# Patient Record
Sex: Female | Born: 1935 | Race: White | Hispanic: No | Smoking: Former smoker
Health system: Southern US, Community
[De-identification: ages and names within clinical notes are randomized; demographics above are authoritative.]

## PROBLEM LIST (undated history)

## (undated) DIAGNOSIS — T148XXA Other injury of unspecified body region, initial encounter: Secondary | ICD-10-CM

## (undated) DIAGNOSIS — I2510 Atherosclerotic heart disease of native coronary artery without angina pectoris: Secondary | ICD-10-CM

## (undated) DIAGNOSIS — I4821 Permanent atrial fibrillation: Secondary | ICD-10-CM

## (undated) DIAGNOSIS — D649 Anemia, unspecified: Secondary | ICD-10-CM

## (undated) DIAGNOSIS — Z9289 Personal history of other medical treatment: Secondary | ICD-10-CM

## (undated) DIAGNOSIS — H269 Unspecified cataract: Secondary | ICD-10-CM

## (undated) DIAGNOSIS — I10 Essential (primary) hypertension: Secondary | ICD-10-CM

## (undated) DIAGNOSIS — K219 Gastro-esophageal reflux disease without esophagitis: Secondary | ICD-10-CM

## (undated) DIAGNOSIS — E119 Type 2 diabetes mellitus without complications: Secondary | ICD-10-CM

## (undated) DIAGNOSIS — S62102A Fracture of unspecified carpal bone, left wrist, initial encounter for closed fracture: Secondary | ICD-10-CM

## (undated) DIAGNOSIS — Z950 Presence of cardiac pacemaker: Secondary | ICD-10-CM

## (undated) DIAGNOSIS — M1990 Unspecified osteoarthritis, unspecified site: Secondary | ICD-10-CM

## (undated) DIAGNOSIS — Z951 Presence of aortocoronary bypass graft: Secondary | ICD-10-CM

## (undated) DIAGNOSIS — I872 Venous insufficiency (chronic) (peripheral): Secondary | ICD-10-CM

## (undated) DIAGNOSIS — E785 Hyperlipidemia, unspecified: Secondary | ICD-10-CM

## (undated) DIAGNOSIS — N189 Chronic kidney disease, unspecified: Secondary | ICD-10-CM

## (undated) DIAGNOSIS — J9601 Acute respiratory failure with hypoxia: Secondary | ICD-10-CM

## (undated) DIAGNOSIS — I5033 Acute on chronic diastolic (congestive) heart failure: Secondary | ICD-10-CM

## (undated) DIAGNOSIS — E669 Obesity, unspecified: Secondary | ICD-10-CM

## (undated) HISTORY — PX: TOTAL KNEE ARTHROPLASTY: SHX125

## (undated) HISTORY — PX: APPENDECTOMY: SHX54

## (undated) HISTORY — DX: Atherosclerotic heart disease of native coronary artery without angina pectoris: I25.10

## (undated) HISTORY — DX: Anemia, unspecified: D64.9

## (undated) HISTORY — DX: Presence of aortocoronary bypass graft: Z95.1

## (undated) HISTORY — PX: BACK SURGERY: SHX140

## (undated) HISTORY — DX: Venous insufficiency (chronic) (peripheral): I87.2

## (undated) HISTORY — DX: Obesity, unspecified: E66.9

## (undated) HISTORY — DX: Permanent atrial fibrillation: I48.21

## (undated) HISTORY — PX: CATARACT EXTRACTION, BILATERAL: SHX1313

## (undated) HISTORY — DX: Chronic kidney disease, unspecified: N18.9

## (undated) HISTORY — DX: Type 2 diabetes mellitus without complications: E11.9

## (undated) HISTORY — PX: INSERT / REPLACE / REMOVE PACEMAKER: SUR710

## (undated) HISTORY — DX: Other injury of unspecified body region, initial encounter: T14.8XXA

## (undated) HISTORY — DX: Acute on chronic diastolic (congestive) heart failure: I50.33

## (undated) HISTORY — PX: TUBAL LIGATION: SHX77

## (undated) HISTORY — PX: JOINT REPLACEMENT: SHX530

## (undated) HISTORY — DX: Presence of cardiac pacemaker: Z95.0

## (undated) HISTORY — DX: Unspecified cataract: H26.9

## (undated) HISTORY — PX: TONSILLECTOMY: SUR1361

---

## 1997-07-31 DIAGNOSIS — E119 Type 2 diabetes mellitus without complications: Secondary | ICD-10-CM

## 1997-07-31 HISTORY — DX: Type 2 diabetes mellitus without complications: E11.9

## 2002-06-17 ENCOUNTER — Encounter: Payer: Self-pay | Admitting: Internal Medicine

## 2002-06-17 ENCOUNTER — Ambulatory Visit (HOSPITAL_COMMUNITY): Admission: RE | Admit: 2002-06-17 | Discharge: 2002-06-17 | Payer: Self-pay | Admitting: Internal Medicine

## 2002-06-20 ENCOUNTER — Ambulatory Visit (HOSPITAL_COMMUNITY): Admission: RE | Admit: 2002-06-20 | Discharge: 2002-06-20 | Payer: Self-pay | Admitting: Internal Medicine

## 2002-06-20 ENCOUNTER — Encounter: Payer: Self-pay | Admitting: Internal Medicine

## 2003-11-15 ENCOUNTER — Emergency Department (HOSPITAL_COMMUNITY): Admission: EM | Admit: 2003-11-15 | Discharge: 2003-11-15 | Payer: Self-pay | Admitting: Emergency Medicine

## 2003-11-15 ENCOUNTER — Inpatient Hospital Stay (HOSPITAL_COMMUNITY): Admission: AD | Admit: 2003-11-15 | Discharge: 2003-12-02 | Payer: Self-pay | Admitting: Cardiovascular Disease

## 2003-11-16 HISTORY — PX: CORONARY ANGIOPLASTY: SHX604

## 2006-06-26 ENCOUNTER — Inpatient Hospital Stay (HOSPITAL_COMMUNITY): Admission: AD | Admit: 2006-06-26 | Discharge: 2006-06-30 | Payer: Self-pay | Admitting: Internal Medicine

## 2006-06-26 ENCOUNTER — Ambulatory Visit (HOSPITAL_COMMUNITY): Admission: RE | Admit: 2006-06-26 | Discharge: 2006-06-26 | Payer: Self-pay | Admitting: Internal Medicine

## 2006-07-31 DIAGNOSIS — D649 Anemia, unspecified: Secondary | ICD-10-CM

## 2006-07-31 DIAGNOSIS — N189 Chronic kidney disease, unspecified: Secondary | ICD-10-CM

## 2006-07-31 HISTORY — DX: Chronic kidney disease, unspecified: N18.9

## 2006-07-31 HISTORY — DX: Anemia, unspecified: D64.9

## 2006-09-17 ENCOUNTER — Ambulatory Visit: Payer: Self-pay | Admitting: Pulmonary Disease

## 2006-09-17 ENCOUNTER — Encounter: Payer: Self-pay | Admitting: Emergency Medicine

## 2006-09-17 ENCOUNTER — Inpatient Hospital Stay (HOSPITAL_COMMUNITY): Admission: AD | Admit: 2006-09-17 | Discharge: 2006-09-25 | Payer: Self-pay | Admitting: Pulmonary Disease

## 2006-10-11 ENCOUNTER — Inpatient Hospital Stay (HOSPITAL_COMMUNITY): Admission: EM | Admit: 2006-10-11 | Discharge: 2006-10-17 | Payer: Self-pay | Admitting: Emergency Medicine

## 2007-05-13 ENCOUNTER — Inpatient Hospital Stay (HOSPITAL_COMMUNITY): Admission: RE | Admit: 2007-05-13 | Discharge: 2007-05-18 | Payer: Self-pay | Admitting: Orthopedic Surgery

## 2007-07-09 ENCOUNTER — Ambulatory Visit (HOSPITAL_COMMUNITY): Admission: RE | Admit: 2007-07-09 | Discharge: 2007-07-09 | Payer: Self-pay | Admitting: Ophthalmology

## 2007-07-11 IMAGING — CR DG CHEST 1V PORT
1 series · 1 of 1 positions shown · non-contrast
Comparison: none

HISTORY: CHF

[view not recorded]
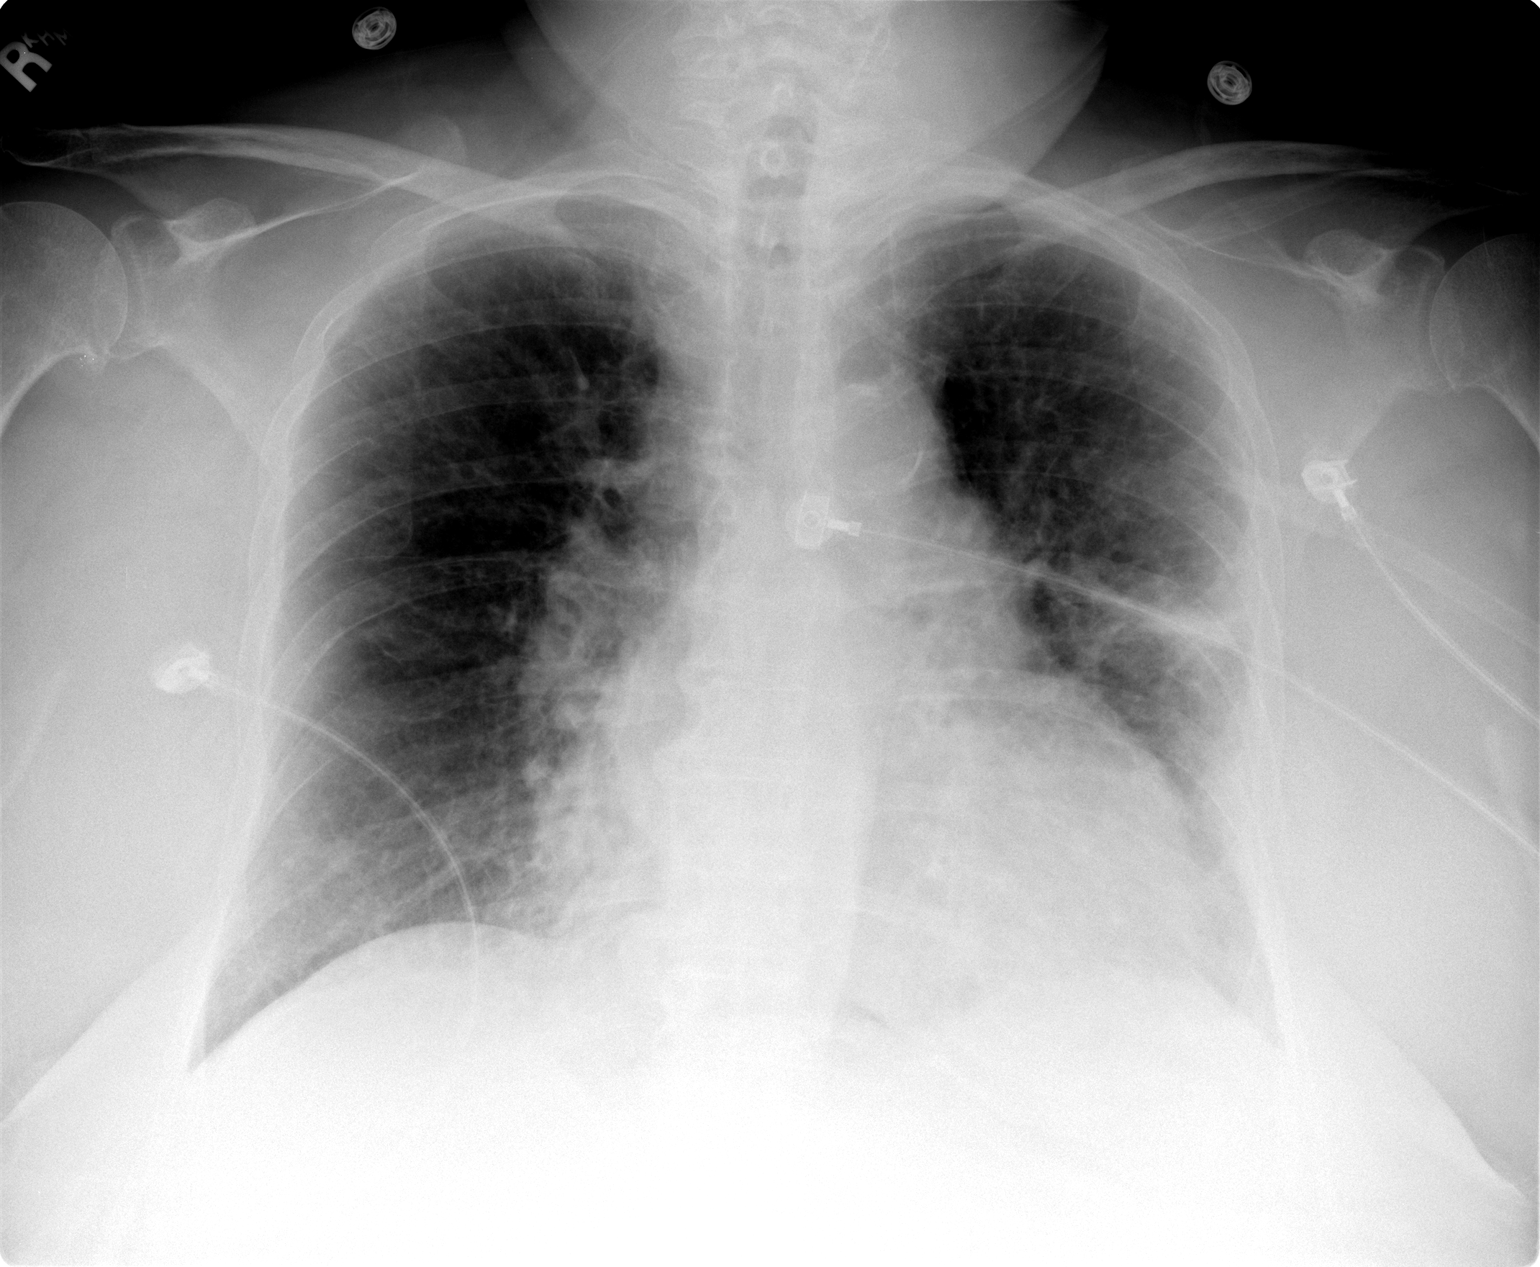

[1 of 1 positions shown; findings below may reference images not displayed]

PORTAL CHEST ONE VIEW:

Portable exam 3457 hours compared to 10/11/2006.

Heart enlargement.
Pulmonary vascular congestion.
Atelectasis left midlung.
Bronchitic changes with diffuse interstitial infiltrates, question minimal
edema.
No pleural effusion or pneumothorax.
Atherosclerotic calcification, aortic arch.
IMPRESSION: Question mild CHF.
Stable atelectasis left midlung.

## 2007-07-15 ENCOUNTER — Ambulatory Visit: Payer: Self-pay | Admitting: Internal Medicine

## 2007-07-15 ENCOUNTER — Inpatient Hospital Stay (HOSPITAL_COMMUNITY): Admission: EM | Admit: 2007-07-15 | Discharge: 2007-07-18 | Payer: Self-pay | Admitting: Emergency Medicine

## 2007-07-16 ENCOUNTER — Ambulatory Visit: Payer: Self-pay | Admitting: Internal Medicine

## 2007-08-08 HISTORY — PX: CARDIAC CATHETERIZATION: SHX172

## 2007-08-12 ENCOUNTER — Ambulatory Visit: Payer: Self-pay | Admitting: Thoracic Surgery (Cardiothoracic Vascular Surgery)

## 2007-08-28 ENCOUNTER — Emergency Department (HOSPITAL_COMMUNITY): Admission: EM | Admit: 2007-08-28 | Discharge: 2007-08-28 | Payer: Self-pay | Admitting: Emergency Medicine

## 2007-09-02 ENCOUNTER — Ambulatory Visit: Payer: Self-pay | Admitting: Thoracic Surgery (Cardiothoracic Vascular Surgery)

## 2007-09-02 ENCOUNTER — Encounter
Admission: RE | Admit: 2007-09-02 | Discharge: 2007-09-02 | Payer: Self-pay | Admitting: Thoracic Surgery (Cardiothoracic Vascular Surgery)

## 2007-10-07 ENCOUNTER — Inpatient Hospital Stay (HOSPITAL_COMMUNITY): Admission: EM | Admit: 2007-10-07 | Discharge: 2007-10-28 | Payer: Self-pay | Admitting: Emergency Medicine

## 2007-10-08 ENCOUNTER — Ambulatory Visit: Payer: Self-pay | Admitting: Thoracic Surgery (Cardiothoracic Vascular Surgery)

## 2007-10-08 ENCOUNTER — Encounter (INDEPENDENT_AMBULATORY_CARE_PROVIDER_SITE_OTHER): Payer: Self-pay | Admitting: Cardiovascular Disease

## 2007-10-17 ENCOUNTER — Encounter: Payer: Self-pay | Admitting: Thoracic Surgery (Cardiothoracic Vascular Surgery)

## 2007-10-24 DIAGNOSIS — Z951 Presence of aortocoronary bypass graft: Secondary | ICD-10-CM

## 2007-10-24 DIAGNOSIS — Z950 Presence of cardiac pacemaker: Secondary | ICD-10-CM

## 2007-10-24 HISTORY — DX: Presence of cardiac pacemaker: Z95.0

## 2007-10-24 HISTORY — PX: PACEMAKER INSERTION: SHX728

## 2007-10-24 HISTORY — PX: CORONARY ARTERY BYPASS GRAFT: SHX141

## 2007-10-24 HISTORY — DX: Presence of aortocoronary bypass graft: Z95.1

## 2007-10-28 ENCOUNTER — Inpatient Hospital Stay: Admission: AD | Admit: 2007-10-28 | Discharge: 2007-11-04 | Payer: Self-pay | Admitting: Internal Medicine

## 2007-11-02 ENCOUNTER — Emergency Department (HOSPITAL_COMMUNITY): Admission: EM | Admit: 2007-11-02 | Discharge: 2007-11-02 | Payer: Self-pay | Admitting: Emergency Medicine

## 2007-11-04 ENCOUNTER — Inpatient Hospital Stay (HOSPITAL_COMMUNITY): Admission: EM | Admit: 2007-11-04 | Discharge: 2007-11-14 | Payer: Self-pay | Admitting: Emergency Medicine

## 2007-11-04 ENCOUNTER — Ambulatory Visit: Payer: Self-pay | Admitting: Gastroenterology

## 2007-11-06 ENCOUNTER — Ambulatory Visit: Payer: Self-pay | Admitting: Gastroenterology

## 2007-11-07 ENCOUNTER — Ambulatory Visit: Payer: Self-pay | Admitting: Gastroenterology

## 2007-11-07 HISTORY — PX: ESOPHAGOGASTRODUODENOSCOPY: SHX1529

## 2007-11-14 ENCOUNTER — Inpatient Hospital Stay: Admission: AD | Admit: 2007-11-14 | Discharge: 2007-12-06 | Payer: Self-pay | Admitting: Internal Medicine

## 2007-11-19 ENCOUNTER — Encounter
Admission: RE | Admit: 2007-11-19 | Discharge: 2007-11-19 | Payer: Self-pay | Admitting: Thoracic Surgery (Cardiothoracic Vascular Surgery)

## 2007-11-19 ENCOUNTER — Ambulatory Visit: Payer: Self-pay | Admitting: Thoracic Surgery (Cardiothoracic Vascular Surgery)

## 2007-12-19 ENCOUNTER — Ambulatory Visit: Payer: Self-pay | Admitting: Thoracic Surgery (Cardiothoracic Vascular Surgery)

## 2007-12-30 ENCOUNTER — Ambulatory Visit (HOSPITAL_COMMUNITY): Admission: RE | Admit: 2007-12-30 | Discharge: 2007-12-30 | Payer: Self-pay | Admitting: Ophthalmology

## 2008-01-03 ENCOUNTER — Ambulatory Visit: Payer: Self-pay | Admitting: Internal Medicine

## 2008-01-09 ENCOUNTER — Ambulatory Visit: Payer: Self-pay | Admitting: Thoracic Surgery (Cardiothoracic Vascular Surgery)

## 2008-01-10 ENCOUNTER — Ambulatory Visit: Payer: Self-pay | Admitting: Gastroenterology

## 2008-08-09 IMAGING — CR DG CHEST 1V PORT
1 series · 1 of 1 positions shown · non-contrast
Comparison: Portable exam 4515 hours compared to 11/04/2007

CLINICAL DATA: Anemia, CHF

PORTABLE CHEST - 1 VIEW

[view not recorded]
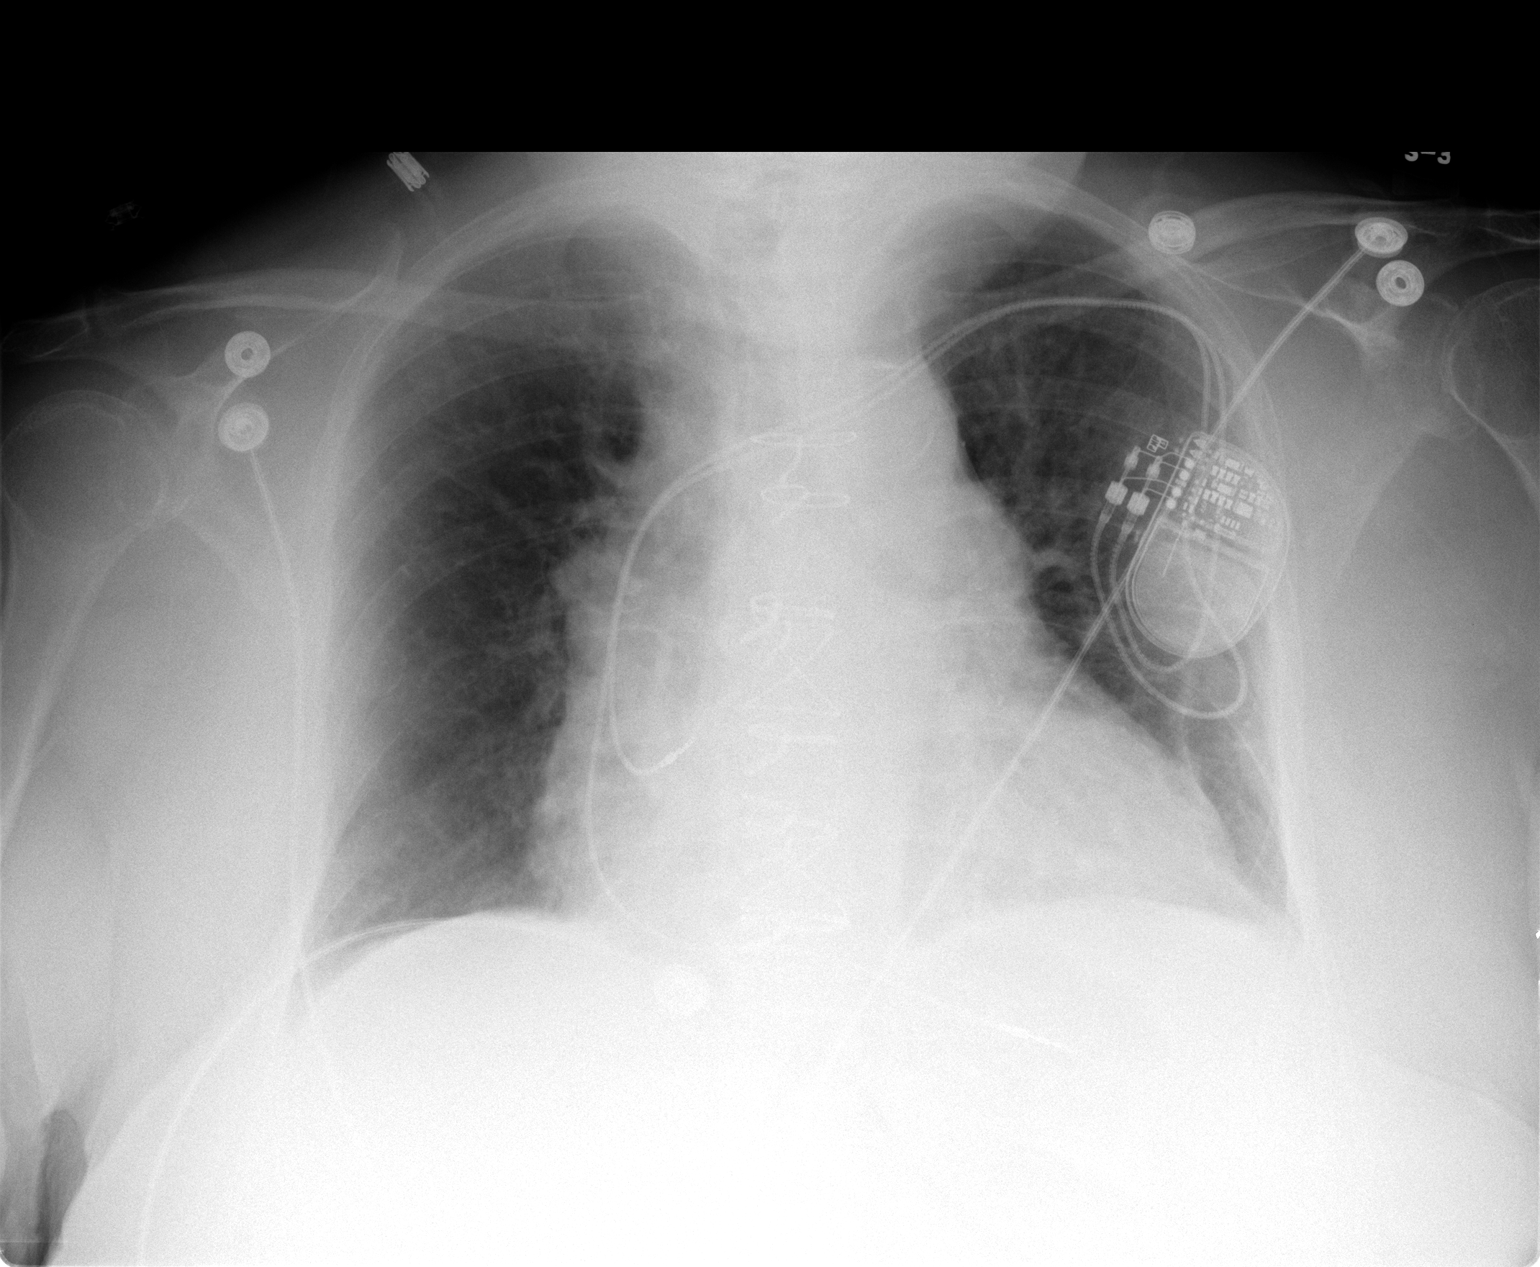

[1 of 1 positions shown; findings below may reference images not displayed]

FINDINGS: Cardiomegaly status post CABG.
Left subclavian transvenous pacemaker, leads stable, projecting
over right atrium and right ventricle.
Pulmonary vascular congestion.
Atherosclerotic calcification aortic arch.
No acute failure or consolidation.
Minimal atelectasis left base.
IMPRESSION: Cardiomegaly with pulmonary vascular congestion.
Status post CABG and pacemaker.
No definite acute pulmonary edema.

## 2008-12-01 ENCOUNTER — Ambulatory Visit (HOSPITAL_COMMUNITY): Admission: RE | Admit: 2008-12-01 | Discharge: 2008-12-01 | Payer: Self-pay | Admitting: Internal Medicine

## 2009-04-18 ENCOUNTER — Ambulatory Visit: Payer: Self-pay | Admitting: Cardiology

## 2009-07-31 HISTORY — PX: NM MYOCAR PERF WALL MOTION: HXRAD629

## 2010-08-01 ENCOUNTER — Ambulatory Visit (HOSPITAL_COMMUNITY)
Admission: RE | Admit: 2010-08-01 | Discharge: 2010-08-01 | Payer: Self-pay | Source: Home / Self Care | Attending: Internal Medicine | Admitting: Internal Medicine

## 2010-12-13 NOTE — Op Note (Signed)
NAMESURIE, SUCHOCKI              ACCOUNT NO.:  [id number]   MEDICAL RECORD NO.:  [id number]          PATIENT TYPE:  INP   LOCATION:  2020                         FACILITY:  [facility]   PHYSICIAN:  Darlin Priestly, MD  DATE OF BIRTH:  July 06, 1933   DATE OF PROCEDURE:  10/24/2007  DATE OF DISCHARGE:                               OPERATIVE REPORT   PROCEDURE:  Insertion of a Medtronic EnRhythm P1501 DR generator, serial  number [id number] with active atrial and ventricular leads.   ATTENDING:  Darlin Priestly, M.D.   COMPLICATIONS:  None.   INDICATION:  [name] is a 77 year old female patient of Dr. Franchot Heidelberg and Dr. Artis Delay with a history of hypertension, chronic  renal insufficiency, history of cardiac catheterization in January 2009  when she was found to have 3-vessel disease.  She ultimately went to  bypass surgery on October 17, 2007 by Dr. Andrey Spearman consisting of  a LIMA to the LAD, vein graft to the first diagonal, vein graft to OM,  and vein graft to distal RCA.  She did well postop, however, she  continued to require atrial pacing.  She did have some brief postop  atrial fibrillation.  Because of her need for ongoing atrial pacing, she  is now referred for dual-chamber pacemaker implant.   DESCRIPTION OF OPERATION:  After giving informed consent, the patient  was brought to the cardiac cath lab where her left chest was prepped and  draped in a sterile fashion.  Anesthesia monitoring was established.  One percent lidocaine was used to anesthetize the left mid subclavicular  area.  Next a 3 cm horizontal mid infraclavicular incision was then  carried out and hemostasis obtained with electrocautery.  Blunt  dissection was used to carry this down to the left pectoral fascia.  Next roughly a 3 x 4 cm pocket was then created over the left pectoral  fascia and again hemostasis was obtained with electrocautery.  The left  subclavian vein was then easily  entered x2 with 2 retained guidewires.  Four silk sutures were then placed at the base of the retained  guidewires into the pectoral fascia.  Over the first retained guidewire  a #7-French dilator and sheath were then easily passed in the subclavian  vein and the dilator and guidewire removed.  Through this a 52 cm active  Medtronic lead model number [id number], serial number [id number] was then  easily passed into the right atrium.  Peel-away sheath was removed.  Over the second retained guidewire a second 7-French dilator and sheath  then easily passed into subclavian vein.  The dilator and guide were  removed.  Through this a 45 cm active Medtronic lead, model number 5076,  serial number [id number] was then passed into the right atrium and peel-  away sheath was removed.  A J curve was then placed on the ventricular  lead stylet and the ventricular lead was then positioned in the RV apex.  Thresholds were then determined.  We were able to capture 2 volts and  the screw was then extended and thresholds were determined.  R-waves  were measured at 22.5 mV.  Impedance 798 ohms.  Threshold was measured  at 0.4 volts of 0.5 ms.  Current was 0.7 mA.  Ten volts was negative for  diaphragmatic stimulation.  The preformed atrial stylet was then  inserted into the atrial lead and we did do atrial mapping to find a  location for the atrial lead.  We were ultimately able to find a  suitable location with P-waves measuring approximately 3 mV.  The screw  was then extended and thresholds were determined.  P-waves were measured  at 3.2 mV.  Impedance 644 ohms.  Threshold 1 volt at 0.5 ms.  Current is  2.1 mA.  Ten volts was negative for diaphragmatic stimulation.  The  leads were then sutured into place with two silk sutures per lead,  anchoring this to the pectoral fascia.  The pocket was then copiously  irrigated with 1% kanamycin solution.  The leads were then connected in  a serial fashion to Medtronic  EnRhythm P1501 DR generator, serial number  [id number].  Head screws were tightened and pacing was confirmed.  A  single silk suture was then placed in the superior aspect of the pocket.  The generator and leads were then delivered into the pocket and the  header was secured to the silk suture.  Subcutaneous layer was then  closed with running 2-0 Vicryl.  The skin was closed with running 4-0  Vicryl.  Steri-Strips were then applied.  The patient was then  transferred to the recovery room in stable condition.   CONCLUSIONS:  Successful implant of a Medtronic EnRhythm [id number] DR  generator, serial number [id number] with active atrial and ventricular  leads.      Darlin Priestly, MD  Electronically Signed     RHM/MEDQ  D:  10/24/2007  T:  10/25/2007  Job:  [id number]   cc:   Madelin Rear. Sherwood Gambler, MD  Richard A. Alanda Amass, M.D.  Salvatore Decent Dorris Fetch, M.D.

## 2010-12-13 NOTE — Group Therapy Note (Signed)
NAMEGENNIFER, POTENZA              ACCOUNT NO.:  [id number]   MEDICAL RECORD NO.:  [id number]          PATIENT TYPE:  INP   LOCATION:  A220                          FACILITY:  [facility]   PHYSICIAN:  Gardiner Barefoot, MD    DATE OF BIRTH:  10/11/1932   DATE OF PROCEDURE:  11/08/2007  DATE OF DISCHARGE:                                 PROGRESS NOTE   SUBJECTIVE:  The patient continues to feel better.  Her heart rate has  remained stable.   PHYSICAL EXAM:  VITAL SIGNS:  Temperature is 97.6, pulse is 73 to 90,  blood pressure is 142/71, respirations 24, and O2 saturation is 96% on 1  L nasal cannula.  GENERAL:  The patient is awake, alert, and appears in no acute distress.  CARDIOVASCULAR:  Irregular rate with no murmurs, rubs, or gallops.  LUNGS:  Clear to auscultation bilaterally.  ABDOMEN:  Soft, nontender, nondistended, positive bowel sounds.  No  hepatosplenomegaly.  EXTREMITIES:  Right calf bandage from the surgery with some slight  serosanguineous drainage.   LABORATORY DATA:  BNP is 300 which is decreased from 470.  WBC is 7.4  which is decreased from 13 at admission.  Hemoglobin of 9.8.  BMET with  a creatinine of 1.64 which is increased from her admission.   ASSESSMENT AND PLAN:  1. Atrial fibrillation.  The patient is now rate controlled receiving      amiodarone as well as p.o. Lopressor and IV diltiazem.  I will      probably transition her to p.o. diltiazem or increase her p.o.      metoprolol as she weans off her IV drip.  Also, she is on Coumadin;      we will need to continue that.  2. Anemia.  She did receive some blood, and an EGD showed just mild      gastritis.  We will continue to follow her anemia.  3. Hypotension, resolved.  4. Acute renal insufficiency.  We appreciate nephrology input.      Creatinine has elevated mildly likely secondary to Lasix.  This has      been stopped, and now, the patient is being transitioned to      [facility].  5. Status post coronary  artery bypass grafting.  The patient will have      wound care take a look at her right leg.      Gardiner Barefoot, MD  Electronically Signed     RWC/MEDQ  D:  11/08/2007  T:  11/09/2007  Job:  [id number]

## 2010-12-13 NOTE — Consult Note (Signed)
[name], [name]              ACCOUNT NO.:  [id number]   MEDICAL RECORD NO.:  [id number]          PATIENT TYPE:  INP   LOCATION:  2904                         FACILITY:  [facility]   PHYSICIAN:  Salvatore Decent. Dorris Fetch, M.D.DATE OF BIRTH:  January 16, 1933   DATE OF CONSULTATION:  10/08/2007  DATE OF DISCHARGE:                                 CONSULTATION   REASON FOR CONSULTATION:  Three-vessel disease.   HISTORY OF PRESENT ILLNESS:  [name] is a 77 year old woman with a  history of coronary disease.  She was seen in consultation by Dr. Cornelius Moras  previously, but surgery was delayed on two occasions she now presents  with rapid atrial fibrillation and congestive heart failure.  Her main  complaint was weakness.   [name] is a 77 year old woman with a history of coronary disease  that dates back to November 06, 2003 when she was admitted with acute  coronary syndrome.  She had non drug-eluting stents placed in the right  coronary at that time.  She was also noted to be in atrial fibrillation  at that time.  She was anticoagulated initially, but her atrial  fibrillation resolved, and she did not continue on the Coumadin beyond  that.  She underwent diagnostic catheterization in January of this year  where she was found to have severe three-vessel coronary disease.  She  referred for coronary bypass grafting.  Her ejection fraction was  approximately 45% with inferior and anterolateral wall hypokinesis.  She  was seen in consultation by Dr. Cornelius Moras and advised to have coronary bypass  grafting.  However, she refused at that time because her husband was ill  and in the hospital.  She subsequently was seen back in the office in  February and scheduled for surgery at that time.  However, she developed  bronchitis, and surgery had to be postponed once again.  She now  presents back with complaint of generalized weakness and malaise.  She  was seen in the office by Dr. Susa Griffins yesterday and  was found  to be in rapid atrial fibrillation and congestive heart failure.  She  was admitted, started on heparin, as well as diltiazem for rate control  of her atrial fibrillation.  Her cardiac enzymes were negative.   Her past medical history is significant for:  1. Coronary artery disease.  2. Type 2 diabetes.  3. Hypertension.  4. Chronic renal failure stage IV.  5. Exogenous obesity.  6. Hyperlipidemia.  7. Remote transient ischemic attack 6 to 7 years ago.  8. Chronic obstructive pulmonary disease with a past history of two      packs per day smoking.  9. Sick sinus syndrome.  10.Hypothyroidism.  11.Obesity.   MEDICATIONS AT THE TIME OF ADMISSION:  1. Lantus 30 units daily.  2. Glipizide 10 mg b.i.d.  3. Lasix 40 mg b.i.d.  4. Potassium chloride 20 mEq daily.  5. Metoprolol ER 50 mg daily.  6. Hydralazine 50 mg 1 t.i.d.  7. Imdur 30 mg daily.  8. Clonidine 1 mcg b.i.d.  9. Simvastatin 40 mg daily.  10.Temazepam 30 mg  every night.  11.Synthroid 50 mcg daily.   She has no known drug allergies.   FAMILY HISTORY:  Noncontributory.   SOCIAL HISTORY:  She is married.  Her husband is in poor health.  She  does have a past history of heavy tobacco abuse about two packs a day  for 50 years.   REVIEW OF SYSTEMS:  The patient denies any chest pain but has had  swelling in her legs.  He had mild orthopnea.  Main complaint is  generalized weakness and malaise.  She has not had any recent stroke or  TIA-type symptoms.  She has gained weight over the past several weeks  after having lost many pounds over the past few months.  She had not  noted any change in the frequency of urination or the volume.   PHYSICAL EXAMINATION:  [name] is an obese, 77 year old female in no  acute distress.  Blood pressure 130/70, pulses 70-80 - atrial  fibrillation, respirations are 20.  GENERAL:  She is well-developed but obese.  NEUROLOGICALLY:  She is alert and oriented x3 with no focal  deficits.  HEENT EXAM:  She is wearing glasses, otherwise unremarkable.  NECK:  Is supple without thyromegaly, adenopathy, or bruits.  CARDIAC EXAM:  Is irregularly irregular.  There is no significant  murmur.  LUNGS:  Have crackles at the basis.  ABDOMEN:  Soft, nontender.  EXTREMITIES:  Are edematous, right greater than left.  She has well-  healed surgical scar in the right knee.  She has palpable pulses in her  right leg, faintly questionable palpable pulse in the left leg.   LABORATORY DATA:  White count 7.2, platelets 177, hematocrit 29.  Sodium  136, potassium 4.1, CO2 20, BUN and creatinine are 47 and 2.3, glucose  is 190 this morning.  PT was 15.5 with an INR of 1.2, PTT was 75,  heparin level 0.59.  The liver function testing within normal limits.  Albumin is 3.2.  CK was 70, MB 1.7, troponin 0.02.  Cholesterol 105, LDL  59, HDL 23, triglycerides 117.  Urinalysis is not yet been performed.  Her CEA level is pending.  BNP was 637.  Glycosylated hemoglobin 6.3.  TSH 2.37.   IMPRESSION:  [name] is a 77 year old woman with severe three-vessel  coronary disease.  She now presents with rapid atrial fibrillation and  congestive heart failure.  She was only mildly symptomatic with her main  complaint being weakness.  She needs coronary artery bypass grafting for  revascularization.  Atrial fibrillation raises an interesting question  of whether or not a concomitant Maze would be appropriate.  However,  given her advanced age and multiple comorbidities and recent worsening  of her renal function, I do not believe that the extra bypass time  inherent in the Maze procedure is justified given this is a relatively  acute onset short duration.  She did have an episode in the past that  was of short duration.   She is a high-risk surgical candidate due to COPD, renal failure,  diabetes, obesity, congestive heart failure, and a relatively recent  history of gastrointestinal  bleeding.   I would estimate her operative mortality in the 5-10% range.  She also  is at risk for stroke, MI, DVT, PE, bleeding, possible need for  transfusions, infections as well as other organ system dysfunction  including GI, respiratory, and renal dysfunction.  If she were to  develop postoperative renal failure which is a real possibility  requiring dialysis, her risk of other complications would increase  dramatically.  She understands and accepts these risks.  Her  medication regimen has been changed by Dr. Alanda Amass.  We will recheck  her creatinine in the morning with an eye towards coronary bypass  grafting later in the week, if the patient remains stable and hopefully  shows some improvement.  I will see the patient again tomorrow.      Salvatore Decent Dorris Fetch, M.D.  Electronically Signed     SCH/MEDQ  D:  10/08/2007  T:  10/09/2007  Job:  [id number]

## 2010-12-13 NOTE — Consult Note (Signed)
NAMEMODINE, OPPENHEIMER              ACCOUNT NO.:  [id number]   MEDICAL RECORD NO.:  [id number]          PATIENT TYPE:  INP   LOCATION:  A220                          FACILITY:  [facility]   PHYSICIAN:  Jorja Loa, M.D.DATE OF BIRTH:  Jul 05, 1933   DATE OF CONSULTATION:  11/05/2007  DATE OF DISCHARGE:                                 CONSULTATION   REASON FOR CONSULTATION:  Elevated BUN and creatinine.   [name] is a 77 year old Caucasian female who has history of COPD,  diabetes, and history of hypertension who presently came to the  emergency room with complaints of weakness.  When she was evaluated in  the emergency room the patient was found to have a significant low  hemoglobin of 7.9 and with hypertension admitted to the hospital for  further workup and management.  According to [name] she has been at  [facility] simply for coronary artery disease, CABG, and during  that time she was told she had problem with her kidneys and being  followed by [facility].  According to her the etiology of her  renal insufficiency is not clear, and she denies any previous history of  renal insufficiency.  According to the information she has her renal  function has been improving.  At this moment she seems to be feeling  better.  She denies any dizziness, lightheadedness.   PAST MEDICAL HISTORY:  She has history of COPD, history of recurrent  CHF, history of diabetes, history of hypertension, history of atrial  fibrillation, history of anemia, history of hypothyroidism, history of  dyslipidemia, history of obesity, history of pneumonia, renal  insufficiency, and history of TIA.   PAST SURGICAL HISTORY:  She has history of carotid arteries status post  CABG in March 2009, history of knee replacement, history of pacemaker  insertion, history of back surgery, history of appendectomy, and also  history of stent placement.   ALLERGIES:  No known allergy.   MEDICATIONS:   At this moment consists of:   1. Lasix 20 mg IV once a day.  2. Protonix 40 mg IV q.24 h.  3. She is getting normal saline at 110 cc/hour.  4. Tylenol and other medications are p.r.n. medications.  However, as      an outpatient has been on multiple medications including Synthroid,      glipizide, Norvasc, Catapres, Coumadin, Cozaar.   SOCIAL HISTORY:  No history of smoking or alcohol abuse.  Recently sent  to [facility] for rehabilitation.   FAMILY HISTORY:  No history of renal insufficiency.   REVIEW OF SYSTEMS:  Complaints of some weakness; otherwise, states okay.  She does not have any nausea, vomiting or dizziness or lightheadedness.  She does not have any chest pain.  She says that she has swelling of the  legs for some time.  According to her the swelling seems to be also  improving.   PHYSICAL EXAMINATION:  GENERAL:  The patient is alert, sitting up in no  apparent distress.  VITAL SIGNS:  Temperature 97.8, pulse 138, blood pressure 170/100,  respiratory rate 20.  HEENT:  No conjunctivae, no icterus.  NECK:  Supple, no JVD.  CHEST:  Clear to auscultation, no rales, no rhonchi, no egophony.  HEART:  Reveals regular rate and rhythm, no murmur, no S3.  ABDOMEN:  Soft, no palpable organomegaly, positive bowel sounds.  EXTREMITIES:  She has probably 2 to 3+ edema.   LABORATORY DATA:  Last shift, urine output is  about 800.  White blood  cell count 8.1, hemoglobin two days 9.6, hematocrit 28.7,  28.8,  yesterday hemoglobin was as low as 7.7 and hematocrit 22.7.  INR 1.2, PT  is 16.  Sodium 132, potassium 3.9, BUN 64, creatinine 1.91.  On March 31  when she was discharged from [facility] her creatinine was about as low  as 1.3, and she had another blood work April 4.  Creatinine was 0.87.  Calcium 8.6.  Phosphorus 5.2.  At this moment no albumin.  She has iron  studies which were done.  Iron saturation is 6%.  At this moment  ferritin is not available.  During her previous  workup she had also PTH  done which was 164.9.  This is from March 11 when the culture was 8.7.  She had protein electrophoresis and spike was not detected, but she has  an elevated alpha 2 protein.  Otherwise, no additional information is  available.   She had an ultrasound which was done during that time which showed right  kidney to be 11.8, left kidney 11.4 with some decreased in cortical  medullary differentiation and with some atrophy.   ASSESSMENT:  1. Renal insufficiency.  At this moment seems to be probably acute on      chronic.  The etiology of her renal insufficiency not clear, but in      a patient with coronary artery disease status post CABG, ischemic      nephropathy needs to be entertained and most likely etiology.      Since patient has longstanding history of diabetes, hypertension,      hypertensive nephrosclerosis and diabetic nephropathy also need to      be entertained.  Since the patient has elevated phosphorus,      elevated intact PTH with atrophic kidney last creatinine which was      normal probably may be an error.  However, since she has      significant anasarca hemodilutionalso __________ with low      creatinine during her recent evaluation; hence probably patient      might have essentially chronic renal insufficiency.  2. Anasarca very significant __________ probably hypoalbuminemia.  May      place on __________ and also probably from her cardiac  problem as      patient has history of congestive heart failure.  3. Hypertension.  The patient on multiple medications as an      outpatient.  Her blood pressure at this point seems to be stable.  4. History of diabetes.  She is on insulin.  5. History of chronic obstructive pulmonary disease all in all stable.  6. History of anemia.  Seems to be secondary to iron deficiency      anemia.  Patient is  not on iron .  Hemoglobin and hematocrit have      dropped.  She is status post blood transfusion.  7.  History of atrial fibrillation.  8. History of hypothyroidism.  She is possibly on Synthroid.  9. History of coronary artery disease status post CABG, and also  she      has pacemaker in place.  10.History of transient ischemic attack.   RECOMMENDATIONS:  At this moment will increase her IV Lasix to improve  her urine output since she has significant edema.  Will check also her  albumin.  Probably patient may benefit from being started on iron  supplement.  If etiology for her iron deficiency anemia is not clear  checking also for guaiac and probably doing  either colonoscopy or an upper GI may be reasonable.  We will continue  with __________ and will continue the other treatment as soon as patient  has ultrasound and her previous workup and no further blood workup is  required and once the condition improves patient may need to be returned  to her nephrologist.      Jorja Loa, M.D.  Electronically Signed     BB/MEDQ  D:  11/05/2007  T:  11/05/2007  Job:  [id number]

## 2010-12-13 NOTE — Discharge Summary (Signed)
[name], [name]              ACCOUNT NO.:  [id number]   MEDICAL RECORD NO.:  [id number]          PATIENT TYPE:  INP   LOCATION:  2020                         FACILITY:  [facility]   PHYSICIAN:  [name] Decent. Dorris [name], M.D.DATE OF BIRTH:  [birth year]   DATE OF ADMISSION:  10/07/2007  DATE OF DISCHARGE:  10/28/2007                               DISCHARGE SUMMARY   ADDENDUM:   [name] is scheduled for transfer to [name]  today.  Since her previously dictated discharge summary, she has had  some medication changes.  Her blood pressure has been trending upward  and she has been seen by Cardiology with titration of her medications.  She has also been started on low-dose Coumadin for history of atrial  fibrillation and her INR at the time of discharge is 1.1.  She is  otherwise doing very well.  Morning labs on postop day #11 show  hemoglobin 9.1, hematocrit 27.4, white count 8.9, platelets 302,000,  sodium 141, potassium 3.8, BUN 27, creatinine 1.3, PT 14.1, INR 1.1.  Her incisions are healing well and every other leg staple has been  discontinued.  She is otherwise stable and ready for transfer today.   DISCHARGE MEDICATIONS:  Unchanged from the previously dictated discharge  summary with the exception of these additions:  1. Avapro 150 mg daily.  2. Hydrochlorothiazide 12.5 mg one-half tablet daily.  3. Coumadin 2.5 mg daily.  4. Lopressor 25 mg b.i.d.  5. The clonidine has been discontinued.   The rest or medications are unchanged.   ADDITIONAL DISCHARGE INSTRUCTIONS:  The remainder of her leg staples are  to be discontinued in 1 week.  She will also need a PT and INR drawn  within 48 hours of her admission with the results to be called to  [name] for management of her anticoagulation;  this is extremely important, as she does not  need to have a supratherapeutic INR, since she is status post recent  surgery.  Her INR should be  around 2 to 2.5.  The remainder of her  discharge instructions and followups are unchanged from the previously  dictated discharge summary.      [name], P.A.      [name], M.D.  Electronically Signed    GC/MEDQ  D:  10/28/2007  T:  10/28/2007  Job:  [id number]   cc:   [name], M.D.  [name]

## 2010-12-13 NOTE — Op Note (Signed)
[name], [name]              ACCOUNT NO.:  [id number]   MEDICAL RECORD NO.:  [id number]          PATIENT TYPE:  INP   LOCATION:  2899                         FACILITY:  [facility]   PHYSICIAN:  Mila Homer. Sherlean Foot, M.D. DATE OF BIRTH:  Mar 23, 1933   DATE OF PROCEDURE:  05/13/2007  DATE OF DISCHARGE:                               OPERATIVE REPORT   SURGEON:  Mila Homer. Sherlean Foot, M.D.   ASSISTANT:  Arnoldo Morale, PA.   ANESTHESIA:  General.   PREOPERATIVE DIAGNOSIS:  Right knee osteoarthritis.   POSTOPERATIVE DIAGNOSIS:  Right knee osteoarthritis.   PROCEDURE:  Right total knee arthroplasty.   INDICATIONS FOR PROCEDURE:  The patient is 42, with failure of  conservative measures for osteoarthritis of the right knee.  Informed  consent was obtained.   DESCRIPTION OF PROCEDURE:  The patient was laid supine and administered  general anesthesia.  The right leg was prepped and draped in usual  sterile fashion.  The leg was exsanguinated with the Esmarch tourniquet  and inflated to 350 mmHg, and set for an hour.  I then placed the leg in  extension, made a midline incision approximately 7 inches in length with  a #10 blade.  I Used a fresh blade to make a median parapatellar  arthrotomy and perform a synovectomy.  I elevated the deep MCL off the  medial crest of the tibia, around the semi-membranosus tendon.  I then  went into flexion, everted the patella through the measured 23 mm thick.  I reamed down to 16 mm.  I drilled through lug holes and when the  prosthetic trial in place, we created the 23 mm thickness.  I then used  the extramedullary alignment system on the tibia to make a perpendicular  cut to the anatomic axis of the tibia.  I then used the intramedullary  guide on the femur with the distal femoral guide set on 6 degrees, and  made the distal femoral cut with a sagittal saw.  I sized to a size E on  the femur, pinned it in 3 degrees of external rotation, and made the  anterior,  posterior and chamfer cuts through the 4-n-1 cutting block.  I  then placed a lamina spreader in the knee; cleaned out the ACL, PCL,  medial and lateral menisci, posterior condylar osteophytes.  I then  placed a 12 mm spacer block in the knee and had good flexion/extension  gap balance.  I finished the femur with a size E finishing block; placed  the tibia with a size 4 tibial tray drilling keel.  I then trialed with  a size 4 tibia, size E femur, 12 insert and 32 patella.  I had good  flexion/extension gap balance.  I dropped and dangled back to 120  degrees with limited only __________  ratio.  The patella tracked well.   I then removed the trial components and irrigated copiously.  I cemented  in the components, removing excess cement and allowing the cement to  harden in extension.  I placed a Hemovac coming out through  superolaterally deep to the arthrotomy, and  a pain catheter coming out  superomedially and superficial to the arthrotomy.  I also infiltrated  the capsule with 20 mL of 0.25% Marcaine with epinephrine and morphine  mixture.  I then closed the arthrotomy with figure-of-eight #1 Vicryl  sutures.  After the cement was hardened the  tourniquet was deflated and  hemostasis obtained.  I closed the deep soft tissues with buried 0  Vicryl, the subcuticular with 3-0 Vicryl stitch and skin staples.  I  then dressed with Xeroform dressing sponges, sterile Webril and TED  stocking.   COMPLICATIONS:  None.   DRAINS:  None.           ______________________________  Mila Homer. Sherlean Foot, M.D.     SDL/MEDQ  D:  05/13/2007  T:  05/13/2007  Job:  [id number]

## 2010-12-13 NOTE — H&P (Signed)
NAME:  [name], [name]              ACCOUNT NO.:  [id number]   MEDICAL RECORD NO.:  [id number]          PATIENT TYPE:  AMB   LOCATION:  DAY                           FACILITY:  [facility]   PHYSICIAN:  R. Roetta Sessions, M.D. DATE OF BIRTH:  1933/02/15   DATE OF ADMISSION:  01/03/2008  DATE OF DISCHARGE:  LH                              HISTORY & PHYSICAL   CHIEF COMPLAINT:  Microcytic anemia.  No prior colonoscopy.   [name] is a pleasant 77 year old Caucasian female with  multiple medical problems who has had a microcytic anemia for several  months now although as far as I can tell she has never been documented  to have Hemoccult-positive stools and clinically has not had any  bleeding.  She has been transfused on multiple occasions.  Earlier this  year she was found to have tight multivessel coronary artery disease by  Dr. Alanda Amass and subsequently underwent coronary bypass grafting back  in March.  She subsequently underwent a pacemaker placement.   She has been maintained on Coumadin for atrial fibrillation.  She got  through those procedures well.  She was admitted to [facility]  back in early April with acute-on-chronic anemia.  She was seen by Dr.  Cira Servant.  She did undergo an EGD during the hospitalization, which  revealed a noncritical Schatzki's ring and pretty much otherwise normal  upper GI tract.  H. pylori serologies were ordered due to erythema  present in the stomach, but I do not have those results on the chart for  review at this time.  She has never had a colonoscopy.  I spoke to Dr.  Alanda Amass prior to her CABG and we were going to get her revascularized  and then bring her back at some point postop for her first-ever  colonoscopy.  She has had multiple Hemoccult-negative stools.  There is  no family history of colorectal neoplasia or polyps.  She has occasional  constipation, for which she takes MiraLax at bedtime.   PAST MEDICAL HISTORY:  Type 2  insulin-dependent diabetes mellitus for 10  years, has bilateral cataracts, atrial fib requiring pacemaker March  2009.   PAST SURGERIES:  Back surgery, CABG, appendectomy, right knee  replacement October or November 2008.   HOME MEDICATIONS:  Glipizide 10 mg daily, levothyroxine 125 mcg daily,  Crestor 10 mg daily, Lexapro 10 mg daily, ASA 325 mg daily, Prilosec 20  mg daily, Coumadin 5.5 mg daily, Colace 100 mg daily, amiodarone 400 mg  daily, hydralazine 25 mg t.i.d., Lopressor 75 mg b.i.d., MiraLax 17  grams every other day, K-Dur 20 mEq twice daily, Zaroxolyn 5 mg b.i.d.,  Lantus 23 units in the morning, Diltiazem 120 mg daily, Demadex 60 mg  b.i.d., Nu-Iron 150 mg daily, NovoLog sliding scale   ALLERGIES:  No known drug allergies.   FAMILY HISTORY:  No chronic GI or liver illness.  No history of colon  cancer.   SOCIAL HISTORY:  Patient married for 53 years, 1 child, mentally  challenged, lives at home.  No tobacco or alcohol.   REVIEW OF SYSTEMS:  No recent chest pain, dyspnea on exertion.  No fever  or chills.   PHYSICAL EXAMINATION:  GENERAL:  A pleasant 75-year lady, resting  comfortably.  VITAL SIGNS:  Weight 217, height 5'4, temperature 98, blood pressure  112/78, pulse 64.  SKIN:  Warm and dry.  There is no jaundice.  HEENT:  No scleral icterus.  CHEST:  Lungs are clear to auscultation.  CARDIAC:  Regular rate and rhythm without murmur, gallop or rub.  ABDOMEN:  Obese.  Positive bowel sounds.  Soft, nontender.  EXTREMITIES:  Trace lower extremity edema.   IMPRESSION:  [name] is a pleasant 77 year old lady who has a  notable microcytic anemia, has required multiple transfusions over the  past several months but has yet to be documented to be Hemoccult  positive.  She has never had a colonoscopy.  Findings of recent EGD are  reassuring.   I feel she ought to get over her cataract surgery by a couple more weeks  and then we will go ahead and set her  up for a colonoscopy.  We  discussed this approach with [name].  Risks, benefits and  alternatives have been reviewed, questions answered.  She came off  Coumadin for 3 days for her cataract surgery and did well.  I will  actually plan to go ahead and stop her Coumadin for 3 days prior to  colonoscopy as well.  This approach has been discussed with the patient.  We will also go ahead and give her a single dose of prophylactic  antibiotics prior to the colonoscopy given that her prosthetic knee is  less than 30 year old.  We will make further recommendations in the very  near future.      Jonathon Bellows, M.D.  Electronically Signed     RMR/MEDQ  D:  01/03/2008  T:  01/03/2008  Job:  [id number]   cc:   Gerlene Burdock A. Alanda Amass, M.D.  Fax: [phone]   Madelin Rear. Sherwood Gambler, MD  Fax: [phone]

## 2010-12-13 NOTE — Assessment & Plan Note (Signed)
OFFICE VISIT   [name], [name]  DOB:  08/01/1932                                        January 09, 2008  CHART #:  [id number]   REASON FOR FOLLOWUP:  Followup of leg wound.   HISTORY OF PRESENT ILLNESS:  This patient is a 77 year old woman who had  coronary bypass graft back on 10/17/2007.  She developed a wound  infection in her right leg at the saphenous vein site.  This was open  and then packed.  She was last seen in the office about 3 weeks ago and  she was still requiring packing, although the infection resolved.  She  says it has been getting smaller and had been less and less packing in  with each visit of the home health nurse.  She is not having any pain in  the area, and otherwise, she is doing well.   PHYSICAL EXAMINATION:  Her leg wound has a small opening only 2-3 mm in  diameter.  A Q-tip did go in approximately 7-8 mm.  The wound was  packed.   PLANS:  She will continue with packings.  Continue with the home health  nurse and then she will call us back if this thing has not healed in  about a month or so.   Salvatore Decent Dorris Fetch, M.D.  Electronically Signed   SCH/MEDQ  D:  01/09/2008  T:  01/10/2008  Job:  [id number]

## 2010-12-13 NOTE — Assessment & Plan Note (Signed)
OFFICE VISIT   [name], [name]  DOB:  [birth year]                                        November 19, 2007  CHART #:  [id number]   HISTORY OF PRESENT ILLNESS:  The patient is status post coronary artery  bypass grafting x4 10/17/07.  The patient's postoperative course was  complicated with atrial fibrillation as well as sick sinus syndrome and  acute on chronic anemia.  The patient eventually undergoing placement of  total chamber permanent pacemaker on 10/24/07 by Dr. Shirlee Latch.  Patient  wants to receive transfusion while in the hospital.  She eventually  stabilized, started on Coumadin therapeutic and was ready for discharge  home.  The patient has not felt care at home was sufficient and requires  skilled nursing facility.  [facility] in [place] was arranged  and the patient was transferred there 10/28/07.  She presents today for  her postoperative visit.  The patient is still in [facility]  in [place].  She states that she had to be readmitted to [facility] 2  weeks ago due to acute on chronic anemia.  She received a transfusion at  that time.  She also states during this admission she was told that she  would need to see a kidney doctor as an outpatient for further  management.  No further details were available.  She was discharged back  to [facility] about a week and half ago.  The patient feels  that she is doing well.  She denies any sternal pain shortness of  breath, nausea, vomiting.  She states she is ambulating 2-3 x per day.  Her appetite is progressing well.  There is a small amount of serous  drainage from her distal right EVH site.  She had been started on  ciprofloxacin for this and dressing changes are being done.   PHYSICAL EXAM:  VITALS:  Blood pressure 132/76, pulse is 62.  Respirations 18, O2 sat 95% on room air.  RESPIRATORY:  The patient has crackles on bilateral bases.  CARDIAC:  Irregularly  irregular.  ABDOMEN:  Soft.  Bowel sounds x4.  EXTREMITIES:  Significant amount of edema noted bilateral peripheral  lower extremities.  She does have some serous drainage from her distal  right incision site.  Small amount of erythema surrounding.   The patient had PMI and chest x-ray done today which is clear.   IMPRESSION AND PLAN:  The patient was seen and evaluated by Dr.  Dorris Fetch.  Still the patient is progressing well but no significant  edema.  Recommend decreasing her Diltiazem to 120 mg daily and  increasing her Demadex to 60 mg twice a day.  The patient is told to  keep legs elevated while sitting and laying.  She is to continue wearing  her TED hose.  The patient is to continue ambulating 3-4 times per day.  Will plan to see her back in 3 weeks.  In the meantime, she does have a  follow-up appointment with Dr. Alanda Amass on May 18.   Salvatore Decent Dorris Fetch, M.D.  Electronically Signed   KMD/MEDQ  D:  11/19/2007  T:  11/19/2007  Job:  [id number]   cc:   Theda Belfast, PA  Salvatore Decent Dorris Fetch, M.D.

## 2010-12-13 NOTE — Group Therapy Note (Signed)
[name], [name]              ACCOUNT NO.:  [id number]   MEDICAL RECORD NO.:  [id number]          PATIENT TYPE:  INP   LOCATION:  A220                          FACILITY:  [facility]   PHYSICIAN:  Gardiner Barefoot, MD    DATE OF BIRTH:  03-07-1933   DATE OF PROCEDURE:  11/09/2007  DATE OF DISCHARGE:                                 PROGRESS NOTE   SUBJECTIVE:  The patient reports she continues to feel better.  The  heart rate has remained stable.   OBJECTIVE:  VITAL SIGNS:  Temperature is 97.5, pulses ranged from 58-90,  respirations 20, blood pressures ranged from 131-143/64-74.  O2  saturations 95% on 1 liter.  GENERAL:  The patient is awake and alert, and oriented x3, and appears  in no acute distress.  CARDIOVASCULAR:  Irregularly irregular, with no murmurs, rubs, or  gallops appreciated.  LUNGS:  Clear to auscultation bilaterally.  ABDOMEN:  Soft, nontender, nondistended, with positive bowel sounds, and  no hepatosplenomegaly.  Obese.  EXTREMITIES:  Right calf surgical wound with serosanguineous drainage.  Otherwise, nontender, and no erythema.   LABORATORY DATA:  INR is 1.2.  Sodium 139, potassium 3.6, chloride 109,  bicarbonate 23, glucose 165, BUN 36, and creatinine is 1.62.   ASSESSMENT AND PLAN:  1. Atrial fibrillation.  The patient is being anticoagulated for      cardiology with heparin drip and Coumadin per protocol.  Otherwise,      her ventricular rate has remained stable, with a stable pulse.  At      this time, I think she can wean from the Cardizem drip, and we will      increase her metoprolol dose from 25 to 37.5 mg q.8 h., and wean      her drip as tolerated for her heart rate.  However, we will restart      the drip if she begins to have any significant tachycardia.  2. Acute renal insufficiency.  Creatinine today is stable, and per      nephrology the patient has been started on metolazone 2.5 mg.  The      patient's Lasix for diuresis has been stopped, and  the patient was      started on metolazone and Demadex.  3. Coronary artery disease.  The patient is status post CABG and is      otherwise stable.  We will continue with her medications.  4. Leg wound.  There are no signs at this time that the leg is      infected.  Although there is a wound culture with a positive      organism, this likely does not necessarily represent an infection.      The patient has been afebrile, and otherwise the leg has been      stable.  However if she continues to have drainage, we will relook      at the leg.      Gardiner Barefoot, MD  Electronically Signed    RWC/MEDQ  D:  11/09/2007  T:  11/09/2007  Job:  [id number]

## 2010-12-13 NOTE — Consult Note (Signed)
[name], ANGE              ACCOUNT NO.:  [id number]   MEDICAL RECORD NO.:  [id number]          PATIENT TYPE:  INP   LOCATION:  2904                         FACILITY:  [facility]   PHYSICIAN:  Terrial Rhodes, M.D.DATE OF BIRTH:  1933/05/17   DATE OF CONSULTATION:  10/08/2007  DATE OF DISCHARGE:                                 CONSULTATION   REASON FOR CONSULTATION:  Abnormal creatinine value.   Ms. Hoppe is a 77 year old white female with atrial fibrillation ,  chronic kidney disease, hypertension, hyperlipidemia, hypothyroidism,  admitted to [facility] for control of atrial fib and evaluation for  CABG.  Her labs today revealed she had an increase in baseline  creatinine with a history of creatinine trending up.  She denied nausea,  vomiting, diarrhea, shortness of breath, chest pain, neuralgia, fever,  chills, back pain, dysuria, frequency, or urgency.  No hematuria.  No  abdominal pain.  She states that she had urinated twice this a.m.  without difficulty.  She is eating okay, and at the time of consult she  was not receiving IV fluids, but had IV on.   PAST MEDICAL HISTORY:  Significant for coronary artery disease with  stent placement, hypertension, systemic, with normal renal arteries x10  years, diabetes type 2 x10 years with questionable retinopathy, chronic  kidney insufficiency, hyperlipidemia, TIAs 6-7 years ago without  recurrence, stable COPD, hypothyroidism, sick sinus syndrome with  recurrent AFib with rate-related heart failure, history of anemia and  knee replacement in October 2008.   SOCIAL HISTORY:  She lives with her family.  She has a husband and 1  child that she lives with.  She is married, has 4 children.  She quit  smoking in 2001.  No alcohol use, no drug use.   FAMILY HISTORY:  Mother is deceased from Hodgkin's lymphoma.  Father is  deceased from a stroke in his 16s, and she has 4 healthy siblings.   REVIEW OF SYSTEMS:  Significant for cataract  in both eyes, dyspnea on  exertion, orthopnea, edema in the right leg a little bit more than the  left, and claudication.  All other systems were negative.   MEDICATIONS:  At home she takes Lantus 30 mg daily,  Glipizide 10 mg  b.i.d., Lasix 40 mg b.i.d., Toprol ER 50 mg daily, potassium chloride 20  mEq daily, Hydralazine 50 mg t.i.d., Isosorbide dinitrate 1/2 tab daily,  clonidine hydrochloride 0.1 mg b.i.d., Simvastatin 40 mg daily,  temazepam 30 mg at night, Synthroid 80 daily.   In the hospital she is on Cardizem 10 mg IV, Hydralazine 50 mg, Lexapro  10 mg, aspirin 355 mg, Simvastatin 40 mg, Glipizide 10 mg b.i.d.,  Protonix 20 mEq b.i.d., metoprolol 25 mg t.i.d., heparin protocol, and  Lasix 40 mg once daily.   PHYSICAL EXAMINATION:  Her temperature was 97.3.  Her pulse is 98.  Respiratory rate 22.  Blood pressure 134/72.  O2 sat was 99% on nasal  cannula 2 liters.  Weight is 103.5 kilograms.  Height is 64 inches.  GENERAL:  She is a well-developed, well-nourished, obese 77 year old  white female  resting, in no acute distress.  HEENT:  Normocephalic, atraumatic.  EOMI.  Sclerae clear.  NECK:  Supple without lymphadenopathy.  No JVD, no bruits.  No  lymphadenopathy.  CARDIOVASCULAR:  Pulses were 2+ and equal without bruits.  Heart rate  was regular rate and rhythm.  S1 and S2.  LUNGS:  Clear to auscultation without wheezes, rales, or rhonchi.  She  did have decreased lung sounds at the bases.  She had shortness of  breath with exam when sitting, and moving around.  SKIN:  Her skin was free of rashes and lesions.  GASTROINTESTINAL:  Abdomen soft and nontender.  Normal bowel sounds.  No  rebounding.  No pain on palpation, and no hepatosplenomegaly.  No CVA  tenderness.  EXTREMITIES:  No cyanosis, no rashes, no petechiae, 2+ edema.  Pedal in  her right greater than her left, up to her mid calf.  She had right knee  replacement scar on her right side.  No joint effusions, no  spinal  tenderness.  NEUROLOGIC:  Alert and oriented x3.  Cranial nerves 2-12 grossly intact,  and no asterixis.   LABORATORY DATA:  White count 7.2, hemoglobin 10.0, hematocrit 29.9,  platelets 177, sodium 136, potassium 4.1, chloride 106, CO2 20, BUN 47,  creatinine 2.27.  Glucose 190.  She has a baseline creatinine of 1.79 on  August 09, 2007.  Has been trending up since December 2008.  To look  back for 2 years, it has been fluctuating.  There is a uric acid of  14.4, hemoglobin A1c 6.3, calcium 8.7.   ASSESSMENT AND PLAN:  The patient is a 77 year old white female with CAD  and atrial fibrillation with chronic kidney disease, hypertension,  hyperlipidemia, and hypothyroidism.  Admitted for control of atrial  fibrillation and evaluation for coronary artery bypass graft with  increasing creatinine values.   1. Acute chronic renal failure with creatinine 20, upward with active      diuresis.  Creatinine has fluctuated over the past year and a half      from 1.3 to 2.3 mainly with episodes of congestive heart failure      exacerbation.  2. Underlying chronic kidney disease, stage 3 to 4.   DIFFERENTIAL DIAGNOSIS:  1. Renal artery stenosis.  2. Hypertension,.  3. Diabetes.  4. Congestive heart failure.  5. Cardiorenal syndrome.   RECOMMENDATIONS:  1. Check for UA, ASPEP, UPEP, 24 hour urine with immunofixation.  2. Renal ultrasound to rule out obstruction, number and size of      kidneys.  3. Hypertension, continue with medications, seems to be under control.  4. Anemia.  Chronic.  May need EPO.  Recommend iron, ferritin and TIBC      level in a.m.  5. Congestive heart failure.  Agree with Lasix to decrease to just      once daily instead of twice daily.  6. Diabetes.  Under control with medications.  Continue medications.  7. Atrial fibrillation.  Continue with medications.  8. Coronary artery disease, severe, 3-vessel, possible coronary artery      bypass graft.  Denies  chest pain on consult evaluation.     ______________________________  Jeoffrey Massed, PA, Student 2    ______________________________  Terrial Rhodes, M.D.    TS/MEDQ  D:  10/08/2007  T:  10/09/2007  Job:  [id number]

## 2010-12-13 NOTE — H&P (Signed)
[name], [name]              ACCOUNT NO.:  [id number]   MEDICAL RECORD NO.:  [id number]          PATIENT TYPE:  INP   LOCATION:  A220                          FACILITY:  [facility]   PHYSICIAN:  Dorris Singh, DO    DATE OF BIRTH:  [birth year]   DATE OF ADMISSION:  11/04/2007  DATE OF DISCHARGE:  LH                              HISTORY & PHYSICAL   Patient is a 77 year old female who presented to the emergency room from  [facility], the [facility], via EMS with the chief complaint of low  blood pressure and also she was reported to have a hemoglobin 7.9 today.  Her low blood pressure started yesterday.  Recently, the patient just  was sent to the [facility] after having a CABG on March 26, and she was  sent to the [facility] for rehabilitation. She has had several symptoms  that this is of lightheadedness, neck pain and weakness, but she denies  any chest pain at this point in time. After speaking with the ER doctor,  it was determined that the patient needed to be admitted to the service  of [facility].   PAST MEDICAL HISTORY:  1. Congestive heart failure.  2. Chronic obstructive pulmonary disease.  3. Hypertension.  4. Diabetes.  5. Anemia.  6. Atrial fibrillation.  7. Hyperlipidemia.  8. Hypothyroidism.  9. Obesity.  10.Pacemaker placement.  11.Pneumonia.  12.Renal insufficiency.  13.Transient ischemic attack.   SURGICAL HISTORY:  1. Appendectomy.  2. Back surgery.  3. Recent coronary artery bypass graft  in March 2009.  4. Knee replacement.  5. Pacemaker insertion.  6. Cardiac stent.   ALLERGIES:  She has no allergies to any drugs.   She lives with her husband has a special needs daughter who is in her  43s. She is a nonsmoker and nondrinker.   CURRENT MEDICATIONS:  1. Glipizide 10 mg once a day.  2. Furosemide 20 mg once a day.  3. Levothyroxine 75 mcg once a day.  4. Crestor 10 mg once a day.  5. Lopressor 25 mg twice a day.  6. Lexapro 10 mg once a  day.  7. Aspirin enteric-coated 325 once a day.  8. Prilosec 20 mg once a day.  9. Colace 100 mg once a day.  10.Norvasc 5 mg once a day.  11.Catapres 0.1 mg twice a day.  12.Coumadin 3 mg once a day.  13.Cozaar 50 mg once a day.  14.Ultram 50 mg q.4 h p.r.n.  15.NovoLog sliding scale.  16.Keflex 500 mg x1 week, second dose given today.   REVIEW OF SYSTEMS:  CONSTITUTIONAL:  Negative for fevers, chills or  sweats.  Positive for weakness.  EYES:  Negative for eye pain or visual  changes.  Ears, nose, mouth and throat are all within normal limits.  CARDIOVASCULAR:  Negative for chest pain or palpitation.  RESPIRATORY:  Negative for cough or dyspnea. GI: Negative for nausea, vomiting,  diarrhea.  GU: Negative for dysuria or hesitancy.  MUSCULOSKELETAL:  Positive for neck pain.  SKIN:  Negative for pruritus or rash.  NEURO:  Positive for  weakness and lightheadedness. HEMATOLOGIC:  Positive for  anemia.   PHYSICAL EXAM:  Her blood pressure is 98.6, pulse rate 59, respirations  18, temperature 97.8. Pulse ox is 95%.  GENERAL: The patient is a 77 year old female who is well-developed, well-  nourished in no acute distress.  Eyes:  Eyes are EOMI.  PERLA.  No  scleral icterus or conjunctival injection.  Ears: TMI is visualized  bilaterally.  Nose: Turbinates are moist.  Throat clear with dentures.  No erythema noted.  NECK:  Supple.  Full range of motion.  No lymphadenopathy.  CARDIOVASCULAR: Irregular rate and rhythm.  There is a murmur noted.  Respirations clear to auscultation bilaterally.  No rales, wheezes or  rhonchi.  ABDOMEN: Soft, nontender, nondistended.  No hepatosplenomegaly noted.  Bowel sounds in all four quadrants.  EXTREMITIES:  Full range of motion normal.  Normal in appearance. +2  pitting edema.  She does have an incision on the left leg as well as a  wrapped incision on the right leg.  NEURO:  Cranial nerves II-XII grossly intact.   EKG showed heart rate 102, normal  QRS, nonspecific changes, inverted T-  waves. She was typed and screened. Her chest x-ray showed cardiomegaly,  no active process.  Her white count of 9.2 and 7.9, hematocrit 23.6,  platelet count 251.  Her INR is 1.2.  Troponins are negative.  Her BMP:  Sodium was 132, potassium 4.1, chloride 101, carbon dioxide 23, glucose  157, BUN 63, creatinine 1.99.   ASSESSMENT/PLAN:  Will admit the patient to service of [facility].   DIAGNOSES:  She will be diagnosed with:  1. Hypotension.  2. Acute renal failure.  3. Anemia.  4. Atrial fibrillation.  5. Status post coronary artery bypass graft.   She will be admitted to telemetry with telemetry.  Will also see Dr.  Kristian Covey to consult and participate regarding acute renal failure. GI to  consult regarding anemia.  We will check her stools, do iron studies and  cross and type for 2 units of packed red bloods to transfuse.  Will hold  all NSAIDs and anticoagulants until that time. Also, we will get Dr.  Alanda Amass and his group of [facility] to see the patient while  she is here regarding her A fib and her recent status post coronary  artery bypass graft procedure in March 2009.  We will have O2 ready if  she needs it and will hydrate her cautiously. Will continue to monitor  the patient and make changes as necessary.      Dorris Singh, DO  Electronically Signed     CB/MEDQ  D:  11/04/2007  T:  11/04/2007  Job:  [id number]

## 2010-12-13 NOTE — Op Note (Signed)
[name], [name]              ACCOUNT NO.:  [id number]   MEDICAL RECORD NO.:  [id number]          PATIENT TYPE:  INP   LOCATION:  2312                         FACILITY:  [facility]   PHYSICIAN:  Salvatore Decent. Dorris Fetch, M.D.DATE OF BIRTH:  1933/01/28   DATE OF PROCEDURE:  10/17/2007  DATE OF DISCHARGE:                               OPERATIVE REPORT   PREOPERATIVE DIAGNOSIS:  Three vessel coronary disease with congestive  heart failure.   POSTOPERATIVE DIAGNOSIS:  Three vessel coronary disease with congestive  heart failure.   PROCEDURE:  Median sternotomy, extracorporeal circulation, coronary  bypass grafting x4 (left internal mammary artery to left anterior  descending, saphenous vein graft to first diagonal, saphenous vein graft  to obtuse marginal, saphenous vein graft to distal right coronary) vein  harvest both thighs, excision of left atrial appendage.   SURGEON:  Salvatore Decent. Dorris Fetch, M.D.   ASSISTANT:  Rowe Clack, P.A.-C.  Danielle Zimmer, P.A.-C.   ANESTHESIA:  General.   FINDINGS:  Good quality targets. Vein fair quality. Mammary good  quality. Sternal osteoporosis. Difficulty with body habitus and bleeding  within the tracts precluding ability to harvest vein endoscopically,  vein harvested in open fashion.   CLINICAL NOTE:  [name] is a 77 year old woman with a history of  coronary disease.  She had been catheterized in January and, at that  time, was advised to undergo coronary bypass grafting but refused. She  subsequently had seen Dr. Tressie Stalker in the office to discuss bypass  surgery, but wished to delay it once again due to family issues.  She  was admitted to the hospital recently with congestive heart failure. She  was in atrial fibrillation with a rapid ventricular response.  She was  treated medically initially with improvement of her heart failure and  control of her atrial fibrillation and improvement of her renal  insufficiency to her  baseline.  She was offered coronary artery bypass  grafting. She was not felt to be a candidate for a MAZE procedure given  her age, renal dysfunction and pulmonary issues. The patient understood  and accepted the risks of coronary artery bypass grafting.  She  understood this was a high risk procedure and agreed to proceed.   OPERATIVE NOTE:  [name] was brought to the preop holding area on  October 17, 2007.  There, the anesthesia service placed lines for  monitoring arterial as well as central venous and pulmonary arterial  pressure.  Intravenous antibiotics were administered.  She was taken to  the operating room, anesthetized and intubated. A Foley catheter was  placed.  The chest, abdomen, and legs were prepped and draped in the  usual sterile fashion.  Transesophageal echocardiography was performed  and showed left ventricular hypertrophy, mild hypokinesis, and no  significant valvular pathology.  An incision was made in the medial  aspect of the right leg at the level of the knee.  The greater saphenous  vein was identified there. An attempt was made to harvest the vein  endoscopically.   Simultaneously, a median sternotomy was performed. Of note, there was  sternal  osteoporosis.  The left internal mammary artery was harvested  using standard technique.  The mammary artery was good quality.  5000  units of heparin was administered during the vessel harvest. The vein  harvest was not successful from the right leg due to the patient's  having a large fatty leg as well as bleeding within the tract.  An  incision was made in the medial aspect of the left leg at the level of  the knee and, again, an attempt was made to harvest the vein  endoscopically.  The vein was cleared of surrounding tissue, however  again, the attempt was unsuccessful. The vein was harvested from the  left thigh after mobilizing and taking a few of the branches  endoscopically with a hybrid technique with  bridged small incisions.  The vein turned out to be better quality than expected, but was fair  quality due to thin wall.  An additional short segment of vein was  harvested from the lower right thigh. This was satisfactory quality.   The pericardium was opened.  The ascending aorta was inspected, there  was no atherosclerotic disease.  The aorta was cannulated via concentric  2-0 Ethibond pledgeted pursestring sutures after giving the remainder of  the full heparin dose and confirming adequate anticoagulation with ACT  measurement. A dual stage venous cannula was placed via a purse-string  suture in the right atrial appendage.  Cardiopulmonary bypass was  instituted and the patient was cooled to 32 degrees Celsius.  The  coronary arteries were inspected and anastomotic sites chosen.  The  conduits were inspected and cut to length. A foam pad was placed in the  pericardium to protect the left phrenic nerve.  A temperature probe was  placed in myocardial septum and a cardioplegic cannula was placed in the  ascending aorta.   The aorta was crossclamped.  The left ventricle was emptied via the  aortic root vent.  Cardiac arrest was achieved with a combination of  cold antegrade blood cardioplegia and topical iced saline.  1.5 liters  of cardioplegia was administered.  The myocardial septal temperature was  10 degrees Celsius.  The following distal anastomoses were performed.   First, a reversed saphenous vein graft was placed end-to-side to the  distal right coronary.  This was a 1.5 mm target.  The vein graft was  anastomosed end-to-side with a running 7-0 Prolene suture.  There was  good flow through the graft and good hemostasis.   Next, a reversed saphenous vein graft was placed to the second obtuse  marginal branch of the left circumflex.  This was a dominant lateral  branch.  It was a 1.5 mm good quality target.  The vein graft was  anastomosed end-to-side with a running 7- Prolene  suture. Again, at the  completion of the anastomosis, cardioplegia was infused, there was good  flow and good hemostasis.   Next, a reversed saphenous vein graft was placed end-to-side to the  first diagonal branch of the LAD.  This was a 1.5 mm good quality  target.  The vein graft was anastomosed end-to-side with a running 7-0  Prolene suture.  There was good flow and good hemostasis.   Next, the left internal mammary artery was brought through a window in  the pericardium.  The distal end was beveled and was anastomosed end-to-  side to the distal LAD.  The LAD was a 1.5 mm vessel.  It did have  posterior plaquing throughout its course,  but a 1.5 mm probe did pass to  the apex.  The mammary was anastomosed end-to-side with running 8-0  Prolene suture.  At the completion of the mammary to LAD anastomosis,  the bulldog clamp was briefly removed to inspect for hemostasis.  Immediate rapid septal rewarming was noted.  The bulldog clamp was  replaced.  The mammary pedicle was tacked to the epicardial surface of  the heart with 6-0 Prolene sutures.   Additional cardioplegia was administered.  The vein grafts were cut to  length and the cardioplegic cannulae was removed from the ascending  aorta and the proximal vein graft anastomoses were performed to 4.5 mm  punch aortotomies while under crossclamp. The proximal anastomoses were  performed with running 6-0 Prolene sutures. At the completion of the  final proximal anastomosis, the bulldog clamp was removed from the left  mammary artery.  Lidocaine was administered.  The aortic root was de-  aired and the aortic crossclamp was removed.  Total crossclamp time was  76 minutes.   While the patient was being rewarmed, all proximal and distal  anastomoses were inspected for hemostasis.  Epicardial pacing wires were  placed on the right ventricle and right atrium and DDD pacing was  initiated. The left atrial appendage was amputated with a  single firing  of the ATB-35 mm stapler.  There was good hemostasis at the staple line.  When the patient had rewarmed to a core temperature of 37 degrees  Celsius, he was weaned from cardiopulmonary bypass.  On the initial  attempt when the flow was reduced to half flow, there was air noted in  the vein grafts.  The patient was placed back on full bypass, the air  was aspirated from the vein grafts.  Additional air was noted in the  left ventricle by TEE. This was removed by placing a catheter into the  apex of the heart. The patient was then weaned from cardiopulmonary  bypass without difficulty. Total bypass time was 117 minutes.  The  initial cardiac index was greater than 2 liters per minute per meter  squared.  The patient remained hemodynamically stable throughout the  post bypass period.   A test dose of protamine was administered and was well tolerated. The  atrial and aortic cannulae were removed and the remainder of the  protamine was administered without incident.  The chest was irrigated  with 1 liter of warm normal saline containing 1 gram of vancomycin.  Hemostasis was achieved.  The pericardium was not reapproximated.  A  left pleural and two mediastinal chest tubes were placed through  separate subcostal incisions.  The sternum was closed with a combination  of single and double simple and figure-of-eight heavy gauge stainless  steel wires.  The pectoralis fascia, subcutaneous tissue and skin were  closed in standard fashion.  19-French Blake drains were left in the  saphenous vein tract on each side and the wounds were closed in standard  fashion.  All sponge, needle and instrument counts were correct at the  end of the procedure.  The patient was taken from the operating room to  the surgical intensive care unit in fair condition.      Salvatore Decent Dorris Fetch, M.D.  Electronically Signed     SCH/MEDQ  D:  10/17/2007  T:  10/17/2007  Job:  [id number]   cc:   Gerlene Burdock  A. Alanda Amass, M.D.  Madelin Rear. Sherwood Gambler, MD

## 2010-12-13 NOTE — Consult Note (Signed)
NAMEPRISCILA, BEAN              ACCOUNT NO.:  [id number]   MEDICAL RECORD NO.:  [id number]          PATIENT TYPE:  INP   LOCATION:  A202                          FACILITY:  [facility]   PHYSICIAN:  R. Roetta Sessions, M.D. DATE OF BIRTH:  Apr 11, 1933   DATE OF CONSULTATION:  DATE OF DISCHARGE:                                 CONSULTATION   REASON FOR CONSULTATION:  Acute on chronic microcytic anemia.   HISTORY OF PRESENT ILLNESS:  Ms. Fritze is a 77 year old female who was  admitted to [facility] with anemia found on preadmission  testing for a planned cataract surgery.  She was noted to have a  hemoglobin of 8.  She also described having symptoms of congestive heart  failure including weakness, fatigue, and shortness of breath.  She  denies any GI bleeding.  Denies any history of melena or hematochezia.  Denies any heartburn, indigestion, dysphagia, odynophagia, anorexia, or  early satiety.  Her weight has remained stable.  She does have a history  of chronic constipation.  She can go 5-6 days without a bowel movement.  She has not taken anything at home for this.  She denies any history of  diarrhea.  She was previously on Plavix back in October.  She tells me  this was discontinued by __________ .  She has never had a colonoscopy  or EGD.  She did have a right knee replacement in October and did  receive a 2 unit transfusion at that time per her report.   PAST MEDICAL AND SURGICAL HISTORY:  She has a history of chronic renal  insufficiency, GERD, pernicious anemia on B12.  She had an  echocardiograms in June 2008 which showed an ejection fraction of 50%.  She has CHF,  cataracts, and a right knee replacement in October.  She  received 2 unit transfusion at that time.  She has chronic constipation  and diabetes mellitus.  She is status post a cardiac stent.   CURRENT MEDICATIONS:  1. Darvocet p.r.n.  2. B12 injections monthly.  3. Lasix 40 mg daily.  4. Kay Ciel 20 mEq  daily.  5. Glipizide 10 mg b.i.d.  6. Levoxyl 75 mcg daily.  7. Hydralazine 50 mg t.i.d.  8. Lantus 30 units q.h.s.  9. Clonidine 0.1 mg b.i.d.  10.Nu-Iron 150 mg daily.   ALLERGIES:  No known drug allergies.   FAMILY HISTORY:  Mother deceased at age 75 secondary to Hodgkin's  lymphoma.  Father deceased in his 24s secondary to MI and CVA.  She has  4 healthy siblings.   SOCIAL HISTORY:  She has been married for 53 years.  She is a retired  __________ .  She has 4 healthy children.  She has a 30+ pack year  history of tobacco use.  Denies any alcohol or drug use.  She quit  smoking 7 years ago.   REVIEW OF SYSTEMS:  CARDIOPULMONARY:  She denies any chest pain or  palpitations.  PULMONARY:  She does have shortness of breath, chronic  cough, dyspnea on exertion.   PHYSICAL EXAMINATION:  VITAL SIGNS:  Temp 97.5, pulse 88, respirations  18, blood pressure 182/80, O2 sat 91% on room air.  GENERAL:  Ms. Lama is an elderly female who is alert, oriented,  pleasant, and cooperative in no acute distress.  HEENT:  Sclerae are clear, nonicteric.  Conjunctivae pink.  Oropharynx  pink and moist without any lesions.  NECK:  Supple without any mass or thyromegaly.  CHEST:  Heart regular rate and rhythm.  Normal S1, S2 without murmurs,  clicks, rubs, or gallops.  LUNGS:  Bilateral wheezes.  Respirations mildly labored.  ABDOMEN:  Positive bowel sounds x4.  No bruits auscultated.  Soft,  nontender, nondistended without palpable mass or hepatosplenomegaly.  No  rebound, tenderness, or guarding.  RECTAL:  No external lesions visualized.  Good sphincter tone.  No  masses palpated.  Small amount of light brown stool was obtained from  the vault which was Hemoccult negative.  EXTREMITIES:  2+ lower extremity pitting edema.  She does have clubbing.   LABORATORY STUDIES:  She had a white blood cell count of 8, hemoglobin  8, hematocrit 25.8, MCV 72, platelet count 344,000, INR 1.1, sodium 139,   potassium 4, chloride 109, CO2 21, glucose 113, BUN 16, creatinine 1.35,  calcium 8.7, total CK 37, CK-MB 1.6, and troponin 0.06.  BMP was 664.  B12 396, folate 8.2, ferritin 146.  Fecal occult blood was negative  through to the lab once.   IMPRESSION:  Ms. Ohagan is a 77 year old, Caucasian female who was  admitted with acute on chronic microcytic anemia, hemoglobin of 8 found  on preadmit testing for cataract surgery.  She was also admitted with  congestive heart failure.  She is found to have a normal ferritin.  No  evidence of GI bleeding.  She has a history of pernicious anemia.  B12  monthly.  Recent right knee replacement requiring 2 unit transfusion as  well.  She has never had a colonoscopy.  No GI complaints of chronic  constipation at this time.  Iron studies pending.   PLAN:  1. Followup iron studies.  2. Agree with proton pump inhibitor.  3. Outpatient colonoscopy once stable.  4. Transfuse to keep hemoglobin and hematocrit around 10 gm.  We would      like to thank the [facility], P Team for allowing [place] to participate      in the care of Ms. Crepeau.      Lorenza Burton, N.P.      Jonathon Bellows, M.D.  Electronically Signed   KJ/MEDQ  D:  07/15/2007  T:  07/16/2007  Job:  [id number]   cc:   ? Cindie Laroche, MD  [place], [place]

## 2010-12-13 NOTE — Discharge Summary (Signed)
NAMEONDRIA, OSWALD              ACCOUNT NO.:  [id number]   MEDICAL RECORD NO.:  [id number]          PATIENT TYPE:  INP   LOCATION:  2020                         FACILITY:  [facility]   PHYSICIAN:  Salvatore Decent. Dorris Fetch, M.D.DATE OF BIRTH:  02-Nov-1932   DATE OF ADMISSION:  10/07/2007  DATE OF DISCHARGE:                               DISCHARGE SUMMARY   FINAL DIAGNOSES:  Three-vessel coronary artery disease with congestive  heart failure.   HOSPITAL DIAGNOSES:  1. New onset atrial fibrillation.  2. Urinary tract infection.  3. Sick sinus syndrome.  4. Acute on chronic renal insufficiency.  5. Acute on chronic anemia.  6. Volume overload postoperatively.   SECONDARY DIAGNOSES:  1. Hypertension.  2. Adult-onset diabetes mellitus.  3. Chronic renal insufficiency.  4. Obesity.  5. Hyperlipidemia.  6. Remote transient ischemic attack, 6-7 years ago without recurrence.  7. Chronic obstructive pulmonary disease, quit smoking in 2001.  8. Mild left ventricular dysfunction in the past.  9. Hypothyroidism.   HOSPITAL OPERATION PROCEDURES:  1. Coronary artery bypass grafting x4 using left internal mammary      artery to left anterior descending, saphenous vein graft to first      diagonal, saphenous vein graft to obtuse marginal, saphenous vein      graft to distal right coronary artery.  Vein harvest from both      thighs.  Excision of the left atrial appendage.  2. Intraoperative transesophageal echocardiogram.  3. Placement of a dual chamber permanent pacemaker.   HISTORY AND PHYSICAL AND HOSPITAL COURSE:  Ms. Guerry is a 77 year old  female with a history of coronary artery disease.  She had been  catheterized in January and at that time, was advised to undergo  coronary artery bypass grafting but refused.  She subsequently had been  seen by Dr. Cornelius Moras in the office to discuss surgery, but wished to delay  once again due to the family issues.  She was admitted to the hospital  recently with congestive heart failure and atrial fibrillation and rapid  ventricular response.  The patient was treated medically initially with  improvement of her heart failure and control of her atrial fibrillation.  Also, noted to develop acute on chronic renal insufficiency, for which  she was seen by Dr. Arrie Aran.  This stabilized well preoperatively.  While in the hospital, the patient was seen and evaluated by Dr.  Dorris Fetch due to Dr. Cornelius Moras being in [facility].  Dr. Dorris Fetch  discussed with the patient undergoing coronary artery bypass grafting by  him versus waiting until Dr. Cornelius Moras returned.  He discussed the risks and  benefits with the patient.  The patient acknowledged  their  understanding and agreed to proceed with him.  As discussed with the  patient preoperatively that due to her family situation that she will  probably need skilled nursing facility postoperatively.  The patient  states she was in agreement with this.  The patient remained stable  preoperatively.  Surgery was scheduled for October 17, 2007.  For details  of the patient's past medical history and physical exam, please see  dictated  H&P and progress note.   The patient was taken to the operating room on October 17, 2007, where she  underwent coronary artery bypass grafting x4 using left internal mammary  artery to left anterior descending, saphenous vein graft to first  diagonal, saphenous vein graft to obtuse marginal, saphenous vein graft  to distal right coronary artery.  Excision of left atrial appendage was  done as well as vein harvest from bilateral thighs.  The patient  tolerated this procedure well, and transferred to the intensive care  unit in stable condition.  Postoperatively, the patient was noted to be  hemodynamically stable.  She remained intubated overnight.  By postop  day #1, the patient was waking up and able to be extubated without  difficulty.  Post extubation, she was noted to be  alert and oriented x4,  neuro intact.  The patient was placed on 2 L of nasal canula  postoperatively.  Her sats were greater than 98%.  Followup chest x-ray  done on postop day #1 was stable.  She had minimum drainage from chest  tube and chest tube discontinued in normal fashion.  Followup chest x-  ray remained stable with no pneumothorax.  The patient was encouraged to  use her incentive spirometer.  She was eventually able to be weaned off  oxygen when sats were greater than 90% on room air.  Postoperatively,  the patient was placed on IV amiodarone due to preoperative atrial  fibrillation.  Postoperatively, she was noted to be in normal sinus  rhythm.  She was started on Coumadin on postop day #1.  The patient  required pacing postoperatively due to bradycardia.  Underlying rhythm  was noted to be in the 50s, but sinus.  She was eventually able to be  switched off IV amiodarone to p.o. amiodarone.  Unfortunately, the  patient was unable to come off of the pacing due to low heart rate.  Cardiology evaluated the patient postoperatively and felt that she may  be a candidate for permanent pacemaker.  Coumadin was held.  On October 24, 2007, the patient underwent placement of a dual chamber pacemaker  without difficulty.  It was interrogated the following day.  There were  no acute issues.  The patient noted to be pacing normal sinus rhythm  prior to discharge to home.  Postoperatively, the patient did have mild  acute on chronic blood loss anemia.  Hematocrit dropped to 24% on postop  day #3 and received 1 unit packed red blood cells.  Hemoglobin and  hematocrit were continued to be followed.  She did remain stable and  improving prior to discharge.  Postoperatively, the patient's creatinine  was monitored.  She did have volume overload and encouraged starting on  diuretics.  The patient did have some acute on chronic renal  insufficiency preoperatively and her creatinine did elevate   postoperatively, went up to 2.2 postop day #4.  It was continued to be  monitored and was trending back down.  On postop day #7, it was 1.59.  Postoperatively, the patient was ambulating well in cardiac rehab.  She  was tolerating Diovan.  No nausea or vomiting noted.  Her incisions were  clean, dry and intact, and healing well.  She was restarted on Coumadin  following placement of pacemaker.  Daily PT/INRs were ordered.  Case  manager continued to work with patient postoperatively.  They currently  had a bed offer at [facility] currently awaiting the patient's  decision.  PLAN:  The patient to be transferred to [facility] on Monday, October 28, 2007, pending she remains stable.   FOLLOWUP APPOINTMENT:  Followup appointment has been arranged to see Dr.  Dorris Fetch on November 19, 2007, at 2.15 p.m.  The patient will need to  obtain PA and lateral chest x-ray 30 minutes prior to this appointment  at [facility].  The patient will need to follow up with  Dr. Alanda Amass in 2 weeks.  She needs to contact his office to make these  arrangements.   ACTIVITY:  The patient is to ambulate 3-4 times per day, progress as  tolerated and continued her breathing exercises.  She is to continue  using her breathing exercises.  No driving until she is released to do  so.  No heavy lifting over 10 pounds.   INCISIONAL CARE:  The patient was told to shower and washing her  incision using soap and water.  She is to contact the office if she  develops any drainage or openings from any of her incision sites.   DIET:  The patient is educated on diet to be low fat, low salt, and  diabetic diet.   DISCHARGE MEDICATIONS:  1. Lexapro 10 mg p.o. daily.  2. Levothyroxine 75 mcg p.o. daily.  3. Enteric-coated aspirin 325 mg p.o. daily.  4. Protonix 40 mg p.o. daily.  5. Colace 100 mg p.o. daily.  6. Norvasc 5 mg p.o. daily.  7. Glipizide 10 mg p.o. daily.  8. Catapres 0.1 mg p.o. b.i.d.  9.  Toprol-XL 25 mg p.o. daily.  10.Crestor 10 mg p.o. daily.  11.Protective barrier cream, apply to the affected area daily.  12.Ultram 50 mg 1-2 tablets q.4-6 h. p.r.n. pain.  13.The patient will need to be on a sliding scale insulin.      Theda Belfast, PA      Salvatore Decent. Dorris Fetch, M.D.  Electronically Signed    KMD/MEDQ  D:  10/25/2007  T:  10/26/2007  Job:  [id number]   cc:   Gerlene Burdock A. Alanda Amass, M.D.

## 2010-12-13 NOTE — Group Therapy Note (Signed)
NAMESTEPHENIA, [name]              ACCOUNT NO.:  [id number]   MEDICAL RECORD NO.:  [id number]          PATIENT TYPE:  INP   LOCATION:  A220                          FACILITY:  [facility]   PHYSICIAN:  Margaretmary Dys, M.D.DATE OF BIRTH:  08/15/1932   DATE OF PROCEDURE:  11/06/2007  DATE OF DISCHARGE:                                 PROGRESS NOTE   SUBJECTIVE:  Patient feels slightly better today.  She denies any chest  pain.   She had blood transfusion and has remained stable.  Hypertension has  also resolved.  The patient remains in atrial fibrillation.   She is sitting up in a chair and does feel well today.  The patient will  be ambulated tomorrow.   PHYSICAL EXAMINATION:  VITAL SIGNS:  Blood pressure is 135/80 with a  pulse ranging between 93-128, temperature 97.6, oxygen saturation 94% on  room air.  Blood sugar is ranging between 201-257.  HEENT:  Normocephalic and atraumatic.  Oral mucosa was moist.  No  exudates were noted.  NECK:  Supple.  No JVD or lymphadenopathy.  LUNGS:  Diffuse air entry bilaterally with occasional crackles at the  bases.  HEART:  S1 and S2.  Irregularly irregular.  No S3 or S4 gallops or rubs.  ABDOMEN:  Soft, nontender.  Bowel sounds positive.  EXTREMITIES:  Patient is status post an incision on the left leg from a  saphenous vein graft.   LABORATORY/DIAGNOSTIC DATA:  Her white blood cell count is 7.5,  hemoglobin 9.7, hematocrit 28, platelet count 271.  Sodium is 139,  potassium 4, chloride 107, CO2 22, glucose 177, BUN 52, creatinine 1.58.  Albumin was 2.6.  BNP was 586.  TSH was 6.219.   ASSESSMENT/PLAN:  1. Atrial fibrillation with rapid ventricular response:  This has      improved somewhat.  Currently, her heart rate is in the 80s, but      she is still in atrial fibrillation.  Patient is currently on      amiodarone p.o. and also on Diltiazem infusion.  Patient is also on      metoprolol.  I think with the combination of these three  medications, patient's heart rate should continue to improve, and      hopefully she might even go into sinus rhythm.  The patient is      currently on anticoagulation with heparin infusion.  Heparin levels      have been checked.  Patient has been closely followed by her      cardiologist at [facility], Dr. Tresa Endo.  2. Severe anemia:  Patient has received 2 units of packed red blood      cells.  Her H&H is stable at this time.  There is no evidence of      overt bleeding.  3. Hypertension:  This is resolved.  Likely this is secondary to      atrial fibrillation.  4. Acute renal insufficiency:  Will continue to follow.  Dr. Kristian Covey      has also seen her and will continue recommendations as per Dr.  Befekadu.  5. Status post coronary artery bypass graft:  Patient appears to be      stable.  I think we will need to do as much physical therapy as      much as we can here.   DISPOSITION:  Patient will remain under close monitoring until hopefully  she goes into sinus rhythm, her heart rhythm becomes improved, and we  have completely transitioned her to p.o. rate control medications.      Margaretmary Dys, M.D.  Electronically Signed     AM/MEDQ  D:  11/06/2007  T:  11/06/2007  Job:  [id number]

## 2010-12-13 NOTE — Assessment & Plan Note (Signed)
OFFICE VISIT   [name], [name]  DOB:  Nov 24, 1932                                        Dec 19, 2007  CHART #:  [id number]   REFERRING PHYSICIAN:  Richard A. Alanda Amass, M.D.   [name] is a 77 year old woman who had coronary bypass grafting on  October 17, 2007.  She was last seen in the office on November 19, 2007.  At  that time she was having a small amount of serous drainage from her leg  wound.  She states that she has continued to require packing for the leg  wound.  She is tolerating that well.  She said that she had two dizzy  episodes.  In the morning when she first gets up she is dizzy and  improves through the day.  Her swelling has gone down tremendously.   PHYSICAL EXAMINATION:  [name] is a well-appearing 75-year white  female in no acute distress.  Blood pressure is 112/70, pulse 60,  respirations 18, saturation 95% on room air.  Her sternal incision is  well-healed, her sternum is stable.  Her leg wound is open.  There is a  pocket approximately 3 cm deep.  This was probed with a Q-tip and some  necrotic fatty tissue was evacuated.  It was then packed with Iodoform  gauze.   IMPRESSION:  Slow healing of leg wound.  There is no infection.  The  cavity is fairly deep and this may need to be unroofed to get it to  heal, but we are going to try with continued packing changes now that we  were able to express some necrotic fatty tissue. In regards to her  dizziness, I advised her to stop the p.m. dose of Demadex, she has been  on 60 b.i.d.  She has no peripheral edema and she may be a little  dehydrated so she is going to just take that during the day but  discontinued the evening dose.  I will plan to see a back in about three  weeks to check on the status of her leg wound to see if it needs to be  opened further.   Salvatore Decent Dorris Fetch, M.D.  Electronically Signed   SCH/MEDQ  D:  12/19/2007  T:  12/19/2007  Job:  [id number]   cc:    Madelin Rear. Sherwood Gambler, MD  R. Roetta Sessions, M.D.

## 2010-12-13 NOTE — Group Therapy Note (Signed)
NAMECORDELIA, [name]              ACCOUNT NO.:  [id number]   MEDICAL RECORD NO.:  [id number]          PATIENT TYPE:  INP   LOCATION:  A220                          FACILITY:  [facility]   PHYSICIAN:  [name] Shipper, MD     DATE OF BIRTH:  [birth year]   DATE OF PROCEDURE:  11/10/2007  DATE OF DISCHARGE:                                 PROGRESS NOTE   SUBJECTIVE:  The patient is feeling much better.  She denies any  complaints at this time.  She says she is not short of breath.  She has  been ambulating.   OBJECTIVE:  Her vital signs are all stable.  Her telemetry shows heart  rate has been in the 80-100 range.  Her respiratory rate is 16, blood  pressure 145/84, saturation 96% on room air.  She mentioned that she is  not on any O2 at [facility].   PHYSICAL EXAMINATION:  HEENT:  There is no pallor, no icterus.  LUNGS:  Lungs reveal crackles at bilateral bases.  No wheezing is  appreciated.  ABDOMEN:  Is soft, nontender, nondistended.  CARDIOVASCULAR:  S1, S2 is irregular.  EXTREMITIES:  She is a recent status post CABG surgery.  This was done  end of March 2009.  She has, her right lower extremity shows this one  area which is having some yellowish drainage.  No induration is noted.  The rest of the areas look okay.  Peripheral pulses are poor but  palpable.   She has normal white count, hemoglobin is 10.1.  Her coags are normal at  this point except for the heparin levels.  She is on, her renal function  is stable, she has chronic renal insufficiency.  BNP is slightly  elevated.  She had cultures of the wound sent a few days ago which grew  moderate Serratia marcescens which was sensitive to Cipro and sensitive  to ceftriaxone.  Also sensitive to Cipro.   ASSESSMENT AND PLAN:  1. Atrial fibrillation with rapid ventricular response.  She has come      off of the Cardizem drip.  Her metoprolol dosage was increased      yesterday.  Her heart rate seems to be holding at this point.  She   continues to be on a heparin drip.  She is also on amiodarone.  Dr.      Domingo Sep from [facility] is following and managing      these issues.  She is also on Coumadin which I think the pharmacy      is dosing this as well.  So we await cardiology recommendations.  I      think since the patient is pretty stable we can achieve a      therapeutic INR as an outpatient but I would defer to cardiology on      this point who will be in to see the patient tomorrow.  2. Leg wound infection.  I will treat this with Cipro orally.  3. Renal insufficiency.  She has acute on chronic renal insufficiency      when she presented.  That seems to be stabilizing.  Dr. Kristian Covey      has been following her.  4. Coronary artery disease status post recent CABG (coronary artery      bypass graft).  Appears to be quite stable at this point.  We will      closely monitor her on telemetry.  5. Anemia.  Appears to be stable as well.  6. Hypertension, stable.  She has hypothyroidism as well, obesity,      pacemaker placement, history of TIAs (transient ischemic attacks),      history of CHF (congestive heart failure), diabetes, all of which      are quite stable at      this point.  7. So the plan on this is to await further recommendations and then      depending on that the patient will be able to go back with warfarin      to the nursing home in the next few days.      [name] Shipper, MD  Electronically Signed     GK/MEDQ  D:  11/10/2007  T:  11/10/2007  Job:  [id number]   cc:   [name] Rear. Sherwood Gambler, MD  Fax: [phone]

## 2010-12-13 NOTE — H&P (Signed)
[name], [name]              ACCOUNT NO.:  [id number]   MEDICAL RECORD NO.:  [id number]          PATIENT TYPE:  INP   LOCATION:  A202                          FACILITY:  [facility]   PHYSICIAN:  Dorris Singh, DO    DATE OF BIRTH:  [birth year]   DATE OF ADMISSION:  07/15/2007  DATE OF DISCHARGE:  LH                              HISTORY & PHYSICAL   PRIORITY ADMISSION HISTORY AND PHYSICAL   ADMITTING DIAGNOSES:  1. Congestive heart failure.  2. Hypertension.  3. Diabetes.  4. Anemia.  5. Pneumonia.  6. She has had a cardiac stent placed.  Her last echocardiogram was      done in 2007 with an ejection fraction of 35%.   The patient is a 77 year old female, who presented to the [facility]  Emergency Room with a chief complaint of anemia.  Apparently today, she  was scheduled to have cataract surgery done and they were doing her  testing and found that she was severely anemic.  At that point in time,  she was brought to the emergency room.  Also while she was in the  emergency room, she was found to have symptoms of CHF.   PAST MEDICAL HISTORY:  1. Congestive heart failure.  2. Hypertension.  3. Diabetes.  4. Anemia.  5. Pneumonia.  6. She has had a cardiac stent placed.  Her last echocardiogram was      done in 2007 with an ejection fraction of 35%.   PAST SURGICAL HISTORY:  1. Cardiac stent placement.  2. Right knee surgery back in October.   SOCIAL HISTORY:  She lives with her husband and her special needs  daughter, who is in her 24s.  She has been married for 53 years, is a  nondrinker, nonsmoker.   ALLERGIES:  No known drug allergies.   MEDICATIONS:  1. Clonidine 0.1 mg t.i.d.  2. Glipizide 10 mg once a day.  3. Lantus 30 units subcu at bedtime.  4. Metoprolol Tartrate 25 mg once a day.  5. Plavix 75 mg once a day.  6. Levoxyl 75 mcg daily.  7. Repleva-21 seven p.o. daily.   PRIMARY CARE PHYSICIAN:  Madelin Rear. Sherwood Gambler, MD.   REVIEW OF SYSTEMS:  GENERAL:   Positive for weakness and fatigue.  CARDIOVASCULAR:  Positive for dyspnea.  RESPIRATORY:  Positive for  dyspnea.  GASTROINTESTINAL:  Negative for nausea, vomiting, diarrhea.  GU:  Negative for dysuria or hesitancy.  SKIN:  Negative for pruritus,  rash, or abrasions.  NEURO:  Positive for some confusion.  HEMATOLOGIC:  Positive for anemia.   PHYSICAL EXAMINATION:  CURRENT VITAL SIGNS:  Her blood pressure is  163/58, pulse rate 97, respirations 24, and temperature 98.2.  Her pulse-  ox is 95% on room.  The patient did not take her medications today or  eat because she thought she was having cataract surgery.  GENERAL:  This is a well-developed, well-nourished, 77 year old,  Caucasian female, who answers questions appropriately.  SKIN:  Good turgor and good texture.  There is some pallor noted.  HEAD:  Normocephalic, atraumatic.  EYES:  PERRLA, EOMI.  NOSE:  Turbinates are moist and not inflamed.  TEETH:  There are not any.  THROAT:  There is no exudate or erythema noted.  NECK:  Supple, full range of motion, no tracheal deviation.  HEART:  Regular rate and rhythm, no rales or gallop or rub noted.  PULMONARY:  She has bilateral rales, no wheezing noted.  ABDOMEN:  Soft, nontender, and nondistended.  EXTREMITIES:  Positive bilateral +2 pitting edema.  NEUROLOGIC:  Cranial nerves II-XII are grossly intact, A+O x3, the  patient is appropriate.   LABORATORY DATA:  A CBC, which demonstrated a white count of 8.0,  hemoglobin 8.0, hematocrit 25.8, and a platelet count of 344.  Her INR  is 1.1.  Chemistry:  Sodium 139, potassium 4.0, chloride 109, CO2 21,  glucose 113, BUN 16, creatinine 1.35.  Her BNP was 664, and her urine  had small leukocytes and many epithelial cells.  She has been typed and  crossed.   ASSESSMENT AND PLAN:  1. Congestive heart failure.  2. Anemia.  3. Renal insufficiency.   Plan will be to admit patient to the service of [facility] inpatient.  She was transfused one  unit in the ED.  Will transfuse one more unit of  packed red blood cells here on the floor and check her H&H, and if she  is stable we will go ahead and hold a third unit and monitor to see if  it needs to be done.  Will follow her with serial H&H's.  Will also get  GI to consult and participate regarding anemia.  Will do hemoccult  stools and continue to monitor her.  For her CHF, she has not had an  echocardiogram done in the last year.  We will go ahead and repeat that.  The patient has seen Dr. Alanda Amass.  Will consult [facility]  [facility] to see her.  Will place her on Lasix 40 mg b.i.d.  Will also  do daily weights and strict I's&O's.  Will place the patient on two  liters nasal cannula.  Will also put her on breathing treatments.  Will  continue her on her home medications and place the patient on a sliding  scale.  For her  diabetes, we will place her on a sliding scale.  For hypertension, we  will place her on a home meds and continue to monitor her.  Will also  keep patient off of all anticoagulants and will place her on TED hose as  well.  We will get PT/OT to see her and will continue to monitor her  progress at this point in time.      Dorris Singh, DO  Electronically Signed     CB/MEDQ  D:  07/15/2007  T:  07/15/2007  Job:  [id number]   cc:   Madelin Rear. Sherwood Gambler, MD  Fax: [phone]

## 2010-12-13 NOTE — Group Therapy Note (Signed)
[name], [name]              ACCOUNT NO.:  [id number]   MEDICAL RECORD NO.:  [id number]          PATIENT TYPE:  INP   LOCATION:  A202                          FACILITY:  [facility]   PHYSICIAN:  Dorris Singh, DO    DATE OF BIRTH:  [birth year]   DATE OF PROCEDURE:  07/16/2007  DATE OF DISCHARGE:                                 PROGRESS NOTE   The patient was seen the day after ambulating. I reviewed her blood  work. We will go ahead and transfuse 1 more unit of blood to see if this  helps her improve. She was very short of breath after physical therapy.  Also, Dr. Domingo Sep saw her.  Agree with the current recommendation.   Her vitals are as follows:  Temperature 97, pulse 69, respirations 16,  blood pressure 154/72.  Generally, this is a 77 year old female who is well-developed, well-  nourished, in no acute distress.  Her heart is regular rate and rhythm. No rubs, gallops or murmurs noted.  PULMONARY:  She has bilateral rales. No wheezes noted.  ABDOMEN:  Soft, nontender, nondistended.  EXTREMITIES:  +2 bilateral pitting edema.  SKIN:  Still some pallor noted.  NEUROLOGICAL:  Cranial nerves II-XII grossly intact.   ASSESSMENT:  1. Congestive heart failure.  2. Microcytic anemia.  3. Renal insufficiency.   PLAN:  1. Currently will continue to follow patient and continue to diurese      her with pulmonary toilet. She is being followed by Dr. Domingo Sep      and will awake any further recommendations she may have.  2. Anemia. Will go ahead and transfuse patient 1 more unit of blood.      She has been seen by GI today who recommended her having an      outpatient colonoscopy. Will go ahead and proceed to set that up as      well.  3. Renal insufficiency. Currently, this is slightly improved for      today. Will continue to monitor her and change therapy as needed.      Also, her BNP for today was a little elevated as well, but we will      see how she progresses once we get one  more unit of blood in her      and see if that helps her overall. Anticipate discharge in 2-3      days.      Dorris Singh, DO  Electronically Signed     CB/MEDQ  D:  07/16/2007  T:  07/16/2007  Job:  [id number]

## 2010-12-13 NOTE — Group Therapy Note (Signed)
NAMEMARCUS, GROLL              ACCOUNT NO.:  [id number]   MEDICAL RECORD NO.:  [id number]          PATIENT TYPE:  INP   LOCATION:  A220                          FACILITY:  [facility]   PHYSICIAN:  Margaretmary Dys, M.D.DATE OF BIRTH:  [birth year]   DATE OF PROCEDURE:  11/07/2007  DATE OF DISCHARGE:                                 PROGRESS NOTE   SUBJECTIVE:  The patient just returned from an EGD which was really  unremarkable.  The patient is still a little drowsy.  She denies any  pain.  Her heart rate is now better controlled in the 83 range.   OBJECTIVE:  GENERAL:  Conscious, alert, comfortable, not in acute  distress although somewhat drowsy.  The patient is very easily  arousable.  VITAL SIGNS:  Blood pressure was 122/82, pulse was ranging between 91-  114, respiratory rate 24, temperature 97.6, oxygen saturation is 92% on  room air.  Blood sugars are ranging between 184-257.  HEENT:  Normocephalic, atraumatic.  Oral mucosa was moist.  No exudates.  NECK:  Supple.  No JVD or lymphadenopathy.  LUNGS:  Reduced air entry bilaterally but mostly clear.  HEART:  S1-S2, irregularly irregular.  No S3, S4, gallops or rubs.  ABDOMEN:  Soft, nontender.  Bowel sounds positive.  No masses palpable.  EXTREMITIES:  Trace pitting pedal edema.  No calf induration or  tenderness.  Dressing noted over the saphenous graft site.   LABORATORY DATA:  White blood cell count was 8.0, hemoglobin of 9.9,  hematocrit 28.9, platelet count was 273.  No left shift.  Sodium 138,  potassium 3.8, chloride 108, CO2 of 22, glucose 175, BUN of 44,  creatinine was 1.52.  Calcium was 8.7.  BNP was 470.   ASSESSMENT/PLAN:  1. Atrial fibrillation with rapid ventricular response.  This has      improved somewhat, although she remains on a Diltiazem infusion.      The patient is also on metoprolol and on amiodarone.  The patient      has spent most times with a controlled heart rate now.  Will also      continue on  heparin anticoagulation.  Will also initiate her      Coumadin at a dose of 3 mg once a day.  2. Severe anemia.  The patient has received 2 units of packed red      blood cells.  Hemoglobin is stable.  The patient had an EGD today      which was negative.  3. Hypotension.  This is likely due to a combination of severe anemia      with atrial fibrillation.  This has resolved.  4. Acute renal insufficiency.  The patient's BUN and creatinine has      improved.  Again, likely secondary to her hypertensive episode.  We      appreciate Dr. Susa Griffins input.  5. Status post coronary artery bypass grafting in March of 2009.  The      patient is doing well.  We will initiate physical therapy.      Continue on all previous medications.  DISPOSITION:  The patient will continue with heart rate control, which  appears to be the problem at this time.  I think once we are able to  achieve good heart rate control, the patient may be able to go back to  the nursing center.      Margaretmary Dys, M.D.  Electronically Signed     AM/MEDQ  D:  11/07/2007  T:  11/07/2007  Job:  [id number]

## 2010-12-13 NOTE — Consult Note (Signed)
NEW PATIENT CONSULTATION   Quillin, Shy S  DOB:  [birth year]                                        August 12, 2007  CHART #:  [id number]   PRIMARY CARE PHYSICIAN:  Dr. Elfredia Nevins.   REASON FOR CONSULTATION:  Severe 3 vessel coronary artery disease.   HISTORY OF PRESENT ILLNESS:  Ms. Sexson is a 77 year old obese, white  female [place], [place] with known history of coronary artery disease as well  as history of congestive heart failure, atrial fibrillation,  hypertension, type 2 diabetes mellitus, microcytic anemia, and chronic  renal insufficiency. The patient's cardiac history dates back to 2005 at  which time she apparently presented with an acute coronary syndrome in  the setting of new onset atrial fibrillation. She was treated with  percutaneous coronary intervention and stenting using the non-drug  eluting stent in the mid right coronary artery at that time. Over the  last few years she reportedly has had several episodes of shortness of  breath and lower extremity edema attributed to congestive heart failure,  for which she has been treated medically. In December 2008, she was  hospitalized at [facility] with exacerbation of class IV  congestive heart failure, as well as anemia. She responded to diuretic  therapy and optimization of her medications. Her stool remained heme  negative, and there was no sign of ongoing GI blood loss. She improved  with medical therapy, and was ultimately discharged. Since then she  apparently has been seen in followup by Dr. Sherwood Gambler, and her blood counts  have remained stable since transfusions she received while she was in  the hospital. She was seen in followup by Dr. Alanda Amass, and concerns  were raised regarding her symptoms of congestive heart failure. She  subsequently underwent elective cardiac catheterization on January 8th.  She was found to have severe 3 vessel coronary artery disease with  moderate  left ventricular dysfunction. She has now been referred for  possible surgical re-vascularization.   REVIEW OF SYSTEMS:  GENERAL: The patient reports that she has lost 20-25  pounds over the last 6 months to a year. She currently weighs  approximately 238 pounds, which is down from 275 pounds. She is 5 feet 4  inches tall. She is not sure why she has lost this weight, and she has  not been on any active dietary regimen.  CARDIAC: The patient denies any symptoms of chest pain, chest tightness,  chest pressure either with activity or at rest. She does have symptoms  of exertional shortness of breath. Her shortness of breath was much  worse at the time of her recent hospitalization at [facility] in  December, but she improved with medical therapy. She now gets short of  breath with mild to moderate ambulation or other physical activity. This  is by her report her primary limiting physical problem. At present she  denies resting shortness of breath, PND, or syncope. She has had  palpitations in the past and apparently had atrial fibrillation of  unknown duration or frequency. She has had problems with chronic lower  extremity edema which seems to be worse recently.  RESPIRATORY: Notable only for shortness of breath with exertion. The  patient denies productive cough, hemoptysis, or wheezing.  GASTROINTESTINAL: Negative. The patient has no difficulty swallowing.  She denies hematochezia,  hematemesis, melena.  PERIPHERAL VASCULAR: Notable for the absence of any pain suggestive of  claudication. The patient reportedly had some type of mini-stroke in the  distant past, but the details are totally unclear. She denies any recent  history of transient monocular blindness, transient visual disturbances  of other type, transient numbness or weakness involving either upper or  lower extremity.  HEENT: Notable for significantly decreased visual acuity out of both  eyes due to cataracts.   MUSCULOSKELETAL: Notable for no significant problems with arthritis or  arthralgias although the patient's physical mobility is somewhat limited  due to her obesity and shortness of breath.  PSYCHIATRIC: Negative.  ENDOCRINE: Notable for type 2 diabetes mellitus for which the patient  reports her blood sugars have been under good control. She is not aware  of her most recent hemoglobin A1c level.   PAST MEDICAL HISTORY:  1. Coronary artery disease status post percutaneous coronary artery      intervention and stenting of the right coronary artery in 2005.  2. Atrial fibrillation, details unclear.  3. Congestive heart failure.  4. Hypertension.  5. Type 2 diabetes mellitus.  6. Microcytic anemia.  7. Pernicious anemia.  8. Obesity.  9. Chronic renal insufficiency (baseline creatinine approximately      1.4).   PAST SURGICAL HISTORY:  1. Right total knee replacement, October 2008.  2. Lumbar laminectomy and diskectomy, 1989.  3. Appendectomy, 1956.   FAMILY HISTORY:  Noncontributory.   SOCIAL HISTORY:  The patient is married and lives with her husband and  70 year old daughter who is disabled. Her husband has not been well  recently, and in fact has been hospitalized over the last few days. 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 She lives a fairly sedentary  lifestyle. She has a previous history of tobacco use, although she quit  smoking 7 years ago. She denies alcohol consumption.   CURRENT MEDICATIONS:  1. Potassium chloride 20 mEq daily.  2. Hydralazine 50 mg 3 times daily.  3. Clonidine 0.1 mg twice daily.  4. Levothyroxine 50 mcg once daily.  5. Lasix 40 mg twice daily.  6. Isosorbide mononitrate 30 mg daily.  7. Glipizide 10 mg twice daily.  8. Diltiazem XR 120 mg daily.  9. Metoprolol 50 mg daily.  10.Protonix 40 mg daily.  11.Lantus insulin 40 units subcutaneous nightly at bedtime.    DRUG ALLERGIES:  None known.   PHYSICAL EXAMINATION:  The patient is an obese, white female who appears  her stated age or perhaps somewhat older, but she is in no acute  distress. Her blood pressure is 179/62, pulse 60 and regular. Oxygen  saturation 96% on room air. HEENT: Exam is grossly unremarkable. Neck is  supple. There is no cervical or supraclavicular lymphadenopathy. There  is no jugulovenous distention. No carotid bruits are noted. Auscultation  of the chest reveals diminished breath sounds at both lung basis with a  few inspiratory crackles. No wheezes or rhonchi are noted.  Cardiovascular exam is notable for regular rate and rhythm. Heart sounds  are distant. No murmurs are appreciated. The abdomen is obese but soft  and nontender. There are no obvious masses. Bowel sounds are present.  The extremities are warm and adequately perfused. There is severe  bilateral lower extremity edema, worse somewhat on the right than on the  left. Distal pulses are thready but palpable in the posterior tibial  position. There is no open skin lesion or ulceration. Rectal and GU  Exams: Both are deferred. Neurologic examination is grossly nonfocal and  symmetric throughout.   DIAGNOSTIC TESTS:  Cardiac catheterization performed by Dr. Alanda Amass on  August 08, 2007 at the [facility] is reviewed.  This demonstrates severe 3 vessel coronary artery disease with moderate  left ventricular dysfunction. Specifically, there is 80-85% stenosis of  the mid left anterior descending coronary artery just after takeoff of  the large first diagonal branch. There is 80% ostial stenosis of the  diagonal branch. There is 50 and 80% stenosis of the left mid circumflex  coronary artery before it gives rise to a large bifurcating second  circumflex marginal branch. There is right dominant coronary  circulation. There is 50% stenosis of the mid right coronary artery  followed by 80-85%  stenosis of mid right coronary artery at the acute  margin. There is moderate left ventricular dysfunction. There is  anterolateral hypokinesis with severe hypokinesis of the high inferior  wall and base. Ejection fraction is estimated 45%. There may be mild  mitral regurgitation, although this does not appear to be significant.   IMPRESSION:  Severe 3 vessel coronary artery disease with moderate left  ventricular dysfunction. Ms. Vangorden has had recent episode of  exacerbation of congestive heart failure in the setting of anemia. She  apparently has had problems with symptomatic congestive heart failure  off and on for some time now. She did not have any chest pain, but she  has progressive symptoms of exertional shortness of breath. Her physical  activity is quite limited. She also has some history of atrial  fibrillation, details of which are unclear. She has numerous other  medical problems as noted previously. I suspect that her coronary artery  disease would best be treated with surgical re-vascularization. The  risks of surgery will be somewhat elevated due to her numerous co-morbid  medical problems with particular note of her obesity, her diabetes, and  her somewhat limited physical activity at present.   PLAN:  I have detailed matters at length with Ms. Loomer here in the  office today. She understands all potential associated risks of surgery  including, but not limited to, risk of death, stroke, myocardial  infarction, congestive heart failure, respiratory failure, pneumonia,  bleeding requiring blood transfusions, arrhythmia, renal failure,  recurrent coronary artery disease, recurrent congestive heart failure,  late complications with difficulty with wound healing. At this point she  desires to hold off for at least a week or two before making definitive  plans for surgery, particularly due to her recent deterioration of her  husband's health. I have offered to schedule  surgery as soon as later  this week, but at this point she would desire to hold off. We will plan  to see her back for further follow up in 3 weeks. We will obtain results  of her most recent 12 lead electrocardiogram and 2D echocardiogram  performed at Dr. Kandis Cocking office. If she has not had an  echocardiogram since 2007, we will ask that one be repeated. All of her  questions have been addressed.   Salvatore Decent. Cornelius Moras, M.D.  Electronically Signed   CHO/MEDQ  D:  08/12/2007  T:  08/13/2007  Job:  [id number]   cc:   Gerlene Burdock A. Alanda Amass, M.D.  Madelin Rear. Sherwood Gambler, MD  Gaetana Michaelis, M.D.

## 2010-12-13 NOTE — Op Note (Signed)
NAMEJOVIE, SWANNER              ACCOUNT NO.:  [id number]   MEDICAL RECORD NO.:  [id number]          PATIENT TYPE:  INP   LOCATION:  A220                          FACILITY:  [facility]   PHYSICIAN:  Kassie Mends, M.D.      DATE OF BIRTH:  06-Oct-1932   DATE OF PROCEDURE:  11/07/2007  DATE OF DISCHARGE:                               OPERATIVE REPORT   REFERRING Dakari Cregger:  Dr. Domingo Sep.   PROCEDURE:  Esophagogastroduodenoscopy.   INDICATION FOR EXAMINATION:  Ms. Hasan is a 77 year old female who  presented with neck pain and low blood sugar.  She has a chronic  normocytic anemia over the last 2 months.  Her hemoglobin did drop to  7.7, and she required 2 units of packed red blood cells.  She has a  significant history of atrial fibrillation and had a pacemaker placed in  March 2009.  She was maintained on Coumadin as an outpatient but did not  ever appear to be therapeutic.  She had no evidence of active bleeding.  She has had appropriate transfusion response.  She has renal  insufficiency.  She has had three heme-negative stools.  The EGD is  being performed to evaluate for high-risk lesion in light of the fact  that she needs long-term anticoagulation.   FINDINGS:  1. Patent Schatzki's ring.  Otherwise normal esophagus without      evidence of Barrett's mass, erosion, or ulceration.  2. Streaky erythema in the antrum without erosion or ulceration.  No      old blood or fresh blood seen in the stomach.  3. Normal duodenal bulb and second portion of the duodenum.  No old      blood or fresh blood seen in the duodenum.   DIAGNOSES:  Mild gastritis.   RECOMMENDATIONS:  1. Check H pylori serology and treat if positive.  2. May restart heparin and proceed with Coumadin as an outpatient.  3. Daily PPI.  4. Advance diet.  5. Return patient visit with Dr. Cira Servant in 2 months. Will need an      outpatient colonoscopy. Will coordinate with input from Cardiology.   MEDICATIONS:  1. Demerol  25 mg IV.  2. Versed 1 mg IV.   PROCEDURE TECHNIQUE:  Physical exam was performed.  Informed consent was  obtained from the patient after explaining the benefits, risks, and  alternatives to the procedure.  The patient connected to monitor and  placed in left lateral position.  Continuous oxygen was provided by  nasal cannula and IV medicine administered through an indwelling  cannula.  After administration of sedation, the patient's esophagus was  intubated, and the scope was advanced under direct visualization to the  second portion of the duodenum.  The scope was removed slowly by  carefully examining the color, texture, anatomy, and integrity mucosa on  the way out.  The patient was recovered in endoscopy and discharged to  the floor in satisfactory condition.   ADDENDUM:  H. pylori serology negative      Kassie Mends, M.D.  Electronically Signed     SM/MEDQ  D:  11/07/2007  T:  11/07/2007  Job:  [id number]   cc:   Madelin Rear. Sherwood Gambler, MD  Fax: [phone]   Richard A. Alanda Amass, M.D.  Fax: [phone]

## 2010-12-13 NOTE — Discharge Summary (Signed)
[name], [name]              ACCOUNT NO.:  [id number]   MEDICAL RECORD NO.:  [id number]          PATIENT TYPE:  INP   LOCATION:  5004                         FACILITY:  [facility]   PHYSICIAN:  Mila Homer. Sherlean Foot, M.D. DATE OF BIRTH:  01-28-1933   DATE OF ADMISSION:  05/13/2007  DATE OF DISCHARGE:                               DISCHARGE SUMMARY   ADMISSION DIAGNOSIS:  Advanced degenerative joint disease of the right  knee.   DISCHARGE DIAGNOSES:  1. Advanced degenerative joint disease of the right knee.  2. History of hypertension.  3. History of diabetes mellitus, insulin-dependent.  4. Hypokalemia.  5. Acute blood loss anemia.   PROCEDURE:  Right total knee arthroplasty May 13, 2007.   HISTORY:  This is a 77 year old white female with longstanding right  knee pain.  It has worsened to where she is in constant severe pain,  mainly that of a throbbing-type pain.  It has worsened and she is now  having problems with difficulty with her activities of daily living.  She has failed conservative treatment.  She is now indicated for right  total knee arthroplasty.   HOSPITAL COURSE:  A 77 year old white female admitted May 13, 2007  and after appropriate laboratory studies were obtained as well as 2 g of  Ancef IV on-call the operating room, was taken to the operating room  where she underwent a right total knee arthroplasty by Dr. Georgena [name], assisted by [name] P.A.-C.  She tolerated the procedure  well.  She was continued postoperatively on Ancef 1 g IV q.6h times 3  doses.  Lovenox was used for antithrombotic prophylaxis.  A Foley was  placed intraoperatively.  She was placed on CPM postoperatively from 0  to 90 degrees for 6-8 hours per day.  PT, OT, and care management were  ordered.  She was allowed to be weightbearing as tolerated with a  walker.  She was allowed out of the bed to a chair the following day.  She was placed on a Dilaudid reduced dose PCA pump.   Diabetic sliding  scale was used in a moderate protocol.  She was allowed out of bed to  the chair the following day.  She was typed and crossed and transfused 2  units of packed cells on the May 14, 2007,  with 10 of IV Lasix  between units.  Urine was sent for repeat UA.  On the 15th, she was  having some difficulty with ambulation.  A knee immobilizer was ordered  as her knee continued to buckle up.  Iron sulfate 325 one p.o. t.i.d.  was ordered.  On the 16th, she still continued to have difficulty with  performing her physical therapy.  There was a question of whether SNF  was indicated.  However, on the 17th, she states that she was in an  improved state and that she had walked to the bathroom without  difficulty.  We are still waiting to see how well she does with her  physical therapy and if she does well, then she can be discharged on the  18th,  otherwise she will be discharged on the 17th to a skilled nursing  facility if a bed is available.   LABORATORY STUDIES:  Admitted with a hemoglobin of 11.4, hematocrit  34.5%, white count 10,200, platelets 342,000.  Discharge hemoglobin 9.3,  hematocrit 27.6%, white count 9,900, platelets 238,000.  Initial ProTime  12.9, INR 1.0 and PTT was 26.  Preoperative chemistries:  Sodium 140,  potassium 3.4, chloride 102, CO2 27, glucose 130, BUN 30, creatinine  1.46.  GFT was 35, total protein 6.5, albumin 3.4, AST 19, ALT 17, ALP  101 and total bilirubin 0.6.  Discharge sodium 140, potassium 4.5,  chloride 107, CO2 24, glucose 155, BUN 28, and creatinine was 1.54.  Urinalysis of May 07, 2007 revealed large leukocyte esterase, few  epithelials, white cells 11 to 20.  RBCs 0 to 2.  Bacteria rate.   Chest x-ray revealed no acute disease with interval resolution of  interstitial edema.  EKG showed normal sinus rhythm with ST and  T wave abnormalities are considered lateral ischemia.   DISCHARGE INSTRUCTIONS:  She is discharged on a  diabetic diet.  She is  to increase her activity slowly.  Use her walker, weightbearing as  tolerated on the right.  No driving or lifting for 6 weeks.  She will  follow blue total knee instruction sheet.   1. Prescription for Lovenox 40 mg to inject substantially at q.8 a.m.      daily; last dose May 27, 2007.  2. Percocet 5/325 one to two tablets every 4 hours as needed for pain.  3. Robaxin 500 mg 1 tablet every 6 hours as needed for spasm.  4. Potassium 20 mEq p.o. daily.  5. Glipizide 10 mg b.i.d.  6. Clonidine 0.1 mg b.i.d.  7. Hydralazine 50 mg t.i.d.  8. Lasix 40 mg b.i.d.  9. Polysaccharide 150 mg 4 p.m. daily.  10.Levothyroxine 50 mcg q.a.m.  11.Lantus Solo Star 30 units h.s. and a B12 injection monthly.   She will need to follow back up in our office on Tuesday, May 28, 2007 with Dr. Sherlean Foot.  Call any early if she has problems.  Discharged  improved condition.      [name], P.A.-C.    ______________________________  Mila Homer. Sherlean Foot, M.D.    BDP/MEDQ  D:  05/17/2007  T:  05/18/2007  Job:  [id number]

## 2010-12-13 NOTE — Op Note (Signed)
[name], [name]              ACCOUNT NO.:  [id number]   MEDICAL RECORD NO.:  [id number]          PATIENT TYPE:  INP   LOCATION:  2312                         FACILITY:  [facility]   PHYSICIAN:  Guadalupe Maple, M.D.  DATE OF BIRTH:  07-25-1933   DATE OF PROCEDURE:  10/17/2007  DATE OF DISCHARGE:                               OPERATIVE REPORT    INTRAOPERATIVE TRANSESOPHAGEAL ECHOCARDIOGRAPHY   [name] is a 77 year old white female with a history of  severe coronary artery disease who is scheduled to undergo coronary  artery bypass grafting by Dr. Dorris Fetch.  She is also noted to be in  atrial fibrillation.  Intraoperative transesophageal echocardiography  was requested to evaluate the left ventricular function and to serve as  a monitor for intraoperative volume status and to assess for any  valvular pathology.   The patient was brought to the operating room at [facility].  General anesthesia was induced without difficulty.  The trachea was  intubated without difficulty.  The transesophageal echocardiography  probe was inserted into the esophagus without difficulty.   IMPRESSION:  PRE-BYPASS FINDINGS:  1. Aortic valve.  The aortic valve was trileaflet.  It appeared to      open normally, and there was no aortic insufficiency.  2. Mitral valve.  There was mild to moderate mitral annular      calcification, especially in the area of the base of the posterior      leaflet.  The mitral leaflets opened normally and coapted well      without prolapse or fluttering.  There was trace to 1+ mitral      insufficiency in a central jet.  3. Left ventricle.  The left ventricular systolic function was      somewhat difficult to assess because the patient was in atrial      fibrillation, but there did appear to be that there was a moderate      left ventricular hypertrophy, which was concentric.  Left      ventricular wall thickness measured 1.35 cm in end diastole at  the      mid papillary level of the anterior wall and posterior wall.  There      appeared to be reasonably good contractility in all segments      interrogated.  The ejection fraction was estimated at 50%.  There      was no thrombus noted in the left ventricular apex.  4. Right ventricle.  Again, the right ventricular function was      somewhat difficult to assess because of the atrial fibrillation,      but there did appear to be good contractility of the right      ventricular free wall and normal right ventricular size.  5. Tricuspid valve.  There was trace to 1+ tricuspid insufficiency      noted, but the tricuspid valve appeared structurally intact.  6. Interatrial septum.  The interatrial septum was intact with no      evidence of patent foramen  ovaleevaluate for septal defect by  color Doppler and bubble study.  7. Left atrium.  There was mild spontaneous echo contrast or smoke      noted in the left atrial cavity but no thrombus noted in the left      atrium or left atrial appendage.  8. Ascending aorta.  There was mild atheromatous disease of the      ascending aorta, especially in the area of the right sinus of      Valsalva.  9. Descending aorta.  The descending aorta showed it was within normal      limits of size, and there was mild atheromatous disease.   POST BYPASS FINDINGS:  1. Left ventricle.  The left ventricular function was easier to assess      because the patient was undergoing dual chamber pacing.  The      ejection fraction was estimated at 50% with mildly decreased      contractility in all segments interrogated.  2. Mitral valve.  The mitral valve again showed trace to 1+ mitral      insufficiency.  3. Right ventricle.  There was good contractility of the right      ventricular free wall noted and normal right ventricular size.           ______________________________  Guadalupe Maple, M.D.     DCJ/MEDQ  D:  10/17/2007  T:  10/18/2007  Job:   [id number]   cc:   Patient's Chart  Guadalupe Maple, M.D.

## 2010-12-13 NOTE — Discharge Summary (Signed)
[name], [name]              ACCOUNT NO.:  [id number]   MEDICAL RECORD NO.:  [id number]          PATIENT TYPE:  INP   LOCATION:  A202                          FACILITY:  [facility]   PHYSICIAN:  Marcello Moores, MD   DATE OF BIRTH:  01-16-1933   DATE OF ADMISSION:  07/15/2007  DATE OF DISCHARGE:  12/18/2008LH                               DISCHARGE SUMMARY   PRIMARY CARE PHYSICIAN:  Madelin Rear. Sherwood Gambler, MD.  Cardiologist, Dani Gobble, MD.  Gastroenterologist, R. Roetta Sessions, M.D.   DISCHARGE DIAGNOSES:  1. Congestive heart failure, resolving.  2. Atrial fibrillation, controlled rate.  3. Diabetes mellitus, fairly controlled.  4. Hypertension, fairly controlled.  5. History of coronary artery disease, stable.  6. Anemia, stable.  7. Status post right knee surgery.  8. History of chronic obstructive pulmonary disease.  9. Chronic renal insufficiency.  10.Hypothyroidism.   DISCHARGE MEDICATIONS:  1. Potassium chloride 20 mEq p.o. daily.  2. Darvocet one tablet every 6 hours p.r.n.  3. Glipizide 10 mg p.o. twice a day.  4. Levothyroxine 50 mcg p.o. daily.  5. Hydralazine 50 mg p.o. three times a day.  6. Lasix 40 mg p.o. daily.  7. Vitamin B12 injection in her family physician in the office once a      month.  8. Clonidine 0.1 mg p.o. twice a day.  9. Lantus insulin 30 units subcu at bedtime.  10.Metoprolol 25 mg p.o. daily.  11.Plavix 75 mg p.o. daily.  12.Protonix 40 mg p.o. daily.  13.Cardizem 125 mg p.o. daily.   HOSPITAL COURSE:  [name] is 77 year old, female patient with the  above medical problems who presented to [facility] with  shortness of breath and chest x-ray showed pulmonary edema and BNP was  high.  She was admitted with congestive heart failure and she was put on  Lasix IV and she responded well.  Cardiology was also consulted and  consultation was done by Dr. Domingo Sep.  She optimized her medications.  She had anemia and was consulted  by GI, Dr. Jena Gauss, and colonoscopy is  planned as an outpatient.  Stool guaiac was negative three times and  there was not any frank bleeding.  The patient was on vitamin B12  injections before also for her anemia.  The patient later admitted that  she was forgetting to take her Lasix.  Currently, the patient responded  well.  No shortness of breath and her BNP is in the 300s.  Clinically,  she is well-improved.  Her diabetes as well as her hypertension is well-  controlled and her rate is fairly-controlled and the patient is ready to  go home.  She had an echocardiogram done in November 2007, which showed  overall left ventricular systolic function to be normal and mild to  moderate left atrial enlargement and mild mitral and tricuspid  regurgitation.  Because of that, echocardiogram which was planned to be  done with canceled by cardiologist.   DISCHARGE PHYSICAL EXAMINATION:  VITAL SIGNS:  The patient is stable  today with vital signs with temperature 98, pulse 68, respiratory rate  20 and blood pressure 160/76 and saturation is 98% on room air.  HEENT:  She has pink conjunctivae, nonicteric sclera.  CHEST:  She has good air entry bilaterally.  There are no rales or  cramps.  CARDIAC:  S1, S2 well-heard.  ABDOMEN:  Soft.  EXTREMITIES:  Slight pitting edema on the tibial area.  NEUROLOGIC:  She is alert and fairly oriented.   LABORATORY DATA AND X-RAY FINDINGS:  White blood cell is 7.9, hemoglobin  11, hematocrit 33, platelet count is 291.  Chemistry with sodium 140,  potassium 3.8, chloride 106, CO2 is 28, glucose 133, BUN 25, creatinine  is 1.4.  Calcium is 8.9 and BNP is 310.  Stool for hemoccult is negative  x3.   Chest x-ray showed pulmonary edema.   DISPOSITION:  She will be discharged today.   FOLLOW UP:  She will have followup with her PMD in 1 week.  Cardiologist  in 2 weeks.  Gastroenterology, Dr. Jena Gauss, for possible colonoscopy in 1  month for her anemia.  She got  transfused 2 units while she was in  hospital and her hemoglobin is stable now.      Marcello Moores, MD  Electronically Signed     MT/MEDQ  D:  07/18/2007  T:  07/19/2007  Job:  [id number]   cc:   Madelin Rear. Sherwood Gambler, MD  Fax: [phone]

## 2010-12-13 NOTE — Group Therapy Note (Signed)
[name], [name]              ACCOUNT NO.:  [id number]   MEDICAL RECORD NO.:  [id number]          PATIENT TYPE:  INP   LOCATION:  A220                          FACILITY:  [facility]   PHYSICIAN:  [name] Ferrara, MD         DATE OF BIRTH:  06/11/1933   DATE OF PROCEDURE:  DATE OF DISCHARGE:                                 PROGRESS NOTE   The patient denies any chest pain, shortness of breath.  No other  complaints.  She slept well.  She is beginning to ambulate.   OBJECTIVE:  VITAL SIGNS:  Stable.  Temperature 97.6, pulse 73,  respirations 20, blood pressure 133/79, pulse ox 94% on room air.  HEENT:  Normocephalic, atraumatic.  Sclerae anicteric.  Extraocular  muscles are intact.  NECK:  Supple.  No JVD, no carotid bruits.  LUNGS:  Bilateral crackles.  No wheezes, rhonchi, rales.  ABDOMEN:  Soft, nontender, nondistended.  Positive bowel sounds.  CARDIAC:  S1 and S2.  Irregular.  EXTREMITIES:  Status post CABG.  Right lower extremity shows an area  which is yellowish in drainage.  There is no cyanosis.  Peripheral  pulses are somewhat limited.   LABORATORY DATA:  INR 1.3.  White count 7.3, hemoglobin 10.2, hematocrit  29.8, platelets 252.  Basic metabolic panel within normal limits, except  for a mildly high BUN of 31, creatinine 1.61.   ASSESSMENT:  1. Atrial fibrillation with rapid ventricular response.  The patient      is currently off of Cardizem drip.  Metoprolol is currently at 37.5      mg every 8 hours.  She was also on amiodarone.  She is currently      subtherapeutic on her INR per pharmacy protocol.  2. Leg wound infection, currently treated with Cipro, stable.  3. Renal insufficiency that is acute-on-chronic.  Dr. Kristian [name] is      following along.  Her creatinine remains stable.  4. Coronary artery disease status post coronary artery bypass graft,      stable, with no active chest pain or shortness of breath.  5. Anemia.  This seems to be stable.  6. Hypertension,  stable.  7. Hypothyroidism, stable.  8. Obesity, stable.  9. Status post pacemaker, stable.  10.History of transient ischemic attacks, stable.   PLAN:  Discharge when she is clinically cleared by GI and cardiology.      [name] Ferrara, MD  Electronically Signed     RR/MEDQ  D:  11/11/2007  T:  11/11/2007  Job:  [id number]

## 2010-12-13 NOTE — Group Therapy Note (Signed)
[name], SON              ACCOUNT NO.:  [id number]   MEDICAL RECORD NO.:  [id number]          PATIENT TYPE:  INP   LOCATION:  A220                          FACILITY:  [facility]   PHYSICIAN:  Lucita Ferrara, MD         DATE OF BIRTH:  [birth year]   DATE OF PROCEDURE:  11/12/2007  DATE OF DISCHARGE:                                 PROGRESS NOTE   Subjectively speaking, patient feels okay.  She is much more awake and  comfortable. She denies any chest pain or shortness of breath.   VITAL SIGNS:  Temperature 97, pulse 124, respirations 18, blood pressure  145/91.  HEENT:  Normocephalic and atraumatic.  No JVD.  No carotid bruits.  LUNGS:  Clear to auscultation bilaterally.  CARDIOVASCULAR:  S1 and S2, regular rate and rhythm.  ABDOMEN:  Soft, nontender, nondistended, positive bowel sounds.  EXTREMITIES:  She has 2+ pitting edema.  Patient is status post CABG.  Right lower extremity erythema, no cyanosis, good pulses.   LABORATORY DATA:  Beta natriuretic peptide of 438.  Last Beta  natriuretic peptide was 300 and 470 on November 07, 2007.   Chest x-ray shows cardiomegaly with pulmonary vascular congestion status  post CABG.  When compared to November 04, 2007, there is some change.   ASSESSMENT/PLAN:  1. Atrial fibrillation with rapid ventricular response, currently in      sinus rhythm, currently off his Cardizem drip.  Patient is      currently on amiodarone for rhythm and rate control and also on      Lopressor stand at 75 mg b.i.d.  Per cardiology, continue incentive      spirometer, check Beta natriuretic peptide, recheck chest x-ray,      elevate legs, continue Coumadin per pharmacy protocol for goal INR,      replete potassium.  Shortness of breath most likely multifactorial      secondary to congestive heart failure and chronic obstructive      pulmonary disease with a component of obstructive sleep apnea.      Note, patient needs to be scheduled for outpatient sleep study.  Also, hydralazine will be increased to 25 mg p.o. t.i.d.  Lopressor      increased to 75 mg b.i.d.  Will change diltiazem to LA 100 mg p.o.      daily.  2. Lower extremity cellulitis currently on Cipro, day #3, shows      improvement.  3. Coronary artery disease status post coronary artery bypass      grafting.  4. Moderately high TSH at 6.219 on November 06, 2007.  I doubt that this      would contribute to her atrial fibrillation but I will go ahead and      get a free T3, free T4.  5. Elevated Beta natriuretic peptide with chest x-ray findings of      pulmonary edema.  She is clinically okay as far as her shortness of      breath goes.  Regardless, will go ahead and check another Beta      natriuretic  peptide in the morning.  Per cardiology, will increase      her hydralazine.  6. Obesity, stable.  7. Status post pacemaker, stable.  8. History of transient ischemic attack, stable.   PLAN:  Discharge patient when clinically stable in the next day or two.  She needs an outpatient polysomnogram scheduled.      Lucita Ferrara, MD  Electronically Signed     RR/MEDQ  D:  11/12/2007  T:  11/12/2007  Job:  [id number]

## 2010-12-13 NOTE — Assessment & Plan Note (Signed)
OFFICE VISIT   JOREEN, SWEARINGIN  DOB:  [birth year]                                        September 02, 2007  CHART #:  [id number]   HISTORY OF PRESENT ILLNESS:  The patient returns for further followup  related to 3-vessel coronary artery disease with tentative plans to  consider coronary artery bypass surgery.  She was originally seen in  consultation on August 12, 2007.  At that time she wanted to hold off  on plans for surgery because her husband had been hospitalized at [facility] with pneumonia and congestive heart failure.  Apparently, he  initially got some better and went home, but was again rehospitalized  with similar problems.  She has also been seen in followup by Dr.  Alanda Amass on January 16.  Dr. Alanda Amass has clarified some of her  previous history, in particular with notable history of a single episode  of atrial fibrillation in April 2005 associated with acute coronary  syndrome.  She converted to normal sinus rhythm during that  hospitalization, and has had no recurrence of atrial fibrillation since  then.  She had a recent echocardiogram performed July 26, 2007 at  the [facility].  This was notable for the  presence of normal left ventricular size and function with moderate,  concentric left ventricular hypertrophy.  There was mild-to-moderate  inferior wall hypokinesis.  There was mild thickening of the mitral  valve, and mild-to-moderate mitral annular calcification, but only mild  mitral regurgitation.  No other significant abnormalities were noted,  although there was mild aortic insufficiency as well.  A recent 12-lead  electrocardiogram was performed on August 06, 2007 and was notable for  the presence of normal sinus rhythm with occasionally premature  ventricular complexes, left ventricular hypertrophy.   The patient returns to the office today, and despite the fact that her  husband has been  rehospitalized, she desires to consider proceeding with  surgery in the reasonably near future.  Of note, she also developed a  productive cough, and she was treated with a Z-Pak for presumed upper  respiratory tract infection.  This has not helped, and her cough  persists.  She denies any fevers or chills.  She has not had any chest  pain.  She has not had any worsening of shortness of breath.  The  remainder of her review of systems is unchanged from previously.   PHYSICAL EXAMINATION:  Notable for a well-appearing obese female with  blood pressure 183/74, pulse 58, oxygen saturation is 92% on room air.  She is afebrile.  There is no palpable lymphadenopathy.  Auscultation of  the chest reveals coarse rhonchi in both lung fields.  No wheezes or  rales are noted.  Breath sounds are symmetrical.  Cardiovascular exam is  notable for regular rate and rhythm.  No murmurs, rubs, or gallops are  noted.  The abdomen is obese but soft, nontender.  There are no palpable  masses.  The extremities are warm and adequately perfused.   IMPRESSION:  I am concerned that the patient may have developed  bronchitis, if not pneumonia.  She has coarse rhonchi scattered  throughout both lung fields and a productive cough.  Overall, she looks  okay otherwise, and I suspect that we could consider proceeding with  surgery in  the near future if her upper respiratory tract infection  resolves.  She is hopeful to proceed with surgery sooner rather than  later.   PLAN:  We will tentatively plan to proceed with coronary artery bypass  grafting on Wednesday, February 11.  However, we will send the patient  for a chest x-ray today to make sure that she has not developed a  pulmonary infiltrate.  I have also given her a prescription for a 1-week  course of Avelox as well as Humibid in an effort to help her clear this  upper respiratory tract infection.  We will plan to see her back in the  office on Monday, February  9 to make sure that these symptoms have  resolved completely before proceeding with surgery.  All of her  questions have been addressed.   Salvatore Decent. Cornelius Moras, M.D.  Electronically Signed   CHO/MEDQ  D:  09/02/2007  T:  09/02/2007  Job:  [id number]   cc:   Gerlene Burdock A. Alanda Amass, M.D.  Madelin Rear. Sherwood Gambler, MD  R. Roetta Sessions, M.D.

## 2010-12-13 NOTE — Consult Note (Signed)
NAMELADEAN, [name]              ACCOUNT NO.:  [id number]   MEDICAL RECORD NO.:  [id number]          PATIENT TYPE:  INP   LOCATION:  A220                          FACILITY:  [facility]   PHYSICIAN:  Kassie Mends, M.D.      DATE OF BIRTH:  07-20-1933   DATE OF CONSULTATION:  11/04/2007  DATE OF DISCHARGE:                                 CONSULTATION   REFERRING PHYSICIAN:  Dorris Singh, DO   REASON FOR CONSULTATION:  Anemia.   HISTORY OF PRESENT ILLNESS:  [name] is a 77 year old female who has  a 2-3 month history of anemia.  Her hemoglobin has ranged from 7.9 to  10.8 in the last 2 months.  Her creatinine has been 2.26 and as low as  0.87 since March 2009.  She had a TIBC in March 2009 which was 260  (normal) , and a ferritin of 162.  She came to the hospital because of  neck pain and leg pain, and because her blood sugar was low.  She did  not complain of rectal bleeding, or black tarry stools. She has never  had a colonoscopy or EGD. She has not been on iron.  She has had no  weakness, abdominal pain, vomiting, heartburn, indigestion or problems  swallowing.   PAST MEDICAL HISTORY:  1. Diabetes for 10 years.  2. Cataracts bilaterally.  3. AFib requiring pacemaker in March of 2009.   PAST SURGICAL HISTORY:  1. Back surgery.  2. Appendectomy secondary to appendicitis.  3. Coronary artery bypass graft in March of 2009.   ALLERGIES:  NO KNOWN DRUG ALLERGIES.   MEDICATIONS AT HOME:  At home include:  1. Aspirin.  2. Coumadin.  3. Prilosec.   FAMILY HISTORY:  She denies any family history of colon cancer, colon  polyps, breast, uterine, or ovarian cancer.   SOCIAL HISTORY:  She  has been married for 53 years.  She has 1 child  who is mentally challenged and lives at home.  She is currently resides  in a nursing facility.   REVIEW OF SYSTEMS:  As per the HPI, otherwise all systems are negative.   PHYSICAL EXAMINATION:  Afebrile BP 100/70 HR 99.  GENERAL:  In general  she is no apparent distress, alert and oriented x4.  HEENT:  Atraumatic, normocephalic.  Pupils equal and reactive to light.  Mouth, no oral lesions.  Throat is without erythema or exudates.  NECK:  Full range of motion, no lymphadenopathy.LUNGS:  Clear to auscultation  bilaterally. CARDIOVASCULAR:  Irregular rhythm.  No murmur.  Left  ventricular heave.  ABDOMEN:  Bowel sounds are present, soft.  Nontender, nondistended.  No rebound or guarding.  EXTREMITIES:  Dry  dressing on the right lower extremity.  Her left lower leg incision is  well healed.  She has trace edema bilaterally.  NEUROLOGIC:  She has no  focal neurologic deficit.   LABORATORY DATA:  In December of 2008 she had a TIBC of 241 (low),  ferritin 146, B12 396, folate 8.2, reticulocyte index 0.81 c/w  inadequate bone marrow response to anemia.  Currently her  white count is  9.2.  Hemoglobin is 7.9.  Platelets 251, MCV 82, INR 1.2, BUN 53,  creatinine 1.5, reticulocyte index 0.51 to 0.79 c/w inadequate bone  marrow repsonse to her anemia.   ASSESSMENT:  [name] is a 77 year old female with chronic normocytic  anemia which is likely multifactorial.  It includes renal insufficiency,  recent surgery, and likely occult gastrointestinal blood loss which may  be secondary to gastritis, esophagitis, or a polyp. Thank you for  allowing me to see [name] in consultation.  My recommendation  follow.   RECOMMENDATIONS:  I discussed with [name] that we could address her  neck pain and her right lower extremity cellulitis for now. She could  have blood transfusion for now. I have no plans to perform endoscopy  unless she manifests evidence of active bleeding: hematemesis, melena,  or rectal bleeding or a transfusion dependent anemia. She voiced her  understanding. She may need oral iron and/or Epogen, but I will leave  this up to the primary team. Can restart her anticoagulation and would  closely monitir her  Hb/Hct.      Kassie Mends, M.D.  Electronically Signed     SM/MEDQ  D:  11/04/2007  T:  11/04/2007  Job:  [id number]   cc:   [name] Rear. Sherwood Gambler, MD  Fax: [phone]

## 2010-12-13 NOTE — Discharge Summary (Signed)
[name], [name]              ACCOUNT NO.:  [id number]   MEDICAL RECORD NO.:  [id number]          PATIENT TYPE:  INP   LOCATION:  A202                          FACILITY:  [facility]   PHYSICIAN:  Marcello Moores, MD   DATE OF BIRTH:  August 28, 1932   DATE OF ADMISSION:  07/15/2007  DATE OF DISCHARGE:  12/18/2008LH                               DISCHARGE SUMMARY   PMD:  Madelin Rear. Sherwood Gambler, MD   CARDIOLOGIST:  Dani Gobble, MD   GASTROENTEROLOGIST:  Jonathon Bellows, MD   DISCHARGING DIAGNOSES:  1. Congestive heart failure, compensated.  2. Diabetes mellitus, fairly controlled.  3. Atrial fibrillation, controlled.  4. Coronary artery disease, stable.  5. History of transient ischemic attack.  6. History of chronic obstructive pulmonary disease.  7. Obesity.   HOME MEDICATIONS:  1. Potassium chloride 20 mEq p.o. daily.  2. Darvocet one tablet q.6h. p.r.n.  3. Glipizide 10 mg p.o. twice a day.  4. Levothyroxine 50 mcg p.o. daily.  5. Hydralazine 50 mg p.o. t.i.d.  6. Lasix 40 mg p.o. b.i.d.  7. Vitamin B12 injection at her PMD office once a month.  8. Clonidine 0.1 mg twice a day.  9. Lantus insulin 30 units subcu at bedtime.  10.Metoprolol 25 mg p.o. daily.  11.Plavix 75 mg p.o. daily.  12.Cardizem 125 mg p.o. daily.  13.Protonix 40 mg p.o. daily.   Dictation ended at this point.      Marcello Moores, MD  Electronically Signed     MT/MEDQ  D:  07/18/2007  T:  07/19/2007  Job:  [id number]

## 2010-12-13 NOTE — Group Therapy Note (Signed)
NAMEFOYE, [name]              ACCOUNT NO.:  [id number]   MEDICAL RECORD NO.:  [id number]          PATIENT TYPE:  INP   LOCATION:  A220                          FACILITY:  [facility]   PHYSICIAN:  Skeet Latch, DO    DATE OF BIRTH:  Apr 28, 1933   DATE OF PROCEDURE:  DATE OF DISCHARGE:                                 PROGRESS NOTE   SUBJECTIVE:  The patient seems to be improving today.  The patient has  no obvious complaints.  The patient's shortness of breath is improved.  The patient has been ambulating down the hallway today.  The patient has  no chest pain or abdominal pain at this moment.   OBJECTIVE:  VITAL SIGNS:  Temperature is 97.2, pulse 120, respirations  22, blood pressure 140/76, O2 saturation 97% on room air.  LUNGS:  Clear to auscultation bilaterally.  No rales, rhonchi or  wheezing.  CARDIOVASCULAR:  S1-S2 irregular rhythm.  Her abdomen is obese, soft, nontender, nondistended.  Positive bowel  sounds.  EXTREMITIES:  She has 2+ edema.  No clubbing or cyanosis.   LABS:  Sodium 139, potassium 3.9, chloride 102, CO2 29, glucose 176, BUN  33, creatinine 1.2, BNP is 385, white count 6700, hemoglobin 10.0,  hematocrit 29.3, platelets 226,000.  PT is 17.2, INR is 1.3.  Chest x-  ray from yesterday showed cardiomegaly with pulmonary vascular  congestion, status post CABG and pacemaker, no definite pulmonary edema.   ASSESSMENT/PLAN:  1,  Atrial fibrillation with rapid ventricular  response, currently sinus rhythm.  The patient is currently on  amiodarone, Diltiazem, and Lopressor as well as Coumadin and heparin.  The patient is being followed by cardiology and no medications have been  changed at this point.  They agree with outpatient sleep study and feel  that she is stable from cardiac standpoint.  1. For shortness of breath.  Probably multifactorial with her CHF,      COPD and obstructive sleep apnea.  2. Hypertension.  The patient is on an hydralazine as well as  Lopressor at this time.  3. Lower extremity cellulitis.  She is currently on Cipro day #4.  4. History of CAD status post coronary artery bypass grafting.  5. History of BC.  6. Status post pacemaker.  7. History of transient ischemic attack.   The patient continues to improve, probable be discharged tomorrow.     Skeet Latch, DO  Electronically Signed    SM/MEDQ  D:  11/13/2007  T:  11/13/2007  Job:  [id number]

## 2010-12-13 NOTE — Group Therapy Note (Signed)
NAMEJODE, LIPPE              ACCOUNT NO.:  [id number]   MEDICAL RECORD NO.:  [id number]          PATIENT TYPE:  INP   LOCATION:  A220                          FACILITY:  [facility]   PHYSICIAN:  Dorris Singh, DO    DATE OF BIRTH:  06-Dec-1932   DATE OF PROCEDURE:  11/05/2007  DATE OF DISCHARGE:                                 PROGRESS NOTE   The patient seen today sitting up in bed.  She received 2 units of  packed red blood cells, and her H&H is actually improved.  She states  that she feels better.  PT has also seen her as well, and Dr. Tresa Endo has  seen her too.  It is discussed with him his plan for her which would be  adding some more medications to her regimen.  She has been seen by GI,  and she seems to be doing well today after receiving her blood.  Ms.  Hannen presented originally with the history of hypotension and was  found to be anemic in acute renal failure with anemia and atrial  fibrillation.  She is status post coronary artery bypass graft that was  done just recently at [facility], and she was at the [facility] for  skilled rehab.   PHYSICAL EXAMINATION:  VITAL SIGNS:  Today blood pressure is 130/100,  temperature 97.8, pulse 138, and respirations 20.  GENERAL:  The patient is a 77 year old woman who is well-nourished, well-  developed in no acute distress.  HEART:  Is tachy sinus.  LUNGS:  Clear to auscultation bilaterally.  ABDOMEN:  Soft, nontender, nondistended.  EXTREMITIES:  There is an incision on the left leg as well as a wrapped  wound on the right leg.   LABORATORY DATA:  Today her H&H has been remaining between 9.6 and 9.8  after receiving 2 units of blood.  Will continue with the serial  monitoring.  INR 1.2.  Sodium was 135, potassium 3.9, chloride 103, CO2  23, glucose 131, BUN 64, and creatinine 1.91.   ASSESSMENT/PLAN:  1. Atrial fibrillation.  This is being followed by Dr. Tresa Endo who has      added the patient to be started on amiodarone and  metoprolol to see      if that helps.  2. Anemia.  She has been corrected with a transfusion of 2 units.      Will continue to monitor her serial hemoglobins and hematocrits.  3. Hypotension.  This seems to be corrected as well.  Dr. Tresa Endo has      addressed that.  Now she is hypertensive diastolic wise.  4. Acute renal failure.  Currently we are cautiously hydrating her.      However, this has not changed.  Will go ahead and consult Dr.      Kristian Covey for any other recommendations that he may have regarding      her renal function.  Will      also take her off of any medications, but she is not on any type of      ACE inhibitor or ARB that could  be contributing to this at this      point in time.  We will continue to monitor her and anticipate      discharge in 1-2 days as long as she continues to improve without      incident.      Dorris Singh, DO  Electronically Signed     CB/MEDQ  D:  11/05/2007  T:  11/05/2007  Job:  [id number]

## 2010-12-13 NOTE — Discharge Summary (Signed)
[name], BRAZZLE              ACCOUNT NO.:  [id number]   MEDICAL RECORD NO.:  [id number]          PATIENT TYPE:  INP   LOCATION:  A220                          FACILITY:  [facility]   PHYSICIAN:  Skeet Latch, DO    DATE OF BIRTH:  05-24-1933   DATE OF ADMISSION:  11/04/2007  DATE OF DISCHARGE:  04/16/2009LH                               DISCHARGE SUMMARY   DISCHARGE DIAGNOSES:  1. Atrial fibrillation with rapid ventricular response, resolved.  2. Lower extremity cellulitis.  3. History of coronary artery disease.  4. History of congestive heart failure.  5. History of chronic obstructive pulmonary disease.  6. History of hypertension.  7. History of type 2 diabetes.  8. History of anemia.  9. History of hyperlipidemia.  10.History of renal insufficiency.   BRIEF HOSPITAL COURSE:  Please see the H&P done by Dr. Elige Radon on November 04, 2007.  This is a 77 year old Caucasian female who presented to the ER  from the [facility] with the chief complaint of low blood pressure and  hemoglobin of 7.9.  Apparently her hypotension started one day previous.  The patient was sent to the [facility] March 26 after having a CABG and  she was sent to the [facility] for rehabilitation.  The patient began  to complain of some lightheadedness, neck pain, weakness. No chest pain.  After speaking to the emergency room physician, it was decided that the  patient needed to be admitted for further evaluation.  EKG showed a  heart rate of 102, normal QRS, nonspecific changes, inverted T waves.  The patient was typed and screened and transfused.  She had a chest x-  ray that showed cardiomegaly.  No active processes.  Initial white count  was 9.2.  Her hematocrit was 23.6, platelet count 251,000.  INR was 1.2.  She had a negative cardiac enzymes.  Sodium 132, potassium 4.1, chloride  101, CO2 23, glucose 157, BUN 63, creatinine 1.99.  The patient was  admitted with hypotension, renal failure, anemia,  atrial fibrillation.  She was sent to the telemetry unit.  Nephrology was consulted regarding  her renal insufficiency.  GI was consulted regarding her anemia.  She  had stool studies as well as iron studies.  The patient was typed and  transfused packed red blood cells.  Her NSAIDs and anticoagulants were  held at this time and also cardiology was consulted regarding her atrial  fibrillation.  The patient was started on Epogen and iron therapy and  seems to be continuing to improve during her hospital stay.  Nephrology  continued to follow the patient and adjust her BUN and creatinine. The  patient did undergo a EGD on November 07, 2007 that showed 1) a patent  Schatzki's ring; otherwise normal esophagus without evidence of  Barrett's, mass, erosions, ulceration.  2)  Streaky erythema in the  antrum without erosion or ulceration.  No blood fresh level was seen.  3)  Normal duodenal bulb and second portion of the duodenum.  No old or  fresh blood seen.  4) Diagnosis of mild gastritis.  She  had H pylori  serology that was negative.  The patient continued to improve.  Her  medications were adjusted by cardiology. She was placed on amiodarone  and diltiazem.  Her Lopressor was adjusted.  The patient was placed on a  heparin drip and her Coumadin was adjusted and her heparin by pharmacy.  The patient's atrial fibrillation and rapid ventricular response  resolved.  The patient had a leg wound that was seen by wound care and  addressed.  It was being followed by wound care with the proper care as  well as the patient was placed on p.o. antibiotics.   RADIOLOGIC STUDIES:  The patient did have a chest x-ray upon admission  that showed cardiomegaly. No active disease.  Last x-ray done on November 12, 2007 showed cardiomegaly with pulmonary vascular congestion.  1)  Status post CABG and pacemaker.  2) No definite acute pulmonary edema.   At this time, I feel the patient is able to be discharged.    DISCHARGE MEDICATIONS:  1. Glipizide 10 mg once a day.  2. Levothyroxine 75 mcg daily.  3. Crestor 10 mg daily.  4. Lexapro 10 mg daily.  5. Aspirin enteric-coated 325 mg daily.  6. Prilosec 20 mg daily.  7. Colace 100 mg daily.  8. Coumadin 5 mg daily.  9. Cardizem 240 mg daily.  10.Amiodarone 400 mg daily.  11.Epogen 7000 units subcu weekly.  12.Hydralazine 25 mg three times a day.  13.Nu-Iron 150 mg daily.  14.Zaroxolyn 500 mg twice a day.  15.Lopressor 75 mg twice a day.  16.MiraLax 17 g p.o. daily.  17.K-Dur 30 mEq p.o. daily.  18.Demadex 60 mg p.o. daily.  19.Hydrocodone/acetaminophen 500/5 mg q.4-6h. as needed.  20.Cipro 500 mg twice a day for five days.  21.Ultram 50 mg as needed.  22.NovoLog sliding scale, previous dosing.   VITAL SIGNS ON DISCHARGE:  Temperature 97.7, pulse 63, respirations 16,  blood pressure 157/78, oxygen saturation 95% on room air.   LABORATORY DATA:  INR 1.5, PT 19.2.  Sodium 141, potassium 3.8, chloride  102, CO2 31, glucose 174, BUN 32, creatinine 1.94.  White count 6.9,  hemoglobin 2.1, hematocrit 29.4, platelet count 221,000.   CONDITION ON DISCHARGE:  Stable.   DISPOSITION:  The patient will be discharged to [facility].   DISCHARGE INSTRUCTIONS:  1. The patient is to maintain a low sodium, heart-healthy diet.  2. [facility] to dress her wound on her right leg.  3. The patient to increase her activity slowly.  4. The patient to follow up with Dr. Alanda Amass May 18, at 10:30 a.m.      in his office.  5. Office followup through her primary care physician in the next 7 to      10 days.  6. The patient is to return to the emergency room if any severe      symptoms or call her primary care physician.      Skeet Latch, DO  Electronically Signed     SM/MEDQ  D:  11/14/2007  T:  11/14/2007  Job:  [id number]

## 2010-12-16 NOTE — Discharge Summary (Signed)
NAMEJARIYAH, [name]              ACCOUNT NO.:  [id number]   MEDICAL RECORD NO.:  [id number]          PATIENT TYPE:  INP   LOCATION:  A207                          FACILITY:  [facility]   PHYSICIAN:  Madelin Rear. Sherwood Gambler, MD  DATE OF BIRTH:  24-Sep-1932   DATE OF ADMISSION:  10/11/2006  DATE OF DISCHARGE:  03/19/2008LH                               DISCHARGE SUMMARY   DISCHARGE DIAGNOSES:  1. Acute exacerbation of congestive heart failure.  2. Hypertension, poor control.  3. Noninsulin-dependent diabetes mellitus.  4. Chronic obstructive pulmonary disease.  5. Morbid obesity.  6. Chronic renal insufficiency.  7. Mild recurrent hypokalemia secondary to diuresis.  8. Incidental cystitis.  9. Hypothyroidism.  10.Pernicious anemia.  11.Iron deficiency anemia.   DISCHARGE MEDICATIONS:  1. Plavix 75 mg daily.  2. Lasix 40 mg p.o. b.i.d.  3. Glucotrol 20 mg daily.  4. Apresoline 20 mg p.o. t.i.d.  5. Synthroid 75 mcg p.o. daily.  6. Lopressor 25 mg p.o. daily.  7. Protonix 40 mg p.o. b.i.d.  8. KCl 20 mEq p.o. daily.   SUMMARY:  The patient was admitted with acute exacerbation of congestive  heart failure manifested by biventricular failure including pulmonary  edema and peripheral edema.  She responded well to parenteral diuresis  after a 3-day exacerbation of her failure and marked weight gain.  On  the day of discharge after aggressive diuresis and adjustment of her  blood pressure medicine, she had stable vital signs, mild hypertension,  and extremely mild hypokalemia at 3.4.  She will be seen in office 1  week post discharge and q.1 month for B12 injections.  She had a  notable iron deficiency and pernicious anemia combined.  It was felt  that part of her exacerbation of CHF was due to dietary indiscretion of  sodium intake as well as an anemia increasing myocardial workload.  On  discharge day, she was stable and asymptomatic.      Madelin Rear. Sherwood Gambler, MD  Electronically  Signed     LJF/MEDQ  D:  10/17/2006  T:  10/17/2006  Job:  [id number]

## 2010-12-16 NOTE — H&P (Signed)
NAMEKALANY, DIEKMANN              ACCOUNT NO.:  [id number]   MEDICAL RECORD NO.:  [id number]          PATIENT TYPE:  INP   LOCATION:  2102                         FACILITY:  [facility]   PHYSICIAN:  Coralyn Helling, MD        DATE OF BIRTH:  [birth year]   DATE OF ADMISSION:  09/17/2006  DATE OF DISCHARGE:                              HISTORY & PHYSICAL   ADMITTING DIAGNOSIS:  Pneumonia.   HISTORY OF PRESENT ILLNESS:  [name] is a 77 year old female who  presented to [facility] earlier in the day with one day of  nausea and vomiting.  She apparently had been having chills for the last  several days associated with a cough with sputum production, although  she is not sure what the color of the sputum was.  She has also been  having some sinus congestion.  She has been getting progressively more  short of breath and having leg swelling.  She said that she has had  several episodes of vomiting.  She does not have any problems as far as  swallowing.  She has also been having several episodes of diarrhea,  which she said was brown to black in color, but she is not having any  melena or hematochezia.  She currently denies any chest pains or  abdominal pain.  At [facility], she was noted to have an  elevation in her blood sugar as well as an infiltrate on her chest x-  ray.  She was initially given Levaquin and then Rocephin.  She was also  given nebulizer treatments with Xopenex and Atrovent as well as Reglan,  insulin and Lasix.  She was transferred to [facility] for  further treatment of her pneumonia and hyperglycemia.   PAST MEDICAL HISTORY:  Her past medical history is significant for:  1. Congestive heart failure with ejection fraction of 35%.  2. Diabetes.  3. Coronary artery disease.  4. Atrial fibrillation.  5. Transient ischemic attack.  6. Obesity.  7. Chronic obstructive pulmonary disease.   MEDICATIONS:  Her outpatient medications are:  1. Plavix  75 mg daily.  2. Aspirin 81 mg daily.  3. Toprol 25 mg daily.  4. Lotrel 10/40 one daily.  5. Pravachol 80 daily.  6. Clonidine 0.1 mg t.i.d.  7. Synthroid 75 mcg daily.  8. Glucotrol 10 mg daily.  9. Lasix 20 mg daily.  10.Potassium chloride 20 mEq daily.  11.Imdur 30 mg daily.  12.AcipHex 20 mg daily.  13.Ferrous sulfate and folic acid.   ALLERGIES:  SHE HAS NO KNOWN DRUG ALLERGIES.   FAMILY HISTORY:  Family history is significant for CVA, Hodgkin's  disease and seizure disorder.   SOCIAL HISTORY:  She quite smoking approximately 3-4 years ago.  Used to  smoke about a pack of cigarettes per day.   REVIEW OF SYSTEMS:  Essentially negative except for as stated above.   PHYSICAL EXAMINATION:  VITAL SIGNS:  On physical exam temperature is  100.7, blood pressure is 144/52, heart rate is 85, oxygen saturation is  85% on room air, increased to  96% on 3 L nasal cannula.  Respiratory  rate is 28.  HEENT:  Pupils reactive.  There is no sinus tenderness.  She has no oral  lesions.  No lymphadenopathy.  HEART:  S1 and S2, irregular.  CHEST:  She has diffuse bilateral expiratory wheezes with bibasilar  crackles.  ABDOMEN:  Obese, soft and nontender.  GENITOURINARY:  She had a Foley catheter in place with no obvious  lesions.  EXTREMITIES:  She has 2+ pitting edema.  NEUROLOGICAL EXAM:  She is alert and oriented, able to follow commands  appropriately and moves all four extremities.   STUDIES:  Chest x-ray shows an infiltrate in the left.  Arterial blood  gas showed a pH of 7.39, pCO2 of 29, pO2 of 62.  WBC is 14.4, hemoglobin  11.4, hematocrit 34.5, platelet count is 219.  Sodium is 124, potassium  is 4.2, chloride is 92, CO2 is 21, BUN is 22, creatinine was 1.97,  glucose was 68, anion gap was 13, AST is 15, ALT is 55, bilirubin 0.9  and albumin is 2.3, calcium was 8, BNP was 90/8.   IMPRESSION:  1. Community-acquired pneumonia.  I would admit her to the intensive      care  unit.  I will continue her on antibiotics with Rocephin and      Zithromax.  I will check blood cultures and urine cultures.  I will      also check an influenzae nasal swab, pneumococcal urinary antigen      and Legionella urinary antigen.  2. History of chronic obstructive pulmonary disease with bronchospasm.      I will start her on nebulizer treatments with EzPAP as well as      __________ .  3. Hypoxemia.  I will continue her on supplemental oxygen if she were      to deteriorate, but I would consider starting her on noninvasive      positive pressure ventilation.  4. History of diabetes with hypoglycemia.  I will start her on the      hypoglycemia protocol with IV insulin and then transition her once      her blood sugars are in the normal range.  5. Coronary artery disease, atrial fibrillation, congestive heart      failure and hypertension.  I will continue her on her home      medications.  6. Hypothyroidism.  I will continue her on Synthroid.  7. Diarrhea.  I will check her stools for occult blood.  8. I will start her on DVT and peptic ulcer disease prophylaxis.      Coralyn Helling, MD  Electronically Signed     VS/MEDQ  D:  09/17/2006  T:  09/18/2006  Job:  [id number]

## 2010-12-16 NOTE — H&P (Signed)
[name], [name]              ACCOUNT NO.:  [id number]   MEDICAL RECORD NO.:  [id number]          PATIENT TYPE:  INP   LOCATION:  A207                          FACILITY:  [facility]   PHYSICIAN:  Madelin Rear. Sherwood Gambler, MD  DATE OF BIRTH:  May 30, 1933   DATE OF ADMISSION:  06/26/2006  DATE OF DISCHARGE:  LH                              HISTORY & PHYSICAL   CHIEF COMPLAINT:  Shortness of breath.   HISTORY OF PRESENT ILLNESS:  The patient had progressively worsening  orthopnea as well as exertional dyspnea.  She denies any chest pain or  palpitations.  There has also been complaints of being difficult to  awaken in the morning with diaphoresis.  At no time has she been  checking her blood sugar.  She was given some recommendations about a  month or two ago about changing her thyroid hormone dose; however, she  states that she was unable to tolerate it and has been somewhat  noncompliant with my instructions.   PAST MEDICAL HISTORY:  Pertinent for diabetes mellitus type 2.  She is  not checking her sugars and appears to be very noncompliant with this  Other past medical history that is relevant is hypertension, known  coronary artery disease status post Cardiolite study in 2006 which  showed some possible ischemia.  She has a history of hypothyroidism.  She has been maintained on amiodarone as well chronically.   SOCIAL HISTORY:  No smoking, alcohol, or other drug use.   FAMILY HISTORY:  Deceased parents from stroke and Hodgkin's disease.  Paternal grandparents are deceased from unknown causes. She has three  brothers and one sister in good health, 4 children, one that has  seizures and mental retardation.   REVIEW OF SYSTEMS:  She denies any fever, rigors, chills, or vomiting,  or abdominal pain, specifically she denied any chest pain or pressure.  Other than the difficulty with her excessive sleepiness in the morning,  accompanied by some diaphoresis, she denied any headache or any  other  symptoms of hypoglycemia; although, again, she has not checked her blood  sugar.   PHYSICAL EXAMINATION:  VITAL SIGNS:  She is morbidly obese, awake,  alert.  HEAD AND NECK:  No JVD.  CHEST:  Bibasilar rales.  CARDIAC EXAM:  Regular rhythm, no gallop or rub appreciated.  ABDOMEN:  Protuberant.  Difficult to be sure, but no organomegaly was  appreciated.  EXTREMITIES:  4+ by pedal edema.  SKIN:  Positive for intertrigo.   Outpatient chest x-ray showed cardiomegaly with marked congestive heart  failure changes.  Other labs are pending at present. We did get an  electrocardiogram, in the office; it showed no ischemia changes, but old  anteroseptal wall/poor R wave progression with a sinus bradycardia.  Her  last echocardiogram in 2006 showed an ejection fraction of 75%.  She had  an 2002 saturation in the office of 97% on room air.   IMPRESSION:  1. New onset congestive heart failure.  Question contribution from      chronic amiodarone use.  Will repeat an echo.  She clearly needs to  be diuresed and this will be started in house.  Will also continue      her beta blocker on antiplatelet regimen with Plavix and aspirin.      Clearly we need to check her thyroid function and see if that is      contributing to anything as she has been noncompliant.  2. Hypertension. Continue her Lotrel for the benefit of ACE inhibitor      use and congestive failure.  3. Hypothyroidism.  Will check where she is at and get her properly      titrated to correct dose of Synthroid.  4. Diabetes mellitus type 2.  Will monitor her sugars.  Due to the      possibility of hypoglycemia in the morning, I am going to cut her      back a little bit on her outpatient regimen to see what her pattern      of blood sugars are.  Consultation with cardiology is also      anticipated especially regarding discontinuing the amiodarone.  For      now I am going to discontinue it in house to maximize  benefit.      Madelin Rear. Sherwood Gambler, MD  Electronically Signed     LJF/MEDQ  D:  06/26/2006  T:  06/26/2006  Job:  [id number]

## 2010-12-16 NOTE — Discharge Summary (Signed)
NAME:  BELVIE, IRIBE                        ACCOUNT NO.:  [id number]   MEDICAL RECORD NO.:  [id number]                   PATIENT TYPE:  INP   LOCATION:  2040                                 FACILITY:  [facility]   PHYSICIAN:  Richard A. Alanda Amass, M.D.          DATE OF BIRTH:  1933/05/11   DATE OF ADMISSION:  11/15/2003  DATE OF DISCHARGE:  12/02/2003                                 DISCHARGE SUMMARY   DISCHARGE DIAGNOSES:  1. Unstable angina treated with RCA, PCI November 16, 2003 with a cobalt Vision     stent.  Residual 50% circumflex stenosis.  2. LV dysfunction with an EF of 30%-35% at cath.  3. Atrial fibrillation, the patient was admitted in atrial fibrillation of     unclear duration, she is discharged in sinus rhythm on amiodarone.  4. Noninsulin-dependent diabetes.  5. Obesity.  6. Dyslipidemia.  7. Deconditioning.   HOSPITAL COURSE:  Ms. Ramberg is a 77 year old female with no prior cardiac  history who was transferred from [facility] November 15, 2003.  She  presented there with chest pain, orthopnea and shortness of breath.  She was  admitted to the CCU at [facility].  Her BNP was 195.  Troponin was 0.03.  She was  noted to be in atrial fibrillation.  She was put on IV heparin, nitrates,  and Cardizem for rate control.  It is unclear the duration of her atrial  fibrillation.  She was set up for diagnostic catheterization which was done  November 16, 2003 by Dr. Alanda Amass.  She had a 99% RCA stenosis that was  dilated and stented with a cobalt vision stent.  She also had a mid RCA  stenosis which was also intervened on.  She had a residual 40% circumflex.  Her EF was 30%-35%.  The plan was to leave her on aspirin and Plavix and  control her atrial fibrillation.  We were to start Coumadin in three to four  weeks after her intervention and then plan on cardioverting her.  She was  deconditioned.  Her atrial fibrillation was somewhat difficult to control.  She consistently ran a  high rate.  We gradually adjusted her rate control  medications adding Lanoxin and increasing her beta blocker as well as  Cardizem.  She then had episodes of sinus bradycardia and pauses.  Ultimately it was decided to start amiodarone and Coumadin.  She finally  converted to sinus rhythm prior to discharge.  At discharge her amiodarone  will be cut back to 400 mg a day.  By Dec 02, 2003 her INR was therapeutic  and we feel she can be discharged.  Plan is for Plavix and Coumadin for now.  She will followup with Dr. Alanda Amass in [place].  She will have a Protime  Monday after discharge.   DISCHARGE MEDICATIONS:  1. Coumadin 6 mg a day.  2. Amiodarone 400 mg a day.  3. Plavix 75 mg  a day.  4. Lopressor 25 mg b.i.d.  5. Protonix 40 mg a day.  6. Glyburide 5 mg twice a day.  7. Lotrel 520 once a day.  8. Niaspan 500 mg at h.s.  9. Crestor 20 mg a day.  10.      Nitroglycerin sublingual p.r.n.   LABORATORY DATA:  White count 7.7, hemoglobin 11.8, hematocrit 35, platelets  237, sodium 137, potassium 4.0, BUN 20, creatinine 1.1, urine culture was  negative.  Stool for blood was negative.  INR at discharge is 2.0.   DISPOSITION:  The patient is discharged in stable condition and will  followup with Dr. Alanda Amass in [place], Dec 21, 2003 at 12:45.  She will  have a Protime Monday.  Her amiodarone may need to be cut back to 200 mg a  day in a couple of weeks.      Abelino Derrick, P.A.                      Richard A. Alanda Amass, M.D.    Lenard Lance  D:  12/02/2003  T:  12/03/2003  Job:  [id number]   cc:   Gerlene Burdock A. Alanda Amass, M.D.  [phone] [address]  [place]  [place] [address]  Fax: [phone]   Madelin Rear. Sherwood Gambler, M.D.  [address]  [place]  [place] [address]  Fax: [phone]

## 2010-12-16 NOTE — Procedures (Signed)
NAME:  MYSTIQUE, BJELLAND                        ACCOUNT NO.:  [id number]   MEDICAL RECORD NO.:  [id number]                   PATIENT TYPE:  INP   LOCATION:  2930                                 FACILITY:  [facility]   PHYSICIAN:  Edward L. Juanetta Gosling, M.D.             DATE OF BIRTH:  1933/05/19   DATE OF PROCEDURE:  11/15/2003  DATE OF DISCHARGE:                                EKG INTERPRETATION   TIME:  November 15, 2003, at 1548.   The rhythm appears to be atrial fibrillation with ventricular response  around 150.  Q-waves are seen in V1, 2 and 3, suggestive of an anteroseptal  myocardial infarction.  QT interval was prolonged which may be due to drug  effect, primary myocardial disease or electrolyte imbalance.  There are T-  wave abnormalities which are nonspecific __________ ischemia and clinical  correlation is suggested.  Abnormal electrocardiogram.      ___________________________________________                                            Oneal Deputy. Juanetta Gosling, M.D.   ELH/MEDQ  D:  11/16/2003  T:  11/16/2003  Job:  [id number]

## 2010-12-16 NOTE — H&P (Signed)
NAMEOREAN, GIARRATANO              ACCOUNT NO.:  [id number]   MEDICAL RECORD NO.:  [id number]           PATIENT TYPE:  INP   LOCATION:  A207                          FACILITY:  [facility]   PHYSICIAN:  Madelin Rear. Sherwood Gambler, MD  DATE OF BIRTH:  1932/10/18   DATE OF ADMISSION:  10/11/2006  DATE OF DISCHARGE:  LH                              HISTORY & PHYSICAL   CHIEF COMPLAINT:  Shortness of breath.   HISTORY OF PRESENT ILLNESS:  The patient has 2-3 days of progressively  increasing shortness of breath.  The symptoms were exacerbated by lying  supine with very definitive orthopnea. She has also noted some  increasing pedal edema bilaterally.  She denied any chest pain,  palpitations, or syncope.  She denied hematemesis, hematochezia or  melena.  There has been no pleuritic chest pain, no cough or sputum.  No  hemoptysis.   PAST MEDICAL HISTORY:  Known cardiomyopathy with ejection fractions in  the 35% range last echocardiogram.  She has had previous congestive  heart failure and atrial fibrillation.  She also had a recent bout of  community-acquired pneumonitis with severe symptoms treated at [facility]. She has known COPD, morbid obesity, type 2 diabetes, and  chronic renal insufficiency of a mild degree.   SOCIAL HISTORY:  She does not smoke or drink.  She lives at home with  her husband.   FAMILY HISTORY:  Noncontributory.   REVIEW OF SYSTEMS:  CONSTITUTIONAL:  Positive weight gain, but no fever,  rigors, or chills.  HEAD AND NECK:  No headache, blurred vision, double  vision, or neurologic symptoms of note.  No syncope.  CARDIAC:  As under  HPI.  PULMONARY:  As under HPI.  GI:  As under HPI.  ALLERGIES/IMMUNOLOGIC:  No rashes, no swollen glands. PSYCHIATRIC:  No  mental status changes.  HEMATOLOGIC:  Positive previous anemia.   PHYSICAL EXAMINATION:  SKIN:  Pallor is noted, as well as conjunctival  pallor.  HEAD AND NECK:  No JVD.  CHEST:  Bibasilar rales.  CARDIAC EXAM:   Regular rhythm, no gallop or rub.  ABDOMEN:  No organomegaly or masses, no guarding or rebound tenderness.  EXTREMITIES:  Anasarca positive bilaterally with pitting edema.  NEUROLOGIC:  Nonfocal.   LABORATORIES:  Chest x-ray on my review, although not mentioned by the  radiologist, looks like congestive heart failure with a poor  inspiration.  Her blood work revealed a positive, severe elevation, of  her b-natriuretic peptide.  Initial arterial blood gas revealed pH  7.432, pCO2 of 34.6, pO2 of 69.2 on nasal cannula at 2 liters/28% FIO2.  CBC revealed a moderate anemia at 8.6 gm% with a normal white count,  normal platelet count and no left shift.  BMET revealed a mild elevation  of her creatinine 1.53, but otherwise was unrevealing.  The initial  cardiac markers were negative.  Anemia profile was obtained and revealed  a low B-12 level, normal folate level, and a low iron saturation at 15%.  Ferritin is pending.  Urinalysis positive for 11-20 white cells per high  power field.  IMPRESSION:  1. Acute exacerbation of congestive heart failure, admit for parental      diuresis, respiratory support, and supplemental oxygen.  2. Diabetes, sliding scale coverage.  3. Hypertension evident on admission.  This should remit with      diuresis, but intervene as needed.  Continue antihypertensives as      indicated.  4. Urinary tract infection, quinolone coverage pending cultures.  5. B12 deficiency.  We will start her on B12 injections and also      continue supplemental iron for a low iron saturation pending      ferritin studies.  Obtain serial Hemoccults and make sure that      there is no component of gastrointestinal bleeding.      Madelin Rear. Sherwood Gambler, MD  Electronically Signed     LJF/MEDQ  D:  10/12/2006  T:  10/12/2006  Job:  [id number]

## 2010-12-16 NOTE — Cardiovascular Report (Signed)
NAME:  [name], [name]                        ACCOUNT NO.:  [id number]   MEDICAL RECORD NO.:  [id number]                   PATIENT TYPE:  INP   LOCATION:  2930                                 FACILITY:  [facility]   PHYSICIAN:  Richard A. Alanda Amass, M.D.          DATE OF BIRTH:  08-22-1932   DATE OF PROCEDURE:  11/16/2003  DATE OF DISCHARGE:                              CARDIAC CATHETERIZATION   PROCEDURE:  Retrograde central aortic catheterization, selective coronary  angiography via Judkins technique, LV angiogram RAO, LAO projection,  subselective LIMA, RIMA,  abdominal aortic angiogram midstream PA  projection, weight-adjusted heparin, p.o. Plavix 600 mg plus continued  aspirin, Aggrastat double bolus plus infusion, percutaneous transluminal  coronary angioplasty and subsequent stenting high grade mid and proximal  subtotal RCA stenosis.   BRIEF HISTORY:  Ms. Tocci is a 77 year old married mother of four with  four grandchildren.  She takes care of one of her daughters, age 44, who has  mental retardation and epilepsy and continues to live with her.  She is  under a lot of physical and emotional stress at home.  She is a past smoker  quitting six months ago, has exogenous obesity, hyperlipidemia, hypertension  and AODM.  She suffered a possible transient ischemic attack four to five  years ago with no recurrence.  She has one week history of unstable angina  characterized by substernal chest pain at rest and with minimal exertion and  nocturnal.  There is no palpitations, but when she presented to [facility]  November 15, 2003 she was in atrial fibrillation with rapid VR at +-140 per  minute responding to IV Cardizem.  She was transferred over.  Treated  medically for probable acute coronary syndrome.  Initial enzymes were  negative for myocardial infarction including troponin, CPK and MBs.  Hypokalemia was treated with potassium supplement.   DESCRIPTION OF PROCEDURE:  She is brought  to the second floor CP lab in  postabsorptive state after 5 mg of Valium p.o. premedication on Cardizem  drip and nitroglycerin drip.  The right groin was prepped, draped in the  usual manner.  1% Xylocaine was used for local anesthesia.  The CRFA was  entered with single anterior puncture using modified Seldinger technique and  a 6 French short Daig side arm sheath was inserted without difficulty.  Diagnostic angiography was done with 6 French 4 cm taper preformed Cordis  coronary and pigtail catheters using Omnipaque dye throughout the procedure.  The patient was also hydrated preoperatively given Mucomyst because of her  history of  diabetes and baseline creatinine was 0.8, BUN 9.  LV angiogram  was done in the RAO and LAO projection at 25 mL, 14 mL per second; 20 mL, 12  mL per second respectively.  Pullback pressure of the CA was performed and  showed no gradient across the aortic valve.  Fluoroscopy showed mild mitral  annular calcification.  There was 2+ right  and 1+ left coronary  calcification.   The LIMA and RIMA were widely patent ungrafted.  The vertebrals were  antegrade bilaterally and there was no PCA or subclavian stenosis.   Abdominal angiogram demonstrated patent single renal arteries bilaterally  only mild infrarenal atherosclerotic disease with patent celiac, SMA and IMA  and patent iliacs and hypogastrics bilaterally.   The left coronary is normal.   The LAD proximal had an eccentric smooth 40% narrowing before the large  first diagonal and septal perforator branch.  The DX1 bifurcated and had  some minor irregularities in its proximal portion, but no significant  stenosis.  There was a small DX2 from the mid LAD that was normal and the  LAD coursed to the apex of the heart.  The mid and distal third had diffuse  smooth narrowing of about 40%, but good flow.   The circumflex was a moderately large vessel with 40% smooth narrowing of  the proximal third and 40 and  50% eccentric smooth narrowing in the mid  portion before the PABG branch and the distal bifurcating marginal.   The right coronary was a dominant vessel that was underfilled and there was  TIMI-2 flow to the distal vessel.  There was 95-99% disrupted segmental  stenosis in the proximal third and another 75% in the mid portion of the  vessel.  The patient was in atrial fibrillation with controlled ventricular  response.   LVEDP 24-28 mmHg.   LV and CA 110 systolic.   Atrial fibrillation controlled VR +-80-90 per minute on medical therapy.   Because of relative hypotension, the IC nitroglycerin was held.  Cardizem  was turned down.   The patient had shortness of breath and was given 40 mg of Lasix IV with a  Foley in place.   We prepared for PCI with 300 mg of Plavix x2 in the lab, 5000 units of  weight-adjusted heparin, Aggrastat bolus plus infusion with a double bolus  of 20 mcg/kg.  The RCA was intubated with a 6 [place] JR-4 side hole Scimed  guide.  The proximal and mid lesions were crossed with a soft tip Asahi  0.014 inch light guide wire.  It was somewhat difficult in crossing the  proximal lesion because of the disruptive nature, but this was accomplished  and the wire was free in the distal vessel.  The RCA proximal was dilated  with a 2.0/15 Scimed Maverick balloon at 6-23 and 6-36.  This improved flow  in the RCA considerably and it was underfilled prior to this and near TIMI-3  flow was restored.   The mid 75% lesion was then primarily stented with 2.75/15 chromium cobalt  stent that was deployed at 9-20 and post dilated at 9-23.   Using exchange technique, the proximal segmental lesion was then stented  with a 2.75/18 chromium cobalt vision Guidant stent deployed at 10-30, post  dilated at 12-40.  Balloon was pulled back.  IC nitroglycerin was administered 200 mcg.  Final injection showed stenosis reduction of the  proximal RCA reduced from 95-99 to 0% with  excellent wall apposition and no  dissection with good flow.   The mid RCA lesion was reduced from 75% to 0 with good flow.  There was  restoration of TIMI-3 flow in the RCA with plumping up of the distal  branches, particularly the PDA and PLA.   The patient had been given Fentanyl 50 mcg in divided doses during the  procedure for sedation.  At  the end of the procedure, she was awake and  oriented.  She was transferred to the holding area for postoperative care.  Final ACT was 245 seconds.   We plan to continue Aggrastat for 18 hours, aspirin and Plavix.  Chromium  cobalt alloy stents were used instead of drug-eluting stenting in this  patient because the necessity of putting her on Coumadin because of her  atrial fibrillation that historically is at least is a week old.  At  present, we will use aspirin and Plavix probably for approximately three  weeks and then add Coumadin and subsequently consider stopping either her  aspirin or Plavix at three to four weeks.  Will recommend rate control for  her atrial fibrillation now.   Her LV angiogram shows an EF of approximately 40-45% in the LAO projection  and 30-35 in the RAO projection.  It is probably falsely underestimated  because of the atrial fibrillation.  There was hypo-akinesis of the basilar  third of the inferior wall and hypokinesis of the mid-distal inferior wall  which hopefully will improve.  Also, the chances of conversion to sinus  rhythm may be much improved with treatment of her ischemic right coronary  disease.   Recommend  medical therapy of her noncritical left coronary disease and  ancillary medical problems.   CATHETERIZATION DIAGNOSES:  1. Acute coronary syndrome probably one week duration without myocardial     infarction on admission, but probable subendocardial myocardial     infarction by wall motion abnormalities.  2. Left ventricular dysfunction with atrial fibrillation probably     overestimated, EF  probably approximately 40%.  No mitral regurgitation.  3. Exogenous obesity.  4. Adult onset diabetes mellitus.  5. Chronic obstructive pulmonary disease, chronic cigarette use.  Quit six     months ago.  6. Hyperlipidemia.  7. Remote transient ischemic attack four years ago.  8. History of systemic hypertension.                                               Richard A. Alanda Amass, M.D.    RAW/MEDQ  D:  11/16/2003  T:  11/17/2003  Job:  [id number]   cc:   Madelin Rear. Sherwood Gambler, M.D.  [address]  [place]  [place] [address]  Fax: [phone]

## 2010-12-16 NOTE — Discharge Summary (Signed)
[name], [name]              ACCOUNT NO.:  [id number]   MEDICAL RECORD NO.:  [id number]          PATIENT TYPE:  INP   LOCATION:  A207                          FACILITY:  [facility]   PHYSICIAN:  Madelin Rear. Sherwood Gambler, MD  DATE OF BIRTH:  [birth year]   DATE OF ADMISSION:  06/26/2006  DATE OF DISCHARGE:  12/01/2007LH                               DISCHARGE SUMMARY   DISCHARGE MEDICATIONS:  1. Plavix 75 daily.  2. Aspirin daily.  3. Metoprolol XL 25 daily.  4. Lotrel 10/40 daily.  5. Pravachol 80 mg daily.  6. Clonidine 0.1 mg t.i.d.  7. Synthroid 75 mcg per day.  8. Glucotrol 10 daily.  9. Lasix 40 daily.  10.KCl 20 mEq daily.  11.Imdur 30 mg daily.  12.Iron.  13.Folic acid.  14.AcipHex 20 mg p.o. daily.   DISCHARGE DIAGNOSES:  1. Congestive heart failure.  2. Hypertension.  3. Hypothyroidism.  4. Type 2 diabetes mellitus.  5. Coronary artery disease.  6. Atrial fibrillation, chronic.  7. Transient ischemic attack/cerebrovascular disease.  8. Aortic valve sclerosis.  9. Morbid obesity.   SUMMARY:  The patient was admitted with a chief complaint of shortness  of breath with progressively worsening orthopnea and exertional dyspnea.  She was seen in the office and was quite tachypneic.  Chest x-ray and  exam confirmed marked congestive heart failure.  She was admitted for  parenteral diuresis and cardiovascular intervention  as needed.  She responded well interventions.  Her beta natriuretic  peptide did decrease.  She was seen in consultation by cardiology.  I  did advise that she add COQ-10 to her outpatient regimen in light of her  congestive heart failure as well as statin drug usage.  On discharge,  she was stable.  Follow up in office.      Madelin Rear. Sherwood Gambler, MD  Electronically Signed     LJF/MEDQ  D:  07/16/2006  T:  07/16/2006  Job:  [id number]

## 2010-12-16 NOTE — Discharge Summary (Signed)
[name], [name]              ACCOUNT NO.:  [id number]   MEDICAL RECORD NO.:  [id number]          PATIENT TYPE:  INP   LOCATION:  6731                         FACILITY:  [facility]   PHYSICIAN:  Charlcie Cradle. Delford Field, MD, FCCPDATE OF BIRTH:  March 29, 1933   DATE OF ADMISSION:  09/17/2006  DATE OF DISCHARGE:  09/25/2006                               DISCHARGE SUMMARY   DISCHARGE DIAGNOSES:  1. Community acquired pneumonia (no organism specified).  2. Chronic obstructive pulmonary disease.  3. Morbid obesity.  4. Diabetes type 2.  5. Chronic renal insufficiency.   LABORATORY DATA:  September 25, 2006 - white blood cell count 8.6,  hemoglobin 10.1, hematocrit 30.1, platelet count 363.  September 25, 2006  - sodium 138, potassium 3.9, chloride 106, CO2 24, glucose 69, BUN 76,  creatinine 3.27.  On September 24, 2006 - hemoglobin A1c 13.8.  On  September 24, 2006 - BNP 192.  On September 24, 2006 - TSH 1.146.  Blood  cultures times 2 obtained September 17, 2006, both negative.   RADIOLOGY:  On September 23, 2006 - demonstrating substantial improvement  in left lung pneumonia with residual infiltrate remaining in the mid  lung, however, overall improved aeration.   BRIEF HISTORY:  [name] is a 77 year old white female who presented  initially to [facility] with a one day history of nausea and  vomiting on September 01, 2006.  She apparently had been having chills for  the last several days associated with cough and sputum production,  although she was not sure what color her sputum was.  She had also  complained of sinus congestion.  She had become progressively more short  of breath with increased leg swelling.  She had had several episodes of  vomiting.  No difficulty swallowing.  Several episodes of diarrhea  noted.  Denied any chest pain or abdominal pain.  She was seen at [facility] with an elevated blood glucose and infiltrate in the left  lower lobe.  She was initially  given Levaquin and then Rocephin  intravenously.  As well as nebulized bronchodilators.  Then later  transferred to [facility].   PAST MEDICAL HISTORY:  Consists of congestive heart failure with a known  EF of 35%, diabetes type 2, coronary artery disease, atrial  fibrillation, transient ischemic attacks, morbid obesity and chronic  obstructive pulmonary disease.   HOSPITAL COURSE BY DISCHARGE DIAGNOSES:  1. Community acquired pneumonia (no organisms specified).  [name]      was admitted initially to the intensive care setting and treated in      the usual fashion with Rocephin and azithromycin to cover for      community acquired organisms.  She began to make marked improvement      over the course of her hospitalization.  She was later changed to      oral Avelox on September 21, 2006.  She will complete six more days      of antibiotic therapy upon time of discharge to complete a 10 day      course.  Upon  time of discharge her chest x-ray is markedly      improved and she exhibits no worsening in her pulmonary symptoms.  2. Chronic obstructive pulmonary disease.  This was managed primarily      with supplemental oxygen during the hospitalization, as well as      prednisone taper and inhaled bronchodilators.  Upon time of      discharge, [name] is close to baseline dyspnea status,      however, she continues to episodes of desaturation with ambulation.      Her oxygen saturation has dropped to 87% while walking in room air      without oxygen.  Therefore, she will be sent home on supplemental      oxygen at 2 liters nasal cannula, as well as inhaled      bronchodilators four times a day, as well as continuing four more      days of prednisone taper, at which time she will discontinue      prednisone all together and continue supplementation oxygen and      bronchodilators.  3. Morbid obesity.  Currently [name]'s weight is 113.6 kg.  She      was instructed to  follow a low carbohydrate, heart healthy diet,      with further followup per her primary care physician.  4. Diabetes type 2 with episodes of hyperglycemia.  [name] had a      significantly high hemoglobin A1c and several isolated episodes of      hyperglycemia during the hospitalization, requiring addition of      basal dose insulin.  Upon time of discharge she will be discharged      to home on Lantus 60 units subcutaneous injection nightly.  She has      been arranged to have a home blood glucose monitor and instructed      to check her blood glucose when she first awakes in the morning and      before bedtime and record this on a piece of paper and present this      to Dr. Sherwood Gambler at her next followup appointment.  She will also be      sent home as well on her Glucotrol.  5. Chronic renal insufficiency.  Upon time of discharge her creatinine      is ranging in the 3.2 range.  She will be discharged to home on a      strict blood pressure regimen consisting of her daily Toprol XL as      well as 3 times a day clonidine.  She had previously been on      Furosemide, but upon time of discharge we will hold off on that.      Will leave further management of her medical issues to her primary      care physician, which she has been instructed to see within the      next 7 days.   DISCHARGE INSTRUCTIONS:  Diet - she is to follow a low sodium heart  healthy diet with carbohydrate restrictions.  Activity - increase as tolerated, she is to use oxygen at 2 liters all  times.  Followup - Dr. Sherwood Gambler in one week.  She has been instructed to check her blood sugar first thing upon  awakening in the morning and then before bedtime and record this and  bring it to her followup doctor's appointment.   MEDICATIONS:  Prednisone 10 mg tablet 1 daily  x4 days then stop.  AcipHex 20 mg tablet 1 daily before breakfast.  Aspirin 81 mg tablet daily.  Plavix 75 mg tablet daily. Albuterol 2.5 mg and  Atrovent 0.5 mg inhaled nebulizer 4 times a day.  Synthroid 75 mcg cap daily.  Metoprolol XL 25 mg tablet daily.  Avelox 400 mg tablet daily times 6 days then discontinue.  Clonidine 0.1 mg tablet 3 times a day, instructed to take six to eight  hours apart.  Glucotrol 10 mg tablet daily.  Lantus insulin 60 units subcutaneously before bed.   SPECIAL INSTRUCTIONS:  [name] has had extensive home health care  arrangements, including R.N., PT, OT and social work.  She will have  R.N. instruction with insulin teaching prior to discharge, as well as  upon her first insulin dose tonight.   PHYSICAL EXAMINATION:  VITAL SIGNS:  At time of discharge, currently  temperature 97.8, heart rate 58, respirations 18-20, blood pressure  156/63, saturation 95% on 2 liters nasal cannula.  Blood glucose 172.  HEENT:  Neck is large, no clear JVD.  CARDIAC:  Regular rate and rhythm.  PULMONARY:  Crackles left lower lobe.  EXTREMITIES:  There is 3+ lower extremity edema, 2+ pulses, warm to  palpation.  ABDOMEN:  Soft, nontender.  GU:  Clear yellow.  NEUROLOGICAL:  Grossly intact.   DISPOSITION:  [name] has currently met maximum hospital benefit  from in-patient stay.  She will be discharged to home, instructions were  reviewed with both [name] and her son.  I have also talked to Ms.  [name]'s pharmacist at [facility] to verify her medication regimen.      Zenia Resides, NP      Charlcie Cradle. Delford Field, MD, [facility]  Electronically Signed    PB/MEDQ  D:  09/25/2006  T:  09/25/2006  Job:  [id number]   cc:   Madelin Rear. Sherwood Gambler, MD  Charlcie Cradle Delford Field, MD, FCCP

## 2010-12-16 NOTE — Procedures (Signed)
NAMECARMINE, [name]              ACCOUNT NO.:  [id number]   MEDICAL RECORD NO.:  [id number]          PATIENT TYPE:  INP   LOCATION:  A207                          FACILITY:  [facility]   PHYSICIAN:  Dani Gobble, MD       DATE OF BIRTH:  09/23/1932   DATE OF PROCEDURE:  06/27/2006  DATE OF DISCHARGE:                                ECHOCARDIOGRAM   REFERRING:  Dr. Sherwood Gambler.   INDICATIONS:  A 77 year old female with a past medical history of  hypertension, diabetes, PAF and known CAD is referred for evaluation of  congestive heart failure.   The technical quality of this study is reasonable.   The aorta measures normally at 3.6 cm.   Left atrium is mild to moderately dilated measured at 4.4 cm.   The interventricular septum is thickened, measured at 2 cm while  posterior wall is also thickened measured at 1.9 cm.   The aortic valve is probably trileaflet, although the left coronary cusp  was not well visualized.  Leaflet opening was reasonable.  There was  mild thickening without limitation to leaflet opening.  Mild aortic  insufficiency was noted.  Doppler interrogation of the aortic valve  suggests mild aortic sclerosis without definitive hemodynamic aortic  stenosis.   The mitral valve is mildly thickened without limitation to leaflet  excursion.  No mitral valve prolapse is noted.  Mild mitral annular  calcification is noted.  Mild mitral regurgitation is noted.  Doppler  interrogation of mitral valve is within normal limits.   The pulmonic valve is not well visualized.  There is mild pulmonic  insufficiency.   Tricuspid valve appears grossly structurally normal with mild tricuspid  regurgitation noted.   The left ventricle is normal in size with LV IDD measured at 5.0 cm and  LV IC measured 3.9 cm.  Overall left ventricular systolic function is  normal and no regional wall motion abnormalities were appreciated.   The right atrium is not well visualized and the right  ventricle appears  mild to moderately dilated with preserved right ventricular systolic  function.  There does appear to be mild to moderate right ventricular  hypertrophy.   IMPRESSION:  1. Mild to moderate left atrial enlargement.  2. At least moderate concentric left ventricular hypertrophy.  3. Mild aortic sclerosis without obvious stenosis and mild aortic      insufficiency.  4. Mild mitral and tricuspid regurgitation.  5. Mild pulmonic insufficiency.  6. Normal left ventricular size and systolic function without regional      wall motion abnormality noted.  7. The presence of diastolic dysfunction is inferred from pulse wave      Doppler across the mitral valve.  8. Mild to moderate right ventricular enlargement with preserved right      ventricular systolic function and mild to moderate right      ventricular hypertrophy.  9. Consider repeat echo in 3-6 months to reassess with a Doppler study      the aortic valve.           ______________________________  Dani Gobble, MD  AB/MEDQ  D:  06/27/2006  T:  06/28/2006  Job:  [id number]   cc:   Madelin Rear. Sherwood Gambler, MD  Fax: [phone]

## 2011-04-20 LAB — CBC
MCV: 74.1 — ABNORMAL LOW
Platelets: 183
RBC: 4.6
WBC: 8.2

## 2011-04-20 LAB — URINE MICROSCOPIC-ADD ON

## 2011-04-20 LAB — URINALYSIS, ROUTINE W REFLEX MICROSCOPIC
Glucose, UA: NEGATIVE
Hgb urine dipstick: NEGATIVE
Protein, ur: NEGATIVE
Specific Gravity, Urine: 1.015
Urobilinogen, UA: 0.2

## 2011-04-20 LAB — DIFFERENTIAL
Eosinophils Absolute: 0.2
Lymphs Abs: 0.9
Monocytes Relative: 11
Neutro Abs: 6.2
Neutrophils Relative %: 76

## 2011-04-20 LAB — BASIC METABOLIC PANEL
BUN: 37 — ABNORMAL HIGH
Calcium: 8.6
Creatinine, Ser: 1.79 — ABNORMAL HIGH
GFR calc Af Amer: 34 — ABNORMAL LOW
GFR calc non Af Amer: 28 — ABNORMAL LOW

## 2011-04-24 LAB — CBC
HCT: 28.9 — ABNORMAL LOW
HCT: 29.9 — ABNORMAL LOW
HCT: 31.3 — ABNORMAL LOW
HCT: 31.4 — ABNORMAL LOW
HCT: 23.1 — ABNORMAL LOW
HCT: 25 — ABNORMAL LOW
HCT: 26.1 — ABNORMAL LOW
HCT: 26.2 — ABNORMAL LOW
HCT: 26.6 — ABNORMAL LOW
HCT: 26.7 — ABNORMAL LOW
HCT: 27 — ABNORMAL LOW
HCT: 27.4 — ABNORMAL LOW
HCT: 30 — ABNORMAL LOW
HCT: 31.3 — ABNORMAL LOW
HCT: 31.5 — ABNORMAL LOW
HCT: 32.5 — ABNORMAL LOW
HCT: 32.5 — ABNORMAL LOW
Hemoglobin: 10.5 — ABNORMAL LOW
Hemoglobin: 9.7 — ABNORMAL LOW
Hemoglobin: 10 — ABNORMAL LOW
Hemoglobin: 10.7 — ABNORMAL LOW
Hemoglobin: 11 — ABNORMAL LOW
Hemoglobin: 8.1 — ABNORMAL LOW
Hemoglobin: 8.4 — ABNORMAL LOW
Hemoglobin: 8.6 — ABNORMAL LOW
Hemoglobin: 9 — ABNORMAL LOW
Hemoglobin: 9.3 — ABNORMAL LOW
MCHC: 33.2
MCHC: 33.3
MCHC: 33.4
MCHC: 33.1
MCHC: 33.1
MCHC: 33.2
MCHC: 33.2
MCHC: 33.3
MCHC: 33.4
MCHC: 33.4
MCHC: 33.4
MCHC: 33.4
MCHC: 33.7
MCHC: 33.8
MCV: 77.7 — ABNORMAL LOW
MCV: 78
MCV: 78.4
MCV: 79.1
MCV: 80.1
MCV: 80.2
MCV: 81.1
MCV: 82.6
MCV: 82.9
MCV: 83.1
MCV: 83.2
Platelets: 177
Platelets: 180
Platelets: 202
Platelets: 210
Platelets: 127 — ABNORMAL LOW
Platelets: 155
Platelets: 183
Platelets: 196
Platelets: 199
Platelets: 211
Platelets: 239
Platelets: 255
Platelets: 302
RBC: 3.69 — ABNORMAL LOW
RBC: 3.91
RBC: 3.99
RBC: 2.85 — ABNORMAL LOW
RBC: 2.94 — ABNORMAL LOW
RBC: 2.95 — ABNORMAL LOW
RBC: 3.1 — ABNORMAL LOW
RBC: 3.18 — ABNORMAL LOW
RBC: 3.21 — ABNORMAL LOW
RBC: 3.22 — ABNORMAL LOW
RBC: 3.25 — ABNORMAL LOW
RBC: 3.49 — ABNORMAL LOW
RBC: 3.74 — ABNORMAL LOW
RDW: 20.1 — ABNORMAL HIGH
RDW: 20.2 — ABNORMAL HIGH
RDW: 20.3 — ABNORMAL HIGH
RDW: 18.7 — ABNORMAL HIGH
RDW: 18.8 — ABNORMAL HIGH
RDW: 19.8 — ABNORMAL HIGH
RDW: 19.9 — ABNORMAL HIGH
RDW: 20.2 — ABNORMAL HIGH
RDW: 20.3 — ABNORMAL HIGH
RDW: 20.3 — ABNORMAL HIGH
WBC: 7.2
WBC: 7.8
WBC: 8.8
WBC: 10
WBC: 10.1
WBC: 10.2
WBC: 11.4 — ABNORMAL HIGH
WBC: 11.4 — ABNORMAL HIGH
WBC: 15 — ABNORMAL HIGH
WBC: 17.3 — ABNORMAL HIGH
WBC: 7.5
WBC: 8
WBC: 8.1
WBC: 8.4
WBC: 9.5

## 2011-04-24 LAB — POCT I-STAT 4, (NA,K, GLUC, HGB,HCT)
Glucose, Bld: 142 — ABNORMAL HIGH
Glucose, Bld: 148 — ABNORMAL HIGH
Glucose, Bld: 150 — ABNORMAL HIGH
Glucose, Bld: 155 — ABNORMAL HIGH
HCT: 20 — ABNORMAL LOW
HCT: 22 — ABNORMAL LOW
HCT: 25 — ABNORMAL LOW
HCT: 28 — ABNORMAL LOW
HCT: 33 — ABNORMAL LOW
Hemoglobin: 10.2 — ABNORMAL LOW
Hemoglobin: 11.2 — ABNORMAL LOW
Hemoglobin: 6.8 — CL
Hemoglobin: 8.5 — ABNORMAL LOW
Hemoglobin: 9.5 — ABNORMAL LOW
Operator id: 274841
Operator id: 3406
Operator id: 3406
Operator id: 3406
Operator id: 3406
Operator id: 3406
Potassium: 3.9
Potassium: 4
Potassium: 4.5
Potassium: 4.7
Potassium: 4.8
Sodium: 135
Sodium: 136
Sodium: 138
Sodium: 138
Sodium: 138
Sodium: 139

## 2011-04-24 LAB — PROTEIN ELECTROPH W RFLX QUANT IMMUNOGLOBULINS
Alpha-1-Globulin: 7 — ABNORMAL HIGH
Alpha-2-Globulin: 13.6 — ABNORMAL HIGH
Beta 2: 4.9
Beta Globulin: 6.4
Gamma Globulin: 12.7
M-Spike, %: NOT DETECTED

## 2011-04-24 LAB — COMPREHENSIVE METABOLIC PANEL
ALT: 17
ALT: 19
AST: 11
Albumin: 3.2 — ABNORMAL LOW
Albumin: 3.3 — ABNORMAL LOW
Albumin: 3.2 — ABNORMAL LOW
Alkaline Phosphatase: 67
BUN: 47 — ABNORMAL HIGH
BUN: 49 — ABNORMAL HIGH
BUN: 29 — ABNORMAL HIGH
Calcium: 8.9
Calcium: 9.1
Calcium: 9.5
Chloride: 106
Chloride: 105
Creatinine, Ser: 2.23 — ABNORMAL HIGH
Creatinine, Ser: 2.26 — ABNORMAL HIGH
Creatinine, Ser: 1.73 — ABNORMAL HIGH
GFR calc Af Amer: 26 — ABNORMAL LOW
GFR calc non Af Amer: 21 — ABNORMAL LOW
GFR calc non Af Amer: 29 — ABNORMAL LOW
Glucose, Bld: 190 — ABNORMAL HIGH
Potassium: 4.1
Sodium: 139
Total Bilirubin: 0.7
Total Bilirubin: 0.6
Total Protein: 5.9 — ABNORMAL LOW
Total Protein: 6

## 2011-04-24 LAB — DIFFERENTIAL
Eosinophils Absolute: 0.3
Eosinophils Absolute: 0.7
Eosinophils Relative: 4
Eosinophils Relative: 7 — ABNORMAL HIGH
Lymphocytes Relative: 26
Lymphocytes Relative: 31
Lymphs Abs: 1.8
Lymphs Abs: 1.7
Monocytes Absolute: 0.6
Monocytes Absolute: 1
Monocytes Relative: 6
Monocytes Relative: 7
Neutro Abs: 5.2
Neutrophils Relative %: 59

## 2011-04-24 LAB — HEPARIN LEVEL (UNFRACTIONATED)
Heparin Unfractionated: 0.15 — ABNORMAL LOW
Heparin Unfractionated: 0.29 — ABNORMAL LOW
Heparin Unfractionated: 0.37
Heparin Unfractionated: 0.42
Heparin Unfractionated: 0.59
Heparin Unfractionated: 0.39
Heparin Unfractionated: 0.58

## 2011-04-24 LAB — URINE CULTURE
Colony Count: 8000
Special Requests: NEGATIVE

## 2011-04-24 LAB — BASIC METABOLIC PANEL
BUN: 27 — ABNORMAL HIGH
BUN: 29 — ABNORMAL HIGH
BUN: 33 — ABNORMAL HIGH
BUN: 33 — ABNORMAL HIGH
BUN: 33 — ABNORMAL HIGH
BUN: 38 — ABNORMAL HIGH
CO2: 24
CO2: 25
CO2: 26
CO2: 26
CO2: 26
CO2: 27
CO2: 27
CO2: 29
Calcium: 9.1
Calcium: 8.4
Calcium: 8.5
Calcium: 8.8
Calcium: 8.8
Calcium: 8.8
Calcium: 9
Calcium: 9.3
Calcium: 9.3
Chloride: 100
Chloride: 102
Chloride: 104
Chloride: 105
Creatinine, Ser: 1.84 — ABNORMAL HIGH
Creatinine, Ser: 1.46 — ABNORMAL HIGH
Creatinine, Ser: 1.51 — ABNORMAL HIGH
Creatinine, Ser: 1.69 — ABNORMAL HIGH
Creatinine, Ser: 1.74 — ABNORMAL HIGH
Creatinine, Ser: 2.25 — ABNORMAL HIGH
Creatinine, Ser: 2.49 — ABNORMAL HIGH
GFR calc Af Amer: 33 — ABNORMAL LOW
GFR calc Af Amer: 23 — ABNORMAL LOW
GFR calc Af Amer: 26 — ABNORMAL LOW
GFR calc Af Amer: 30 — ABNORMAL LOW
GFR calc Af Amer: 34 — ABNORMAL LOW
GFR calc Af Amer: 34 — ABNORMAL LOW
GFR calc Af Amer: 36 — ABNORMAL LOW
GFR calc Af Amer: 41 — ABNORMAL LOW
GFR calc non Af Amer: 27 — ABNORMAL LOW
GFR calc non Af Amer: 19 — ABNORMAL LOW
GFR calc non Af Amer: 21 — ABNORMAL LOW
GFR calc non Af Amer: 28 — ABNORMAL LOW
GFR calc non Af Amer: 28 — ABNORMAL LOW
GFR calc non Af Amer: 29 — ABNORMAL LOW
GFR calc non Af Amer: 34 — ABNORMAL LOW
GFR calc non Af Amer: 35 — ABNORMAL LOW
GFR calc non Af Amer: 40 — ABNORMAL LOW
Glucose, Bld: 107 — ABNORMAL HIGH
Glucose, Bld: 111 — ABNORMAL HIGH
Glucose, Bld: 115 — ABNORMAL HIGH
Glucose, Bld: 121 — ABNORMAL HIGH
Glucose, Bld: 130 — ABNORMAL HIGH
Glucose, Bld: 133 — ABNORMAL HIGH
Glucose, Bld: 191 — ABNORMAL HIGH
Potassium: 3.8
Potassium: 3.9
Potassium: 4.2
Potassium: 4.3
Potassium: 4.6
Potassium: 4.7
Potassium: 4.9
Sodium: 135
Sodium: 137
Sodium: 138
Sodium: 139
Sodium: 139
Sodium: 143
Sodium: 143

## 2011-04-24 LAB — B-NATRIURETIC PEPTIDE (CONVERTED LAB)
Pro B Natriuretic peptide (BNP): 1018 — ABNORMAL HIGH
Pro B Natriuretic peptide (BNP): 1031 — ABNORMAL HIGH
Pro B Natriuretic peptide (BNP): 637 — ABNORMAL HIGH
Pro B Natriuretic peptide (BNP): 880 — ABNORMAL HIGH

## 2011-04-24 LAB — POCT I-STAT 3, ART BLOOD GAS (G3+)
Acid-Base Excess: 1
Acid-base deficit: 1
Bicarbonate: 21.6
Bicarbonate: 24.1 — ABNORMAL HIGH
Bicarbonate: 25.6 — ABNORMAL HIGH
O2 Saturation: 100
O2 Saturation: 95
Operator id: 209041
Operator id: 274841
Operator id: 274841
Operator id: 3406
Patient temperature: 36.6
Patient temperature: 36.7
Patient temperature: 36.8
TCO2: 22
TCO2: 23
TCO2: 25
TCO2: 27
pCO2 arterial: 28.2 — ABNORMAL LOW
pCO2 arterial: 35.4
pCO2 arterial: 37.2
pCO2 arterial: 38.9
pH, Arterial: 7.399
pH, Arterial: 7.407 — ABNORMAL HIGH
pH, Arterial: 7.446 — ABNORMAL HIGH
pH, Arterial: 7.492 — ABNORMAL HIGH
pO2, Arterial: 292 — ABNORMAL HIGH
pO2, Arterial: 68 — ABNORMAL LOW

## 2011-04-24 LAB — PROTIME-INR
INR: 1.2
INR: 1
INR: 1
INR: 1.1
INR: 1.1
INR: 1.1
INR: 1.1
INR: 1.3
Prothrombin Time: 13.7
Prothrombin Time: 14
Prothrombin Time: 14.4
Prothrombin Time: 14.5
Prothrombin Time: 14.7
Prothrombin Time: 16.2 — ABNORMAL HIGH

## 2011-04-24 LAB — URINALYSIS, ROUTINE W REFLEX MICROSCOPIC
Bilirubin Urine: NEGATIVE
Bilirubin Urine: NEGATIVE
Glucose, UA: NEGATIVE
Hgb urine dipstick: NEGATIVE
Hgb urine dipstick: NEGATIVE
Ketones, ur: NEGATIVE
Ketones, ur: NEGATIVE
Nitrite: NEGATIVE
Nitrite: NEGATIVE
Protein, ur: NEGATIVE
Protein, ur: NEGATIVE
Specific Gravity, Urine: 1.01
Urobilinogen, UA: 0.2
pH: 5

## 2011-04-24 LAB — BLOOD GAS, ARTERIAL
Acid-Base Excess: 2.8 — ABNORMAL HIGH
Bicarbonate: 26.2 — ABNORMAL HIGH
FIO2: 0.21
O2 Saturation: 95.2
Patient temperature: 98.6
TCO2: 27.3
pCO2 arterial: 36.2
pH, Arterial: 7.473 — ABNORMAL HIGH
pO2, Arterial: 72.1 — ABNORMAL LOW

## 2011-04-24 LAB — LIPID PANEL
HDL: 23 — ABNORMAL LOW
Triglycerides: 117
VLDL: 23

## 2011-04-24 LAB — POCT I-STAT, CHEM 8
Calcium, Ion: 1.22
Creatinine, Ser: 2.3 — ABNORMAL HIGH
HCT: 24 — ABNORMAL LOW
HCT: 27 — ABNORMAL LOW
Hemoglobin: 9.2 — ABNORMAL LOW
Potassium: 4.1
Sodium: 136
Sodium: 138
TCO2: 23
TCO2: 24

## 2011-04-24 LAB — CROSSMATCH
ABO/RH(D): O NEG
Antibody Screen: NEGATIVE

## 2011-04-24 LAB — HEMOGLOBIN A1C: Mean Plasma Glucose: 147

## 2011-04-24 LAB — RENAL FUNCTION PANEL
BUN: 41 — ABNORMAL HIGH
CO2: 25
Chloride: 106
Chloride: 111
Glucose, Bld: 117 — ABNORMAL HIGH
Glucose, Bld: 60 — ABNORMAL LOW
Phosphorus: 3.7
Phosphorus: 4
Potassium: 3.7
Potassium: 3.9
Sodium: 139
Sodium: 143

## 2011-04-24 LAB — IRON AND TIBC
Iron: 60
TIBC: 260
UIBC: 200

## 2011-04-24 LAB — URINE MICROSCOPIC-ADD ON

## 2011-04-24 LAB — HEMOGLOBIN AND HEMATOCRIT, BLOOD
HCT: 24 — ABNORMAL LOW
Hemoglobin: 8 — ABNORMAL LOW

## 2011-04-24 LAB — APTT
aPTT: 75 — ABNORMAL HIGH
aPTT: 33

## 2011-04-24 LAB — UIFE/LIGHT CHAINS/TP QN, 24-HR UR
Albumin, U: DETECTED
Alpha 2, Urine: NOT DETECTED
Beta, Urine: NOT DETECTED
Gamma Globulin, Urine: NOT DETECTED
Total Protein, Urine-Ur/day: 45 (ref 10–140)
Total Protein, Urine: 1.4

## 2011-04-24 LAB — OCCULT BLOOD X 1 CARD TO LAB, STOOL
Fecal Occult Bld: NEGATIVE
Fecal Occult Bld: NEGATIVE

## 2011-04-24 LAB — MAGNESIUM
Magnesium: 3 — ABNORMAL HIGH
Magnesium: 3.4 — ABNORMAL HIGH

## 2011-04-24 LAB — CREATININE, SERUM
Creatinine, Ser: 1.68 — ABNORMAL HIGH
GFR calc Af Amer: 26 — ABNORMAL LOW
GFR calc non Af Amer: 22 — ABNORMAL LOW

## 2011-04-24 LAB — PLATELET COUNT: Platelets: 160

## 2011-04-24 LAB — BLEEDING TIME: Bleeding Time: 12 — ABNORMAL HIGH

## 2011-04-24 LAB — FERRITIN: Ferritin: 162 (ref 10–291)

## 2011-04-24 LAB — PTH, INTACT AND CALCIUM: Calcium, Total (PTH): 8.7

## 2011-04-24 LAB — CARDIAC PANEL(CRET KIN+CKTOT+MB+TROPI): Troponin I: 0.02

## 2011-04-24 LAB — TSH: TSH: 2.369

## 2011-04-25 LAB — HEPARIN LEVEL (UNFRACTIONATED)
Heparin Unfractionated: 0.47
Heparin Unfractionated: 0.5
Heparin Unfractionated: 0.53
Heparin Unfractionated: 0.21 — ABNORMAL LOW
Heparin Unfractionated: 0.6

## 2011-04-25 LAB — DIFFERENTIAL
Basophils Absolute: 0
Basophils Absolute: 0
Basophils Absolute: 0
Basophils Absolute: 0.1
Basophils Relative: 0
Basophils Relative: 0
Basophils Relative: 1
Basophils Relative: 1
Eosinophils Absolute: 0.2
Eosinophils Absolute: 0.6
Eosinophils Absolute: 0.7
Eosinophils Absolute: 1.1 — ABNORMAL HIGH
Eosinophils Absolute: 0.7
Eosinophils Relative: 6 — ABNORMAL HIGH
Eosinophils Relative: 9 — ABNORMAL HIGH
Eosinophils Relative: 9 — ABNORMAL HIGH
Eosinophils Relative: 10 — ABNORMAL HIGH
Lymphocytes Relative: 16
Lymphocytes Relative: 18
Lymphocytes Relative: 22
Lymphs Abs: 0.8
Lymphs Abs: 1.3
Lymphs Abs: 1.5
Lymphs Abs: 1.5
Monocytes Absolute: 0.6
Monocytes Absolute: 0.5
Monocytes Relative: 7
Monocytes Relative: 8
Monocytes Relative: 8
Neutro Abs: 10.6 — ABNORMAL HIGH
Neutro Abs: 5.4
Neutro Abs: 5.5
Neutrophils Relative %: 58
Neutrophils Relative %: 69
Neutrophils Relative %: 69
Neutrophils Relative %: 82 — ABNORMAL HIGH

## 2011-04-25 LAB — BASIC METABOLIC PANEL
BUN: 10
BUN: 31 — ABNORMAL HIGH
BUN: 33 — ABNORMAL HIGH
BUN: 36 — ABNORMAL HIGH
BUN: 40 — ABNORMAL HIGH
BUN: 52 — ABNORMAL HIGH
BUN: 63 — ABNORMAL HIGH
CO2: 22
CO2: 22
CO2: 23
CO2: 23
CO2: 24
CO2: 25
CO2: 27
CO2: 31
Calcium: 8.6
Calcium: 8.7
Calcium: 8.7
Calcium: 8.8
Calcium: 8.9
Calcium: 9
Calcium: 9.1
Chloride: 101
Chloride: 103
Chloride: 105
Chloride: 107
Chloride: 102
Creatinine, Ser: 0.87
Creatinine, Ser: 1.52 — ABNORMAL HIGH
Creatinine, Ser: 1.58 — ABNORMAL HIGH
Creatinine, Ser: 1.61 — ABNORMAL HIGH
Creatinine, Ser: 1.63 — ABNORMAL HIGH
Creatinine, Ser: 1.64 — ABNORMAL HIGH
Creatinine, Ser: 1.99 — ABNORMAL HIGH
GFR calc Af Amer: 31 — ABNORMAL LOW
GFR calc Af Amer: 38 — ABNORMAL LOW
GFR calc Af Amer: 41 — ABNORMAL LOW
GFR calc Af Amer: >60
GFR calc Af Amer: 31 — ABNORMAL LOW
GFR calc non Af Amer: 26 — ABNORMAL LOW
GFR calc non Af Amer: 30 — ABNORMAL LOW
GFR calc non Af Amer: 31 — ABNORMAL LOW
GFR calc non Af Amer: 31 — ABNORMAL LOW
Glucose, Bld: 148 — ABNORMAL HIGH
Glucose, Bld: 151 — ABNORMAL HIGH
Glucose, Bld: 157 — ABNORMAL HIGH
Glucose, Bld: 164 — ABNORMAL HIGH
Glucose, Bld: 175 — ABNORMAL HIGH
Glucose, Bld: 177 — ABNORMAL HIGH
Potassium: 3.2 — ABNORMAL LOW
Potassium: 3.6
Potassium: 3.9
Potassium: 4
Potassium: 4.1
Potassium: 3.8
Sodium: 135
Sodium: 141

## 2011-04-25 LAB — CBC
HCT: 23.6 — ABNORMAL LOW
HCT: 28 — ABNORMAL LOW
HCT: 28.4 — ABNORMAL LOW
HCT: 29.6 — ABNORMAL LOW
HCT: 29.4 — ABNORMAL LOW
Hemoglobin: 10.1 — ABNORMAL LOW
Hemoglobin: 9.8 — ABNORMAL LOW
Hemoglobin: 10 — ABNORMAL LOW
Hemoglobin: 10.1 — ABNORMAL LOW
MCHC: 33.1
MCHC: 33.6
MCHC: 34
MCHC: 34.1
MCHC: 34.2
MCHC: 34.5
MCHC: 34.5
MCHC: 34.3
MCHC: 34.3
MCV: 82
MCV: 82.2
MCV: 82.6
MCV: 82.7
MCV: 82.8
MCV: 82.9
MCV: 82.1
Platelets: 251
Platelets: 251
Platelets: 271
Platelets: 272
RBC: 3.13 — ABNORMAL LOW
RBC: 3.43 — ABNORMAL LOW
RBC: 3.52 — ABNORMAL LOW
RBC: 3.58 — ABNORMAL LOW
RBC: 3.56 — ABNORMAL LOW
RBC: 3.58 — ABNORMAL LOW
RDW: 16.4 — ABNORMAL HIGH
RDW: 17 — ABNORMAL HIGH
RDW: 17 — ABNORMAL HIGH
RDW: 17.1 — ABNORMAL HIGH
WBC: 7.4
WBC: 9.2
WBC: 6.7
WBC: 6.9

## 2011-04-25 LAB — HEPATIC FUNCTION PANEL
Alkaline Phosphatase: 79
Indirect Bilirubin: 0.3
Total Protein: 5.7 — ABNORMAL LOW

## 2011-04-25 LAB — TYPE AND SCREEN

## 2011-04-25 LAB — H. PYLORI ANTIBODY, IGG: H Pylori IgG: <0.4

## 2011-04-25 LAB — IRON AND TIBC
Iron: 15 — ABNORMAL LOW
TIBC: 233 — ABNORMAL LOW
UIBC: 218

## 2011-04-25 LAB — PROTIME-INR
INR: 1.1
INR: 1.2
INR: 1.3
Prothrombin Time: 15.1
Prothrombin Time: 15.2
Prothrombin Time: 16 — ABNORMAL HIGH
Prothrombin Time: 16.4 — ABNORMAL HIGH

## 2011-04-25 LAB — WOUND CULTURE

## 2011-04-25 LAB — HEMOGLOBIN AND HEMATOCRIT, BLOOD
HCT: 29.6 — ABNORMAL LOW
Hemoglobin: 10.1 — ABNORMAL LOW
Hemoglobin: 7.7 — CL
Hemoglobin: 9.6 — ABNORMAL LOW

## 2011-04-25 LAB — COMPREHENSIVE METABOLIC PANEL
ALT: 11
AST: 12
Albumin: 2.9 — ABNORMAL LOW
CO2: 29
Calcium: 9
GFR calc Af Amer: 33 — ABNORMAL LOW
GFR calc non Af Amer: 27 — ABNORMAL LOW
Sodium: 139

## 2011-04-25 LAB — PHOSPHORUS: Phosphorus: 3.7

## 2011-04-25 LAB — POCT CARDIAC MARKERS: Troponin i, poc: <0.05

## 2011-04-25 LAB — RETICULOCYTES
RBC.: 2.87 — ABNORMAL LOW
Retic Ct Pct: 2.4

## 2011-04-25 LAB — OCCULT BLOOD X 1 CARD TO LAB, STOOL
Fecal Occult Bld: NEGATIVE
Fecal Occult Bld: NEGATIVE

## 2011-04-25 LAB — CULTURE, BLOOD (ROUTINE X 2)

## 2011-04-25 LAB — B-NATRIURETIC PEPTIDE (CONVERTED LAB)
Pro B Natriuretic peptide (BNP): 300 — ABNORMAL HIGH
Pro B Natriuretic peptide (BNP): 438 — ABNORMAL HIGH
Pro B Natriuretic peptide (BNP): 470 — ABNORMAL HIGH
Pro B Natriuretic peptide (BNP): 385 — ABNORMAL HIGH

## 2011-04-25 LAB — HEMOGLOBIN A1C
Hgb A1c MFr Bld: 5.6
Mean Plasma Glucose: 122

## 2011-04-26 LAB — BASIC METABOLIC PANEL
CO2: 31
Calcium: 8.6
GFR calc Af Amer: 27 — ABNORMAL LOW
Glucose, Bld: 321 — ABNORMAL HIGH
Potassium: 2.8 — ABNORMAL LOW
Sodium: 134 — ABNORMAL LOW

## 2011-04-26 LAB — HEMOGLOBIN AND HEMATOCRIT, BLOOD: HCT: 37.5

## 2011-04-27 LAB — BASIC METABOLIC PANEL
BUN: 46 — ABNORMAL HIGH
CO2: 25
Glucose, Bld: 104 — ABNORMAL HIGH
Potassium: 4.3
Sodium: 139

## 2011-04-27 LAB — POCT I-STAT 4, (NA,K, GLUC, HGB,HCT)
Glucose, Bld: 99
HCT: 43
Operator id: 215841
Sodium: 140

## 2011-04-28 ENCOUNTER — Ambulatory Visit (HOSPITAL_COMMUNITY)
Admission: RE | Admit: 2011-04-28 | Discharge: 2011-04-28 | Disposition: A | Discharge status: Home / Self Care | Payer: Medicare HMO | Source: Ambulatory Visit | Attending: Cardiovascular Disease | Admitting: Cardiovascular Disease

## 2011-04-28 DIAGNOSIS — I4891 Unspecified atrial fibrillation: Secondary | ICD-10-CM | POA: Insufficient documentation

## 2011-05-05 LAB — TYPE AND SCREEN

## 2011-05-05 LAB — URINALYSIS, ROUTINE W REFLEX MICROSCOPIC
Bilirubin Urine: NEGATIVE
Hgb urine dipstick: NEGATIVE
Ketones, ur: NEGATIVE
Specific Gravity, Urine: 1.03
pH: 5

## 2011-05-05 LAB — CBC
HCT: 25.8 — ABNORMAL LOW
HCT: 33.3 — ABNORMAL LOW
Hemoglobin: 11 — ABNORMAL LOW
Hemoglobin: 8 — ABNORMAL LOW
MCHC: 31.2
MCHC: 33
MCHC: 33.1
MCV: 72 — ABNORMAL LOW
Platelets: 291
Platelets: 307
RBC: 3.59 — ABNORMAL LOW
RDW: 16.9 — ABNORMAL HIGH
RDW: 17.5 — ABNORMAL HIGH
WBC: 8

## 2011-05-05 LAB — CARDIAC PANEL(CRET KIN+CKTOT+MB+TROPI)
CK, MB: 1.6
CK, MB: 1.6
Relative Index: INVALID
Relative Index: INVALID
Relative Index: INVALID
Total CK: 35
Total CK: 37
Troponin I: 0.06
Troponin I: 0.09 — ABNORMAL HIGH

## 2011-05-05 LAB — FOLATE: Folate: 8.2

## 2011-05-05 LAB — B-NATRIURETIC PEPTIDE (CONVERTED LAB)
Pro B Natriuretic peptide (BNP): 310 — ABNORMAL HIGH
Pro B Natriuretic peptide (BNP): 664 — ABNORMAL HIGH
Pro B Natriuretic peptide (BNP): 879 — ABNORMAL HIGH
Pro B Natriuretic peptide (BNP): 959 — ABNORMAL HIGH

## 2011-05-05 LAB — BASIC METABOLIC PANEL
BUN: 17
BUN: 24 — ABNORMAL HIGH
BUN: 25 — ABNORMAL HIGH
CO2: 21
CO2: 27
Calcium: 8.9
Calcium: 8.9
Calcium: 8.9
Chloride: 109
Creatinine, Ser: 1.32 — ABNORMAL HIGH
Creatinine, Ser: 1.42 — ABNORMAL HIGH
GFR calc Af Amer: 47 — ABNORMAL LOW
GFR calc non Af Amer: 34 — ABNORMAL LOW
GFR calc non Af Amer: 36 — ABNORMAL LOW
GFR calc non Af Amer: 40 — ABNORMAL LOW
Glucose, Bld: 131 — ABNORMAL HIGH
Glucose, Bld: 133 — ABNORMAL HIGH
Glucose, Bld: 94
Potassium: 3.1 — ABNORMAL LOW
Potassium: 3.8
Potassium: 4
Sodium: 139
Sodium: 141

## 2011-05-05 LAB — DIFFERENTIAL
Band Neutrophils: 0
Basophils Absolute: 0
Basophils Absolute: 0
Basophils Relative: 0
Basophils Relative: 1
Basophils Relative: 1
Eosinophils Relative: 1
Eosinophils Relative: 7 — ABNORMAL HIGH
Lymphocytes Relative: 23
Lymphocytes Relative: 28
Metamyelocytes Relative: 0
Monocytes Absolute: 0.7
Myelocytes: 0
Neutro Abs: 4.4
Neutrophils Relative %: 83 — ABNORMAL HIGH

## 2011-05-05 LAB — HEMOGLOBIN AND HEMATOCRIT, BLOOD: Hemoglobin: 9.8 — ABNORMAL LOW

## 2011-05-05 LAB — RETICULOCYTES
Retic Count, Absolute: 63.9
Retic Ct Pct: 1.7

## 2011-05-05 LAB — URINE MICROSCOPIC-ADD ON

## 2011-05-05 LAB — URINE CULTURE

## 2011-05-05 LAB — IRON AND TIBC
Iron: 21 — ABNORMAL LOW
UIBC: 220

## 2011-05-08 LAB — BASIC METABOLIC PANEL
BUN: 16
CO2: 23
Calcium: 8.7
Creatinine, Ser: 1.26 — ABNORMAL HIGH
GFR calc non Af Amer: 42 — ABNORMAL LOW
Glucose, Bld: 138 — ABNORMAL HIGH

## 2011-05-10 LAB — CBC
MCHC: 33.7
MCV: 76 — ABNORMAL LOW
Platelets: 238
RBC: 3.69 — ABNORMAL LOW
RDW: 14.3 — ABNORMAL HIGH
RDW: 14.5 — ABNORMAL HIGH

## 2011-05-10 LAB — BASIC METABOLIC PANEL
BUN: 28 — ABNORMAL HIGH
CO2: 24
Calcium: 8.4
Chloride: 107
Creatinine, Ser: 1.54 — ABNORMAL HIGH
GFR calc Af Amer: 40 — ABNORMAL LOW
Glucose, Bld: 155 — ABNORMAL HIGH

## 2011-05-11 LAB — URINE CULTURE: Colony Count: 80000

## 2011-05-11 LAB — PROTIME-INR
INR: 1
Prothrombin Time: 12.9

## 2011-05-11 LAB — COMPREHENSIVE METABOLIC PANEL
ALT: 17
AST: 19
CO2: 27
Calcium: 9.7
Chloride: 102
Creatinine, Ser: 1.46 — ABNORMAL HIGH
GFR calc Af Amer: 43 — ABNORMAL LOW
GFR calc non Af Amer: 35 — ABNORMAL LOW
Glucose, Bld: 130 — ABNORMAL HIGH
Total Bilirubin: 0.6

## 2011-05-11 LAB — URINALYSIS, ROUTINE W REFLEX MICROSCOPIC
Glucose, UA: NEGATIVE
Glucose, UA: NEGATIVE
Hgb urine dipstick: NEGATIVE
Ketones, ur: NEGATIVE
Ketones, ur: NEGATIVE
Protein, ur: NEGATIVE
Protein, ur: NEGATIVE
Urobilinogen, UA: 0.2
Urobilinogen, UA: 0.2

## 2011-05-11 LAB — DIFFERENTIAL
Basophils Absolute: 0.1
Eosinophils Absolute: 0.2
Eosinophils Relative: 2
Lymphocytes Relative: 28
Lymphs Abs: 2.8
Neutrophils Relative %: 64

## 2011-05-11 LAB — CROSSMATCH
ABO/RH(D): O NEG
Antibody Screen: NEGATIVE

## 2011-05-11 LAB — BASIC METABOLIC PANEL
BUN: 32 — ABNORMAL HIGH
CO2: 25
Calcium: 8.5
Chloride: 106
Creatinine, Ser: 1.6 — ABNORMAL HIGH
GFR calc Af Amer: 38 — ABNORMAL LOW
Glucose, Bld: 193 — ABNORMAL HIGH

## 2011-05-11 LAB — ABO/RH: ABO/RH(D): O NEG

## 2011-05-11 LAB — CBC
HCT: 34.5 — ABNORMAL LOW
MCHC: 33
MCV: 77.1 — ABNORMAL LOW
RBC: 3.4 — ABNORMAL LOW
RDW: 13.9

## 2011-05-11 LAB — URINE MICROSCOPIC-ADD ON

## 2011-08-07 ENCOUNTER — Other Ambulatory Visit (HOSPITAL_COMMUNITY): Payer: Self-pay | Admitting: Cardiovascular Disease

## 2011-08-07 DIAGNOSIS — R0989 Other specified symptoms and signs involving the circulatory and respiratory systems: Secondary | ICD-10-CM

## 2011-08-10 ENCOUNTER — Ambulatory Visit (HOSPITAL_COMMUNITY)
Admission: RE | Admit: 2011-08-10 | Discharge: 2011-08-10 | Disposition: A | Discharge status: Home / Self Care | Payer: Medicare HMO | Source: Ambulatory Visit | Attending: Cardiovascular Disease | Admitting: Cardiovascular Disease

## 2011-08-10 DIAGNOSIS — R0989 Other specified symptoms and signs involving the circulatory and respiratory systems: Secondary | ICD-10-CM | POA: Insufficient documentation

## 2011-08-10 DIAGNOSIS — I10 Essential (primary) hypertension: Secondary | ICD-10-CM | POA: Insufficient documentation

## 2011-08-10 DIAGNOSIS — I6529 Occlusion and stenosis of unspecified carotid artery: Secondary | ICD-10-CM | POA: Insufficient documentation

## 2011-08-10 DIAGNOSIS — E119 Type 2 diabetes mellitus without complications: Secondary | ICD-10-CM | POA: Insufficient documentation

## 2011-08-10 DIAGNOSIS — I2510 Atherosclerotic heart disease of native coronary artery without angina pectoris: Secondary | ICD-10-CM | POA: Insufficient documentation

## 2011-08-10 DIAGNOSIS — R55 Syncope and collapse: Secondary | ICD-10-CM | POA: Insufficient documentation

## 2011-08-10 DIAGNOSIS — Z951 Presence of aortocoronary bypass graft: Secondary | ICD-10-CM | POA: Insufficient documentation

## 2012-08-06 ENCOUNTER — Ambulatory Visit (HOSPITAL_COMMUNITY)
Admission: RE | Admit: 2012-08-06 | Discharge: 2012-08-06 | Disposition: A | Discharge status: Home / Self Care | Payer: Medicare Other | Source: Ambulatory Visit | Attending: Cardiovascular Disease | Admitting: Cardiovascular Disease

## 2012-08-06 ENCOUNTER — Other Ambulatory Visit (HOSPITAL_COMMUNITY): Payer: Self-pay | Admitting: Cardiovascular Disease

## 2012-08-06 DIAGNOSIS — R0602 Shortness of breath: Secondary | ICD-10-CM

## 2012-08-31 HISTORY — PX: TRANSTHORACIC ECHOCARDIOGRAM: SHX275

## 2012-09-02 ENCOUNTER — Ambulatory Visit (HOSPITAL_COMMUNITY)
Admission: RE | Admit: 2012-09-02 | Discharge: 2012-09-02 | Disposition: A | Discharge status: Home / Self Care | Payer: Medicare Other | Source: Ambulatory Visit | Attending: Cardiovascular Disease | Admitting: Cardiovascular Disease

## 2012-09-02 DIAGNOSIS — E119 Type 2 diabetes mellitus without complications: Secondary | ICD-10-CM | POA: Insufficient documentation

## 2012-09-02 DIAGNOSIS — I10 Essential (primary) hypertension: Secondary | ICD-10-CM | POA: Insufficient documentation

## 2012-09-02 DIAGNOSIS — I4891 Unspecified atrial fibrillation: Secondary | ICD-10-CM | POA: Insufficient documentation

## 2012-09-02 NOTE — Progress Notes (Signed)
*  PRELIMINARY RESULTS* Echocardiogram 2D Echocardiogram has been performed.  [place] 09/02/2012, 1:26 PM

## 2012-11-07 ENCOUNTER — Telehealth: Payer: Self-pay | Admitting: Gastroenterology

## 2012-11-07 NOTE — Telephone Encounter (Signed)
PT SEEN BY DR. Alanda Amass. NEEDS APPT WITHIN THE NEXT WEEK FOR ANEMIA/COLONOSCOPY WITH DR. Jackquline Branca E30. PT REQUESTING ALTERNATIVE TO 2-4L PEG PREP. DR Alanda Amass STATED IT'S OK TO STOP HER COUMADIN.  PLEASE CONTACT PT WITH APPT-[phone].

## 2012-11-07 NOTE — Telephone Encounter (Signed)
Pt is aware of OV on 4/16 at 1230 with SF

## 2012-11-13 ENCOUNTER — Encounter (HOSPITAL_COMMUNITY): Payer: Self-pay | Admitting: Pharmacy Technician

## 2012-11-13 ENCOUNTER — Other Ambulatory Visit: Payer: Self-pay | Admitting: Gastroenterology

## 2012-11-13 ENCOUNTER — Encounter: Payer: Self-pay | Admitting: Gastroenterology

## 2012-11-13 ENCOUNTER — Ambulatory Visit (INDEPENDENT_AMBULATORY_CARE_PROVIDER_SITE_OTHER): Payer: Medicare Other | Admitting: Gastroenterology

## 2012-11-13 VITALS — BP 138/76 | HR 92 | Temp 98.4°F | Ht 64.0 in | Wt 259.0 lb

## 2012-11-13 DIAGNOSIS — D649 Anemia, unspecified: Secondary | ICD-10-CM

## 2012-11-13 MED ORDER — POLYETHYLENE GLYCOL 3350 17 GM/SCOOP PO POWD: ORAL | Status: DC

## 2012-11-13 NOTE — Progress Notes (Signed)
Cc PCP 

## 2012-11-13 NOTE — Patient Instructions (Addendum)
COLONOSCOPY AND UPPER ENDOSCOPY WITHIN THE NEXT 2 WEEKS TO EVALUATE YOUR ANEMIA.   PLAN FOR COLONOSCOPY AND UPPER ENDOSCOPY ON APR 28 WITH THE FOLLOWING PREP INSTRUCTIONS:  1. START A FULL LIQUID DIET ON APR 26 PRIOR TO YOUR COLONOSCOPY.  2. ON APR 26 START MIRALAX 1 CAP IN 8 OZ OF CLEAR LIQUID AT 12 NOON. REPEAT EVERY HOUR FOR 6 HOURS ([id number]). DRINK 8 OZ(1 CUP) OF CLEAR LIQUID 30 MINUTES AFTER EACH DOSE.  3. CHANGE TO A CLEAR LIQUID DIET ON APR 27.   4. ON APR 27 CONTINUE MIRALAX 1 CAP IN 8 OZ OF CLEAR LIQUID AT 12 NOON. REPEAT EVERY HOUR FOR 6 HOURS ([id number]). DRINK 8 OZ(1 CUP) OF CLEAR LIQUID 30 MINUTES AFTER EACH DOSE. USE DULCOLAX PILLS 10 MG AT 3 PM AND 5 PM.  5. NOTHING TO EAT OR DRINK AFTER MIDNIGHT.  6. USE AN ENEMA THE MORNING OF YOUR PROCEDURE.  7. ON APR 27 TAKE HALF YOUR GLIPIZIDE ON APR 28-HOLD YOUR GLIPIZIDE.  8. HOLD YOUR COUMADIN ON APR 24-5 DAYS PRIOR TO YOUR PROCEDURES.

## 2012-11-13 NOTE — Progress Notes (Signed)
Subjective:    Patient ID: [name], female    DOB: 12/31/1932, 79 y.o.   MRN: [id number]  PCP: [facility] CARDIOLOGIST: WEINTRAUB  HPI PT LAST SEEN AND EVALUATED BY [facility] IN 2008-09 FOR ANEMIA WHILE ON COUMADIN FOR AFIB. EGD FEB 2009 SHOWED MILD GASTRITIS. TRIED TO DRINK 4L PREP AND UNABLE TO TOLERATE UDE TO BLOOD SUGARS DROPPING AND NAUSEA/VOMITING. CONCERNED SHE WILL NOT BE ABLE TO TAKE A BOWEL PREP. PT MAY HAVE PROBLEMS WITH CONSTIPATION.  PT DENIES FEVER, CHILLS, BRBPR, nausea, vomiting, melena, diarrhea, abd pain, problems swallowing, problems with sedation, heartburn or indigestion.  Past Medical History  Diagnosis Date  . Anemia 2008  . CRI (chronic renal insufficiency) 2008  . DM (diabetes mellitus) 1999  . Cataract     BIL  . Atrial fibrillation   . CAD (coronary artery disease)     Past Surgical History  Procedure Laterality Date  . Esophagogastroduodenoscopy  11/07/2007    SLF:Patent Schatzki's ring.  Otherwise normal esophagus   . Appendectomy      APPENDICITIS  . Coronary artery bypass graft  MAR 2009  . Pacemaker insertion  MAR 2009    No Known Allergies  Current Outpatient Prescriptions  Medication Sig Dispense Refill  . amiodarone (PACERONE) 200 MG tablet Take 200 mg by mouth daily.       . aspirin 81 MG tablet Take 81 mg by mouth daily.      . citalopram (CELEXA) 10 MG tablet Take 10 mg by mouth every morning.       . enalapril (VASOTEC) 10 MG tablet Take 10 mg by mouth daily.       . furosemide (LASIX) 20 MG tablet Take 80 mg by mouth daily.       . GLIPIZIDE XL 10 MG 24 hr tablet Take 10 mg by mouth daily.       . levothyroxine (SYNTHROID, LEVOTHROID) 125 MCG tablet Take 125 mcg by mouth daily before breakfast.       . oxybutynin (DITROPAN) 5 MG tablet Take 5 mg by mouth 2 (two) times daily.       . pantoprazole (PROTONIX) 40 MG tablet Take 40 mg by mouth daily.       . potassium chloride SA (K-DUR,KLOR-CON) 20 MEQ tablet Take 20 mEq by mouth  daily.       . pravastatin (PRAVACHOL) 40 MG tablet Take 40 mg by mouth daily.       . warfarin (COUMADIN) 5 MG tablet Take 5 mg by mouth daily. 2.5 mg for 5 days then 5 mg for two days       No current facility-administered medications for this visit.    Family History  Problem Relation Age of Onset  . Colon cancer Neg Hx   . Colon polyps Neg Hx     History  Substance Use Topics  . Smoking status: Never Smoker   . Smokeless tobacco: Not on file  . Alcohol Use: No   Review of Systems LABS REVIEWED FROM 2008 TO 2013: HB 7.7 TO 14.6, CR 1.5-2.2, FERRITIN > 100. JAN 2014: HB 11-11.7, MCV 82.2-83.9MAR 2014: HB 10.7 MCV 82.3 , CR 1.78, ALB 3.7 FERRITIN 119 B12 393 TIBC 311  PER HPI OTHERWISE ALL SYSTEMS ARE NEGATIVE.     Objective:   Physical Exam  Vitals reviewed. Constitutional: She is oriented to person, place, and time. She appears well-nourished. No distress.  HENT:  Head: Normocephalic and atraumatic.  Mouth/Throat: Oropharynx is clear and   moist. No oropharyngeal exudate.  Eyes: Pupils are equal, round, and reactive to light. Right eye exhibits no discharge. No scleral icterus.  Neck: Normal range of motion. Neck supple.  Cardiovascular: Normal rate and normal heart sounds.   Pulmonary/Chest: Effort normal and breath sounds normal. No respiratory distress.  Abdominal: Soft. Bowel sounds are normal. She exhibits no distension. There is no tenderness. There is no rebound.  OBESE   Musculoskeletal: She exhibits edema.  LROM. WALKS ASSISTED WITH A CANE.  Lymphadenopathy:    She has no cervical adenopathy.  Neurological: She is alert and oriented to person, place, and time.  NO FOCAL DEFICITS   Psychiatric: She has a normal mood and affect.          Assessment & Plan:   

## 2012-11-13 NOTE — Addendum Note (Signed)
Addended by: [place] on: 11/13/2012 01:39 PM   Modules accepted: Level of Service

## 2012-11-13 NOTE — Addendum Note (Signed)
Addended by: [place] on: 11/13/2012 01:43 PM   Modules accepted: Orders

## 2012-11-13 NOTE — Assessment & Plan Note (Signed)
HAD LIMITED WORKUP IN 2009 DUE TO INABLITY TO TOLERATE PREP. SX MOST LIKELY DUE TO CHRONIC DISEASE BUT LOW HB MAY BE EXACERBATED BUY COLON POLYPS OR H PYLORI GASTRITIS.  TCS/EGD WITHIN THE NEXT 2 WEEKS WITH MIRALAX PREP OPV TBD AFTER ENDOSCOPY

## 2012-11-15 ENCOUNTER — Encounter: Payer: Self-pay | Admitting: Pharmacist Clinician (PhC)/ Clinical Pharmacy Specialist

## 2012-11-15 DIAGNOSIS — I4891 Unspecified atrial fibrillation: Secondary | ICD-10-CM

## 2012-11-15 DIAGNOSIS — I4821 Permanent atrial fibrillation: Secondary | ICD-10-CM | POA: Insufficient documentation

## 2012-11-15 DIAGNOSIS — Z7901 Long term (current) use of anticoagulants: Secondary | ICD-10-CM

## 2012-11-18 ENCOUNTER — Encounter: Payer: Self-pay | Admitting: *Deleted

## 2012-11-19 ENCOUNTER — Inpatient Hospital Stay (HOSPITAL_COMMUNITY)
Admission: EM | Admit: 2012-11-19 | Discharge: 2012-11-21 | DRG: 292 | Disposition: A | Discharge status: Home Health | Payer: Medicare Other | Attending: Internal Medicine | Admitting: Internal Medicine

## 2012-11-19 ENCOUNTER — Emergency Department (HOSPITAL_COMMUNITY): Payer: Medicare Other

## 2012-11-19 ENCOUNTER — Telehealth: Payer: Self-pay | Admitting: Gastroenterology

## 2012-11-19 ENCOUNTER — Encounter (HOSPITAL_COMMUNITY): Payer: Self-pay | Admitting: *Deleted

## 2012-11-19 DIAGNOSIS — E119 Type 2 diabetes mellitus without complications: Secondary | ICD-10-CM

## 2012-11-19 DIAGNOSIS — N183 Chronic kidney disease, stage 3 unspecified: Secondary | ICD-10-CM | POA: Diagnosis present

## 2012-11-19 DIAGNOSIS — K922 Gastrointestinal hemorrhage, unspecified: Secondary | ICD-10-CM

## 2012-11-19 DIAGNOSIS — Z79899 Other long term (current) drug therapy: Secondary | ICD-10-CM

## 2012-11-19 DIAGNOSIS — I509 Heart failure, unspecified: Secondary | ICD-10-CM | POA: Diagnosis present

## 2012-11-19 DIAGNOSIS — D649 Anemia, unspecified: Secondary | ICD-10-CM | POA: Diagnosis present

## 2012-11-19 DIAGNOSIS — Z6841 Body Mass Index (BMI) 40.0 and over, adult: Secondary | ICD-10-CM

## 2012-11-19 DIAGNOSIS — Z7901 Long term (current) use of anticoagulants: Secondary | ICD-10-CM

## 2012-11-19 DIAGNOSIS — I4891 Unspecified atrial fibrillation: Secondary | ICD-10-CM

## 2012-11-19 DIAGNOSIS — Z7982 Long term (current) use of aspirin: Secondary | ICD-10-CM

## 2012-11-19 DIAGNOSIS — I2510 Atherosclerotic heart disease of native coronary artery without angina pectoris: Secondary | ICD-10-CM | POA: Diagnosis present

## 2012-11-19 DIAGNOSIS — I129 Hypertensive chronic kidney disease with stage 1 through stage 4 chronic kidney disease, or unspecified chronic kidney disease: Secondary | ICD-10-CM | POA: Diagnosis present

## 2012-11-19 DIAGNOSIS — I4821 Permanent atrial fibrillation: Secondary | ICD-10-CM | POA: Diagnosis present

## 2012-11-19 DIAGNOSIS — Z951 Presence of aortocoronary bypass graft: Secondary | ICD-10-CM

## 2012-11-19 DIAGNOSIS — Z950 Presence of cardiac pacemaker: Secondary | ICD-10-CM

## 2012-11-19 DIAGNOSIS — I5033 Acute on chronic diastolic (congestive) heart failure: Principal | ICD-10-CM | POA: Diagnosis present

## 2012-11-19 DIAGNOSIS — E669 Obesity, unspecified: Secondary | ICD-10-CM | POA: Diagnosis present

## 2012-11-19 LAB — COMPREHENSIVE METABOLIC PANEL
AST: 17 U/L (ref 0–37)
CO2: 25 mEq/L (ref 19–32)
Calcium: 9.1 mg/dL (ref 8.4–10.5)
Creatinine, Ser: 1.61 mg/dL — ABNORMAL HIGH (ref 0.50–1.10)
GFR calc Af Amer: 34 mL/min — ABNORMAL LOW (ref >90)
GFR calc non Af Amer: 30 mL/min — ABNORMAL LOW (ref >90)
Glucose, Bld: 153 mg/dL — ABNORMAL HIGH (ref 70–99)

## 2012-11-19 LAB — HEMOGLOBIN AND HEMATOCRIT, BLOOD: HCT: 32.2 % — ABNORMAL LOW (ref 36.0–46.0)

## 2012-11-19 LAB — CBC WITH DIFFERENTIAL/PLATELET
Basophils Absolute: 0 10*3/uL (ref 0.0–0.1)
Eosinophils Relative: 3 % (ref 0–5)
HCT: 33.6 % — ABNORMAL LOW (ref 36.0–46.0)
Lymphocytes Relative: 22 % (ref 12–46)
MCV: 85.9 fL (ref 78.0–100.0)
Monocytes Absolute: 0.4 10*3/uL (ref 0.1–1.0)
RDW: 15.9 % — ABNORMAL HIGH (ref 11.5–15.5)
WBC: 5.5 10*3/uL (ref 4.0–10.5)

## 2012-11-19 LAB — PROTIME-INR: Prothrombin Time: 29.1 seconds — ABNORMAL HIGH (ref 11.6–15.2)

## 2012-11-19 LAB — APTT: aPTT: 44 seconds — ABNORMAL HIGH (ref 24–37)

## 2012-11-19 LAB — TROPONIN I: Troponin I: <0.3 ng/mL (ref <0.30)

## 2012-11-19 LAB — TYPE AND SCREEN: ABO/RH(D): O NEG

## 2012-11-19 MED ORDER — POTASSIUM CHLORIDE CRYS ER 20 MEQ PO TBCR
40.0000 meq | EXTENDED_RELEASE_TABLET | Freq: Two times a day (BID) | ORAL | Status: DC
  Administered 2012-11-19 – 2012-11-21 (×4): 40 meq via ORAL
  Filled 2012-11-19 (×4): qty 2

## 2012-11-19 MED ORDER — SODIUM CHLORIDE 0.9 % IJ SOLN
3.0000 mL | Freq: Two times a day (BID) | INTRAMUSCULAR | Status: DC
  Administered 2012-11-19 – 2012-11-21 (×4): 3 mL via INTRAVENOUS

## 2012-11-19 MED ORDER — ASPIRIN 81 MG PO CHEW
162.0000 mg | CHEWABLE_TABLET | Freq: Every day | ORAL | Status: DC
  Administered 2012-11-20 – 2012-11-21 (×2): 162 mg via ORAL
  Filled 2012-11-19 (×3): qty 2

## 2012-11-19 MED ORDER — ALLOPURINOL 100 MG PO TABS
100.0000 mg | ORAL_TABLET | Freq: Every morning | ORAL | Status: DC
  Administered 2012-11-20 – 2012-11-21 (×2): 100 mg via ORAL
  Filled 2012-11-19 (×2): qty 1

## 2012-11-19 MED ORDER — GLIPIZIDE ER 10 MG PO TB24
10.0000 mg | ORAL_TABLET | Freq: Every day | ORAL | Status: DC
  Administered 2012-11-20 – 2012-11-21 (×2): 10 mg via ORAL
  Filled 2012-11-19 (×2): qty 1

## 2012-11-19 MED ORDER — CITALOPRAM HYDROBROMIDE 20 MG PO TABS
10.0000 mg | ORAL_TABLET | Freq: Every day | ORAL | Status: DC
  Administered 2012-11-20 – 2012-11-21 (×2): 10 mg via ORAL
  Filled 2012-11-19: qty 2
  Filled 2012-11-19: qty 1

## 2012-11-19 MED ORDER — SODIUM CHLORIDE 0.9 % IJ SOLN: 3.0000 mL | INTRAMUSCULAR | Status: DC | PRN

## 2012-11-19 MED ORDER — ACETAMINOPHEN 325 MG PO TABS: 650.0000 mg | ORAL_TABLET | ORAL | Status: DC | PRN

## 2012-11-19 MED ORDER — PANTOPRAZOLE SODIUM 40 MG PO TBEC
40.0000 mg | DELAYED_RELEASE_TABLET | Freq: Every morning | ORAL | Status: DC
  Administered 2012-11-20 – 2012-11-21 (×2): 40 mg via ORAL
  Filled 2012-11-19 (×2): qty 1

## 2012-11-19 MED ORDER — LEVOTHYROXINE SODIUM 25 MCG PO TABS
125.0000 ug | ORAL_TABLET | Freq: Every day | ORAL | Status: DC
  Administered 2012-11-20 – 2012-11-21 (×2): 125 ug via ORAL
  Filled 2012-11-19 (×2): qty 1

## 2012-11-19 MED ORDER — TRAMADOL HCL 50 MG PO TABS: 50.0000 mg | ORAL_TABLET | ORAL | Status: DC | PRN

## 2012-11-19 MED ORDER — FUROSEMIDE 10 MG/ML IJ SOLN
80.0000 mg | Freq: Two times a day (BID) | INTRAMUSCULAR | Status: DC
  Administered 2012-11-19 – 2012-11-21 (×4): 80 mg via INTRAVENOUS
  Filled 2012-11-19 (×4): qty 8

## 2012-11-19 MED ORDER — AMIODARONE HCL 200 MG PO TABS
200.0000 mg | ORAL_TABLET | Freq: Every morning | ORAL | Status: DC
  Administered 2012-11-20 – 2012-11-21 (×2): 200 mg via ORAL
  Filled 2012-11-19 (×2): qty 1

## 2012-11-19 MED ORDER — POLYSACCHARIDE IRON COMPLEX 150 MG PO CAPS
150.0000 mg | ORAL_CAPSULE | Freq: Two times a day (BID) | ORAL | Status: DC
  Administered 2012-11-19 – 2012-11-21 (×4): 150 mg via ORAL
  Filled 2012-11-19 (×4): qty 1

## 2012-11-19 MED ORDER — INSULIN ASPART 100 UNIT/ML ~~LOC~~ SOLN: 0.0000 [IU] | Freq: Three times a day (TID) | SUBCUTANEOUS | Status: DC

## 2012-11-19 MED ORDER — ENALAPRIL MALEATE 5 MG PO TABS
10.0000 mg | ORAL_TABLET | Freq: Every morning | ORAL | Status: DC
  Administered 2012-11-20 – 2012-11-21 (×2): 10 mg via ORAL
  Filled 2012-11-19 (×2): qty 2

## 2012-11-19 MED ORDER — ONDANSETRON HCL 4 MG/2ML IJ SOLN: 4.0000 mg | INTRAMUSCULAR | Status: DC | PRN

## 2012-11-19 MED ORDER — MAGNESIUM OXIDE 400 (241.3 MG) MG PO TABS
400.0000 mg | ORAL_TABLET | Freq: Every morning | ORAL | Status: DC
  Administered 2012-11-20 – 2012-11-21 (×2): 400 mg via ORAL
  Filled 2012-11-19 (×2): qty 1

## 2012-11-19 MED ORDER — SIMVASTATIN 20 MG PO TABS
20.0000 mg | ORAL_TABLET | Freq: Every day | ORAL | Status: DC
  Administered 2012-11-20: 20 mg via ORAL
  Filled 2012-11-19: qty 1

## 2012-11-19 MED ORDER — INSULIN ASPART 100 UNIT/ML ~~LOC~~ SOLN: 0.0000 [IU] | Freq: Every day | SUBCUTANEOUS | Status: DC

## 2012-11-19 NOTE — Telephone Encounter (Signed)
Called and informed caregiver and she said she will take care of it.

## 2012-11-19 NOTE — ED Provider Notes (Signed)
History  This chart was scribed for Geoffery Lyons, MD by Ardeen Jourdain, ED Scribe. This patient was seen in room APA09/APA09 and the patient's care was started at 1446.  CSN: [id number]  Arrival date & time 11/19/12  1434   First MD Initiated Contact with Patient 11/19/12 1446      Chief Complaint  Patient presents with  . GI Bleeding     The history is provided by the patient. No language interpreter was used.    [name] is a 79 y.o. female anemia, DM, atrial fibrillation, CAD and CRI who presents to the Emergency Department complaining of gradual onset, unchanging, constant bloody stools with associated SOB that began 2 days ago. She describes the stool as dark and tarry. She denies any abdominal pain, nausea, emesis and diarrhea as associated symptoms. She states she takes Coumadin because she had a coronary artery bypass graft in March 2009. She states she has been checked for blood in her stool and the results were positive. She states she feels like she is "going to pass out" and that she "can't get her breath right." She reports a h/o similar symptoms. Pt states her blood was checked and had a Hgb around 8.    Past Medical History  Diagnosis Date  . Anemia 2008  . DM (diabetes mellitus) 1999  . Cataract     BIL  . Atrial fibrillation   . CAD (coronary artery disease)   . CRI (chronic renal insufficiency) 2008    Past Surgical History  Procedure Laterality Date  . Esophagogastroduodenoscopy  11/07/2007    HQI:ONGEXB Schatzki's ring.  Otherwise normal esophagus   . Appendectomy      APPENDICITIS  . Coronary artery bypass graft  MAR 2009  . Pacemaker insertion  MAR 2009    Family History  Problem Relation Age of Onset  . Colon cancer Neg Hx   . Colon polyps Neg Hx     History  Substance Use Topics  . Smoking status: Never Smoker   . Smokeless tobacco: Not on file  . Alcohol Use: No   No OB history available.   Review of Systems  Constitutional:  Positive for fatigue. Negative for fever and chills.  Respiratory: Positive for shortness of breath.   Cardiovascular: Negative for chest pain.  Gastrointestinal: Positive for blood in stool. Negative for nausea, vomiting and diarrhea.  Musculoskeletal: Negative for back pain.  Neurological: Negative for weakness.  All other systems reviewed and are negative.    Allergies  Review of patient's allergies indicates no known allergies.  Home Medications   Current Outpatient Rx  Name  Route  Sig  Dispense  Refill  . allopurinol (ZYLOPRIM) 100 MG tablet   Oral   Take 100 mg by mouth daily.         Marland Kitchen amiodarone (PACERONE) 200 MG tablet   Oral   Take 200 mg by mouth daily.          Marland Kitchen aspirin 81 MG tablet   Oral   Take 162 mg by mouth daily.          . citalopram (CELEXA) 10 MG tablet   Oral   Take 10 mg by mouth every morning.          . enalapril (VASOTEC) 10 MG tablet   Oral   Take 10 mg by mouth daily.          . folic acid (FOLVITE) 1 MG tablet   Oral  Take 1 mg by mouth 2 (two) times daily.         . furosemide (LASIX) 40 MG tablet   Oral   Take 80 mg by mouth daily.         Marland Kitchen GLIPIZIDE XL 10 MG 24 hr tablet   Oral   Take 10 mg by mouth daily.          . iron polysaccharides (NIFEREX) 150 MG capsule   Oral   Take 150 mg by mouth 2 (two) times daily.         Marland Kitchen levothyroxine (SYNTHROID, LEVOTHROID) 125 MCG tablet   Oral   Take 125 mcg by mouth daily before breakfast.          . magnesium oxide (MAG-OX) 400 MG tablet   Oral   Take 400 mg by mouth daily.         Marland Kitchen oxybutynin (DITROPAN) 5 MG tablet   Oral   Take 5 mg by mouth 2 (two) times daily.          . pantoprazole (PROTONIX) 40 MG tablet   Oral   Take 40 mg by mouth daily.          . potassium chloride SA (K-DUR,KLOR-CON) 20 MEQ tablet   Oral   Take 20 mEq by mouth daily.          . pravastatin (PRAVACHOL) 40 MG tablet   Oral   Take 40 mg by mouth daily.           . traMADol (ULTRAM) 50 MG tablet   Oral   Take 50 mg by mouth every 4 (four) hours as needed for pain.         Marland Kitchen warfarin (COUMADIN) 5 MG tablet   Oral   Take 5 mg by mouth daily. Takes 5 mg on Tues and Thurs. Takes 2.5 mg on all other days.           Triage Vitals: BP 156/63  Pulse 82  Temp(Src) 97.1 F (36.2 C) (Oral)  Resp 20  Ht 5\' 4"  (1.626 m)  Wt 250 lb (113.399 kg)  BMI 42.89 kg/m2  SpO2 96%  Physical Exam  Nursing note and vitals reviewed. Constitutional: She is oriented to person, place, and time. She appears well-developed and well-nourished. No distress.  HENT:  Head: Normocephalic and atraumatic.  Eyes: EOM are normal. Pupils are equal, round, and reactive to light.  Neck: Normal range of motion. Neck supple. No tracheal deviation present.  Cardiovascular: Normal rate, regular rhythm and normal heart sounds.  Exam reveals no gallop and no friction rub.   No murmur heard. Pulmonary/Chest: Effort normal. No respiratory distress. She has rales.  Slight rales in bases bilaterally   Abdominal: Soft. Bowel sounds are normal. She exhibits no distension and no mass. There is no tenderness. There is no rebound and no guarding.  Genitourinary:  Chaperone present, rectal exam reveals no masses. Greenish black, mildly hempositive stools   Musculoskeletal: Normal range of motion. She exhibits edema.  3+ BLE pitting edema  Neurological: She is alert and oriented to person, place, and time.  Skin: Skin is warm and dry. She is not diaphoretic. There is pallor.  Psychiatric: She has a normal mood and affect. Her behavior is normal.    ED Course  Procedures (including critical care time)  DIAGNOSTIC STUDIES: Oxygen Saturation is 96% on room air, adequate by my interpretation.    COORDINATION OF CARE:  3:03 PM-Discussed treatment plan  with pt at bedside and pt agreed to plan.    Labs Reviewed - No data to display No results found.   No diagnosis found.   Date:  11/19/2012  Rate: 79  Rhythm: normal sinus rhythm  QRS Axis: normal  Intervals: normal  ST/T Wave abnormalities: nonspecific T wave changes  Conduction Disutrbances:RBBB  Narrative Interpretation:   Old EKG Reviewed: unchanged    MDM  The patient presents here with shortness of breath, possible GI bleed.  The workup is more suggestive of a chf exacerbation as her bnp is elevated, edema is greater, and the chest xray shows increased interstitial edema.  There is no evidence for MI as the troponin is negative.  She is hypoxic with oxygen sats in the upper 80's.  I have contacted [facility] for admission.        I personally performed the services described in this documentation, which was scribed in my presence. The recorded information has been reviewed and is accurate.      Geoffery Lyons, MD 11/19/12 [phone]

## 2012-11-19 NOTE — Telephone Encounter (Signed)
I called and spoke to pt and caregiver, Gevena Barre. Pt said she is very weak. She has only had breakfast today, a bowl of cereal. She usually eats lunch and supper, but just doesn't have any appetite. She had a BM this AM that was very dark and a little dark blood. She has a normal BM daily and has not had any nausea, vomiting or diarrhea. The only thing she complains of is being weak. She is scheduled for TCS/EGD on 11/25/2012. She said that Dr. Darrick Penna reviewed some labs when she was in the office and said her hemoglobin was at 7. She just does not feel like doing anything today. Please advise! Sending a pager message to Dr. Darrick Penna also.

## 2012-11-19 NOTE — H&P (Signed)
[facility] History and Physical  [name]  YNW:[id number]  DOB: 1932-10-04   DOA: 11/19/2012   PCP:   Cassell Smiles., MD  Cardiologist: Dr. Alanda Amass at [facility] heart and vascular  Chief Complaint:  Black stool today;   HPI: [name] is an 79 y.o. female.   Elderly obese Caucasian lady, with a history of chronic anemia, paroxysmal atrial fibrillation on chronic Coumadin anti-coagulation, being prepped for colonoscopy within next few days; has been advised by Dr. Darrick Penna that she is to hold her Coumadin as of Wednesday evening, came to the emergency room today because she was having blood in her stool. She also has a long history of congestive heart failure with a remote ejection fraction of 35%, but a 2-D echo 2 months ago which showed EF 50-65% and no  wall motion abnormalities.  In the ED, ER physician noted that her stools are brownish green and although heme positive he did not feel she was having significant GI bleed; he did although note she was very short of breath, and her BNP was markedly elevated; hospitalist service was called to assist.  Patient reports chronic shortness of breath and swelling of her legs on and off for years; but for the last few weeks the swelling has not been going down and the shortness of breath has been getting worse; she chronically sleeps on 2 pillows, has not needed more, and sleeps well throughout the night except when she occasionally wakes up to urinate.  She wears compression hose but notes that they do not seem to be helping the swelling of her legs.  She also has diabetes and chronic kidney disease stage III.  Review of Dr. Darrick Penna' and notes of April 16, show a hemoglobin on March 9 of 10.7, with normal ferritin and  normal B12.    Rewiew of Systems:   All systems negative except as marked bold or noted in the HPI;  Constitutional:    malaise, fever and chills. ;  Eyes:   eye pain, redness and discharge. ;  ENMT:    ear pain, hoarseness, nasal congestion, sinus pressure and sore throat. ;  Cardiovascular:    chest pain, palpitations, diaphoresis, dyspnea and peripheral edema.  Respiratory:   cough, hemoptysis, wheezing and stridor. ;  Gastrointestinal:  nausea, vomiting, diarrhea, constipation, abdominal pain, melena, blood in stool, hematemesis, jaundice and rectal bleeding. unusual weight loss..   Genitourinary:    frequency, dysuria, incontinence,flank pain and hematuria; Musculoskeletal:   back pain and neck pain.  swelling and trauma.;  Skin: .  pruritus, rash, abrasions, bruising and skin lesion.; ulcerations Neuro:    headache, lightheadedness and neck stiffness.  weakness, altered level of consciousness, altered mental status, extremity weakness, burning feet, involuntary movement, seizure and syncope.  Psych:    anxiety, depression, insomnia, tearfulness, panic attacks, hallucinations, paranoia, suicidal or homicidal ideation    Past Medical History  Diagnosis Date  . Anemia 2008  . DM (diabetes mellitus) 1999  . Cataract     BIL  . Atrial fibrillation   . CAD (coronary artery disease)   . CRI (chronic renal insufficiency) 2008    Past Surgical History  Procedure Laterality Date  . Esophagogastroduodenoscopy  11/07/2007    [facility]:QIONGE Schatzki's ring.  Otherwise normal esophagus   . Appendectomy      APPENDICITIS  . Coronary artery bypass graft  MAR 2009  . Pacemaker insertion  MAR 2009    Medications:  HOME MEDS: Prior to Admission  medications   Medication Sig Start Date End Date Taking? Authorizing Provider  allopurinol (ZYLOPRIM) 100 MG tablet Take 100 mg by mouth every morning.    Yes Historical Provider, MD  amiodarone (PACERONE) 200 MG tablet Take 200 mg by mouth every morning.  08/14/12  Yes Historical Provider, MD  aspirin 81 MG tablet Take 162 mg by mouth daily.    Yes Historical Provider, MD  citalopram (CELEXA) 10 MG tablet Take 10 mg by mouth every morning.  08/14/12   Yes Historical Provider, MD  enalapril (VASOTEC) 10 MG tablet Take 10 mg by mouth every morning.  08/14/12  Yes Historical Provider, MD  folic acid (FOLVITE) 1 MG tablet Take 1 mg by mouth 2 (two) times daily.   Yes Historical Provider, MD  furosemide (LASIX) 40 MG tablet Take 80 mg by mouth every morning.    Yes Historical Provider, MD  GLIPIZIDE XL 10 MG 24 hr tablet Take 10 mg by mouth every morning.  08/14/12  Yes Historical Provider, MD  iron polysaccharides (NIFEREX) 150 MG capsule Take 150 mg by mouth 2 (two) times daily.   Yes Historical Provider, MD  levothyroxine (SYNTHROID, LEVOTHROID) 125 MCG tablet Take 125 mcg by mouth every morning.  08/14/12  Yes Historical Provider, MD  magnesium oxide (MAG-OX) 400 MG tablet Take 400 mg by mouth every morning.    Yes Historical Provider, MD  oxybutynin (DITROPAN) 5 MG tablet Take 5 mg by mouth 2 (two) times daily.  08/14/12  Yes Historical Provider, MD  pantoprazole (PROTONIX) 40 MG tablet Take 40 mg by mouth every morning.  10/29/12  Yes Historical Provider, MD  potassium chloride SA (K-DUR,KLOR-CON) 20 MEQ tablet Take 20 mEq by mouth every morning.  08/14/12  Yes Historical Provider, MD  pravastatin (PRAVACHOL) 40 MG tablet Take 40 mg by mouth daily.  08/14/12  Yes Historical Provider, MD  traMADol (ULTRAM) 50 MG tablet Take 50 mg by mouth every 4 (four) hours as needed for pain.   Yes Historical Provider, MD  warfarin (COUMADIN) 5 MG tablet Take 5 mg by mouth daily. Takes 5 mg on Tues and Thurs. Takes 2.5 mg on all other days. 10/02/12  Yes Historical Provider, MD     Allergies:  No Known Allergies  Social History:   reports that she has never smoked. She does not have any smokeless tobacco history on file. She reports that she does not drink alcohol or use illicit drugs.  Family History: Family History  Problem Relation Age of Onset  . Colon cancer Neg Hx   . Colon polyps Neg Hx      Physical Exam: Filed Vitals:   11/19/12 1441 11/19/12  1807 11/19/12 1850  BP: 156/63 166/83 166/85  Pulse: 82 87 89  Temp: 97.1 F (36.2 C)  97.4 F (36.3 C)  TempSrc: Oral    Resp: 20 20 20   Height: 5\' 4"  (1.626 m)  5\' 4"  (1.626 m)  Weight: 113.399 kg (250 lb)  117.6 kg (259 lb 4.2 oz)  SpO2: 96% 94% 97%   Blood pressure 166/85, pulse 89, temperature 97.4 F (36.3 C), temperature source Oral, resp. rate 20, height 5\' 4"  (1.626 m), weight 117.6 kg (259 lb 4.2 oz), SpO2 97.00%.  GEN:  Pleasant obese Caucasian lady lying bed in no acute distress; cooperative with exam PSYCH:  alert and oriented x4;  anxious or depressed; affect is appropriate. HEENT: Mucous membranes pink and anicteric; PERRLA; EOM intact; no cervical lymphadenopathy nor thyromegaly or carotid  bruit; no JVD; thick neck Breasts:: Not examined CHEST WALL: No tenderness CHEST: Normal respiration, clear to auscultation bilaterally HEART: Regular rate and rhythm; distant heart sounds no murmurs rubs or gallops BACK: No kyphosis no scoliosis; no CVA tenderness ABDOMEN: Obese, soft non-tender; no masses, no organomegaly, normal abdominal bowel sounds; Rectal Exam: Not done EXTREMITIES:age-appropriate arthropathy of the hands and knees; 2+ bilateral lower extremity edema with venous stasis changes;  And early breakage of the skin and the left. Genitalia: not examined PULSES: 2+ and symmetric SKIN: Normal hydration no rash or ulceration CNS: Cranial nerves 2-12 grossly intact no focal lateralizing neurologic deficit   Labs on Admission:  Basic Metabolic Panel:  Recent Labs Lab 11/19/12 1504  NA 140  K 3.6  CL 104  CO2 25  GLUCOSE 153*  BUN 25*  CREATININE 1.61*  CALCIUM 9.1   Liver Function Tests:  Recent Labs Lab 11/19/12 1504  AST 17  ALT 17  ALKPHOS 91  BILITOT 0.4  PROT 6.9  ALBUMIN 3.4*   No results found for this basename: LIPASE, AMYLASE,  in the last 168 hours No results found for this basename: AMMONIA,  in the last 168 hours CBC:  Recent  Labs Lab 11/19/12 1504  WBC 5.5  NEUTROABS 3.6  HGB 10.6*  HCT 33.6*  MCV 85.9  PLT 231   Cardiac Enzymes:  Recent Labs Lab 11/19/12 1504  TROPONINI <0.30   BNP: No components found with this basename: POCBNP,  D-dimer: No components found with this basename: D-DIMER,  CBG: No results found for this basename: GLUCAP,  in the last 168 hours  Radiological Exams on Admission: Dg Chest 2 View  11/19/2012  *RADIOLOGY REPORT*  Clinical Data: GI bleeding.  Lower extremity swelling.  CHEST - 2 VIEW  Comparison: Two-view chest 08/06/2012.  Findings: The heart is enlarged.  The diffuse interstitial pattern has increased, suggesting edema.  Mild bibasilar atelectasis is noted.  Pacing wires are stable.  Atherosclerotic calcifications are evident.  The visualized soft tissues and bony thorax are unremarkable.  IMPRESSION:  1.  Stable cardiomegaly with increased interstitial edema, suggesting congestive heart failure. 2.  Atherosclerosis. 3.  Stable pacing wires.   Original Report Authenticated By: Marin Roberts, M.D.     EKG: Independently reviewed. A. Fib, superimposed on ventricular paced rhythm    Assessment/Plan Present on Admission:  . CHF exacerbation  Will give high-dose intravenous Lasix because of her renal failure; potassium supplementation; consider cardiology consult  . Atrial fibrillation  Controlled rate; will hold her warfarin in preparation for colonoscopy procedure; hopefully she will be discharged in time  . Anemia  Stable, no evidence of acute bleeding; will monitor H&H every 8 hours  . possible Chronic lower GI bleeding, as evidenced by Hemoccult positivity  We'll hold her warfarin in preparation for planned colonoscopy; monitor H&H; consider courtesy GI   . Type II or unspecified type diabetes mellitus without mention of complication, not stated as uncontrolled  Continue glipizide and sliding scale insulin  . Chronic kidney disease (CKD), stage III  (moderate)  Continue ACE inhibitor, Lasix; check urinalysis   Additionally patient is starting to have some breakdown of the skin of the legs from a chronic edema; she may benefit from physical therapy forUNNA boots  Other plans as per orders.  Code Status: Full code Family Communication: Discussed with patient at bedside Disposition Plan: Home in a day or 2   Sherril Heyward Nocturnist [facility] Pager [phone]   11/19/2012, 9:05 PM

## 2012-11-19 NOTE — ED Notes (Signed)
Pt presents to er with c/o blood in stools, dark stools, generalized weakness, sob, bilateral lower extremity swelling, pt unable to state when she noticed the dark stools, generalized weakness for the past few days, hx of chronic lower extremity swelling the has gotten worse despite her "fluid pill" being increased recently. Dr. Judd Lien in room prior to RN, see edp assessment for further, rectal exam performed by Dr. Judd Lien, pt tolerated well,

## 2012-11-19 NOTE — ED Notes (Signed)
Pt c/o blood in stools, unable to state when she first noticed it, states that Dr. Alanda Amass checked her Hgb and was 8?, unable to state when, SOB x 2 days

## 2012-11-19 NOTE — Telephone Encounter (Signed)
Pt's family member/caregiver called to say that patient has had black stools with blood, her Hgb was 7 and very weak. She is scheduled for tcs on 4/28 and what do we advise her to do. I told her that I would have a nurse call her back. Please advise. [phone]

## 2012-11-19 NOTE — Telephone Encounter (Signed)
Pt should go to ed

## 2012-11-20 DIAGNOSIS — K922 Gastrointestinal hemorrhage, unspecified: Secondary | ICD-10-CM

## 2012-11-20 DIAGNOSIS — Z7901 Long term (current) use of anticoagulants: Secondary | ICD-10-CM

## 2012-11-20 DIAGNOSIS — E119 Type 2 diabetes mellitus without complications: Secondary | ICD-10-CM

## 2012-11-20 DIAGNOSIS — I509 Heart failure, unspecified: Secondary | ICD-10-CM

## 2012-11-20 LAB — CBC
HCT: 32.9 % — ABNORMAL LOW (ref 36.0–46.0)
RBC: 3.82 MIL/uL — ABNORMAL LOW (ref 3.87–5.11)
RDW: 16 % — ABNORMAL HIGH (ref 11.5–15.5)
WBC: 7 10*3/uL (ref 4.0–10.5)

## 2012-11-20 LAB — BASIC METABOLIC PANEL
CO2: 27 mEq/L (ref 19–32)
Chloride: 103 mEq/L (ref 96–112)
GFR calc non Af Amer: 29 mL/min — ABNORMAL LOW (ref >90)
Glucose, Bld: 104 mg/dL — ABNORMAL HIGH (ref 70–99)
Potassium: 3.6 mEq/L (ref 3.5–5.1)
Sodium: 139 mEq/L (ref 135–145)

## 2012-11-20 LAB — GLUCOSE, CAPILLARY: Glucose-Capillary: 126 mg/dL — ABNORMAL HIGH (ref 70–99)

## 2012-11-20 LAB — URINALYSIS, ROUTINE W REFLEX MICROSCOPIC
Bilirubin Urine: NEGATIVE
Glucose, UA: NEGATIVE mg/dL
Ketones, ur: NEGATIVE mg/dL
pH: 6.5 (ref 5.0–8.0)

## 2012-11-20 LAB — URINE MICROSCOPIC-ADD ON

## 2012-11-20 LAB — HEMOGLOBIN A1C: Mean Plasma Glucose: 140 mg/dL — ABNORMAL HIGH (ref <117)

## 2012-11-20 NOTE — Progress Notes (Signed)
Chart reviewed.  Subjective: Feels a little better today. Not yet back to baseline. Swelling improved, but still fairly severe. Reports that she has gained about 40 pounds over the past 6 months. Reports that her Lasix was increased recently, but not entirely clear about the specifics of dosing or frequency. Does not weigh herself daily. Initially reports that she eats a low-salt diet, but when asked about specifics, she listed her daily given and hot dogs, etc. No chest pain.  Objective: Vital signs in last 24 hours: Filed Vitals:   11/19/12 1850 11/19/12 2152 11/20/12 0240 11/20/12 0631  BP: 166/85 149/58 140/58 169/89  Pulse: 89 75 75 79  Temp: 97.4 F (36.3 C) 97.8 F (36.6 C) 97.8 F (36.6 C) 97.4 F (36.3 C)  TempSrc:  Oral Oral Oral  Resp: 20 20 20 20   Height: 5\' 4"  (1.626 m)     Weight: 117.6 kg (259 lb 4.2 oz)   116.166 kg (256 lb 1.6 oz)  SpO2: 97% 93% 95% 95%   Weight change:   Intake/Output Summary (Last 24 hours) at 11/20/12 1043 Last data filed at 11/20/12 0900  Gross per 24 hour  Intake      0 ml  Output   2500 ml  Net  -2500 ml   General: Comfortable on nasal cannula oxygen. Neck: JVD present Lungs: Occasional rales at the bases, mild. Breathing nonlabored. No rhonchi or wheezes Cardiovascular regular rate rhythm without murmurs gallops rubs Abdomen: Obese Extremities: 2+ to 3+ edema.  No weeping.  Scaling of skin on feet. No cellulitis  Lab Results: Basic Metabolic Panel:  Recent Labs Lab 11/19/12 1504 11/20/12 0513  NA 140 139  K 3.6 3.6  CL 104 103  CO2 25 27  GLUCOSE 153* 104*  BUN 25* 22  CREATININE 1.61* 1.64*  CALCIUM 9.1 9.0  MG  --  2.1   Liver Function Tests:  Recent Labs Lab 11/19/12 1504  AST 17  ALT 17  ALKPHOS 91  BILITOT 0.4  PROT 6.9  ALBUMIN 3.4*   No results found for this basename: LIPASE, AMYLASE,  in the last 168 hours No results found for this basename: AMMONIA,  in the last 168 hours CBC:  Recent Labs Lab  11/19/12 1504 11/19/12 2105 11/20/12 0513  WBC 5.5  --  7.0  NEUTROABS 3.6  --   --   HGB 10.6* 10.2* 10.3*  HCT 33.6* 32.2* 32.9*  MCV 85.9  --  86.1  PLT 231  --  226   Cardiac Enzymes:  Recent Labs Lab 11/19/12 1504  TROPONINI <0.30   BNP:  Recent Labs Lab 11/19/12 1504  PROBNP 2466.0*   D-Dimer: No results found for this basename: DDIMER,  in the last 168 hours CBG:  Recent Labs Lab 11/19/12 2150 11/20/12 0716  GLUCAP 118* 94   Hemoglobin A1C: No results found for this basename: HGBA1C,  in the last 168 hours Fasting Lipid Panel: No results found for this basename: CHOL, HDL, LDLCALC, TRIG, CHOLHDL, LDLDIRECT,  in the last 168 hours Thyroid Function Tests: No results found for this basename: TSH, T4TOTAL, FREET4, T3FREE, THYROIDAB,  in the last 168 hours Coagulation:  Recent Labs Lab 11/19/12 2105  LABPROT 29.1*  INR 2.94*  Urinalysis:  Recent Labs Lab 11/20/12 0001  COLORURINE YELLOW  LABSPEC 1.015  PHURINE 6.5  GLUCOSEU NEGATIVE  HGBUR NEGATIVE  BILIRUBINUR NEGATIVE  KETONESUR NEGATIVE  PROTEINUR NEGATIVE  UROBILINOGEN 0.2  NITRITE NEGATIVE  LEUKOCYTESUR TRACE*  Studies/Results: Dg  Chest 2 View  11/19/2012  *RADIOLOGY REPORT*  Clinical Data: GI bleeding.  Lower extremity swelling.  CHEST - 2 VIEW  Comparison: Two-view chest 08/06/2012.  Findings: The heart is enlarged.  The diffuse interstitial pattern has increased, suggesting edema.  Mild bibasilar atelectasis is noted.  Pacing wires are stable.  Atherosclerotic calcifications are evident.  The visualized soft tissues and bony thorax are unremarkable.  IMPRESSION:  1.  Stable cardiomegaly with increased interstitial edema, suggesting congestive heart failure. 2.  Atherosclerosis. 3.  Stable pacing wires.   Original Report Authenticated By: Marin Roberts, M.D.    Scheduled Meds: . allopurinol  100 mg Oral q morning - 10a  . amiodarone  200 mg Oral q morning - 10a  . aspirin  162 mg  Oral Daily  . citalopram  10 mg Oral Daily  . enalapril  10 mg Oral q morning - 10a  . furosemide  80 mg Intravenous BID  . glipiZIDE  10 mg Oral QAC breakfast  . insulin aspart  0-5 Units Subcutaneous QHS  . insulin aspart  0-9 Units Subcutaneous TID WC  . iron polysaccharides  150 mg Oral BID  . levothyroxine  125 mcg Oral QAC breakfast  . magnesium oxide  400 mg Oral q morning - 10a  . pantoprazole  40 mg Oral q morning - 10a  . potassium chloride  40 mEq Oral BID  . simvastatin  20 mg Oral q1800  . sodium chloride  3 mL Intravenous Q12H   Continuous Infusions:  PRN Meds:.acetaminophen, ondansetron (ZOFRAN) IV, sodium chloride, traMADol Assessment/Plan: Principal Problem:   CHF exacerbation: Need records from [facility] heart [facility] vascular. Continue Lasix. Needs dietitian counseling for a low salt diet. NeedsCHF teaching.    Anemia: For colonoscopy on Monday if stable. No gross bleeding   Atrial fibrillation: Coumadin on hold for colonoscopy next   Long term (current) use of anticoagulants   Chronic lower GI bleeding   Type II or unspecified type diabetes mellitus without mention of complication, not stated as uncontrolled, controlled   Chronic kidney disease (CKD), stage III (moderate) Obesity   LOS: 1 day   [name] L 11/20/2012, 10:43 AM

## 2012-11-20 NOTE — Progress Notes (Signed)
UR chart review completed.  

## 2012-11-20 NOTE — Plan of Care (Signed)
Problem: Food- and Nutrition-Related Knowledge Deficit (NB-1.1) Goal: Nutrition education Formal process to instruct or train a patient/client in a skill or to impart knowledge to help patients/clients voluntarily manage or modify food choices and eating behavior to maintain or improve health. Outcome: Adequate for Discharge Nutrition Education Note  RD consulted for nutrition education regarding new onset CHF.  RD provided "Low Sodium Nutrition Therapy" handout from the Academy of [employer] and [employer]etics. Reviewed patient's dietary recall. Provided examples on ways to decrease sodium intake in diet. Discouraged intake of processed foods and use of salt shaker. Encouraged fresh fruits and vegetables as well as whole grain sources of carbohydrates to maximize fiber intake.   RD discussed why it is important for patient to adhere to diet recommendations, and emphasized the role of fluids, foods to avoid, and importance of weighing self daily. Teach back method used.  Expect fair to good compliance.  Body mass index is 43.94 kg/(m^2). Pt meets criteria for extreme obesity, class III based on current BMI.  Current diet order is carb modified, patient is consuming approximately 75% of meals at this time. Labs and medications reviewed. No further nutrition interventions warranted at this time. RD contact information provided. If additional nutrition issues arise, please re-consult RD.   Melody Haver, RD, LDN Pager: [phone]

## 2012-11-20 NOTE — Care Management Note (Signed)
    Page 1 of 2   11/21/2012     2:50:57 PM   CARE MANAGEMENT NOTE 11/21/2012  Patient:  [name], [name]   Account Number:  [id number]  Date Initiated:  11/20/2012  Documentation initiated by:  Sharrie Rothman  Subjective/Objective Assessment:   Pt admitted from home with CHF. Pt lives with her daughter who is mentally retarded. Pt is fairly indpendent with ADL's. Does use a walker outside. Is agreeable to [facility] at discharge. Pt would like rollator at discharge.     Action/Plan:   CM will continue to follow for [facility] and DME needs.   Anticipated DC Date:  11/23/2012   Anticipated DC Plan:  HOME W HOME HEALTH SERVICES      DC Planning Services  CM consult      PAC Choice  DURABLE MEDICAL EQUIPMENT  HOME HEALTH   Choice offered to / List presented to:  C-1 Patient   DME arranged  Levan Hurst      DME agency  [facility]     [place] arranged  HH-1 RN      [facility] agency  [facility]   Status of service:  Completed, signed off Medicare Important Message given?  NA - LOS <3 / Initial given by admissions (If response is "NO", the following Medicare IM given date fields will be blank) Date Medicare IM given:   Date Additional Medicare IM given:    Discharge Disposition:  HOME W HOME HEALTH SERVICES  Per UR Regulation:    If discussed at Long Length of Stay Meetings, dates discussed:    Comments:  11/21/12 1450 Arlyss Queen, RN BSN CM Pt discharged home today with [facility] RN. Alroy Bailiff of [facility] is aware and will collect the pts information from the chart. Tula Nakayama of [facility] is aware of pts referral for rolling walker. Pt wants walker dropped shipped to her home. HH services to start within 48 hours. Pt and pts nurse aware of discharge arrangements.  11/20/12 1415 Arlyss Queen, RN BSN CM

## 2012-11-21 LAB — BASIC METABOLIC PANEL
GFR calc Af Amer: 30 mL/min — ABNORMAL LOW (ref >90)
GFR calc non Af Amer: 26 mL/min — ABNORMAL LOW (ref >90)
Potassium: 3.9 mEq/L (ref 3.5–5.1)
Sodium: 141 mEq/L (ref 135–145)

## 2012-11-21 MED ORDER — FUROSEMIDE 80 MG PO TABS: 80.0000 mg | ORAL_TABLET | Freq: Two times a day (BID) | ORAL | Status: DC

## 2012-11-21 MED ORDER — POTASSIUM CHLORIDE CRYS ER 20 MEQ PO TBCR: 20.0000 meq | EXTENDED_RELEASE_TABLET | Freq: Two times a day (BID) | ORAL | Status: DC

## 2012-11-21 NOTE — Progress Notes (Signed)
Patient with orders to be discharge home. Discharge instructions given, patient verbalized understanding. Patient in stable condition. Patient left with son in private vehicle.

## 2012-11-21 NOTE — Progress Notes (Signed)
Patient has not VTE in place. Dr. Lendell Caprice notified. New orders for early ambulation.

## 2012-11-21 NOTE — Discharge Summary (Signed)
Physician Discharge Summary  Patient ID: [name] MRN: [id number] DOB/AGE: January 18, 1933 79 y.o.  Admit date: 11/19/2012 Discharge date: 11/21/2012  Discharge Diagnoses:  Principal Problem:   CHF exacerbation, diastolic with last ejection fraction measured 50-65%. Active Problems:   Anemia   Atrial fibrillation   Long term (current) use of anticoagulants   Chronic lower GI bleeding   Type II or unspecified type diabetes mellitus without mention of complication, not stated as uncontrolled   Chronic kidney disease (CKD), stage III (moderate)     Medication List    TAKE these medications       allopurinol 100 MG tablet  Commonly known as:  ZYLOPRIM  Take 100 mg by mouth every morning.     amiodarone 200 MG tablet  Commonly known as:  PACERONE  Take 200 mg by mouth every morning.     aspirin 81 MG tablet  Take 162 mg by mouth daily.     citalopram 10 MG tablet  Commonly known as:  CELEXA  Take 10 mg by mouth every morning.     enalapril 10 MG tablet  Commonly known as:  VASOTEC  Take 10 mg by mouth every morning.     folic acid 1 MG tablet  Commonly known as:  FOLVITE  Take 1 mg by mouth 2 (two) times daily.     furosemide 80 MG tablet  Commonly known as:  LASIX  Take 1 tablet (80 mg total) by mouth 2 (two) times daily.     GLIPIZIDE XL 10 MG 24 hr tablet  Generic drug:  glipiZIDE  Take 10 mg by mouth every morning.     iron polysaccharides 150 MG capsule  Commonly known as:  NIFEREX  Take 150 mg by mouth 2 (two) times daily.     levothyroxine 125 MCG tablet  Commonly known as:  SYNTHROID, LEVOTHROID  Take 125 mcg by mouth every morning.     magnesium oxide 400 MG tablet  Commonly known as:  MAG-OX  Take 400 mg by mouth every morning.     oxybutynin 5 MG tablet  Commonly known as:  DITROPAN  Take 5 mg by mouth 2 (two) times daily.     pantoprazole 40 MG tablet  Commonly known as:  PROTONIX  Take 40 mg by mouth every morning.     potassium  chloride SA 20 MEQ tablet  Commonly known as:  K-DUR,KLOR-CON  Take 1 tablet (20 mEq total) by mouth 2 (two) times daily.     pravastatin 40 MG tablet  Commonly known as:  PRAVACHOL  Take 40 mg by mouth daily.     traMADol 50 MG tablet  Commonly known as:  ULTRAM  Take 50 mg by mouth every 4 (four) hours as needed for pain.                     Discharge Orders   Future Orders Complete By Expires     Activity as tolerated - No restrictions  As directed     Diet - low sodium heart healthy  As directed     Diet Carb Modified  As directed     Discharge instructions  As directed     Comments:      Low salt diet.  Weigh yourself daily.  Call Dr. Alanda Amass if your weight increased by 5 pounds in 7 days       Follow-up Information   Follow up with [facility], Pearletha Furl, MD In  2 weeks.   Contact information:   [address] [place] [address] [phone]       Disposition:  home  Discharged Condition: Stable  Consults:   none  Labs:   Results for orders placed during the hospital encounter of 11/19/12 (from the past 48 hour(s))  CBC WITH DIFFERENTIAL     Status: Abnormal   Collection Time    11/19/12  3:04 PM      Result Value Range   WBC 5.5  4.0 - 10.5 K/uL   RBC 3.91  3.87 - 5.11 MIL/uL   Hemoglobin 10.6 (*) 12.0 - 15.0 g/dL   HCT 29.5 (*) 28.4 - 13.2 %   MCV 85.9  78.0 - 100.0 fL   MCH 27.1  26.0 - 34.0 pg   MCHC 31.5  30.0 - 36.0 g/dL   RDW 44.0 (*) 10.2 - 72.5 %   Platelets 231  150 - 400 K/uL   Neutrophils Relative 66  43 - 77 %   Neutro Abs 3.6  1.7 - 7.7 K/uL   Lymphocytes Relative 22  12 - 46 %   Lymphs Abs 1.2  0.7 - 4.0 K/uL   Monocytes Relative 8  3 - 12 %   Monocytes Absolute 0.4  0.1 - 1.0 K/uL   Eosinophils Relative 3  0 - 5 %   Eosinophils Absolute 0.2  0.0 - 0.7 K/uL   Basophils Relative 1  0 - 1 %   Basophils Absolute 0.0  0.0 - 0.1 K/uL  COMPREHENSIVE METABOLIC PANEL     Status: Abnormal   Collection Time     11/19/12  3:04 PM      Result Value Range   Sodium 140  135 - 145 mEq/L   Potassium 3.6  3.5 - 5.1 mEq/L   Chloride 104  96 - 112 mEq/L   CO2 25  19 - 32 mEq/L   Glucose, Bld 153 (*) 70 - 99 mg/dL   BUN 25 (*) 6 - 23 mg/dL   Creatinine, Ser 3.66 (*) 0.50 - 1.10 mg/dL   Calcium 9.1  8.4 - 44.0 mg/dL   Total Protein 6.9  6.0 - 8.3 g/dL   Albumin 3.4 (*) 3.5 - 5.2 g/dL   AST 17  0 - 37 U/L   ALT 17  0 - 35 U/L   Alkaline Phosphatase 91  39 - 117 U/L   Total Bilirubin 0.4  0.3 - 1.2 mg/dL   GFR calc non Af Amer 30 (*) >90 mL/min   GFR calc Af Amer 34 (*) >90 mL/min   Comment:            The eGFR has been calculated     using the CKD EPI equation.     This calculation has not been     validated in all clinical     situations.     eGFR's persistently     <90 mL/min signify     possible Chronic Kidney Disease.  PRO B NATRIURETIC PEPTIDE     Status: Abnormal   Collection Time    11/19/12  3:04 PM      Result Value Range   Pro B Natriuretic peptide (BNP) 2466.0 (*) 0 - 450 pg/mL  TROPONIN I     Status: None   Collection Time    11/19/12  3:04 PM      Result Value Range   Troponin I <0.30  <0.30 ng/mL   Comment:  Due to the release kinetics of cTnI,     a negative result within the first hours     of the onset of symptoms does not rule out     myocardial infarction with certainty.     If myocardial infarction is still suspected,     repeat the test at appropriate intervals.  TYPE AND SCREEN     Status: None   Collection Time    11/19/12  3:05 PM      Result Value Range   ABO/RH(D) O NEG     Antibody Screen NEG     Sample Expiration 11/22/2012    OCCULT BLOOD, POC DEVICE     Status: Abnormal   Collection Time    11/19/12  3:12 PM      Result Value Range   Fecal Occult Bld POSITIVE (*) NEGATIVE  TSH     Status: None   Collection Time    11/19/12  9:05 PM      Result Value Range   TSH 0.561  0.350 - 4.500 uIU/mL  HEMOGLOBIN A1C     Status: Abnormal    Collection Time    11/19/12  9:05 PM      Result Value Range   Hemoglobin A1C 6.5 (*) <5.7 %   Comment: (NOTE)                                                                               According to the ADA Clinical Practice Recommendations for 2011, when     HbA1c is used as a screening test:      >=6.5%   Diagnostic of Diabetes Mellitus               (if abnormal result is confirmed)     5.7-6.4%   Increased risk of developing Diabetes Mellitus     References:Diagnosis and Classification of Diabetes Mellitus,Diabetes     Care,2011,34(Suppl 1):S62-S69 and Standards of Medical Care in             Diabetes - 2011,Diabetes Care,2011,34 (Suppl 1):S11-S61.   Mean Plasma Glucose 140 (*) <117 mg/dL  HEMOGLOBIN AND HEMATOCRIT, BLOOD     Status: Abnormal   Collection Time    11/19/12  9:05 PM      Result Value Range   Hemoglobin 10.2 (*) 12.0 - 15.0 g/dL   HCT 16.1 (*) 09.6 - 04.5 %  APTT     Status: Abnormal   Collection Time    11/19/12  9:05 PM      Result Value Range   aPTT 44 (*) 24 - 37 seconds   Comment:            IF BASELINE aPTT IS ELEVATED,     SUGGEST PATIENT RISK ASSESSMENT     BE USED TO DETERMINE APPROPRIATE     ANTICOAGULANT THERAPY.  PROTIME-INR     Status: Abnormal   Collection Time    11/19/12  9:05 PM      Result Value Range   Prothrombin Time 29.1 (*) 11.6 - 15.2 seconds   INR 2.94 (*) 0.00 - 1.49  GLUCOSE, CAPILLARY     Status: Abnormal   Collection Time  11/19/12  9:50 PM      Result Value Range   Glucose-Capillary 118 (*) 70 - 99 mg/dL  URINALYSIS, ROUTINE W REFLEX MICROSCOPIC     Status: Abnormal   Collection Time    11/20/12 12:01 AM      Result Value Range   Color, Urine YELLOW  YELLOW   APPearance CLEAR  CLEAR   Specific Gravity, Urine 1.015  1.005 - 1.030   pH 6.5  5.0 - 8.0   Glucose, UA NEGATIVE  NEGATIVE mg/dL   Hgb urine dipstick NEGATIVE  NEGATIVE   Bilirubin Urine NEGATIVE  NEGATIVE   Ketones, ur NEGATIVE  NEGATIVE mg/dL   Protein,  ur NEGATIVE  NEGATIVE mg/dL   Urobilinogen, UA 0.2  0.0 - 1.0 mg/dL   Nitrite NEGATIVE  NEGATIVE   Leukocytes, UA TRACE (*) NEGATIVE  URINE MICROSCOPIC-ADD ON     Status: None   Collection Time    11/20/12 12:01 AM      Result Value Range   WBC, UA 0-2  <3 WBC/hpf   Comment: 0-2     0-2  MAGNESIUM     Status: None   Collection Time    11/20/12  5:13 AM      Result Value Range   Magnesium 2.1  1.5 - 2.5 mg/dL  BASIC METABOLIC PANEL     Status: Abnormal   Collection Time    11/20/12  5:13 AM      Result Value Range   Sodium 139  135 - 145 mEq/L   Potassium 3.6  3.5 - 5.1 mEq/L   Chloride 103  96 - 112 mEq/L   CO2 27  19 - 32 mEq/L   Glucose, Bld 104 (*) 70 - 99 mg/dL   BUN 22  6 - 23 mg/dL   Creatinine, Ser 5.40 (*) 0.50 - 1.10 mg/dL   Calcium 9.0  8.4 - 98.1 mg/dL   GFR calc non Af Amer 29 (*) >90 mL/min   GFR calc Af Amer 34 (*) >90 mL/min   Comment:            The eGFR has been calculated     using the CKD EPI equation.     This calculation has not been     validated in all clinical     situations.     eGFR's persistently     <90 mL/min signify     possible Chronic Kidney Disease.  CBC     Status: Abnormal   Collection Time    11/20/12  5:13 AM      Result Value Range   WBC 7.0  4.0 - 10.5 K/uL   RBC 3.82 (*) 3.87 - 5.11 MIL/uL   Hemoglobin 10.3 (*) 12.0 - 15.0 g/dL   HCT 19.1 (*) 47.8 - 29.5 %   MCV 86.1  78.0 - 100.0 fL   MCH 27.0  26.0 - 34.0 pg   MCHC 31.3  30.0 - 36.0 g/dL   RDW 62.1 (*) 30.8 - 65.7 %   Platelets 226  150 - 400 K/uL  GLUCOSE, CAPILLARY     Status: None   Collection Time    11/20/12  7:16 AM      Result Value Range   Glucose-Capillary 94  70 - 99 mg/dL  GLUCOSE, CAPILLARY     Status: Abnormal   Collection Time    11/20/12 11:28 AM      Result Value Range   Glucose-Capillary 140 (*) 70 -  99 mg/dL   Comment 1 Notify RN    GLUCOSE, CAPILLARY     Status: Abnormal   Collection Time    11/20/12  4:23 PM      Result Value Range    Glucose-Capillary 126 (*) 70 - 99 mg/dL   Comment 1 Notify RN    GLUCOSE, CAPILLARY     Status: Abnormal   Collection Time    11/20/12  5:21 PM      Result Value Range   Glucose-Capillary 124 (*) 70 - 99 mg/dL   Comment 1 Documented in Chart     Comment 2 Notify RN    GLUCOSE, CAPILLARY     Status: Abnormal   Collection Time    11/20/12 10:17 PM      Result Value Range   Glucose-Capillary 149 (*) 70 - 99 mg/dL  BASIC METABOLIC PANEL     Status: Abnormal   Collection Time    11/21/12  5:41 AM      Result Value Range   Sodium 141  135 - 145 mEq/L   Potassium 3.9  3.5 - 5.1 mEq/L   Chloride 103  96 - 112 mEq/L   CO2 28  19 - 32 mEq/L   Glucose, Bld 118 (*) 70 - 99 mg/dL   BUN 27 (*) 6 - 23 mg/dL   Creatinine, Ser 1.61 (*) 0.50 - 1.10 mg/dL   Calcium 9.1  8.4 - 09.6 mg/dL   GFR calc non Af Amer 26 (*) >90 mL/min   GFR calc Af Amer 30 (*) >90 mL/min   Comment:            The eGFR has been calculated     using the CKD EPI equation.     This calculation has not been     validated in all clinical     situations.     eGFR's persistently     <90 mL/min signify     possible Chronic Kidney Disease.  GLUCOSE, CAPILLARY     Status: Abnormal   Collection Time    11/21/12  7:14 AM      Result Value Range   Glucose-Capillary 100 (*) 70 - 99 mg/dL   Comment 1 Notify RN    GLUCOSE, CAPILLARY     Status: Abnormal   Collection Time    11/21/12 12:07 PM      Result Value Range   Glucose-Capillary 121 (*) 70 - 99 mg/dL   Comment 1 Notify RN      Diagnostics:  Dg Chest 2 View  11/19/2012  *RADIOLOGY REPORT*  Clinical Data: GI bleeding.  Lower extremity swelling.  CHEST - 2 VIEW  Comparison: Two-view chest 08/06/2012.  Findings: The heart is enlarged.  The diffuse interstitial pattern has increased, suggesting edema.  Mild bibasilar atelectasis is noted.  Pacing wires are stable.  Atherosclerotic calcifications are evident.  The visualized soft tissues and bony thorax are unremarkable.   IMPRESSION:  1.  Stable cardiomegaly with increased interstitial edema, suggesting congestive heart failure. 2.  Atherosclerosis. 3.  Stable pacing wires.   Original Report Authenticated By: Marin Roberts, M.D.    EKG: Atrial fibrillation with frequent ventricular-paced complexes and with premature ventricular or aberrantly conducted complexes Nonspecific ST and T wave abnormality  Full Code   Hospital Course: See H&P for complete admission details. The patient is a 79 year old white female who presented with blood in her stool, shortness of breath and leg edema. She had held her Coumadin for  her upcoming colonoscopy. Rectal exam revealed heme positive brown stool. She did have CHF based on chest x-ray and leg edema. She was admitted to telemetry, and given IV diuretics. She had an echocardiogram in February so this was not repeated. Records were requested from [facility] but not yet faxed by the time of discharge. By the time of discharge, her breathing was improved. Leg edema improved. She is scheduled for outpatient colonoscopy with Dr. fields in a few days. She had no gross bleeding while here and her hemoglobin and blood pressure remained stable. Total time on the day of discharge greater than 30 minutes.  Discharge Exam:  Blood pressure 157/79, pulse 81, temperature 98 F (36.7 C), temperature source Oral, resp. rate 18, height 5\' 4"  (1.626 m), weight 115 kg (253 lb 8.5 oz), SpO2 95.00%.  General: Comfortable with nonlabored breathing Lungs clear to auscultation bilaterally without wheezes rhonchi or rales Cardiovascular regular rate rhythm without murmurs gallops rubs Extremities: 2+ pitting edema  Signed: Joselinne Lawal L 11/21/2012, 1:10 PM

## 2012-11-21 NOTE — Progress Notes (Signed)
Patient ambulated in hall with no oxygen O2 sats 95%, no SOB observed.

## 2012-11-25 ENCOUNTER — Encounter (HOSPITAL_COMMUNITY)
Admission: RE | Disposition: A | Discharge status: Home / Self Care | Payer: Self-pay | Source: Ambulatory Visit | Attending: Gastroenterology

## 2012-11-25 ENCOUNTER — Other Ambulatory Visit: Payer: Self-pay | Admitting: General Practice

## 2012-11-25 ENCOUNTER — Ambulatory Visit (HOSPITAL_COMMUNITY)
Admission: RE | Admit: 2012-11-25 | Discharge: 2012-11-25 | Disposition: A | Discharge status: Home / Self Care | Payer: Medicare Other | Source: Ambulatory Visit | Attending: Gastroenterology | Admitting: Gastroenterology

## 2012-11-25 ENCOUNTER — Encounter (HOSPITAL_COMMUNITY): Payer: Self-pay | Admitting: *Deleted

## 2012-11-25 ENCOUNTER — Telehealth: Payer: Self-pay

## 2012-11-25 DIAGNOSIS — K648 Other hemorrhoids: Secondary | ICD-10-CM

## 2012-11-25 DIAGNOSIS — K2970 Gastritis, unspecified, without bleeding: Secondary | ICD-10-CM

## 2012-11-25 DIAGNOSIS — K2980 Duodenitis without bleeding: Secondary | ICD-10-CM | POA: Insufficient documentation

## 2012-11-25 DIAGNOSIS — D126 Benign neoplasm of colon, unspecified: Secondary | ICD-10-CM

## 2012-11-25 DIAGNOSIS — K5900 Constipation, unspecified: Secondary | ICD-10-CM | POA: Insufficient documentation

## 2012-11-25 DIAGNOSIS — D649 Anemia, unspecified: Secondary | ICD-10-CM

## 2012-11-25 DIAGNOSIS — Z01812 Encounter for preprocedural laboratory examination: Secondary | ICD-10-CM | POA: Insufficient documentation

## 2012-11-25 DIAGNOSIS — K449 Diaphragmatic hernia without obstruction or gangrene: Secondary | ICD-10-CM | POA: Insufficient documentation

## 2012-11-25 DIAGNOSIS — E119 Type 2 diabetes mellitus without complications: Secondary | ICD-10-CM | POA: Insufficient documentation

## 2012-11-25 DIAGNOSIS — K6389 Other specified diseases of intestine: Secondary | ICD-10-CM | POA: Insufficient documentation

## 2012-11-25 DIAGNOSIS — D509 Iron deficiency anemia, unspecified: Secondary | ICD-10-CM | POA: Insufficient documentation

## 2012-11-25 DIAGNOSIS — K2990 Gastroduodenitis, unspecified, without bleeding: Secondary | ICD-10-CM

## 2012-11-25 DIAGNOSIS — K222 Esophageal obstruction: Secondary | ICD-10-CM | POA: Insufficient documentation

## 2012-11-25 HISTORY — PX: COLONOSCOPY WITH ESOPHAGOGASTRODUODENOSCOPY (EGD): SHX5779

## 2012-11-25 HISTORY — DX: Essential (primary) hypertension: I10

## 2012-11-25 HISTORY — DX: Unspecified osteoarthritis, unspecified site: M19.90

## 2012-11-25 HISTORY — DX: Hyperlipidemia, unspecified: E78.5

## 2012-11-25 HISTORY — DX: Gastro-esophageal reflux disease without esophagitis: K21.9

## 2012-11-25 SURGERY — COLONOSCOPY WITH ESOPHAGOGASTRODUODENOSCOPY (EGD)
Anesthesia: Moderate Sedation

## 2012-11-25 MED ORDER — MIDAZOLAM HCL 5 MG/5ML IJ SOLN
INTRAMUSCULAR | Status: DC | PRN
  Administered 2012-11-25: 1 mg via INTRAVENOUS
  Administered 2012-11-25: 2 mg via INTRAVENOUS
  Administered 2012-11-25 (×2): 1 mg via INTRAVENOUS

## 2012-11-25 MED ORDER — MEPERIDINE HCL 100 MG/ML IJ SOLN
INTRAMUSCULAR | Status: AC
  Filled 2012-11-25: qty 1

## 2012-11-25 MED ORDER — SODIUM CHLORIDE 0.9 % IV SOLN
INTRAVENOUS | Status: DC
  Administered 2012-11-25: 1000 mL via INTRAVENOUS

## 2012-11-25 MED ORDER — STERILE WATER FOR IRRIGATION IR SOLN
Status: DC | PRN
  Administered 2012-11-25: 10:00:00

## 2012-11-25 MED ORDER — MIDAZOLAM HCL 5 MG/5ML IJ SOLN
INTRAMUSCULAR | Status: AC
  Filled 2012-11-25: qty 10

## 2012-11-25 MED ORDER — MEPERIDINE HCL 100 MG/ML IJ SOLN
INTRAMUSCULAR | Status: DC | PRN
  Administered 2012-11-25 (×3): 25 mg via INTRAVENOUS

## 2012-11-25 NOTE — Op Note (Addendum)
[facility] [address] [place], [address]   ENDOSCOPY PROCEDURE REPORT  PATIENT: [name], [name]  MR#: [id number] BIRTHDATE: 03-21-1933 , 79  yrs. old GENDER: Female  ENDOSCOPIST: Jonette Eva, MD REFERRED XB:JYNWG Sherwood Gambler, M.D.  Susa Griffins, M.D.  PROCEDURE DATE: 11/25/2012 PROCEDURE:   EGD w/ biopsy  INDICATIONS:NORMOCYTIC ANEMIA ON COUMADIN. MEDICATIONS: TCS+ Demerol 25 mg IV and Versed 1mg  IV TOPICAL ANESTHETIC:   Cetacaine Spray  DESCRIPTION OF PROCEDURE:     Physical exam was performed.  Informed consent was obtained from the patient after explaining the benefits, risks, and alternatives to the procedure.  The patient was connected to the monitor and placed in the left lateral position.  Continuous oxygen was provided by nasal cannula and IV medicine administered through an indwelling cannula.  After administration of sedation, the patients esophagus was intubated and the [id number] ([id number])  endoscope was advanced under direct visualization to the second portion of the duodenum.  The scope was removed slowly by carefully examining the color, texture, anatomy, and integrity of the mucosa on the way out.  The patient was recovered in endoscopy and discharged home in satisfactory condition.   ESOPHAGUS: A Schatzki ring was found at the gastroesophageal junction and was widely open.   STOMACH: A small hiatal hernia was noted.   Mild non-erosive gastritis (inflammation) was found in the gastric antrum.  Multiple biopsies were performed using cold forceps TO EVALUATE FOR H PYLORI.   DUODENUM: The duodenal mucosa showed no abnormalities in the bulb and second portion of the duodenum.  Cold forcep biopsies were taken in the second portion TO EVALUATE FOR CELIAC SPRUE.  COMPLICATIONS:   None  ENDOSCOPIC IMPRESSION: 1.   Schatzki ring at the gastroesophageal junction 2.   Small hiatal hernia 3.   MILD Non-erosive  gastritis  RECOMMENDATIONS: CAPSULE ENDOSCOPY ON MAY 2.  HOLD YOUR GLIPIZIDE ON THE MORNING OF THE CAPSULE STUDY. CONTINUE PROTONIX. RESTART COUMADIN MAY 2. AVOID ITEMS THAT TRIGGER GASTRITIS. FOLLOW A HIGH FIBER/LOW FAT DIET.  AVOID ITEMS THAT CAUSE BLOATING.  BIOPSY RESULTS WILL BE BACK IN 7 DAYS. OPV IN 4 MOS   REPEAT EXAM:   _______________________________ Rosalie DoctorJonette Eva, MD 11/25/2012 11:29 AM Revised: 11/25/2012 11:29 AM      PATIENT NAME:  [name], [name] MR#: [id number]

## 2012-11-25 NOTE — Telephone Encounter (Signed)
Per Kennyth Arnold in Endo, this pt had TCS/EGD this AM and Dr. Darrick Penna wants her to have [facility] on Fri. Please schedule and fax info to ENDO for pt before she leaves . FAx number [phone].

## 2012-11-25 NOTE — Op Note (Signed)
[facility] [address] [place], [address]   COLONOSCOPY PROCEDURE REPORT  PATIENT: [name], [name]  MR#: [id number] BIRTHDATE: 1933-05-31 , 79  yrs. old GENDER: Female ENDOSCOPIST: Jonette Eva, MD REFERRED XB:JYNWG Sherwood Gambler, M.D.  Susa Griffins, M.D. PROCEDURE DATE:  11/25/2012 PROCEDURE:   Colonoscopy with cold biopsy polypectomy and with random biopsy INDICATIONS:Anemia, non-specific and constipation WHILE TAKING COUMADIN. MEDICATIONS: Demerol 50 mg IV and Versed 4 mg IV  DESCRIPTION OF PROCEDURE:    Physical exam was performed.  Informed consent was obtained from the patient after explaining the benefits, risks, and alternatives to procedure.  The patient was connected to monitor and placed in left lateral position. Continuous oxygen was provided by nasal cannula and IV medicine administered through an indwelling cannula.  After administration of sedation and rectal exam, the patients rectum was intubated and the [id number] ([id number])  colonoscope was advanced under direct visualization to the cecum.  The scope was removed slowly by carefully examining the color, texture, anatomy, and integrity mucosa on the way out.  The patient was recovered in endoscopy and discharged home in satisfactory condition.    COLON FINDINGS: Two sessile polyps measuring 3-4 mm in size were found in the ascending colon.  A polypectomy was performed with cold forceps. The colon was otherwise normal.  There was no diverticulosis, inflammation, or cancers unless previously stated. Small internal hemorrhoids were found.  UNABLE TO INTUBATE ILEUM BECAUSE The colon IS redundant.  The patient was moved on to HER back to reach the cecum.  PREP QUALITY: good. CECAL W/D TIME: 17 minutes  COMPLICATIONS: None  ENDOSCOPIC IMPRESSION: 1.   Two SMALL AC polyps 2.   Small internal hemorrhoids 3.   The colon IS SLIGHTLY redundant  RECOMMENDATIONS: RESTART COUMADIN MAY 2. FOLLOW  A HIGH FIBER/LOW FAT DIET.  AVOID ITEMS THAT CAUSE BLOATING.  BIOPSY RESULTS WILL BE BACK IN 7 DAYS. OPV IN 4 MOS NO SOURCE FOR ANEMIA IDENTIFIED. PROCEED TO EGD.      _______________________________ Rosalie DoctorJonette Eva, MD 11/25/2012 11:23 AM

## 2012-11-25 NOTE — Telephone Encounter (Signed)
Pt scheduled for 11/29/2012@7 :30am  Pt is aware of appt.  Instructions faxed to French Ana, RN in endo.

## 2012-11-25 NOTE — H&P (View-Only) (Signed)
Subjective:    Patient ID: [name], female    DOB: 15-Nov-1932, 79 y.o.   MRN: [id number]  PCP: Sherwood Gambler 1o CARDIOLOGISTAlanda Amass  HPI PT LAST SEEN AND EVALUATED BY [facility] IN 2008-09 FOR ANEMIA WHILE ON COUMADIN FOR AFIB. EGD FEB 2009 SHOWED MILD GASTRITIS. TRIED TO DRINK 4L PREP AND UNABLE TO TOLERATE UDE TO BLOOD SUGARS DROPPING AND NAUSEA/VOMITING. CONCERNED SHE WILL NOT BE ABLE TO TAKE A BOWEL PREP. PT MAY HAVE PROBLEMS WITH CONSTIPATION.  PT DENIES FEVER, CHILLS, BRBPR, nausea, vomiting, melena, diarrhea, abd pain, problems swallowing, problems with sedation, heartburn or indigestion.  Past Medical History  Diagnosis Date  . Anemia 2008  . CRI (chronic renal insufficiency) 2008  . DM (diabetes mellitus) 1999  . Cataract     BIL  . Atrial fibrillation   . CAD (coronary artery disease)     Past Surgical History  Procedure Laterality Date  . Esophagogastroduodenoscopy  11/07/2007    NWG:NFAOZH Schatzki's ring.  Otherwise normal esophagus   . Appendectomy      APPENDICITIS  . Coronary artery bypass graft  MAR 2009  . Pacemaker insertion  MAR 2009    No Known Allergies  Current Outpatient Prescriptions  Medication Sig Dispense Refill  . amiodarone (PACERONE) 200 MG tablet Take 200 mg by mouth daily.       Marland Kitchen aspirin 81 MG tablet Take 81 mg by mouth daily.      . citalopram (CELEXA) 10 MG tablet Take 10 mg by mouth every morning.       . enalapril (VASOTEC) 10 MG tablet Take 10 mg by mouth daily.       . furosemide (LASIX) 20 MG tablet Take 80 mg by mouth daily.       Marland Kitchen GLIPIZIDE XL 10 MG 24 hr tablet Take 10 mg by mouth daily.       Marland Kitchen levothyroxine (SYNTHROID, LEVOTHROID) 125 MCG tablet Take 125 mcg by mouth daily before breakfast.       . oxybutynin (DITROPAN) 5 MG tablet Take 5 mg by mouth 2 (two) times daily.       . pantoprazole (PROTONIX) 40 MG tablet Take 40 mg by mouth daily.       . potassium chloride SA (K-DUR,KLOR-CON) 20 MEQ tablet Take 20 mEq by mouth  daily.       . pravastatin (PRAVACHOL) 40 MG tablet Take 40 mg by mouth daily.       Marland Kitchen warfarin (COUMADIN) 5 MG tablet Take 5 mg by mouth daily. 2.5 mg for 5 days then 5 mg for two days       No current facility-administered medications for this visit.    Family History  Problem Relation Age of Onset  . Colon cancer Neg Hx   . Colon polyps Neg Hx     History  Substance Use Topics  . Smoking status: Never Smoker   . Smokeless tobacco: Not on file  . Alcohol Use: No   Review of Systems LABS REVIEWED FROM 2008 TO 2013: HB 7.7 TO 14.6, CR 1.5-2.2, FERRITIN > 100. JAN 2014: HB 11-11.7, MCV 82.2-83. 2014: HB 10.7 MCV 82.3 , CR 1.78, ALB 3.7 FERRITIN 119 B12 393 TIBC 311  PER HPI OTHERWISE ALL SYSTEMS ARE NEGATIVE.     Objective:   Physical Exam  Vitals reviewed. Constitutional: She is oriented to person, place, and time. She appears well-nourished. No distress.  HENT:  Head: Normocephalic and atraumatic.  Mouth/Throat: Oropharynx is clear and  moist. No oropharyngeal exudate.  Eyes: Pupils are equal, round, and reactive to light. Right eye exhibits no discharge. No scleral icterus.  Neck: Normal range of motion. Neck supple.  Cardiovascular: Normal rate and normal heart sounds.   Pulmonary/Chest: Effort normal and breath sounds normal. No respiratory distress.  Abdominal: Soft. Bowel sounds are normal. She exhibits no distension. There is no tenderness. There is no rebound.  OBESE   Musculoskeletal: She exhibits edema.  LROM. WALKS ASSISTED WITH A CANE.  Lymphadenopathy:    She has no cervical adenopathy.  Neurological: She is alert and oriented to person, place, and time.  NO FOCAL DEFICITS   Psychiatric: She has a normal mood and affect.          Assessment & Plan:

## 2012-11-25 NOTE — Interval H&P Note (Signed)
History and Physical Interval Note:  11/25/2012 9:49 AM  [name]  has presented today for surgery, with the diagnosis of Anemia  The various methods of treatment have been discussed with the patient and family. After consideration of risks, benefits and other options for treatment, the patient has consented to  Procedure(s) with comments: COLONOSCOPY WITH ESOPHAGOGASTRODUODENOSCOPY (EGD) (N/A) - 9:30-moved to [address] to notify pt as a surgical intervention .  The patient's history has been reviewed, patient examined, no change in status, stable for surgery.  I have reviewed the patient's chart and labs.  Questions were answered to the patient's satisfaction.     [employer]

## 2012-11-27 ENCOUNTER — Encounter (HOSPITAL_COMMUNITY): Payer: Self-pay | Admitting: Gastroenterology

## 2012-11-28 ENCOUNTER — Telehealth: Payer: Self-pay | Admitting: Gastroenterology

## 2012-11-28 NOTE — Telephone Encounter (Signed)
[name] called the office this morning to cancel her GIVENS Study scheduled for Friday May 2nd, her daughter fell and broke her arm and she has a lot going on right now and cant do this, she will call back to R/S later

## 2012-11-28 NOTE — Telephone Encounter (Signed)
REVIEWED.  

## 2012-11-29 ENCOUNTER — Encounter (HOSPITAL_COMMUNITY): Admission: RE | Payer: Self-pay | Source: Ambulatory Visit

## 2012-11-29 ENCOUNTER — Ambulatory Visit (HOSPITAL_COMMUNITY): Admission: RE | Admit: 2012-11-29 | Payer: Medicare Other | Source: Ambulatory Visit | Admitting: Gastroenterology

## 2012-11-29 SURGERY — IMAGING PROCEDURE, GI TRACT, INTRALUMINAL, VIA CAPSULE

## 2012-12-03 ENCOUNTER — Telehealth: Payer: Self-pay | Admitting: Gastroenterology

## 2012-12-03 NOTE — Telephone Encounter (Signed)
Please call pt. HER stomach Bx shows mild gastritis AND DUODENITIS. Her colon biopsies are normal.    COMPLETE THE CAPSULE STUDY. CONTINUE PROTONIX. RESTART COUMADIN. FOLLOW UP WITH COUMADIN CLINIC DRINK WATER TO KEEP YOUR URINE LIGHT YELLOW. AVOID ITEMS THAT TRIGGER GASTRITIS.  FOLLOW A HIGH FIBER/LOW FAT DIET. AVOID ITEMS THAT CAUSE BLOATING.  OPV W/ SLF AUG 2014 E30 ANEMIA

## 2012-12-03 NOTE — Telephone Encounter (Signed)
Pt is aware of OV on 8/28 at 0930 with SF in E30 per GW

## 2012-12-03 NOTE — Telephone Encounter (Signed)
Called and informed pt. She said she could not get the capsule study now. She could not afford.

## 2012-12-03 NOTE — Telephone Encounter (Signed)
REVIEWED.  

## 2012-12-03 NOTE — Telephone Encounter (Signed)
Results Cc to PCP  

## 2013-01-07 ENCOUNTER — Encounter: Payer: Self-pay | Admitting: General Practice

## 2013-02-27 ENCOUNTER — Other Ambulatory Visit: Payer: Self-pay | Admitting: Cardiovascular Disease

## 2013-02-27 LAB — CBC WITH DIFFERENTIAL/PLATELET
Basophils Absolute: 0 10*3/uL (ref 0.0–0.1)
Basophils Relative: 1 % (ref 0–1)
HCT: 39 % (ref 36.0–46.0)
Lymphocytes Relative: 25 % (ref 12–46)
MCHC: 31.8 g/dL (ref 30.0–36.0)
Monocytes Absolute: 0.4 10*3/uL (ref 0.1–1.0)
Neutro Abs: 5.3 10*3/uL (ref 1.7–7.7)
Platelets: 203 10*3/uL (ref 150–400)
RDW: 16.2 % — ABNORMAL HIGH (ref 11.5–15.5)
WBC: 8 10*3/uL (ref 4.0–10.5)

## 2013-02-27 LAB — PACEMAKER DEVICE OBSERVATION

## 2013-02-28 LAB — COMPREHENSIVE METABOLIC PANEL
ALT: 19 U/L (ref 0–35)
Alkaline Phosphatase: 91 U/L (ref 39–117)
CO2: 29 mEq/L (ref 19–32)
Creat: 1.93 mg/dL — ABNORMAL HIGH (ref 0.50–1.10)
Glucose, Bld: 143 mg/dL — ABNORMAL HIGH (ref 70–99)
Sodium: 139 mEq/L (ref 135–145)
Total Bilirubin: 0.6 mg/dL (ref 0.3–1.2)

## 2013-02-28 LAB — LIPID PANEL
Cholesterol: 137 mg/dL (ref 0–200)
LDL Cholesterol: 69 mg/dL (ref 0–99)
Total CHOL/HDL Ratio: 3 Ratio
Triglycerides: 113 mg/dL (ref <150)
VLDL: 23 mg/dL (ref 0–40)

## 2013-02-28 LAB — PROTIME-INR: INR: 1.05 (ref <1.50)

## 2013-03-06 ENCOUNTER — Encounter: Payer: Self-pay | Admitting: Cardiovascular Disease

## 2013-03-25 ENCOUNTER — Encounter: Payer: Self-pay | Admitting: Gastroenterology

## 2013-03-26 ENCOUNTER — Telehealth: Payer: Self-pay | Admitting: Gastroenterology

## 2013-03-26 ENCOUNTER — Ambulatory Visit: Payer: Medicare Other | Admitting: Gastroenterology

## 2013-03-26 NOTE — Telephone Encounter (Signed)
REVIEWED.  

## 2013-03-26 NOTE — Telephone Encounter (Signed)
Pt was a no show

## 2013-03-27 ENCOUNTER — Ambulatory Visit: Payer: Medicare Other | Admitting: Gastroenterology

## 2013-04-14 ENCOUNTER — Ambulatory Visit: Payer: Self-pay | Admitting: Pharmacist Clinician (PhC)/ Clinical Pharmacy Specialist

## 2013-04-14 DIAGNOSIS — Z7901 Long term (current) use of anticoagulants: Secondary | ICD-10-CM

## 2013-04-14 DIAGNOSIS — I4891 Unspecified atrial fibrillation: Secondary | ICD-10-CM

## 2013-05-17 ENCOUNTER — Emergency Department (HOSPITAL_COMMUNITY)
Admission: EM | Admit: 2013-05-17 | Discharge: 2013-05-17 | Disposition: A | Discharge status: Home / Self Care | Payer: Medicare Other | Attending: Emergency Medicine | Admitting: Emergency Medicine

## 2013-05-17 ENCOUNTER — Encounter (HOSPITAL_COMMUNITY): Payer: Self-pay | Admitting: Emergency Medicine

## 2013-05-17 DIAGNOSIS — M129 Arthropathy, unspecified: Secondary | ICD-10-CM | POA: Insufficient documentation

## 2013-05-17 DIAGNOSIS — E119 Type 2 diabetes mellitus without complications: Secondary | ICD-10-CM | POA: Insufficient documentation

## 2013-05-17 DIAGNOSIS — Z951 Presence of aortocoronary bypass graft: Secondary | ICD-10-CM | POA: Insufficient documentation

## 2013-05-17 DIAGNOSIS — Z7982 Long term (current) use of aspirin: Secondary | ICD-10-CM | POA: Insufficient documentation

## 2013-05-17 DIAGNOSIS — Z950 Presence of cardiac pacemaker: Secondary | ICD-10-CM | POA: Insufficient documentation

## 2013-05-17 DIAGNOSIS — Z8669 Personal history of other diseases of the nervous system and sense organs: Secondary | ICD-10-CM | POA: Insufficient documentation

## 2013-05-17 DIAGNOSIS — Z9861 Coronary angioplasty status: Secondary | ICD-10-CM | POA: Insufficient documentation

## 2013-05-17 DIAGNOSIS — D649 Anemia, unspecified: Secondary | ICD-10-CM | POA: Insufficient documentation

## 2013-05-17 DIAGNOSIS — L02419 Cutaneous abscess of limb, unspecified: Secondary | ICD-10-CM | POA: Insufficient documentation

## 2013-05-17 DIAGNOSIS — I2510 Atherosclerotic heart disease of native coronary artery without angina pectoris: Secondary | ICD-10-CM | POA: Insufficient documentation

## 2013-05-17 DIAGNOSIS — N189 Chronic kidney disease, unspecified: Secondary | ICD-10-CM | POA: Insufficient documentation

## 2013-05-17 DIAGNOSIS — K219 Gastro-esophageal reflux disease without esophagitis: Secondary | ICD-10-CM | POA: Insufficient documentation

## 2013-05-17 DIAGNOSIS — L03116 Cellulitis of left lower limb: Secondary | ICD-10-CM

## 2013-05-17 DIAGNOSIS — I4891 Unspecified atrial fibrillation: Secondary | ICD-10-CM | POA: Insufficient documentation

## 2013-05-17 DIAGNOSIS — I129 Hypertensive chronic kidney disease with stage 1 through stage 4 chronic kidney disease, or unspecified chronic kidney disease: Secondary | ICD-10-CM | POA: Insufficient documentation

## 2013-05-17 DIAGNOSIS — E785 Hyperlipidemia, unspecified: Secondary | ICD-10-CM | POA: Insufficient documentation

## 2013-05-17 DIAGNOSIS — Z79899 Other long term (current) drug therapy: Secondary | ICD-10-CM | POA: Insufficient documentation

## 2013-05-17 MED ORDER — LIDOCAINE HCL (PF) 1 % IJ SOLN
INTRAMUSCULAR | Status: AC
  Filled 2013-05-17: qty 5

## 2013-05-17 MED ORDER — DOXYCYCLINE HYCLATE 100 MG PO TABS
100.0000 mg | ORAL_TABLET | Freq: Once | ORAL | Status: AC
  Administered 2013-05-17: 100 mg via ORAL
  Filled 2013-05-17: qty 1

## 2013-05-17 MED ORDER — DOXYCYCLINE HYCLATE 100 MG PO CAPS: 100.0000 mg | ORAL_CAPSULE | Freq: Two times a day (BID) | ORAL | Status: DC

## 2013-05-17 NOTE — ED Notes (Signed)
Pt reports left lower leg swelling for 2 weeks, area red and swollen, has been draining, stated this is the 3rd time this has happened to her.  Was going to the wound clinic-- unsure of last visit.

## 2013-05-17 NOTE — ED Provider Notes (Signed)
CSN: [id number]     Arrival date & time 05/17/13  1352 History  This chart was scribed for No att. providers found by Ronal Fear, ED Scribe. This patient was seen in room APAH2/APAH2 and the patient's care was started at 3:31 PM.   Chief Complaint  Patient presents with  . Leg Swelling    The history is provided by the patient. No language interpreter was used.    Pt presents with a gradually worsening draining sore with associated mild pain to her left lower leg onset 2 weeks ago with associated left foot swelling. She hasn't seen any one for the sore or the leg swelling. Pt denies prior injury to the area. Pt states that she removed a scab on the affected leg and it escalated to its current point. Pt denies fever or chills. Pt has a hx DM  Past Medical History  Diagnosis Date  . Anemia 2008  . DM (diabetes mellitus) 1999  . Cataract     BIL  . Atrial fibrillation   . CAD (coronary artery disease)   . CRI (chronic renal insufficiency) 2008  . Hypertension   . Hyperlipemia   . GERD (gastroesophageal reflux disease)   . Arthritis    Past Surgical History  Procedure Laterality Date  . Esophagogastroduodenoscopy  11/07/2007    [id number] Schatzki's ring.  Otherwise normal esophagus   . Appendectomy      APPENDICITIS  . Coronary artery bypass graft  MAR 2009  . Pacemaker insertion  MAR 2009  . Total knee arthroplasty Right     2012  . Eye surgery Left   . Coronary angioplasty    . Back surgery    . Tonsillectomy      age 30  . Insert / replace / remove pacemaker    . Joint replacement Right   . Colonoscopy with esophagogastroduodenoscopy (egd) N/A 11/25/2012    SLF:Two SMALL AC polyps/Small internal hemorrhoids/The colon IS SLIGHTLY redundant-EGD:Schatzki ring at the gastroesophageal junction/Small hiatal hernia/MILD Non-erosive gastritis   Family History  Problem Relation Age of Onset  . Colon cancer Neg Hx   . Colon polyps Neg Hx   . Cancer - Other Mother     hogkins  disease  . Diabetes Other    History  Substance Use Topics  . Smoking status: Never Smoker   . Smokeless tobacco: Not on file  . Alcohol Use: No   OB History   Grav Para Term Preterm Abortions TAB SAB Ect Mult Living                 Review of Systems A complete 10 system review of systems was obtained and all systems are negative except as noted in the HPI and PMH.   Allergies  Review of patient's allergies indicates no known allergies.  Home Medications   Current Outpatient Rx  Name  Route  Sig  Dispense  Refill  . allopurinol (ZYLOPRIM) 100 MG tablet   Oral   Take 100 mg by mouth every morning.          Marland Kitchen amiodarone (PACERONE) 200 MG tablet   Oral   Take 200 mg by mouth every morning.          Marland Kitchen aspirin 81 MG tablet   Oral   Take 162 mg by mouth daily.          . citalopram (CELEXA) 10 MG tablet   Oral   Take 10 mg by mouth every morning.          Marland Kitchen  doxycycline (VIBRAMYCIN) 100 MG capsule   Oral   Take 1 capsule (100 mg total) by mouth 2 (two) times daily.   14 capsule   0   . enalapril (VASOTEC) 10 MG tablet   Oral   Take 10 mg by mouth every morning.          . folic acid (FOLVITE) 1 MG tablet   Oral   Take 1 mg by mouth 2 (two) times daily.         . furosemide (LASIX) 80 MG tablet   Oral   Take 1 tablet (80 mg total) by mouth 2 (two) times daily.   60 tablet   0   . GLIPIZIDE XL 10 MG 24 hr tablet   Oral   Take 10 mg by mouth every morning.          . iron polysaccharides (NIFEREX) 150 MG capsule   Oral   Take 150 mg by mouth 2 (two) times daily.         Marland Kitchen levothyroxine (SYNTHROID, LEVOTHROID) 125 MCG tablet   Oral   Take 125 mcg by mouth every morning.          . magnesium oxide (MAG-OX) 400 MG tablet   Oral   Take 400 mg by mouth every morning.          Marland Kitchen oxybutynin (DITROPAN) 5 MG tablet   Oral   Take 5 mg by mouth 2 (two) times daily.          . pantoprazole (PROTONIX) 40 MG tablet   Oral   Take 40 mg by  mouth every morning.          . potassium chloride SA (K-DUR,KLOR-CON) 20 MEQ tablet   Oral   Take 1 tablet (20 mEq total) by mouth 2 (two) times daily.   60 tablet   0     Dispense as written.   . pravastatin (PRAVACHOL) 40 MG tablet   Oral   Take 40 mg by mouth daily.          . traMADol (ULTRAM) 50 MG tablet   Oral   Take 50 mg by mouth every 4 (four) hours as needed for pain.          BP 146/63  Pulse 83  Temp(Src) 97.2 F (36.2 C) (Oral)  Resp 20  Ht 5\' 4"  (1.626 m)  Wt 251 lb 9 oz (114.108 kg)  BMI 43.16 kg/m2  SpO2 95% Physical Exam  Nursing note and vitals reviewed. Constitutional: She is oriented to person, place, and time. She appears well-developed and well-nourished.  HENT:  Head: Normocephalic.  Eyes: EOM are normal.  Neck: Normal range of motion.  Pulmonary/Chest: Effort normal.  Abdominal: She exhibits no distension.  Musculoskeletal: Normal range of motion.  Left anterior distal tibia with 7cm/6cm cellulitis with erythema and tenderness. Small fluctuance. Small serosanguineous. Normal PT and DP pulse in left foot.  Neurological: She is alert and oriented to person, place, and time.  Psychiatric: She has a normal mood and affect.    ED Course  Procedures (including critical care time) NCISION AND DRAINAGE PROCEDURE NOTE: Patient identification was confirmed and verbal consent was obtained. This procedure was performed by Lyanne Co, MD at 3:39 PM. Site: LLE Sterile procedures observed Needle size: 24 gauge Anesthetic used (type and amt): 5 cc lidocaine 1% without epinephrine  Blade size: 11 Drainage: blood no obvious pux Packing used: none Site anesthetized, incision made over site,  wound drained and explored loculations, rinsed with copious amounts of normal saline, wound packed with sterile gauze, covered with dry, sterile dressing.  Pt tolerated procedure well without complications.  Instructions for care discussed verbally and pt  provided with additional written instructions for homecare and f/u.   DIAGNOSTIC STUDIES:   COORDINATION OF CARE:   3:37 PM- Pt advised of plan for treatment including I&D and antibiotics and pt agrees.   Labs Review Labs Reviewed  GLUCOSE, CAPILLARY - Abnormal; Notable for the following:    Glucose-Capillary 169 (*)    All other components within normal limits   Imaging Review No results found.  EKG Interpretation   None       MDM   1. Cellulitis of left lower extremity    Patient is overall well-appearing.  She appears to have a cellulitis of his left lower extremity.  There was as possible fluctuance on the lateral aspect and therefore incision and drainage was made.  Mainly serosanguineous fluid as well as blood was removed from the leg.  No obvious pus present.  Will treat this more as a cellulitis.  Doxycycline given in the ER.  Home with a prescription for antibiotics for 1 week.  She understands return to the ER for new or worsening symptoms  I personally performed the services described in this documentation, which was scribed in my presence. The recorded information has been reviewed and is accurate.     I  Lyanne Co, MD 05/17/13 [phone]

## 2013-05-18 NOTE — ED Notes (Signed)
Pressure dressing applied to left leg using telfa, gauze, abd pad, kerlix, and coban.

## 2013-05-31 DIAGNOSIS — T148XXA Other injury of unspecified body region, initial encounter: Secondary | ICD-10-CM

## 2013-05-31 HISTORY — DX: Other injury of unspecified body region, initial encounter: T14.8XXA

## 2013-06-05 ENCOUNTER — Inpatient Hospital Stay (HOSPITAL_COMMUNITY): Payer: Medicare Other

## 2013-06-05 ENCOUNTER — Encounter (HOSPITAL_COMMUNITY): Payer: Self-pay | Admitting: Emergency Medicine

## 2013-06-05 ENCOUNTER — Telehealth: Payer: Self-pay | Admitting: Internal Medicine

## 2013-06-05 ENCOUNTER — Inpatient Hospital Stay (HOSPITAL_COMMUNITY)
Admission: EM | Admit: 2013-06-05 | Discharge: 2013-06-16 | DRG: 602 | Disposition: A | Discharge status: Skilled Nursing Facility | Payer: Medicare Other | Attending: Internal Medicine | Admitting: Internal Medicine

## 2013-06-05 DIAGNOSIS — J440 Chronic obstructive pulmonary disease with acute lower respiratory infection: Secondary | ICD-10-CM | POA: Diagnosis present

## 2013-06-05 DIAGNOSIS — Y9210 Unspecified residential institution as the place of occurrence of the external cause: Secondary | ICD-10-CM | POA: Diagnosis not present

## 2013-06-05 DIAGNOSIS — Z79899 Other long term (current) drug therapy: Secondary | ICD-10-CM

## 2013-06-05 DIAGNOSIS — M129 Arthropathy, unspecified: Secondary | ICD-10-CM | POA: Diagnosis present

## 2013-06-05 DIAGNOSIS — R441 Visual hallucinations: Secondary | ICD-10-CM

## 2013-06-05 DIAGNOSIS — D649 Anemia, unspecified: Secondary | ICD-10-CM

## 2013-06-05 DIAGNOSIS — I509 Heart failure, unspecified: Secondary | ICD-10-CM

## 2013-06-05 DIAGNOSIS — L03119 Cellulitis of unspecified part of limb: Secondary | ICD-10-CM

## 2013-06-05 DIAGNOSIS — H5316 Psychophysical visual disturbances: Secondary | ICD-10-CM | POA: Diagnosis not present

## 2013-06-05 DIAGNOSIS — J209 Acute bronchitis, unspecified: Secondary | ICD-10-CM | POA: Diagnosis present

## 2013-06-05 DIAGNOSIS — Z96659 Presence of unspecified artificial knee joint: Secondary | ICD-10-CM

## 2013-06-05 DIAGNOSIS — Z9861 Coronary angioplasty status: Secondary | ICD-10-CM

## 2013-06-05 DIAGNOSIS — Z833 Family history of diabetes mellitus: Secondary | ICD-10-CM

## 2013-06-05 DIAGNOSIS — I5033 Acute on chronic diastolic (congestive) heart failure: Secondary | ICD-10-CM

## 2013-06-05 DIAGNOSIS — W1811XA Fall from or off toilet without subsequent striking against object, initial encounter: Secondary | ICD-10-CM | POA: Diagnosis not present

## 2013-06-05 DIAGNOSIS — L0390 Cellulitis, unspecified: Secondary | ICD-10-CM

## 2013-06-05 DIAGNOSIS — S52599A Other fractures of lower end of unspecified radius, initial encounter for closed fracture: Secondary | ICD-10-CM | POA: Diagnosis not present

## 2013-06-05 DIAGNOSIS — N183 Chronic kidney disease, stage 3 unspecified: Secondary | ICD-10-CM

## 2013-06-05 DIAGNOSIS — L97409 Non-pressure chronic ulcer of unspecified heel and midfoot with unspecified severity: Secondary | ICD-10-CM | POA: Diagnosis present

## 2013-06-05 DIAGNOSIS — Z7901 Long term (current) use of anticoagulants: Secondary | ICD-10-CM

## 2013-06-05 DIAGNOSIS — K219 Gastro-esophageal reflux disease without esophagitis: Secondary | ICD-10-CM | POA: Diagnosis present

## 2013-06-05 DIAGNOSIS — K922 Gastrointestinal hemorrhage, unspecified: Secondary | ICD-10-CM

## 2013-06-05 DIAGNOSIS — I4891 Unspecified atrial fibrillation: Secondary | ICD-10-CM

## 2013-06-05 DIAGNOSIS — Z6841 Body Mass Index (BMI) 40.0 and over, adult: Secondary | ICD-10-CM

## 2013-06-05 DIAGNOSIS — S62102A Fracture of unspecified carpal bone, left wrist, initial encounter for closed fracture: Secondary | ICD-10-CM

## 2013-06-05 DIAGNOSIS — D509 Iron deficiency anemia, unspecified: Secondary | ICD-10-CM | POA: Diagnosis present

## 2013-06-05 DIAGNOSIS — I129 Hypertensive chronic kidney disease with stage 1 through stage 4 chronic kidney disease, or unspecified chronic kidney disease: Secondary | ICD-10-CM | POA: Diagnosis present

## 2013-06-05 DIAGNOSIS — N179 Acute kidney failure, unspecified: Secondary | ICD-10-CM

## 2013-06-05 DIAGNOSIS — J9600 Acute respiratory failure, unspecified whether with hypoxia or hypercapnia: Secondary | ICD-10-CM | POA: Diagnosis not present

## 2013-06-05 DIAGNOSIS — J9601 Acute respiratory failure with hypoxia: Secondary | ICD-10-CM

## 2013-06-05 DIAGNOSIS — I4821 Permanent atrial fibrillation: Secondary | ICD-10-CM | POA: Diagnosis present

## 2013-06-05 DIAGNOSIS — L02419 Cutaneous abscess of limb, unspecified: Principal | ICD-10-CM

## 2013-06-05 DIAGNOSIS — E119 Type 2 diabetes mellitus without complications: Secondary | ICD-10-CM

## 2013-06-05 DIAGNOSIS — W19XXXA Unspecified fall, initial encounter: Secondary | ICD-10-CM

## 2013-06-05 DIAGNOSIS — I2510 Atherosclerotic heart disease of native coronary artery without angina pectoris: Secondary | ICD-10-CM | POA: Diagnosis present

## 2013-06-05 DIAGNOSIS — E785 Hyperlipidemia, unspecified: Secondary | ICD-10-CM | POA: Diagnosis present

## 2013-06-05 DIAGNOSIS — Z951 Presence of aortocoronary bypass graft: Secondary | ICD-10-CM

## 2013-06-05 HISTORY — DX: Acute respiratory failure with hypoxia: J96.01

## 2013-06-05 HISTORY — DX: Fracture of unspecified carpal bone, left wrist, initial encounter for closed fracture: S62.102A

## 2013-06-05 LAB — COMPREHENSIVE METABOLIC PANEL
ALT: 17 U/L (ref 0–35)
Alkaline Phosphatase: 87 U/L (ref 39–117)
CO2: 26 mEq/L (ref 19–32)
Chloride: 94 mEq/L — ABNORMAL LOW (ref 96–112)
GFR calc Af Amer: 27 mL/min — ABNORMAL LOW (ref >90)
Glucose, Bld: 258 mg/dL — ABNORMAL HIGH (ref 70–99)
Potassium: 3.9 mEq/L (ref 3.5–5.1)
Sodium: 132 mEq/L — ABNORMAL LOW (ref 135–145)
Total Bilirubin: 0.5 mg/dL (ref 0.3–1.2)
Total Protein: 7 g/dL (ref 6.0–8.3)

## 2013-06-05 LAB — CBC WITH DIFFERENTIAL/PLATELET
Eosinophils Absolute: 0 10*3/uL (ref 0.0–0.7)
Hemoglobin: 11 g/dL — ABNORMAL LOW (ref 12.0–15.0)
Lymphocytes Relative: 10 % — ABNORMAL LOW (ref 12–46)
Lymphs Abs: 1.4 10*3/uL (ref 0.7–4.0)
Neutro Abs: 12.5 10*3/uL — ABNORMAL HIGH (ref 1.7–7.7)
Neutrophils Relative %: 84 % — ABNORMAL HIGH (ref 43–77)
Platelets: 228 10*3/uL (ref 150–400)
RBC: 3.92 MIL/uL (ref 3.87–5.11)
WBC: 14.9 10*3/uL — ABNORMAL HIGH (ref 4.0–10.5)

## 2013-06-05 MED ORDER — SODIUM CHLORIDE 0.9 % IV SOLN
INTRAVENOUS | Status: DC
  Administered 2013-06-05: 17:00:00 via INTRAVENOUS

## 2013-06-05 MED ORDER — VANCOMYCIN HCL 10 G IV SOLR: 1500.0000 mg | INTRAVENOUS | Status: DC

## 2013-06-05 MED ORDER — TRAMADOL HCL 50 MG PO TABS
50.0000 mg | ORAL_TABLET | ORAL | Status: DC | PRN
  Administered 2013-06-07 – 2013-06-13 (×3): 50 mg via ORAL
  Filled 2013-06-05 (×3): qty 1

## 2013-06-05 MED ORDER — METOPROLOL SUCCINATE ER 25 MG PO TB24
25.0000 mg | ORAL_TABLET | Freq: Every day | ORAL | Status: DC
  Administered 2013-06-05 – 2013-06-15 (×11): 25 mg via ORAL
  Filled 2013-06-05 (×11): qty 1

## 2013-06-05 MED ORDER — SODIUM CHLORIDE 0.9 % IV SOLN
INTRAVENOUS | Status: DC
  Administered 2013-06-05 – 2013-06-07 (×2): via INTRAVENOUS

## 2013-06-05 MED ORDER — FOLIC ACID 1 MG PO TABS
1.0000 mg | ORAL_TABLET | Freq: Two times a day (BID) | ORAL | Status: DC
  Administered 2013-06-05 – 2013-06-16 (×22): 1 mg via ORAL
  Filled 2013-06-05 (×22): qty 1

## 2013-06-05 MED ORDER — SODIUM CHLORIDE 0.45 % IV SOLN: INTRAVENOUS | Status: DC

## 2013-06-05 MED ORDER — ONDANSETRON HCL 4 MG/2ML IJ SOLN: 4.0000 mg | Freq: Four times a day (QID) | INTRAMUSCULAR | Status: DC | PRN

## 2013-06-05 MED ORDER — PANTOPRAZOLE SODIUM 40 MG PO TBEC
40.0000 mg | DELAYED_RELEASE_TABLET | Freq: Every morning | ORAL | Status: DC
  Administered 2013-06-06 – 2013-06-16 (×11): 40 mg via ORAL
  Filled 2013-06-05 (×11): qty 1

## 2013-06-05 MED ORDER — CITALOPRAM HYDROBROMIDE 20 MG PO TABS
10.0000 mg | ORAL_TABLET | Freq: Every day | ORAL | Status: DC
  Administered 2013-06-06 – 2013-06-15 (×10): 10 mg via ORAL
  Filled 2013-06-05 (×10): qty 1

## 2013-06-05 MED ORDER — ACETAMINOPHEN 325 MG PO TABS
650.0000 mg | ORAL_TABLET | Freq: Four times a day (QID) | ORAL | Status: DC | PRN
  Administered 2013-06-10: 650 mg via ORAL
  Filled 2013-06-05: qty 2

## 2013-06-05 MED ORDER — MAGNESIUM OXIDE 400 (241.3 MG) MG PO TABS
400.0000 mg | ORAL_TABLET | Freq: Every morning | ORAL | Status: DC
  Administered 2013-06-06 – 2013-06-16 (×11): 400 mg via ORAL
  Filled 2013-06-05 (×11): qty 1

## 2013-06-05 MED ORDER — OXYBUTYNIN CHLORIDE 5 MG PO TABS
5.0000 mg | ORAL_TABLET | Freq: Two times a day (BID) | ORAL | Status: DC
  Administered 2013-06-05 – 2013-06-16 (×22): 5 mg via ORAL
  Filled 2013-06-05 (×22): qty 1

## 2013-06-05 MED ORDER — PIPERACILLIN-TAZOBACTAM 3.375 G IVPB
INTRAVENOUS | Status: AC
  Filled 2013-06-05: qty 100

## 2013-06-05 MED ORDER — HYDROMORPHONE HCL PF 1 MG/ML IJ SOLN
1.0000 mg | INTRAMUSCULAR | Status: AC | PRN
  Administered 2013-06-05: 1 mg via INTRAVENOUS
  Filled 2013-06-05: qty 1

## 2013-06-05 MED ORDER — LEVOTHYROXINE SODIUM 25 MCG PO TABS
125.0000 ug | ORAL_TABLET | Freq: Every day | ORAL | Status: DC
  Administered 2013-06-06 – 2013-06-16 (×11): 125 ug via ORAL
  Filled 2013-06-05 (×22): qty 1

## 2013-06-05 MED ORDER — ONDANSETRON HCL 4 MG/2ML IJ SOLN
4.0000 mg | Freq: Three times a day (TID) | INTRAMUSCULAR | Status: DC | PRN
  Administered 2013-06-05: 4 mg via INTRAVENOUS
  Filled 2013-06-05: qty 2

## 2013-06-05 MED ORDER — POLYSACCHARIDE IRON COMPLEX 150 MG PO CAPS
150.0000 mg | ORAL_CAPSULE | Freq: Two times a day (BID) | ORAL | Status: DC
  Administered 2013-06-05 – 2013-06-16 (×22): 150 mg via ORAL
  Filled 2013-06-05 (×22): qty 1

## 2013-06-05 MED ORDER — HYDROMORPHONE HCL PF 1 MG/ML IJ SOLN
1.0000 mg | INTRAMUSCULAR | Status: DC | PRN
Start: 2013-06-05 — End: 2013-06-16
  Administered 2013-06-06 – 2013-06-09 (×5): 1 mg via INTRAVENOUS
  Filled 2013-06-05 (×6): qty 1

## 2013-06-05 MED ORDER — INSULIN ASPART 100 UNIT/ML ~~LOC~~ SOLN
0.0000 [IU] | Freq: Three times a day (TID) | SUBCUTANEOUS | Status: DC
Start: 2013-06-06 — End: 2013-06-16
  Administered 2013-06-06: 5 [IU] via SUBCUTANEOUS
  Administered 2013-06-06: 2 [IU] via SUBCUTANEOUS
  Administered 2013-06-06: 3 [IU] via SUBCUTANEOUS
  Administered 2013-06-07: 5 [IU] via SUBCUTANEOUS
  Administered 2013-06-07: 2 [IU] via SUBCUTANEOUS
  Administered 2013-06-07: 3 [IU] via SUBCUTANEOUS
  Administered 2013-06-08: 2 [IU] via SUBCUTANEOUS
  Administered 2013-06-08 – 2013-06-09 (×3): 3 [IU] via SUBCUTANEOUS
  Administered 2013-06-09: 5 [IU] via SUBCUTANEOUS
  Administered 2013-06-09: 3 [IU] via SUBCUTANEOUS
  Administered 2013-06-10: 5 [IU] via SUBCUTANEOUS
  Administered 2013-06-10: 3 [IU] via SUBCUTANEOUS
  Administered 2013-06-10: 2 [IU] via SUBCUTANEOUS
  Administered 2013-06-11: 3 [IU] via SUBCUTANEOUS
  Administered 2013-06-11: 5 [IU] via SUBCUTANEOUS
  Administered 2013-06-11 – 2013-06-12 (×2): 3 [IU] via SUBCUTANEOUS
  Administered 2013-06-12: 5 [IU] via SUBCUTANEOUS
  Administered 2013-06-12 – 2013-06-13 (×2): 3 [IU] via SUBCUTANEOUS
  Administered 2013-06-13 (×2): 2 [IU] via SUBCUTANEOUS
  Administered 2013-06-14 – 2013-06-15 (×2): 3 [IU] via SUBCUTANEOUS
  Administered 2013-06-15: 2 [IU] via SUBCUTANEOUS
  Administered 2013-06-15 – 2013-06-16 (×2): 5 [IU] via SUBCUTANEOUS

## 2013-06-05 MED ORDER — ALLOPURINOL 100 MG PO TABS
100.0000 mg | ORAL_TABLET | Freq: Every morning | ORAL | Status: DC
  Administered 2013-06-06 – 2013-06-16 (×11): 100 mg via ORAL
  Filled 2013-06-05 (×11): qty 1

## 2013-06-05 MED ORDER — PIPERACILLIN-TAZOBACTAM 3.375 G IVPB
3.3750 g | Freq: Three times a day (TID) | INTRAVENOUS | Status: DC
  Administered 2013-06-05 – 2013-06-10 (×15): 3.375 g via INTRAVENOUS
  Filled 2013-06-05 (×18): qty 50

## 2013-06-05 MED ORDER — VANCOMYCIN HCL 10 G IV SOLR
1500.0000 mg | INTRAVENOUS | Status: DC
  Administered 2013-06-07 – 2013-06-13 (×4): 1500 mg via INTRAVENOUS
  Filled 2013-06-05 (×5): qty 1500

## 2013-06-05 MED ORDER — AMIODARONE HCL 200 MG PO TABS
200.0000 mg | ORAL_TABLET | Freq: Every morning | ORAL | Status: DC
  Administered 2013-06-06 – 2013-06-16 (×11): 200 mg via ORAL
  Filled 2013-06-05 (×11): qty 1

## 2013-06-05 MED ORDER — VANCOMYCIN HCL IN DEXTROSE 1-5 GM/200ML-% IV SOLN
1000.0000 mg | Freq: Once | INTRAVENOUS | Status: AC
  Administered 2013-06-05: 1000 mg via INTRAVENOUS
  Filled 2013-06-05: qty 200

## 2013-06-05 MED ORDER — ASPIRIN 81 MG PO CHEW
81.0000 mg | CHEWABLE_TABLET | Freq: Every day | ORAL | Status: DC
  Administered 2013-06-06 – 2013-06-15 (×10): 81 mg via ORAL
  Filled 2013-06-05 (×10): qty 1

## 2013-06-05 NOTE — Telephone Encounter (Signed)
Called pt to set up fu appt for device ck, former [employer] pt and pt states she has apt with a dr best in [place] in January to have this checked/mt

## 2013-06-05 NOTE — Progress Notes (Signed)
ANTIBIOTIC CONSULT NOTE - INITIAL  Pharmacy Consult for Vancomycin & Zosyn Indication: cellulitis  No Known Allergies  Patient Measurements: Height: 5\' 4"  (162.6 cm) Weight: 251 lb (113.853 kg) IBW/kg (Calculated) : 54.7  Vital Signs: Temp: 98.7 F (37.1 C) (11/06 1341) Temp src: Oral (11/06 1341) BP: 151/77 mmHg (11/06 1341) Pulse Rate: 98 (11/06 1341) Intake/Output from previous day:   Intake/Output from this shift:    Labs:  Recent Labs  06/05/13 1407  WBC 14.9*  HGB 11.0*  PLT 228  CREATININE 1.97*   Estimated Creatinine Clearance: 29.1 ml/min (by C-G formula based on Cr of 1.97). No results found for this basename: VANCOTROUGH, VANCOPEAK, VANCORANDOM, GENTTROUGH, GENTPEAK, GENTRANDOM, TOBRATROUGH, TOBRAPEAK, TOBRARND, AMIKACINPEAK, AMIKACINTROU, AMIKACIN,  in the last 72 hours   Microbiology: No results found for this or any previous visit (from the past 720 hour(s)).  Medical History: Past Medical History  Diagnosis Date  . Anemia 2008  . DM (diabetes mellitus) 1999  . Cataract     BIL  . Atrial fibrillation   . CAD (coronary artery disease)   . CRI (chronic renal insufficiency) 2008  . Hypertension   . Hyperlipemia   . GERD (gastroesophageal reflux disease)   . Arthritis     Medications:  Scheduled:    Assessment: 79 yo obese F with hx DM admitted with RLE cellulitis. She has completed outpatient course of Doxycycline ~ 3 weeks ago and is currently on Augmentin with worsening symptoms.  Admit for IV antibiotics and r/o osteo, abscess.  Patient is noted to have chronic renal insufficiency. Normalized CrCl ~ 25-30 ml/min.  Vancomycin 11/6>> Zosyn 11/6>>  Goal of Therapy:  Vancomycin trough 15-33mcg/ml until osteo ruled out  Plan:  Zosyn 3.375gm IV Q8h to be infused over 4hrs Vancomycin 1500mg  IV q48h Check Vancomycin trough at steady state Monitor renal function and cx data   Elson Clan 06/05/2013,4:39 PM

## 2013-06-05 NOTE — ED Notes (Addendum)
Pt prevents with left leg edema and weeping/draining wound. Cellulitis noted. Pt c/o severe pain in said leg and unable to bare weight on left leg. Pulses equal in bilateral lower extremities.

## 2013-06-05 NOTE — H&P (Signed)
[facility] History and Physical  ILLENE SWEETING [name]:[id number] DOB: Jun 30, 1933 DOA: 06/05/2013  Referring physician: EDP PCP: Cassell Smiles., MD  Specialists: Cardiology Dr. Susa Griffins  Chief Complaint: Right leg pain and swelling  HPI: [name] is a 79 y.o. female with past history of type 2 diabetes, CAD/CABG, paroxysmal A. fib on Coumadin, CK-MB 3, obesity presents to the ER today with worsening pain and swelling involving her right lower leg. She reports noticing an area of redness, pain and swelling in her right lower leg about 3 weeks ago, presented to independent ER then, underwent small I&D under local anesthesia and was discharged home on oral doxycycline, she temporarily got better and then got worse again went to her M.D. and was started on Augmentin a week ago. In the past 3 days she's noticed significant worsening of swelling, increased pain and purulent drainage from couple of areas along with fevers and chills and hence presented to the emergency room today.   Review of Systems: The patient denies anorexia, fever, weight loss,, vision loss, decreased hearing, hoarseness, chest pain, syncope, dyspnea on exertion, peripheral edema, balance deficits, hemoptysis, abdominal pain, melena, hematochezia, severe indigestion/heartburn, hematuria, incontinence, genital sores, muscle weakness, suspicious skin lesions, transient blindness, difficulty walking, depression, unusual weight change, abnormal bleeding, enlarged lymph nodes, angioedema, and breast masses.    Past Medical History  Diagnosis Date  . Anemia 2008  . DM (diabetes mellitus) 1999  . Cataract     BIL  . Atrial fibrillation   . CAD (coronary artery disease)   . CRI (chronic renal insufficiency) 2008  . Hypertension   . Hyperlipemia   . GERD (gastroesophageal reflux disease)   . Arthritis    Past Surgical History  Procedure Laterality Date  . Esophagogastroduodenoscopy  11/07/2007     [id number] Schatzki's ring.  Otherwise normal esophagus   . Appendectomy      APPENDICITIS  . Coronary artery bypass graft  MAR 2009  . Pacemaker insertion  MAR 2009  . Total knee arthroplasty Right     2012  . Eye surgery Left   . Coronary angioplasty    . Back surgery    . Tonsillectomy      age 44  . Insert / replace / remove pacemaker    . Joint replacement Right   . Colonoscopy with esophagogastroduodenoscopy (egd) N/A 11/25/2012    SLF:Two SMALL AC polyps/Small internal hemorrhoids/The colon IS SLIGHTLY redundant-EGD:Schatzki ring at the gastroesophageal junction/Small hiatal hernia/MILD Non-erosive gastritis   Social History:  reports that she has never smoked. She does not have any smokeless tobacco history on file. She reports that she does not drink alcohol or use illicit drugs. Lives at home with daughter, independent in ADLs  No Known Allergies  Family History  Problem Relation Age of Onset  . Colon cancer Neg Hx   . Colon polyps Neg Hx   . Cancer - Other Mother     hogkins disease  . Diabetes Other     Prior to Admission medications   Medication Sig Start Date End Date Taking? Authorizing Provider  allopurinol (ZYLOPRIM) 100 MG tablet Take 100 mg by mouth every morning.    Yes Historical Provider, MD  amiodarone (PACERONE) 200 MG tablet Take 200 mg by mouth every morning.  08/14/12  Yes Historical Provider, MD  amoxicillin-clavulanate (AUGMENTIN) 875-125 MG per tablet Take 1 tablet by mouth 2 (two) times daily. Started on 05/28/13 05/28/13  Yes Historical Provider, MD  aspirin 81  MG tablet Take 162 mg by mouth daily.    Yes Historical Provider, MD  citalopram (CELEXA) 10 MG tablet Take 10 mg by mouth every morning.  08/14/12  Yes Historical Provider, MD  enalapril (VASOTEC) 10 MG tablet Take 10 mg by mouth every morning.  08/14/12  Yes Historical Provider, MD  folic acid (FOLVITE) 1 MG tablet Take 1 mg by mouth 2 (two) times daily.   Yes Historical Provider, MD   furosemide (LASIX) 80 MG tablet Take 1 tablet (80 mg total) by mouth 2 (two) times daily. 11/21/12  Yes Christiane Ha, MD  GLIPIZIDE XL 10 MG 24 hr tablet Take 10 mg by mouth every morning.  08/14/12  Yes Historical Provider, MD  iron polysaccharides (NIFEREX) 150 MG capsule Take 150 mg by mouth 2 (two) times daily.   Yes Historical Provider, MD  levothyroxine (SYNTHROID, LEVOTHROID) 125 MCG tablet Take 125 mcg by mouth every morning.  08/14/12  Yes Historical Provider, MD  magnesium oxide (MAG-OX) 400 MG tablet Take 400 mg by mouth every morning.    Yes Historical Provider, MD  metoprolol succinate (TOPROL-XL) 25 MG 24 hr tablet Take 1 tablet by mouth at bedtime. 06/02/13  Yes Historical Provider, MD  oxybutynin (DITROPAN) 5 MG tablet Take 5 mg by mouth 2 (two) times daily.  08/14/12  Yes Historical Provider, MD  pantoprazole (PROTONIX) 40 MG tablet Take 40 mg by mouth every morning.  10/29/12  Yes Historical Provider, MD  potassium chloride SA (K-DUR,KLOR-CON) 20 MEQ tablet Take 1 tablet (20 mEq total) by mouth 2 (two) times daily. 11/21/12  Yes Christiane Ha, MD  pravastatin (PRAVACHOL) 40 MG tablet Take 40 mg by mouth daily.  08/14/12  Yes Historical Provider, MD  traMADol (ULTRAM) 50 MG tablet Take 50 mg by mouth every 4 (four) hours as needed for pain.   Yes Historical Provider, MD  warfarin (COUMADIN) 5 MG tablet Take 2.5-5 mg by mouth daily. Patient takes 1 tablet(5mg )on Tues.and Thurs. And 1/2 tablet(2.5mg )all other days 06/02/13  Yes Historical Provider, MD   Physical Exam: Filed Vitals:   06/05/13 1341  BP: 151/77  Pulse: 98  Temp: 98.7 F (37.1 C)     General:  AAOx3, ill appearing  HEENT: PERRLA, EOMI, oral mucosa dry  Cardiovascular: S1-S2 regular rate rhythm no murmurs rubs or gallops  Respiratory: Clear to auscultation bilaterally  Abdomen: Soft obese nontender and nondistended positive bowel sounds  Skin: Multiple areas of skin breakdown on the right lower  extremity  Musculoskeletal: Right lower leg with diffuse distal swelling, erythema and multiple areas of skin breakdown with purulent drainage, swelling extending to the forefoot, diminished distal dorsalis pedis pulsations  Psychiatric: Appropriate mood and affect   Neurologic: Nonfocal  Labs on Admission:  Basic Metabolic Panel:  Recent Labs Lab 06/05/13 1407  NA 132*  K 3.9  CL 94*  CO2 26  GLUCOSE 258*  BUN 42*  CREATININE 1.97*  CALCIUM 9.1   Liver Function Tests:  Recent Labs Lab 06/05/13 1407  AST 20  ALT 17  ALKPHOS 87  BILITOT 0.5  PROT 7.0  ALBUMIN 2.7*   No results found for this basename: LIPASE, AMYLASE,  in the last 168 hours No results found for this basename: AMMONIA,  in the last 168 hours CBC:  Recent Labs Lab 06/05/13 1407  WBC 14.9*  NEUTROABS 12.5*  HGB 11.0*  HCT 34.5*  MCV 88.0  PLT 228   Cardiac Enzymes: No results found for this  basename: CKTOTAL, CKMB, CKMBINDEX, TROPONINI,  in the last 168 hours  BNP (last 3 results)  Recent Labs  11/19/12 1504  PROBNP 2466.0*   CBG: No results found for this basename: GLUCAP,  in the last 168 hours  Radiological Exams on Admission: No results found.   Assessment/Plan    1. Cellulitis and suspected abscess of leg -will admit, start IV Vanc/Zosyn -check MRI R leg and foot, rule out abscess/osteo, will most likely need I&D but will await MRI before ortho involvement -hold coumadin, check INR  2. ARF on CKD -baseline creatinine 1.6, gentle hydration -hold enalapril  3. DM -check Hbaic, hold oral hypoglycemics -SSI  4. P.Afib -rate controlled, continue metoprolol -hold coumadin, since will likely need I&D,  check INR  5. CAD/CABG -stable, continue ASA/Metoprolol  DVt proph: start once INR drifts down  Code Status: Full Family Communication: d/w son at bedside Disposition Plan: inpatient  Time spent:53min  [facility] Pager [phone]  If  7PM-7AM, please contact night-coverage [email or web address] Password [facility] 06/05/2013, 4:23 PM

## 2013-06-05 NOTE — ED Provider Notes (Signed)
CSN: [id number]     Arrival date & time 06/05/13  1331 History   First MD Initiated Contact with Patient 06/05/13 1458     Chief Complaint  Patient presents with  . Wound Infection   (Consider location/radiation/quality/duration/timing/severity/associated sxs/prior Treatment) HPI Comments: Patient is a 79 year old female with past medical history significant for diabetes and coronary artery disease, status post bypass graft 5 or 6 years ago. She presents today with complaints of a several week history of redness, swelling, and pain in the left lower extremity. She was seen here 2 weeks ago and diagnosed with cellulitis. She was given oral antibiotics which several days later were changed by her primary care physician to a different, more powerful antibiotic. She has taken this but she is now worsening to the point she is having difficulty walking due to pain. She denies to me she is having any fevers or chills. She denies any nausea or vomiting. She has had similar episodes in the past.  The history is provided by the patient.    Past Medical History  Diagnosis Date  . Anemia 2008  . DM (diabetes mellitus) 1999  . Cataract     BIL  . Atrial fibrillation   . CAD (coronary artery disease)   . CRI (chronic renal insufficiency) 2008  . Hypertension   . Hyperlipemia   . GERD (gastroesophageal reflux disease)   . Arthritis    Past Surgical History  Procedure Laterality Date  . Esophagogastroduodenoscopy  11/07/2007    WUJ:[id number] Schatzki's ring.  Otherwise normal esophagus   . Appendectomy      APPENDICITIS  . Coronary artery bypass graft  MAR 2009  . Pacemaker insertion  MAR 2009  . Total knee arthroplasty Right     2012  . Eye surgery Left   . Coronary angioplasty    . Back surgery    . Tonsillectomy      age 52  . Insert / replace / remove pacemaker    . Joint replacement Right   . Colonoscopy with esophagogastroduodenoscopy (egd) N/A 11/25/2012    SLF:Two SMALL AC  polyps/Small internal hemorrhoids/The colon IS SLIGHTLY redundant-EGD:Schatzki ring at the gastroesophageal junction/Small hiatal hernia/MILD Non-erosive gastritis   Family History  Problem Relation Age of Onset  . Colon cancer Neg Hx   . Colon polyps Neg Hx   . Cancer - Other Mother     hogkins disease  . Diabetes Other    History  Substance Use Topics  . Smoking status: Never Smoker   . Smokeless tobacco: Not on file  . Alcohol Use: No   OB History   Grav Para Term Preterm Abortions TAB SAB Ect Mult Living                 Review of Systems  All other systems reviewed and are negative.    Allergies  Review of patient's allergies indicates no known allergies.  Home Medications   Current Outpatient Rx  Name  Route  Sig  Dispense  Refill  . allopurinol (ZYLOPRIM) 100 MG tablet   Oral   Take 100 mg by mouth every morning.          Marland Kitchen amiodarone (PACERONE) 200 MG tablet   Oral   Take 200 mg by mouth every morning.          Marland Kitchen amoxicillin-clavulanate (AUGMENTIN) 875-125 MG per tablet   Oral   Take 1 tablet by mouth 2 (two) times daily. Started on 05/28/13         .  aspirin 81 MG tablet   Oral   Take 162 mg by mouth daily.          . citalopram (CELEXA) 10 MG tablet   Oral   Take 10 mg by mouth every morning.          . enalapril (VASOTEC) 10 MG tablet   Oral   Take 10 mg by mouth every morning.          . folic acid (FOLVITE) 1 MG tablet   Oral   Take 1 mg by mouth 2 (two) times daily.         . furosemide (LASIX) 80 MG tablet   Oral   Take 1 tablet (80 mg total) by mouth 2 (two) times daily.   60 tablet   0   . GLIPIZIDE XL 10 MG 24 hr tablet   Oral   Take 10 mg by mouth every morning.          . iron polysaccharides (NIFEREX) 150 MG capsule   Oral   Take 150 mg by mouth 2 (two) times daily.         Marland Kitchen levothyroxine (SYNTHROID, LEVOTHROID) 125 MCG tablet   Oral   Take 125 mcg by mouth every morning.          . magnesium oxide  (MAG-OX) 400 MG tablet   Oral   Take 400 mg by mouth every morning.          . metoprolol succinate (TOPROL-XL) 25 MG 24 hr tablet   Oral   Take 1 tablet by mouth at bedtime.         Marland Kitchen oxybutynin (DITROPAN) 5 MG tablet   Oral   Take 5 mg by mouth 2 (two) times daily.          . pantoprazole (PROTONIX) 40 MG tablet   Oral   Take 40 mg by mouth every morning.          . potassium chloride SA (K-DUR,KLOR-CON) 20 MEQ tablet   Oral   Take 1 tablet (20 mEq total) by mouth 2 (two) times daily.   60 tablet   0     Dispense as written.   . pravastatin (PRAVACHOL) 40 MG tablet   Oral   Take 40 mg by mouth daily.          . traMADol (ULTRAM) 50 MG tablet   Oral   Take 50 mg by mouth every 4 (four) hours as needed for pain.         Marland Kitchen warfarin (COUMADIN) 5 MG tablet   Oral   Take 2.5-5 mg by mouth daily. Patient takes 1 tablet(5mg )on Tues.and Thurs. And 1/2 tablet(2.5mg )all other days          BP 151/77  Pulse 98  Temp(Src) 98.7 F (37.1 C) (Oral)  Ht 5\' 4"  (1.626 m)  Wt 251 lb (113.853 kg)  BMI 43.06 kg/m2  SpO2 97% Physical Exam  Nursing note and vitals reviewed. Constitutional: She is oriented to person, place, and time. She appears well-developed and well-nourished. No distress.  HENT:  Head: Normocephalic and atraumatic.  Neck: Normal range of motion. Neck supple.  Cardiovascular: Normal rate and regular rhythm.  Exam reveals no gallop and no friction rub.   No murmur heard. Pulmonary/Chest: Effort normal and breath sounds normal. No respiratory distress. She has no wheezes.  Abdominal: Soft. Bowel sounds are normal. She exhibits no distension. There is no tenderness.  Musculoskeletal: Normal range of  motion.  Neurological: She is alert and oriented to person, place, and time.  Skin: Skin is warm and dry. She is not diaphoretic.  The left lower extremity is noted to have 3-4+ edema with significant redness and warmth. There are also small pustules noted  on the anterior aspect of the lower leg. Dorsalis pedis pulses are faintly palpable through the edema.    ED Course  Procedures (including critical care time) Labs Review Labs Reviewed  CBC WITH DIFFERENTIAL  COMPREHENSIVE METABOLIC PANEL   Imaging Review No results found.  EKG Interpretation   None       MDM  No diagnosis found. Patient is a 79 year old female with history of diabetes who presents with worsening leg cellulitis despite oral antibiotics for the past 2 weeks. She has a white count of 15,000 and a markedly swollen, warm, red leg that is dripping pus. She was given vancomycin in the emergency department and I have spoken with Dr. Jomarie Longs who agrees to admit the patient for IV antibiotics.    Geoffery Lyons, MD 06/05/13 [phone]

## 2013-06-06 ENCOUNTER — Inpatient Hospital Stay (HOSPITAL_COMMUNITY): Payer: Medicare Other

## 2013-06-06 LAB — BASIC METABOLIC PANEL WITH GFR
BUN: 46 mg/dL — ABNORMAL HIGH (ref 6–23)
CO2: 27 meq/L (ref 19–32)
Calcium: 8.9 mg/dL (ref 8.4–10.5)
Chloride: 97 meq/L (ref 96–112)
Creatinine, Ser: 2.38 mg/dL — ABNORMAL HIGH (ref 0.50–1.10)
GFR calc Af Amer: 21 mL/min — ABNORMAL LOW
GFR calc non Af Amer: 18 mL/min — ABNORMAL LOW
Glucose, Bld: 164 mg/dL — ABNORMAL HIGH (ref 70–99)
Potassium: 3.8 meq/L (ref 3.5–5.1)
Sodium: 134 meq/L — ABNORMAL LOW (ref 135–145)

## 2013-06-06 LAB — CBC
MCH: 28.1 pg (ref 26.0–34.0)
MCHC: 31.7 g/dL (ref 30.0–36.0)
MCV: 88.5 fL (ref 78.0–100.0)
Platelets: 189 10*3/uL (ref 150–400)
RDW: 13.9 % (ref 11.5–15.5)
WBC: 8.8 10*3/uL (ref 4.0–10.5)

## 2013-06-06 LAB — PROTIME-INR
INR: 2.14 — ABNORMAL HIGH (ref 0.00–1.49)
Prothrombin Time: 23.2 s — ABNORMAL HIGH (ref 11.6–15.2)

## 2013-06-06 LAB — GLUCOSE, CAPILLARY
Glucose-Capillary: 143 mg/dL — ABNORMAL HIGH (ref 70–99)
Glucose-Capillary: 165 mg/dL — ABNORMAL HIGH (ref 70–99)

## 2013-06-06 LAB — HEMOGLOBIN A1C
Hgb A1c MFr Bld: 9.7 % — ABNORMAL HIGH
Mean Plasma Glucose: 232 mg/dL — ABNORMAL HIGH

## 2013-06-06 MED ORDER — WARFARIN - PHARMACIST DOSING INPATIENT
Status: DC
  Administered 2013-06-07 – 2013-06-09 (×3): 1
  Administered 2013-06-11: 16:00:00

## 2013-06-06 MED ORDER — WARFARIN SODIUM 2.5 MG PO TABS
2.5000 mg | ORAL_TABLET | Freq: Once | ORAL | Status: AC
  Administered 2013-06-06: 2.5 mg via ORAL
  Filled 2013-06-06: qty 1

## 2013-06-06 NOTE — Progress Notes (Signed)
[facility] PROGRESS NOTE  [name] WUJ:[id number] DOB: [birth year] DOA: 06/05/2013 PCP: Cassell Smiles., MD  Assessment/Plan: 1. Cellulitis and suspected abscess of leg  -continue IV Vanc/Zosyn  -get CT MRI Lleg and foot, rule out abscess/osteo,  -hold coumadin, awaiting CT, check INR   2. ARF on CKD  -baseline creatinine 1.6, gentle hydration  -hold enalapril   3. DM  -check Hbaic, hold oral hypoglycemics  -SSI   4. P.Afib  -rate controlled, continue metoprolol  -hold coumadin, may need I&D,   5. CAD/CABG  -stable, continue ASA/Metoprolol   DVt proph: start once INR drifts down   Code Status: Full  Family Communication: none at bedside  Disposition Plan: inpatient    Consultants:  Ortho  HPI/Subjective: Still with a lot of leg pain  Objective: Filed Vitals:   06/06/13 1400  BP: 120/60  Pulse: 84  Temp: 98.6 F (37 C)  Resp: 20    Intake/Output Summary (Last 24 hours) at 06/06/13 2046 Last data filed at 06/06/13 9562  Gross per 24 hour  Intake      0 ml  Output    850 ml  Net   -850 ml   Filed Weights   06/05/13 1339 06/05/13 1739 06/05/13 2300  Weight: 113.853 kg (251 lb) 114.533 kg (252 lb 8 oz) 114.5 kg (252 lb 6.8 oz)    Exam: General: AAOx3, ill appearing HEENT: PERRLA, EOMI, oral mucosa dry Cardiovascular: S1-S2 regular rate rhythm no murmurs rubs or gallops Respiratory: Clear to auscultation bilaterally Abdomen: Soft obese nontender and nondistended positive bowel sounds Skin: Multiple areas of skin breakdown on the right lower extremity Musculoskeletal: Right lower leg with diffuse distal swelling, erythema and multiple areas of skin breakdown with purulent drainage, swelling extending to the forefoot, diminished distal dorsalis pedis pulsations   Data Reviewed: Basic Metabolic Panel:  Recent Labs Lab 06/05/13 1407 06/06/13 0600  NA 132* 134*  K 3.9 3.8  CL 94* 97  CO2 26 27  GLUCOSE 258* 164*  BUN 42* 46*   CREATININE 1.97* 2.38*  CALCIUM 9.1 8.9   Liver Function Tests:  Recent Labs Lab 06/05/13 1407  AST 20  ALT 17  ALKPHOS 87  BILITOT 0.5  PROT 7.0  ALBUMIN 2.7*   No results found for this basename: LIPASE, AMYLASE,  in the last 168 hours No results found for this basename: AMMONIA,  in the last 168 hours CBC:  Recent Labs Lab 06/05/13 1407 06/06/13 0600  WBC 14.9* 8.8  NEUTROABS 12.5*  --   HGB 11.0* 9.8*  HCT 34.5* 30.9*  MCV 88.0 88.5  PLT 228 189   Cardiac Enzymes: No results found for this basename: CKTOTAL, CKMB, CKMBINDEX, TROPONINI,  in the last 168 hours BNP (last 3 results)  Recent Labs  11/19/12 1504  PROBNP 2466.0*   CBG:  Recent Labs Lab 06/05/13 2235 06/06/13 0811 06/06/13 2033  GLUCAP 235* 143* 165*    No results found for this or any previous visit (from the past 240 hour(s)).   Studies: Ct Tibia Fibula Left Wo Contrast  06/06/2013   CLINICAL DATA:  Left lower extremity pain and swelling.  EXAM: CT TIBIA FIBULA LEFT WITHOUT CONTRAST  TECHNIQUE: Standard CT imaging of the left lower extremity was performed with coronal and sagittal reformatted images without contrast.  COMPARISON:  Radiographs 08/01/2010.  FINDINGS: There is a a remote ununited medial tibial plateau fracture along with moderate knee joint degenerative changes. A knee joint effusion is noted.  The tibia and fibula are otherwise intact. No fracture, bone lesion or destructive bony changes to suggest osteomyelitis.  There is diffuse subcutaneous soft tissue swelling/ edema and fluid which is fairly extensive. Findings suggest cellulitis. No gas is seen in the soft tissues and I do not see an obvious discrete drainable soft tissue abscess. No findings to suggest myofasciitis or pyomyositis. Extensive small vessel calcifications are noted.  IMPRESSION: Severe and diffuse cellulitic change without obvious drainable soft tissue abscess.  No CT findings for myofasciitis or osteomyelitis.   Knee joint degenerative changes and remote ununited medial tibial plateau fracture.   Electronically Signed   By: Loralie Champagne M.D.   On: 06/06/2013 10:15    Scheduled Meds: . allopurinol  100 mg Oral q morning - 10a  . amiodarone  200 mg Oral q morning - 10a  . aspirin  81 mg Oral Daily  . citalopram  10 mg Oral Daily  . folic acid  1 mg Oral BID  . insulin aspart  0-15 Units Subcutaneous TID WC  . iron polysaccharides  150 mg Oral BID  . levothyroxine  125 mcg Oral Q breakfast  . magnesium oxide  400 mg Oral q morning - 10a  . metoprolol succinate  25 mg Oral QHS  . oxybutynin  5 mg Oral BID  . pantoprazole  40 mg Oral q morning - 10a  . piperacillin-tazobactam (ZOSYN)  IV  3.375 g Intravenous Q8H  . [START ON 06/07/2013] vancomycin  1,500 mg Intravenous Q48H   Continuous Infusions: . sodium chloride 100 mL/hr (06/06/13 1344)    Principal Problem:   Cellulitis and abscess of leg Active Problems:   Anemia   Atrial fibrillation   Type II or unspecified type diabetes mellitus without mention of complication, not stated as uncontrolled   Chronic kidney disease (CKD), stage III (moderate)    Time spent:    [facility]  [facility] Pager [phone]. If 7PM-7AM, please contact night-coverage at [email or web address], password [facility] 06/06/2013, 8:46 PM  LOS: 1 day

## 2013-06-06 NOTE — Consult Note (Signed)
Reason for Consult:Cellulitis of left lower leg  Referring Physician: Hospitalist  [name] is an 79 y.o. female.  HPI: She has three to four week history of swelling and redness of the left lower leg. About three weeks ago she had I&D in the ER anteriorly and slightly laterally of an area of redness.  No pus was found.  I can find no culture report.  She was placed on doxycyline from the ER.  She saw family doctor several days later and was placed on Amoxicillin.  She got more redness and pain over the last several days and presented here with marked swelling and redness of the left lower leg in the ER on admission.  CT of the leg just done a few hours ago shows diffuse cellulitis without area of definitive fluctuance or "pus pocket".  There are no signs of osteomyelitis either.    She feels better after admission and being on Vancomycin and Zofran since last night.  She denies any trauma.  Past Medical History  Diagnosis Date  . Anemia 2008  . DM (diabetes mellitus) 1999  . Cataract     BIL  . Atrial fibrillation   . CAD (coronary artery disease)   . CRI (chronic renal insufficiency) 2008  . Hypertension   . Hyperlipemia   . GERD (gastroesophageal reflux disease)   . Arthritis     Past Surgical History  Procedure Laterality Date  . Esophagogastroduodenoscopy  11/07/2007    JXB:JYNWGN Schatzki's ring.  Otherwise normal esophagus   . Appendectomy      APPENDICITIS  . Coronary artery bypass graft  MAR 2009  . Pacemaker insertion  MAR 2009  . Total knee arthroplasty Right     2012  . Eye surgery Left   . Coronary angioplasty    . Back surgery    . Tonsillectomy      age 73  . Insert / replace / remove pacemaker    . Joint replacement Right   . Colonoscopy with esophagogastroduodenoscopy (egd) N/A 11/25/2012    SLF:Two SMALL AC polyps/Small internal hemorrhoids/The colon IS SLIGHTLY redundant-EGD:Schatzki ring at the gastroesophageal junction/Small hiatal hernia/MILD  Non-erosive gastritis    Family History  Problem Relation Age of Onset  . Colon cancer Neg Hx   . Colon polyps Neg Hx   . Cancer - Other Mother     hogkins disease  . Diabetes Other     Social History:  reports that she has never smoked. She does not have any smokeless tobacco history on file. She reports that she does not drink alcohol or use illicit drugs.  Allergies: No Known Allergies  Medications: I have reviewed the patient's current medications.  Results for orders placed during the hospital encounter of 06/05/13 (from the past 48 hour(s))  CBC WITH DIFFERENTIAL     Status: Abnormal   Collection Time    06/05/13  2:07 PM      Result Value Range   WBC 14.9 (*) 4.0 - 10.5 K/uL   RBC 3.92  3.87 - 5.11 MIL/uL   Hemoglobin 11.0 (*) 12.0 - 15.0 g/dL   HCT 56.2 (*) 13.0 - 86.5 %   MCV 88.0  78.0 - 100.0 fL   MCH 28.1  26.0 - 34.0 pg   MCHC 31.9  30.0 - 36.0 g/dL   RDW 78.4  69.6 - 29.5 %   Platelets 228  150 - 400 K/uL   Neutrophils Relative % 84 (*) 43 - 77 %  Neutro Abs 12.5 (*) 1.7 - 7.7 K/uL   Lymphocytes Relative 10 (*) 12 - 46 %   Lymphs Abs 1.4  0.7 - 4.0 K/uL   Monocytes Relative 7  3 - 12 %   Monocytes Absolute 1.0  0.1 - 1.0 K/uL   Eosinophils Relative 0  0 - 5 %   Eosinophils Absolute 0.0  0.0 - 0.7 K/uL   Basophils Relative 0  0 - 1 %   Basophils Absolute 0.0  0.0 - 0.1 K/uL  COMPREHENSIVE METABOLIC PANEL     Status: Abnormal   Collection Time    06/05/13  2:07 PM      Result Value Range   Sodium 132 (*) 135 - 145 mEq/L   Potassium 3.9  3.5 - 5.1 mEq/L   Chloride 94 (*) 96 - 112 mEq/L   CO2 26  19 - 32 mEq/L   Glucose, Bld 258 (*) 70 - 99 mg/dL   BUN 42 (*) 6 - 23 mg/dL   Creatinine, Ser 2.59 (*) 0.50 - 1.10 mg/dL   Calcium 9.1  8.4 - 56.3 mg/dL   Total Protein 7.0  6.0 - 8.3 g/dL   Albumin 2.7 (*) 3.5 - 5.2 g/dL   AST 20  0 - 37 U/L   ALT 17  0 - 35 U/L   Alkaline Phosphatase 87  39 - 117 U/L   Total Bilirubin 0.5  0.3 - 1.2 mg/dL   GFR calc  non Af Amer 23 (*) >90 mL/min   GFR calc Af Amer 27 (*) >90 mL/min   Comment: (NOTE)     The eGFR has been calculated using the CKD EPI equation.     This calculation has not been validated in all clinical situations.     eGFR's persistently <90 mL/min signify possible Chronic Kidney     Disease.  HEMOGLOBIN A1C     Status: Abnormal   Collection Time    06/05/13  2:07 PM      Result Value Range   Hemoglobin A1C 9.7 (*) <5.7 %   Comment: (NOTE)                                                                               According to the ADA Clinical Practice Recommendations for 2011, when     HbA1c is used as a screening test:      >=6.5%   Diagnostic of Diabetes Mellitus               (if abnormal result is confirmed)     5.7-6.4%   Increased risk of developing Diabetes Mellitus     References:Diagnosis and Classification of Diabetes Mellitus,Diabetes     Care,2011,34(Suppl 1):S62-S69 and Standards of Medical Care in             Diabetes - 2011,Diabetes Care,2011,34 (Suppl 1):S11-S61.   Mean Plasma Glucose 232 (*) <117 mg/dL   Comment: Performed at [facility]  PROTIME-INR     Status: Abnormal   Collection Time    06/05/13  2:07 PM      Result Value Range   Prothrombin Time 22.1 (*) 11.6 - 15.2 seconds   INR 2.00 (*)  0.00 - 1.49  GLUCOSE, CAPILLARY     Status: Abnormal   Collection Time    06/05/13 10:35 PM      Result Value Range   Glucose-Capillary 235 (*) 70 - 99 mg/dL   Comment 1 Documented in Chart     Comment 2 Notify RN    BASIC METABOLIC PANEL     Status: Abnormal   Collection Time    06/06/13  6:00 AM      Result Value Range   Sodium 134 (*) 135 - 145 mEq/L   Potassium 3.8  3.5 - 5.1 mEq/L   Chloride 97  96 - 112 mEq/L   CO2 27  19 - 32 mEq/L   Glucose, Bld 164 (*) 70 - 99 mg/dL   BUN 46 (*) 6 - 23 mg/dL   Creatinine, Ser 1.61 (*) 0.50 - 1.10 mg/dL   Calcium 8.9  8.4 - 09.6 mg/dL   GFR calc non Af Amer 18 (*) >90 mL/min   GFR calc Af Amer 21 (*)  >90 mL/min   Comment: (NOTE)     The eGFR has been calculated using the CKD EPI equation.     This calculation has not been validated in all clinical situations.     eGFR's persistently <90 mL/min signify possible Chronic Kidney     Disease.  CBC     Status: Abnormal   Collection Time    06/06/13  6:00 AM      Result Value Range   WBC 8.8  4.0 - 10.5 K/uL   RBC 3.49 (*) 3.87 - 5.11 MIL/uL   Hemoglobin 9.8 (*) 12.0 - 15.0 g/dL   HCT 04.5 (*) 40.9 - 81.1 %   MCV 88.5  78.0 - 100.0 fL   MCH 28.1  26.0 - 34.0 pg   MCHC 31.7  30.0 - 36.0 g/dL   RDW 91.4  78.2 - 95.6 %   Platelets 189  150 - 400 K/uL  PROTIME-INR     Status: Abnormal   Collection Time    06/06/13  6:00 AM      Result Value Range   Prothrombin Time 23.2 (*) 11.6 - 15.2 seconds   INR 2.14 (*) 0.00 - 1.49  GLUCOSE, CAPILLARY     Status: Abnormal   Collection Time    06/06/13  8:11 AM      Result Value Range   Glucose-Capillary 143 (*) 70 - 99 mg/dL    Ct Tibia Fibula Left Wo Contrast  06/06/2013   CLINICAL DATA:  Left lower extremity pain and swelling.  EXAM: CT TIBIA FIBULA LEFT WITHOUT CONTRAST  TECHNIQUE: Standard CT imaging of the left lower extremity was performed with coronal and sagittal reformatted images without contrast.  COMPARISON:  Radiographs 08/01/2010.  FINDINGS: There is a a remote ununited medial tibial plateau fracture along with moderate knee joint degenerative changes. A knee joint effusion is noted.  The tibia and fibula are otherwise intact. No fracture, bone lesion or destructive bony changes to suggest osteomyelitis.  There is diffuse subcutaneous soft tissue swelling/ edema and fluid which is fairly extensive. Findings suggest cellulitis. No gas is seen in the soft tissues and I do not see an obvious discrete drainable soft tissue abscess. No findings to suggest myofasciitis or pyomyositis. Extensive small vessel calcifications are noted.  IMPRESSION: Severe and diffuse cellulitic change without  obvious drainable soft tissue abscess.  No CT findings for myofasciitis or osteomyelitis.  Knee joint degenerative changes and remote ununited  medial tibial plateau fracture.   Electronically Signed   By: Loralie Champagne M.D.   On: 06/06/2013 10:15    Review of Systems  Cardiovascular:       History of atrial fibrillation, on Coumadin, history of CHF, heart disease.  Gastrointestinal:       History of chronic lower GI bleeding.  Musculoskeletal:       Redness of left lower leg for about a month or so.  Getting worse last several days.  No falls.  Endo/Heme/Allergies:       Anemia, Diabetes mellitus   Blood pressure 111/58, pulse 84, temperature 98.7 F (37.1 C), temperature source Oral, resp. rate 20, height 5\' 4"  (1.626 m), weight 114.5 kg (252 lb 6.8 oz), SpO2 95.00%. Physical Exam  Constitutional: She is oriented to person, place, and time. She appears well-developed and well-nourished.  HENT:  Head: Normocephalic and atraumatic.  Eyes: Conjunctivae and EOM are normal. Pupils are equal, round, and reactive to light.  Neck: Normal range of motion. Neck supple.  Cardiovascular: Intact distal pulses.   Respiratory: Effort normal.  GI: Soft.  Musculoskeletal: She exhibits edema (Left lower leg with some edema, less so on right.) and tenderness (Left lower leg tender with erythema mid to distal lower leg.  Area of yellowish color and discharge anterior lower leg, at junction of distal third and mid third with more redness here and mroe tenderness.  ).       Legs: Neurological: She is alert and oriented to person, place, and time. She has normal reflexes.  Skin: Skin is warm and dry. There is erythema.  Psychiatric: She has a normal mood and affect. Her behavior is normal. Judgment and thought content normal.    Assessment/Plan: Diffuse cellulitis of the left lower leg.  No definitive area of fluctuance or specific area to do an I & D.  Recommend continue IV antibiotics, elevate, limit  weight bearing for now.  Continue to observe.  Amit Leece 06/06/2013, 2:07 PM

## 2013-06-06 NOTE — Progress Notes (Signed)
UR Chart Review Completed  

## 2013-06-07 DIAGNOSIS — N179 Acute kidney failure, unspecified: Secondary | ICD-10-CM | POA: Diagnosis present

## 2013-06-07 DIAGNOSIS — E119 Type 2 diabetes mellitus without complications: Secondary | ICD-10-CM

## 2013-06-07 LAB — GLUCOSE, CAPILLARY
Glucose-Capillary: 207 mg/dL — ABNORMAL HIGH (ref 70–99)
Glucose-Capillary: 181 mg/dL — ABNORMAL HIGH (ref 70–99)
Glucose-Capillary: 107 mg/dL — ABNORMAL HIGH (ref 70–99)

## 2013-06-07 LAB — PROTIME-INR
INR: 1.96 — ABNORMAL HIGH (ref 0.00–1.49)
Prothrombin Time: 21.7 seconds — ABNORMAL HIGH (ref 11.6–15.2)

## 2013-06-07 LAB — CBC
HCT: 30.9 % — ABNORMAL LOW (ref 36.0–46.0)
Hemoglobin: 9.7 g/dL — ABNORMAL LOW (ref 12.0–15.0)
MCH: 28 pg (ref 26.0–34.0)
MCHC: 31.4 g/dL (ref 30.0–36.0)
MCV: 89 fL (ref 78.0–100.0)
RBC: 3.47 MIL/uL — ABNORMAL LOW (ref 3.87–5.11)

## 2013-06-07 LAB — BASIC METABOLIC PANEL
CO2: 27 mEq/L (ref 19–32)
Chloride: 98 mEq/L (ref 96–112)
GFR calc non Af Amer: 18 mL/min — ABNORMAL LOW (ref >90)
Glucose, Bld: 130 mg/dL — ABNORMAL HIGH (ref 70–99)
Potassium: 3.9 mEq/L (ref 3.5–5.1)
Sodium: 137 mEq/L (ref 135–145)

## 2013-06-07 MED ORDER — WARFARIN SODIUM 2.5 MG PO TABS: 2.5000 mg | ORAL_TABLET | Freq: Once | ORAL | Status: DC

## 2013-06-07 MED ORDER — WARFARIN SODIUM 5 MG PO TABS
5.0000 mg | ORAL_TABLET | Freq: Once | ORAL | Status: AC
  Administered 2013-06-07: 5 mg via ORAL
  Filled 2013-06-07: qty 1

## 2013-06-07 NOTE — Progress Notes (Signed)
[facility] PROGRESS NOTE  [name] UJW:[id number] DOB: [birth year] DOA: 06/05/2013 PCP: Cassell Smiles., MD  Assessment/Plan: 1. Cellulitis -severe of L  leg  -continue IV Vanc/Zosyn  -get CT negative for abscess/osteo -elevate leg -cut down IVF  2. ARF on CKD  -baseline creatinine 1.6, gentle hydration  -hold enalapril   3. DM  -Hbaic 9.7, hold oral hypoglycemics  -SSI   4. P.Afib  -rate controlled, continue metoprolol  -resume coumadin  5. CAD/CABG  -stable, continue ASA/Metoprolol   DVt proph: on coumadin  Code Status: Full  Family Communication: none at bedside  Disposition Plan: inpatient    Consultants:  Ortho  HPI/Subjective: Still with a lot of leg pain  Objective: Filed Vitals:   06/07/13 0548  BP: 156/73  Pulse: 95  Temp: 98.3 F (36.8 C)  Resp: 20    Intake/Output Summary (Last 24 hours) at 06/07/13 1029 Last data filed at 06/07/13 5621  Gross per 24 hour  Intake 3427.5 ml  Output      0 ml  Net 3427.5 ml   Filed Weights   06/05/13 1739 06/05/13 2300 06/07/13 0839  Weight: 114.533 kg (252 lb 8 oz) 114.5 kg (252 lb 6.8 oz) 116.9 kg (257 lb 11.5 oz)    Exam: General: AAOx3, ill appearing HEENT: PERRLA, EOMI, oral mucosa dry Cardiovascular: S1-S2 regular rate rhythm no murmurs rubs or gallops Respiratory: Clear to auscultation bilaterally Abdomen: Soft obese nontender and nondistended positive bowel sounds Skin: Multiple areas of skin breakdown on the right lower extremity Musculoskeletal: Right lower leg with diffuse distal swelling, erythema and multiple areas of skin breakdown with purulent drainage, swelling extending to the forefoot, diminished distal dorsalis pedis pulsations   Data Reviewed: Basic Metabolic Panel:  Recent Labs Lab 06/05/13 1407 06/06/13 0600 06/07/13 0532  NA 132* 134* 137  K 3.9 3.8 3.9  CL 94* 97 98  CO2 26 27 27   GLUCOSE 258* 164* 130*  BUN 42* 46* 49*  CREATININE 1.97* 2.38* 2.43*   CALCIUM 9.1 8.9 9.0   Liver Function Tests:  Recent Labs Lab 06/05/13 1407  AST 20  ALT 17  ALKPHOS 87  BILITOT 0.5  PROT 7.0  ALBUMIN 2.7*   No results found for this basename: LIPASE, AMYLASE,  in the last 168 hours No results found for this basename: AMMONIA,  in the last 168 hours CBC:  Recent Labs Lab 06/05/13 1407 06/06/13 0600 06/07/13 0532  WBC 14.9* 8.8 8.4  NEUTROABS 12.5*  --   --   HGB 11.0* 9.8* 9.7*  HCT 34.5* 30.9* 30.9*  MCV 88.0 88.5 89.0  PLT 228 189 207   Cardiac Enzymes: No results found for this basename: CKTOTAL, CKMB, CKMBINDEX, TROPONINI,  in the last 168 hours BNP (last 3 results)  Recent Labs  11/19/12 1504  PROBNP 2466.0*   CBG:  Recent Labs Lab 06/05/13 2235 06/06/13 0811 06/06/13 2033  GLUCAP 235* 143* 165*    No results found for this or any previous visit (from the past 240 hour(s)).   Studies: Ct Tibia Fibula Left Wo Contrast  06/06/2013   CLINICAL DATA:  Left lower extremity pain and swelling.  EXAM: CT TIBIA FIBULA LEFT WITHOUT CONTRAST  TECHNIQUE: Standard CT imaging of the left lower extremity was performed with coronal and sagittal reformatted images without contrast.  COMPARISON:  Radiographs 08/01/2010.  FINDINGS: There is a a remote ununited medial tibial plateau fracture along with moderate knee joint degenerative changes. A knee joint effusion is  noted.  The tibia and fibula are otherwise intact. No fracture, bone lesion or destructive bony changes to suggest osteomyelitis.  There is diffuse subcutaneous soft tissue swelling/ edema and fluid which is fairly extensive. Findings suggest cellulitis. No gas is seen in the soft tissues and I do not see an obvious discrete drainable soft tissue abscess. No findings to suggest myofasciitis or pyomyositis. Extensive small vessel calcifications are noted.  IMPRESSION: Severe and diffuse cellulitic change without obvious drainable soft tissue abscess.  No CT findings for  myofasciitis or osteomyelitis.  Knee joint degenerative changes and remote ununited medial tibial plateau fracture.   Electronically Signed   By: Loralie Champagne M.D.   On: 06/06/2013 10:15    Scheduled Meds: . allopurinol  100 mg Oral q morning - 10a  . amiodarone  200 mg Oral q morning - 10a  . aspirin  81 mg Oral Daily  . citalopram  10 mg Oral Daily  . folic acid  1 mg Oral BID  . insulin aspart  0-15 Units Subcutaneous TID WC  . iron polysaccharides  150 mg Oral BID  . levothyroxine  125 mcg Oral Q breakfast  . magnesium oxide  400 mg Oral q morning - 10a  . metoprolol succinate  25 mg Oral QHS  . oxybutynin  5 mg Oral BID  . pantoprazole  40 mg Oral q morning - 10a  . piperacillin-tazobactam (ZOSYN)  IV  3.375 g Intravenous Q8H  . vancomycin  1,500 mg Intravenous Q48H  . warfarin  5 mg Oral Once  . Warfarin - Pharmacist Dosing Inpatient   Does not apply Q24H   Continuous Infusions: . sodium chloride 100 mL/hr at 06/07/13 0111    Principal Problem:   Cellulitis and abscess of leg Active Problems:   Anemia   Atrial fibrillation   Type II or unspecified type diabetes mellitus without mention of complication, not stated as uncontrolled   Chronic kidney disease (CKD), stage III (moderate)    Time spent:    [facility]  [facility] Pager [phone]. If 7PM-7AM, please contact night-coverage at [email or web address], password [facility] 06/07/2013, 10:29 AM  LOS: 2 days

## 2013-06-07 NOTE — Progress Notes (Signed)
ANTICOAGULATION CONSULT NOTE - Initial Consult  Pharmacy Consult for Coumadin Indication: atrial fibrillation  No Known Allergies  Patient Measurements: Height: 5\' 4"  (162.6 cm) Weight: 257 lb 11.5 oz (116.9 kg) IBW/kg (Calculated) : 54.7  Vital Signs: Temp: 98.3 F (36.8 C) (11/08 0548) Temp src: Oral (11/08 0548) BP: 156/73 mmHg (11/08 0548) Pulse Rate: 95 (11/08 0548)  Labs:  Recent Labs  06/05/13 1407 06/06/13 0600 06/07/13 0532  HGB 11.0* 9.8* 9.7*  HCT 34.5* 30.9* 30.9*  PLT 228 189 207  LABPROT 22.1* 23.2* 21.7*  INR 2.00* 2.14* 1.96*  CREATININE 1.97* 2.38* 2.43*   Estimated Creatinine Clearance: 24 ml/min (by C-G formula based on Cr of 2.43).  Medical History: Past Medical History  Diagnosis Date  . Anemia 2008  . DM (diabetes mellitus) 1999  . Cataract     BIL  . Atrial fibrillation   . CAD (coronary artery disease)   . CRI (chronic renal insufficiency) 2008  . Hypertension   . Hyperlipemia   . GERD (gastroesophageal reflux disease)   . Arthritis    Medications:  Prescriptions prior to admission  Medication Sig Dispense Refill  . allopurinol (ZYLOPRIM) 100 MG tablet Take 100 mg by mouth every morning.       Marland Kitchen amiodarone (PACERONE) 200 MG tablet Take 200 mg by mouth every morning.       Marland Kitchen amoxicillin-clavulanate (AUGMENTIN) 875-125 MG per tablet Take 1 tablet by mouth 2 (two) times daily. Started on 05/28/13      . aspirin 81 MG tablet Take 162 mg by mouth daily.       . citalopram (CELEXA) 10 MG tablet Take 10 mg by mouth every morning.       . enalapril (VASOTEC) 10 MG tablet Take 10 mg by mouth every morning.       . folic acid (FOLVITE) 1 MG tablet Take 1 mg by mouth 2 (two) times daily.      . furosemide (LASIX) 80 MG tablet Take 1 tablet (80 mg total) by mouth 2 (two) times daily.  60 tablet  0  . GLIPIZIDE XL 10 MG 24 hr tablet Take 10 mg by mouth every morning.       . iron polysaccharides (NIFEREX) 150 MG capsule Take 150 mg by mouth 2  (two) times daily.      Marland Kitchen levothyroxine (SYNTHROID, LEVOTHROID) 125 MCG tablet Take 125 mcg by mouth every morning.       . magnesium oxide (MAG-OX) 400 MG tablet Take 400 mg by mouth every morning.       . metoprolol succinate (TOPROL-XL) 25 MG 24 hr tablet Take 1 tablet by mouth at bedtime.      Marland Kitchen oxybutynin (DITROPAN) 5 MG tablet Take 5 mg by mouth 2 (two) times daily.       . pantoprazole (PROTONIX) 40 MG tablet Take 40 mg by mouth every morning.       . potassium chloride SA (K-DUR,KLOR-CON) 20 MEQ tablet Take 1 tablet (20 mEq total) by mouth 2 (two) times daily.  60 tablet  0  . pravastatin (PRAVACHOL) 40 MG tablet Take 40 mg by mouth daily.       . traMADol (ULTRAM) 50 MG tablet Take 50 mg by mouth every 4 (four) hours as needed for pain.      Marland Kitchen warfarin (COUMADIN) 5 MG tablet Take 2.5-5 mg by mouth daily. Patient takes 1 tablet(5mg )on Tues.and Thurs. And 1/2 tablet(2.5mg )all other days  Assessment: 79yo morbidly obese female who was on Coumadin PTA for h/o afib.  Home dose listed above.  INR is slightly below goal. Goal of Therapy:  INR 2-3 Monitor platelets by anticoagulation protocol: Yes   Plan:  Coumadin 5mg  po today x 1 INR daily  Karenna Romanoff A 06/07/2013,10:24 AM

## 2013-06-07 NOTE — Progress Notes (Signed)
ANTIBIOTIC CONSULT NOTE   Pharmacy Consult for Vancomycin & Zosyn Indication: cellulitis  No Known Allergies  Patient Measurements: Height: 5\' 4"  (162.6 cm) Weight: 257 lb 11.5 oz (116.9 kg) IBW/kg (Calculated) : 54.7  Vital Signs: Temp: 98.3 F (36.8 C) (11/08 0548) Temp src: Oral (11/08 0548) BP: 156/73 mmHg (11/08 0548) Pulse Rate: 95 (11/08 0548) Intake/Output from previous day: 11/07 0701 - 11/08 0700 In: 3427.5 [I.V.:3177.5; IV Piggyback:250] Out: -  Intake/Output from this shift:    Labs:  Recent Labs  06/05/13 1407 06/06/13 0600 06/07/13 0532  WBC 14.9* 8.8 8.4  HGB 11.0* 9.8* 9.7*  PLT 228 189 207  CREATININE 1.97* 2.38* 2.43*   Estimated Creatinine Clearance: 24 ml/min (by C-G formula based on Cr of 2.43). No results found for this basename: VANCOTROUGH, VANCOPEAK, VANCORANDOM, GENTTROUGH, GENTPEAK, GENTRANDOM, TOBRATROUGH, TOBRAPEAK, TOBRARND, AMIKACINPEAK, AMIKACINTROU, AMIKACIN,  in the last 72 hours   Microbiology: No results found for this or any previous visit (from the past 720 hour(s)).  Medical History: Past Medical History  Diagnosis Date  . Anemia 2008  . DM (diabetes mellitus) 1999  . Cataract     BIL  . Atrial fibrillation   . CAD (coronary artery disease)   . CRI (chronic renal insufficiency) 2008  . Hypertension   . Hyperlipemia   . GERD (gastroesophageal reflux disease)   . Arthritis    Medications:  Scheduled:  . allopurinol  100 mg Oral q morning - 10a  . amiodarone  200 mg Oral q morning - 10a  . aspirin  81 mg Oral Daily  . citalopram  10 mg Oral Daily  . folic acid  1 mg Oral BID  . insulin aspart  0-15 Units Subcutaneous TID WC  . iron polysaccharides  150 mg Oral BID  . levothyroxine  125 mcg Oral Q breakfast  . magnesium oxide  400 mg Oral q morning - 10a  . metoprolol succinate  25 mg Oral QHS  . oxybutynin  5 mg Oral BID  . pantoprazole  40 mg Oral q morning - 10a  . piperacillin-tazobactam (ZOSYN)  IV  3.375  g Intravenous Q8H  . vancomycin  1,500 mg Intravenous Q48H  . warfarin  2.5 mg Oral Once  . Warfarin - Pharmacist Dosing Inpatient   Does not apply Q24H   Assessment: 79 yo obese F with hx DM admitted with RLE cellulitis. She has completed outpatient course of Doxycycline ~ 3 weeks ago and is currently on Augmentin with worsening symptoms.  Admit for IV antibiotics and r/o osteo, abscess.  Patient is noted to have chronic renal insufficiency. Normalized CrCl ~ 20-25 ml/min.  Estimated Creatinine Clearance: 24 ml/min (by C-G formula based on Cr of 2.43).  Vancomycin 11/6>> Zosyn 11/6>>  Goal of Therapy:  Vancomycin trough 15-75mcg/ml until osteo ruled out  Plan:  Zosyn 3.375gm IV Q8h to be infused over 4hrs (for ClCr > 20) Vancomycin 1500mg  IV q48h Check Vancomycin trough at steady state Monitor renal function and cx data   Valrie Hart A 06/07/2013,10:21 AM

## 2013-06-08 DIAGNOSIS — N179 Acute kidney failure, unspecified: Secondary | ICD-10-CM

## 2013-06-08 LAB — BASIC METABOLIC PANEL
Calcium: 9.2 mg/dL (ref 8.4–10.5)
Creatinine, Ser: 2.21 mg/dL — ABNORMAL HIGH (ref 0.50–1.10)
GFR calc Af Amer: 23 mL/min — ABNORMAL LOW (ref >90)
GFR calc non Af Amer: 20 mL/min — ABNORMAL LOW (ref >90)
Potassium: 4.3 mEq/L (ref 3.5–5.1)
Sodium: 136 mEq/L (ref 135–145)

## 2013-06-08 LAB — GLUCOSE, CAPILLARY
Glucose-Capillary: 225 mg/dL — ABNORMAL HIGH (ref 70–99)
Glucose-Capillary: 171 mg/dL — ABNORMAL HIGH (ref 70–99)
Glucose-Capillary: 147 mg/dL — ABNORMAL HIGH (ref 70–99)

## 2013-06-08 LAB — PROTIME-INR: Prothrombin Time: 20.2 seconds — ABNORMAL HIGH (ref 11.6–15.2)

## 2013-06-08 MED ORDER — ACETAMINOPHEN-CODEINE #3 300-30 MG PO TABS: 1.0000 | ORAL_TABLET | Freq: Four times a day (QID) | ORAL | Status: DC | PRN

## 2013-06-08 MED ORDER — WARFARIN SODIUM 5 MG PO TABS
5.0000 mg | ORAL_TABLET | Freq: Once | ORAL | Status: AC
  Administered 2013-06-08: 5 mg via ORAL
  Filled 2013-06-08: qty 1

## 2013-06-08 NOTE — Progress Notes (Signed)
[facility] PROGRESS NOTE  [name] WUJ:[id number] DOB: 11-29-1932 DOA: 06/05/2013 PCP: Cassell Smiles., MD  Assessment/Plan: 1. Cellulitis -severe of L  leg  -continue IV Vanc/Zosyn  -starting to improve -get CT negative for abscess/osteo -elevate leg -stop IVF  2. ARF on CKD  -baseline creatinine 1.6, gentle hydration  -hold enalapril   3. DM  -Hbaic 9.7, hold oral hypoglycemics  -SSI   4. P.Afib  -rate controlled, continue metoprolol  -resume coumadin  5. CAD/CABG  -stable, continue ASA/Metoprolol   DVt proph: on coumadin  Code Status: Full  Family Communication: none at bedside  Disposition Plan: inpatient    Consultants:  Ortho  HPI/Subjective: Pains starting to improve  Objective: Filed Vitals:   06/08/13 0638  BP: 154/56  Pulse: 86  Temp: 98.6 F (37 C)  Resp: 20   No intake or output data in the 24 hours ending 06/08/13 1115 Filed Weights   06/05/13 1739 06/05/13 2300 06/07/13 0839  Weight: 114.533 kg (252 lb 8 oz) 114.5 kg (252 lb 6.8 oz) 116.9 kg (257 lb 11.5 oz)    Exam: General: AAOx3, ill appearing HEENT: PERRLA, EOMI, oral mucosa dry Cardiovascular: S1-S2 regular rate rhythm no murmurs rubs or gallops Respiratory: Clear to auscultation bilaterally Abdomen: Soft obese nontender and nondistended positive bowel sounds Skin: Multiple areas of skin breakdown on the right lower extremity Musculoskeletal: Right lower leg with diffuse distal swelling, erythema and multiple areas of skin breakdown with purulent drainage, swelling extending to the forefoot, diminished distal dorsalis pedis pulsations   Data Reviewed: Basic Metabolic Panel:  Recent Labs Lab 06/05/13 1407 06/06/13 0600 06/07/13 0532 06/08/13 0722  NA 132* 134* 137 136  K 3.9 3.8 3.9 4.3  CL 94* 97 98 100  CO2 26 27 27 25   GLUCOSE 258* 164* 130* 128*  BUN 42* 46* 49* 43*  CREATININE 1.97* 2.38* 2.43* 2.21*  CALCIUM 9.1 8.9 9.0 9.2   Liver Function  Tests:  Recent Labs Lab 06/05/13 1407  AST 20  ALT 17  ALKPHOS 87  BILITOT 0.5  PROT 7.0  ALBUMIN 2.7*   No results found for this basename: LIPASE, AMYLASE,  in the last 168 hours No results found for this basename: AMMONIA,  in the last 168 hours CBC:  Recent Labs Lab 06/05/13 1407 06/06/13 0600 06/07/13 0532  WBC 14.9* 8.8 8.4  NEUTROABS 12.5*  --   --   HGB 11.0* 9.8* 9.7*  HCT 34.5* 30.9* 30.9*  MCV 88.0 88.5 89.0  PLT 228 189 207   Cardiac Enzymes: No results found for this basename: CKTOTAL, CKMB, CKMBINDEX, TROPONINI,  in the last 168 hours BNP (last 3 results)  Recent Labs  11/19/12 1504  PROBNP 2466.0*   CBG:  Recent Labs Lab 06/07/13 0803 06/07/13 1238 06/07/13 1705 06/07/13 2144 06/08/13 0715  GLUCAP 124* 184* 225* 107* 121*    No results found for this or any previous visit (from the past 240 hour(s)).   Studies: No results found.  Scheduled Meds: . allopurinol  100 mg Oral q morning - 10a  . amiodarone  200 mg Oral q morning - 10a  . aspirin  81 mg Oral Daily  . citalopram  10 mg Oral Daily  . folic acid  1 mg Oral BID  . insulin aspart  0-15 Units Subcutaneous TID WC  . iron polysaccharides  150 mg Oral BID  . levothyroxine  125 mcg Oral Q breakfast  . magnesium oxide  400 mg Oral  q morning - 10a  . metoprolol succinate  25 mg Oral QHS  . oxybutynin  5 mg Oral BID  . pantoprazole  40 mg Oral q morning - 10a  . piperacillin-tazobactam (ZOSYN)  IV  3.375 g Intravenous Q8H  . vancomycin  1,500 mg Intravenous Q48H  . warfarin  5 mg Oral Once  . Warfarin - Pharmacist Dosing Inpatient   Does not apply Q24H   Continuous Infusions:    Principal Problem:   Cellulitis and abscess of leg Active Problems:   Anemia   Atrial fibrillation   Type II or unspecified type diabetes mellitus without mention of complication, not stated as uncontrolled   Chronic kidney disease (CKD), stage III (moderate)   ARF (acute renal failure)    Time  spent:    [facility]  [facility] Pager [phone]. If 7PM-7AM, please contact night-coverage at [email or web address], password [facility] 06/08/2013, 11:15 AM  LOS: 3 days

## 2013-06-08 NOTE — Progress Notes (Signed)
Subjective: My leg is the same   Objective: Vital signs in last 24 hours: Temp:  [98.6 F (37 C)-98.9 F (37.2 C)] 98.6 F (37 C) (11/09 1610) Pulse Rate:  [79-96] 86 (11/09 0638) Resp:  [17-20] 20 (11/09 9604) BP: (131-154)/(54-78) 154/56 mmHg (11/09 0638) SpO2:  [92 %-96 %] 95 % (11/09 5409)  Intake/Output from previous day:   Intake/Output this shift:     Recent Labs  06/05/13 1407 06/06/13 0600 06/07/13 0532  HGB 11.0* 9.8* 9.7*    Recent Labs  06/06/13 0600 06/07/13 0532  WBC 8.8 8.4  RBC 3.49* 3.47*  HCT 30.9* 30.9*  PLT 189 207    Recent Labs  06/07/13 0532 06/08/13 0722  NA 137 136  K 3.9 4.3  CL 98 100  CO2 27 25  BUN 49* 43*  CREATININE 2.43* 2.21*  GLUCOSE 130* 128*  CALCIUM 9.0 9.2    Recent Labs  06/07/13 0532 06/08/13 0722  INR 1.96* 1.78*    Neurologically intact Neurovascular intact Sensation intact distally Intact pulses distally Dorsiflexion/Plantar flexion intact The area of the anterior tibia at the junction of the mid third and lower third still has some breakdown.  I will order PT pulse lavage to the area.  Continue antibiotics.  She has less redness present.  Assessment/Plan: Cellulitis of the left lower leg.  Begin PT pulse lavage.   Kele Withem 06/08/2013, 10:22 AM

## 2013-06-08 NOTE — Progress Notes (Signed)
ANTICOAGULATION CONSULT NOTE   Pharmacy Consult for Coumadin Indication: atrial fibrillation  No Known Allergies  Patient Measurements: Height: 5\' 4"  (162.6 cm) Weight: 257 lb 11.5 oz (116.9 kg) IBW/kg (Calculated) : 54.7  Vital Signs: Temp: 98.6 F (37 C) (11/09 4010) Temp src: Oral (11/09 2725) BP: 154/56 mmHg (11/09 3664) Pulse Rate: 86 (11/09 0638)  Labs:  Recent Labs  06/05/13 1407 06/06/13 0600 06/07/13 0532 06/08/13 0722  HGB 11.0* 9.8* 9.7*  --   HCT 34.5* 30.9* 30.9*  --   PLT 228 189 207  --   LABPROT 22.1* 23.2* 21.7* 20.2*  INR 2.00* 2.14* 1.96* 1.78*  CREATININE 1.97* 2.38* 2.43* 2.21*   Estimated Creatinine Clearance: 26.4 ml/min (by C-G formula based on Cr of 2.21).  Medical History: Past Medical History  Diagnosis Date  . Anemia 2008  . DM (diabetes mellitus) 1999  . Cataract     BIL  . Atrial fibrillation   . CAD (coronary artery disease)   . CRI (chronic renal insufficiency) 2008  . Hypertension   . Hyperlipemia   . GERD (gastroesophageal reflux disease)   . Arthritis    Medications:  Prescriptions prior to admission  Medication Sig Dispense Refill  . allopurinol (ZYLOPRIM) 100 MG tablet Take 100 mg by mouth every morning.       Marland Kitchen amiodarone (PACERONE) 200 MG tablet Take 200 mg by mouth every morning.       Marland Kitchen amoxicillin-clavulanate (AUGMENTIN) 875-125 MG per tablet Take 1 tablet by mouth 2 (two) times daily. Started on 05/28/13      . aspirin 81 MG tablet Take 162 mg by mouth daily.       . citalopram (CELEXA) 10 MG tablet Take 10 mg by mouth every morning.       . enalapril (VASOTEC) 10 MG tablet Take 10 mg by mouth every morning.       . folic acid (FOLVITE) 1 MG tablet Take 1 mg by mouth 2 (two) times daily.      . furosemide (LASIX) 80 MG tablet Take 1 tablet (80 mg total) by mouth 2 (two) times daily.  60 tablet  0  . GLIPIZIDE XL 10 MG 24 hr tablet Take 10 mg by mouth every morning.       . iron polysaccharides (NIFEREX) 150 MG  capsule Take 150 mg by mouth 2 (two) times daily.      Marland Kitchen levothyroxine (SYNTHROID, LEVOTHROID) 125 MCG tablet Take 125 mcg by mouth every morning.       . magnesium oxide (MAG-OX) 400 MG tablet Take 400 mg by mouth every morning.       . metoprolol succinate (TOPROL-XL) 25 MG 24 hr tablet Take 1 tablet by mouth at bedtime.      Marland Kitchen oxybutynin (DITROPAN) 5 MG tablet Take 5 mg by mouth 2 (two) times daily.       . pantoprazole (PROTONIX) 40 MG tablet Take 40 mg by mouth every morning.       . potassium chloride SA (K-DUR,KLOR-CON) 20 MEQ tablet Take 1 tablet (20 mEq total) by mouth 2 (two) times daily.  60 tablet  0  . pravastatin (PRAVACHOL) 40 MG tablet Take 40 mg by mouth daily.       . traMADol (ULTRAM) 50 MG tablet Take 50 mg by mouth every 4 (four) hours as needed for pain.      Marland Kitchen warfarin (COUMADIN) 5 MG tablet Take 2.5-5 mg by mouth daily. Patient takes 1 tablet(5mg )on  Tues.and Thurs. And 1/2 tablet(2.5mg )all other days       Assessment: 79yo morbidly obese female who was on Coumadin PTA for h/o afib.  Home dose listed above.  INR is below goal. Goal of Therapy:  INR 2-3 Monitor platelets by anticoagulation protocol: Yes   Plan:  Coumadin 5mg  po today x 1 INR daily  [name] A 06/08/2013,11:11 AM

## 2013-06-08 NOTE — Consult Note (Signed)
WOC wound consult note Reason for Consult: Cellulitis with ulceration on left lateral LE (full thickness).  Bilateral sDTI (suspected deep tissue injury) ulcerations on the lateral heels from recent and event related immobility. Wound type:Infectious and pressure Pressure Ulcer POA: Yes Measurement: Area of erythema on left LE is circumferential with somewhat masked hemosiderin staining.  There is an anterior and lateral ulceration measuring 4cm x 2.5cm with a yellow base and mild fluctuance.  LE is very painful to the touch and gentle manipulation for assessment is accompanied by anticipatory response of discomfort. Bilateral heels present with purple-blue areas of sDTI on the lateral heels (L>R).  Left measures 2.5cm x 4cm, Right measures 2cm x 3cm. Right pretibial area with much smaller area of hemosiderin staining (12 x 10 cm) but no ulceration. Wound bed: As described above. Drainage (amount, consistency, odor): Small amount of serous exudate from Left LE.  None from heels or right LE. No odor. Periwound:Edema bilaterally, L>R. Erythema, L>R. Dressing procedure/placement/frequency: I will provide pressure redistribution boots (Prevalon) to the bilateral heels to "float" them and to hopefully stop the pressure related injury to the heels.  Suspect these will take a long time to resolve (as much as several months) despite topical  treatment and elevation/floating and hopefully they will resolve without ulceration.  Patient taught about need for pressure redistribution and she agrees stating that it will be easy to do (elevate) as it is quite painful to have them (her heels) rest on bed.  I note that PT has been consulted for hydrotherapy to the left lateral LE ulceration and I have recommended conservative topical care (saline-moistened gauze) twice daily to support that therapy along with mild compression contributed by an ACE wrap.  The moisture retentive environment is being augmented by the systemic  antibiotic therapy and it is expected to resolve this acute episode.  Patient may require assistance of [facility] for monitoring of the bilateral heel sDTIs, or perhaps this can be followed by her PCP post discharge.  I will defer that aspect of the POC to her hospitalist. WOC nursing team will not follow, but will remain available to this patient, the nursing and medical team.  Please re-consult if needed. Thanks, Ladona Mow, MSN, RN, GNP, [place], CWON-AP [phone])

## 2013-06-09 LAB — CBC
HCT: 31.1 % — ABNORMAL LOW (ref 36.0–46.0)
HCT: 31 % — ABNORMAL LOW (ref 36.0–46.0)
Hemoglobin: 9.7 g/dL — ABNORMAL LOW (ref 12.0–15.0)
Hemoglobin: 9.8 g/dL — ABNORMAL LOW (ref 12.0–15.0)
MCHC: 31.6 g/dL (ref 30.0–36.0)
MCV: 89.9 fL (ref 78.0–100.0)
RBC: 3.45 MIL/uL — ABNORMAL LOW (ref 3.87–5.11)
RDW: 13.9 % (ref 11.5–15.5)
RDW: 14.1 % (ref 11.5–15.5)
WBC: 11.1 10*3/uL — ABNORMAL HIGH (ref 4.0–10.5)
WBC: 12.7 10*3/uL — ABNORMAL HIGH (ref 4.0–10.5)

## 2013-06-09 LAB — GLUCOSE, CAPILLARY
Glucose-Capillary: 156 mg/dL — ABNORMAL HIGH (ref 70–99)
Glucose-Capillary: 215 mg/dL — ABNORMAL HIGH (ref 70–99)

## 2013-06-09 LAB — BASIC METABOLIC PANEL
CO2: 26 mEq/L (ref 19–32)
Creatinine, Ser: 2.12 mg/dL — ABNORMAL HIGH (ref 0.50–1.10)
Glucose, Bld: 163 mg/dL — ABNORMAL HIGH (ref 70–99)
Potassium: 4.2 mEq/L (ref 3.5–5.1)
Sodium: 137 mEq/L (ref 135–145)

## 2013-06-09 LAB — PROTIME-INR: INR: 1.85 — ABNORMAL HIGH (ref 0.00–1.49)

## 2013-06-09 MED ORDER — FUROSEMIDE 10 MG/ML IJ SOLN
60.0000 mg | Freq: Every day | INTRAMUSCULAR | Status: DC
  Administered 2013-06-09 – 2013-06-11 (×3): 60 mg via INTRAVENOUS
  Filled 2013-06-09 (×3): qty 6

## 2013-06-09 MED ORDER — WARFARIN SODIUM 5 MG PO TABS
5.0000 mg | ORAL_TABLET | Freq: Once | ORAL | Status: AC
  Administered 2013-06-09: 5 mg via ORAL
  Filled 2013-06-09: qty 1

## 2013-06-09 NOTE — Progress Notes (Signed)
[facility] PROGRESS NOTE  [name] Streeter YQM:[id number] DOB: Oct 06, 1932 DOA: 06/05/2013 PCP: Cassell Smiles., MD  Assessment/Plan: 1. Cellulitis -severe of L  leg  -slowly improving -continue IV Vanc/Zosyn  -CT tib-fib negative for abscess/osteo -elevate leg -low dose lasix today -PT pulse lavage started -appreciate Dr.Keelings input  2. ARF on CKD  -baseline creatinine 1.6, gentle hydration  -hold enalapril   3. DM  -Hbaic 9.7, hold oral hypoglycemics  -SSI   4. P.Afib  -rate controlled, continue metoprolol  -resume coumadin  5. CAD/CABG  -stable, continue ASA/Metoprolol   6. ACute on chronic diastolic CHF -resume lasix, iv today and monitor clinically  DVt proph: on coumadin  Code Status: Full  Family Communication: none at bedside  Disposition Plan: SNF in 1-2days  Consultants:  Ortho  HPI/Subjective: Pains starting to improve, feels tired  Objective: Filed Vitals:   06/09/13 0500  BP: 169/60  Pulse: 106  Temp: 98.3 F (36.8 C)  Resp: 20    Intake/Output Summary (Last 24 hours) at 06/09/13 1211 Last data filed at 06/09/13 0330  Gross per 24 hour  Intake      0 ml  Output    600 ml  Net   -600 ml   Filed Weights   06/05/13 1739 06/05/13 2300 06/07/13 0839  Weight: 114.533 kg (252 lb 8 oz) 114.5 kg (252 lb 6.8 oz) 116.9 kg (257 lb 11.5 oz)    Exam: General: AAOx3, ill appearing  HEENT: PERRLA, EOMI, oral mucosa dry  Cardiovascular: S1-S2 regular rate rhythm no murmurs rubs or gallops  Respiratory: fine basilar crackles  Abdomen: Soft obese nontender and nondistended positive bowel sounds  Skin: Multiple areas of skin breakdown on the right lower extremity  Musculoskeletal: Right lower leg with diffuse distal swelling, erythema and multiple areas of skin breakdown with purulent drainage, swelling extending to the forefoot, diminished distal dorsalis pedis pulsations, starting to improve   Data Reviewed: Basic Metabolic  Panel:  Recent Labs Lab 06/05/13 1407 06/06/13 0600 06/07/13 0532 06/08/13 0722 06/09/13 0542  NA 132* 134* 137 136 137  K 3.9 3.8 3.9 4.3 4.2  CL 94* 97 98 100 99  CO2 26 27 27 25 26   GLUCOSE 258* 164* 130* 128* 163*  BUN 42* 46* 49* 43* 40*  CREATININE 1.97* 2.38* 2.43* 2.21* 2.12*  CALCIUM 9.1 8.9 9.0 9.2 9.5   Liver Function Tests:  Recent Labs Lab 06/05/13 1407  AST 20  ALT 17  ALKPHOS 87  BILITOT 0.5  PROT 7.0  ALBUMIN 2.7*   No results found for this basename: LIPASE, AMYLASE,  in the last 168 hours No results found for this basename: AMMONIA,  in the last 168 hours CBC:  Recent Labs Lab 06/05/13 1407 06/06/13 0600 06/07/13 0532 06/09/13 0542  WBC 14.9* 8.8 8.4 11.1*  NEUTROABS 12.5*  --   --   --   HGB 11.0* 9.8* 9.7* 9.7*  HCT 34.5* 30.9* 30.9* 31.1*  MCV 88.0 88.5 89.0 89.1  PLT 228 189 207 225   Cardiac Enzymes: No results found for this basename: CKTOTAL, CKMB, CKMBINDEX, TROPONINI,  in the last 168 hours BNP (last 3 results)  Recent Labs  11/19/12 1504  PROBNP 2466.0*   CBG:  Recent Labs Lab 06/08/13 0715 06/08/13 1115 06/08/13 1640 06/08/13 2113 06/09/13 0757  GLUCAP 121* 169* 171* 147* 156*    No results found for this or any previous visit (from the past 240 hour(s)).   Studies: No results found.  Scheduled Meds: . allopurinol  100 mg Oral q morning - 10a  . amiodarone  200 mg Oral q morning - 10a  . aspirin  81 mg Oral Daily  . citalopram  10 mg Oral Daily  . folic acid  1 mg Oral BID  . furosemide  60 mg Intravenous Daily  . insulin aspart  0-15 Units Subcutaneous TID WC  . iron polysaccharides  150 mg Oral BID  . levothyroxine  125 mcg Oral Q breakfast  . magnesium oxide  400 mg Oral q morning - 10a  . metoprolol succinate  25 mg Oral QHS  . oxybutynin  5 mg Oral BID  . pantoprazole  40 mg Oral q morning - 10a  . piperacillin-tazobactam (ZOSYN)  IV  3.375 g Intravenous Q8H  . vancomycin  1,500 mg Intravenous  Q48H  . warfarin  5 mg Oral Once  . Warfarin - Pharmacist Dosing Inpatient   Does not apply Q24H   Continuous Infusions:    Principal Problem:   Cellulitis and abscess of leg Active Problems:   Anemia   Atrial fibrillation   Type II or unspecified type diabetes mellitus without mention of complication, not stated as uncontrolled   Chronic kidney disease (CKD), stage III (moderate)   ARF (acute renal failure)    Time spent:    [facility]  [facility] Pager [phone]. If 7PM-7AM, please contact night-coverage at [email or web address], password [facility] 06/09/2013, 12:11 PM  LOS: 4 days

## 2013-06-09 NOTE — Clinical Social Work Psychosocial (Signed)
     Clinical Social Work Department BRIEF PSYCHOSOCIAL ASSESSMENT 06/09/2013  Patient:  [name], [name]     Account Number:  [id number]     Admit date:  06/05/2013  Clinical Social Worker:  Santa Genera, CLINICAL SOCIAL WORKER  Date/Time:  06/09/2013 12:00 N  Referred by:  Physician  Date Referred:  06/09/2013 Referred for  SNF Placement   Other Referral:   Interview type:  Patient Other interview type:   Left VM for patient's sons listed on facesheet,    PSYCHOSOCIAL DATA Living Status:  FAMILY Admitted from facility:   Level of care:   Primary support name:  Link Snuffer and Amaal Dimartino Primary support relationship to patient:  CHILD, ADULT Degree of support available:   Patient lives w and is guardian for mentally handicapped 4 year old daughter.  Also has 2 sons in area who are helpful and involved.    CURRENT CONCERNS Current Concerns  Post-Acute Placement   Other Concerns:    SOCIAL WORK ASSESSMENT / PLAN CSW met w patient at bedside, patient very sleepy, had difficulty keeping eyes open.  Also spoke w son, Link Snuffer, by phone.  Patient is widow, cares for and is guardian for 77 year old mentally handicapped daughter at home.  Daughter is now being cared for by son in [place], son is on disability and terminally ill. Is having difficulty managing sister's behaviors.  In past hospitalization, when patient went into surgery both daughter and husband (now deceased, had dementia) were taken from hospital by sheriff's deputies and involuntarily committed as patient could not take care of them.    Patient says that she has help from daughters in law, asked that CSW call Leeroy Bock who is a Estate agent"  VM left for [facility] asking for return call.  Spoke w son, Link Snuffer, who confirmed that patient has been at [facility] and [facility] in past.  Does not want patien to return to [facility].  As one son lives in [place] and another son works in [place], [place], [place], [place] and [facility]  are all acceptable.    CSW will work w family and patient re placement.  Explained to patient and son about copays and need to seek bed offers.  Have faxed patient out and awaiting responses from facilities.  Family has not expressed a preference for a particular facility, patient was too tired to actively participate in decision making process at present.   Assessment/plan status:  Psychosocial Support/Ongoing Assessment of Needs Other assessment/ plan:   Information/referral to community resources:   [employer] list    PATIENTS/FAMILYS RESPONSE TO PLAN OF CARE: Patient wants placement at SNF, son concerned about mentally handicapped sister - advised son that he would need to work through [employer] of court re guardianship and PCP for possible placement in facility if needed.

## 2013-06-09 NOTE — Progress Notes (Signed)
ANTICOAGULATION CONSULT NOTE   Pharmacy Consult for Coumadin Indication: atrial fibrillation  No Known Allergies  Patient Measurements: Height: 5\' 4"  (162.6 cm) Weight: 257 lb 11.5 oz (116.9 kg) IBW/kg (Calculated) : 54.7  Vital Signs: Temp: 98.3 F (36.8 C) (11/10 0500) Temp src: Oral (11/10 0500) BP: 169/60 mmHg (11/10 0500) Pulse Rate: 106 (11/10 0500)  Labs:  Recent Labs  06/07/13 0532 06/08/13 0722 06/09/13 0542  HGB 9.7*  --  9.7*  HCT 30.9*  --  31.1*  PLT 207  --  225  LABPROT 21.7* 20.2* 20.8*  INR 1.96* 1.78* 1.85*  CREATININE 2.43* 2.21* 2.12*   Estimated Creatinine Clearance: 27.5 ml/min (by C-G formula based on Cr of 2.12).  Medical History: Past Medical History  Diagnosis Date  . Anemia 2008  . DM (diabetes mellitus) 1999  . Cataract     BIL  . Atrial fibrillation   . CAD (coronary artery disease)   . CRI (chronic renal insufficiency) 2008  . Hypertension   . Hyperlipemia   . GERD (gastroesophageal reflux disease)   . Arthritis    Medications:  Prescriptions prior to admission  Medication Sig Dispense Refill  . allopurinol (ZYLOPRIM) 100 MG tablet Take 100 mg by mouth every morning.       Marland Kitchen amiodarone (PACERONE) 200 MG tablet Take 200 mg by mouth every morning.       Marland Kitchen amoxicillin-clavulanate (AUGMENTIN) 875-125 MG per tablet Take 1 tablet by mouth 2 (two) times daily. Started on 05/28/13      . aspirin 81 MG tablet Take 162 mg by mouth daily.       . citalopram (CELEXA) 10 MG tablet Take 10 mg by mouth every morning.       . enalapril (VASOTEC) 10 MG tablet Take 10 mg by mouth every morning.       . folic acid (FOLVITE) 1 MG tablet Take 1 mg by mouth 2 (two) times daily.      . furosemide (LASIX) 80 MG tablet Take 1 tablet (80 mg total) by mouth 2 (two) times daily.  60 tablet  0  . GLIPIZIDE XL 10 MG 24 hr tablet Take 10 mg by mouth every morning.       . iron polysaccharides (NIFEREX) 150 MG capsule Take 150 mg by mouth 2 (two) times  daily.      Marland Kitchen levothyroxine (SYNTHROID, LEVOTHROID) 125 MCG tablet Take 125 mcg by mouth every morning.       . magnesium oxide (MAG-OX) 400 MG tablet Take 400 mg by mouth every morning.       . metoprolol succinate (TOPROL-XL) 25 MG 24 hr tablet Take 1 tablet by mouth at bedtime.      Marland Kitchen oxybutynin (DITROPAN) 5 MG tablet Take 5 mg by mouth 2 (two) times daily.       . pantoprazole (PROTONIX) 40 MG tablet Take 40 mg by mouth every morning.       . potassium chloride SA (K-DUR,KLOR-CON) 20 MEQ tablet Take 1 tablet (20 mEq total) by mouth 2 (two) times daily.  60 tablet  0  . pravastatin (PRAVACHOL) 40 MG tablet Take 40 mg by mouth daily.       . traMADol (ULTRAM) 50 MG tablet Take 50 mg by mouth every 4 (four) hours as needed for pain.      Marland Kitchen warfarin (COUMADIN) 5 MG tablet Take 2.5-5 mg by mouth daily. Patient takes 1 tablet(5mg )on Tues.and Thurs. And 1/2 tablet(2.5mg )all other days  Assessment: 79yo morbidly obese female who was on Coumadin PTA for h/o afib.  Home dose listed above.  INR is below goal but rising.  Pt is also on Amiodarone and aspirin.  Goal of Therapy:  INR 2-3 Monitor platelets by anticoagulation protocol: Yes   Plan:  Coumadin 5mg  po today x 1 INR daily  Shaunta Oncale A 06/09/2013,7:57 AM

## 2013-06-09 NOTE — Clinical Social Work Note (Signed)
Clinical Social Work Department BRIEF PSYCHOSOCIAL ASSESSMENT 06/09/2013  Patient:  [name], [name]     Account Number:  [id number]     Admit date:  06/05/2013  Clinical Social Worker:  Santa Genera, CLINICAL SOCIAL WORKER  Date/Time:  06/09/2013 12:00 N  Referred by:  Physician  Date Referred:  06/09/2013 Referred for  SNF Placement   Other Referral:   Interview type:  Patient Other interview type:   Left VM for patient's sons listed on facesheet,    PSYCHOSOCIAL DATA Living Status:  FAMILY Admitted from facility:   Level of care:   Primary support name:  [name] and [name] Primary support relationship to patient:  CHILD, ADULT Degree of support available:   Patient lives w and is guardian for mentally handicapped 21 year old daughter.  Also has 2 sons in area who are helpful and involved.    CURRENT CONCERNS Current Concerns  Post-Acute Placement   Other Concerns:    SOCIAL WORK ASSESSMENT / PLAN CSW met w patient at bedside, patient very sleepy, had difficulty keeping eyes open.  Also spoke w son, [name], by phone.  Patient is widow, cares for and is guardian for 77 year old mentally handicapped daughter at home.  Daughter is now being cared for by son in [place], son is on disability and terminally ill. Is having difficulty managing sister's behaviors.  In past hospitalization, when patient went into surgery both daughter and husband (now deceased, had dementia) were taken from hospital by sheriff's deputies and involuntarily committed as patient could not take care of them.    Patient says that she has help from daughters in law, asked that CSW call [name] who is a Estate agent"  VM left for [facility] asking for return call.  Spoke w son, [name], who confirmed that patient has been at [facility] and [facility] in past.  Does not want patien to return to [facility].  As one son lives in [place] and another son works in [place], [place], [place], [place] and [facility] are all  acceptable.    CSW will work w family and patient re placement.  Explained to patient and son about copays and need to seek bed offers.  Have faxed patient out and awaiting responses from facilities.  Family has not expressed a preference for a particular facility, patient was too tired to actively participate in decision making process at present.   Assessment/plan status:  Psychosocial Support/Ongoing Assessment of Needs Other assessment/ plan:   Information/referral to community resources:   [employer] list    PATIENT'S/FAMILY'S RESPONSE TO PLAN OF CARE: Patient wants placement at SNF, son concerned about mentally handicapped sister - advised son that he would need to work through [employer] of court re guardianship and PCP for possible placement in facility if needed.       Santa Genera, LCSW Clinical Social Worker [phone])

## 2013-06-09 NOTE — Evaluation (Signed)
Physical Therapy Evaluation Patient Details Name: [name] MRN: [id number] DOB: Jul 20, 1933 Today's Date: 06/09/2013 Time: 4098-1191 PT Time Calculation (min): 59 min  PT Assessment / Plan / Recommendation History of Present Illness  Pt is admitted for cellulitis and non healing wound of the LLE.  She lives with her daughter who is mentally challenged and is independent with all ADLs.  Her mobility is now severely compromised due to pain in both LEs and is unable to walk at all.  Clinical Impression   Pt was seen for wound care to the LLE as outlined by the [facility] nurse.  See wound care note for details.  Because of her inability to ambulate, she was also seen for  A functional evaluation and found to have generalized weakness and inability to walk due to pain in primarily the LLE.  She has a walker but states that she is unable to stand on her feet at all.  Because of this problem, she will need SNF in order to give her time to develop gait proficiency.    PT Assessment  Patient needs continued PT services    Follow Up Recommendations  SNF    Does the patient have the potential to tolerate intense rehabilitation      Barriers to Discharge Decreased caregiver support daughter is mentally challenged    Equipment Recommendations  None recommended by PT    Recommendations for Other Services     Frequency Min 3X/week    Precautions / Restrictions Precautions Precautions: Fall Restrictions Weight Bearing Restrictions: No   Pertinent Vitals/Pain       Mobility  Bed Mobility Bed Mobility: Not assessed Details for Bed Mobility Assistance: pt was up in chair at time of my arrival.  She states that she needed mod assist of nurse to stand and pivot to the chair.  She refused to even try to stand with me due to anticipation of severe pain upon standing.    Exercises     PT Diagnosis: Difficulty walking;Acute pain  PT Problem List: Decreased strength;Decreased activity  tolerance;Decreased mobility;Cardiopulmonary status limiting activity;Pain PT Treatment Interventions: Gait training;Functional mobility training;Therapeutic exercise     PT Goals(Current goals can be found in the care plan section) Acute Rehab PT Goals Patient Stated Goal: full healing of wound on leg which will relieve pain and allow for improved mobility PT Goal Formulation: With patient Time For Goal Achievement: 06/23/13 Potential to Achieve Goals: Good  Visit Information  Last PT Received On: 06/09/13 History of Present Illness: Pt is admitted for cellulitis and non healing wound of the LLE.  She lives with her daughter who is mentally challenged and is independent with all ADLs.  Her mobility is now severely compromised due to pain in both LEs and is unable to walk at all.       Prior Functioning  Home Living Family/patient expects to be discharged to:: Skilled nursing facility Prior Function Level of Independence: Independent Communication Communication: No difficulties    Cognition  Cognition Arousal/Alertness: Awake/alert Behavior During Therapy: WFL for tasks assessed/performed Overall Cognitive Status: Within Functional Limits for tasks assessed    Extremity/Trunk Assessment Lower Extremity Assessment Lower Extremity Assessment: Generalized weakness   Balance    End of Session PT - End of Session Activity Tolerance: Patient limited by pain Patient left: in chair;with call bell/phone within reach Nurse Communication: Mobility status  GP     Konrad Penta 06/09/2013, 12:08 PM

## 2013-06-09 NOTE — Progress Notes (Signed)
Physical Therapy Wound Treatment Patient Details  Name: [name] MRN: [id number] Date of Birth: 1932-11-10  Today's Date: 06/09/2013 Time: 4098-1191 Time Calculation (min): 59 min  Subjective  Subjective: II've done everything to heal my leg but nothing has helped Patient and Family Stated Goals: relief from pain in LEs Date of Onset: 05/19/13 Prior Treatments: applied alcohol, H2O2, wrapping with saline, MD placed on antibiotics  Pain Score: Pain Score: 5   Wound Assessment  Wound 06/05/13 Blister (Blood filled) Toe (Comment  which one) Right small dime sized blood blister at base of third toe on rt foot. blister intact. (Active)  Site / Wound Assessment Black;Red 06/09/2013  8:15 AM  Wound Length (cm) 1 cm 06/05/2013  5:47 PM  Wound Width (cm) 0.5 cm 06/05/2013  5:47 PM  Drainage Amount None 06/09/2013  8:15 AM     Wound 06/05/13 Blister (Blood filled) Toe (Comment  which one) Left fourth toe left foot, blood filled blister at base of toe nail. intact no drainage noted (Active)  Site / Wound Assessment Black;Red 06/09/2013  8:15 AM  Wound Length (cm) 0.2 cm 06/05/2013  5:47 PM  Wound Width (cm) 0.4 cm 06/05/2013  5:47 PM  Drainage Amount None 06/09/2013  8:15 AM  Dressing Type Adhesive bandage;Non adherent;Gauze (Comment) 06/08/2013  6:50 PM  Dressing Changed New 06/08/2013  6:50 PM     Wound 06/05/13 Other (Comment) Leg Left Left lower leg cellulitis with several open draining wounds, serous fluid noted (Active)  Site / Wound Assessment Yellow;Pink;Red 06/09/2013  8:15 AM  % Wound base Red or Granulating 50% 06/09/2013 11:43 AM  % Wound base Yellow 50% 06/09/2013 11:43 AM  Wound Length (cm) 4 cm 06/09/2013 11:43 AM  Wound Width (cm) 3 cm 06/09/2013 11:43 AM  Wound Depth (cm) 0.2 cm 06/09/2013 11:43 AM  Tunneling (cm) 0 06/09/2013 11:43 AM  Undermining (cm) 0 06/09/2013 11:43 AM  Drainage Amount Scant 06/09/2013 11:43 AM  Drainage Description Serous 06/09/2013 11:43 AM   Treatment Hydrotherapy (Pulse lavage) 06/09/2013 11:43 AM  Dressing Type Moist to moist 06/09/2013 11:43 AM  Dressing Changed Changed 06/09/2013 11:43 AM  Dressing Status Clean;Dry;Intact 06/09/2013  8:15 AM     Wound 06/08/13 Other (Comment) Heel Right;Left purple and non blanchable (Active)  Site / Wound Assessment Purple 06/09/2013  8:15 AM  % Wound base Other (Comment) 100% 06/09/2013  8:15 AM  Peri-wound Assessment Intact 06/09/2013  8:15 AM  Dressing Type Foam 06/09/2013  8:15 AM  Dressing Changed New 06/08/2013  6:50 PM  Dressing Status Clean;Dry;Intact 06/09/2013  8:15 AM       Wound Assessment and Plan  Wound Therapy - Assess/Plan/Recommendations Wound Therapy - Clinical Statement: Pt has a non healing stasis wound of the left LE, lower lateral calf.  There are multiple small dried areas of slough surrounding the wound which were debrided to find healed skin underneath, so all that remains is the one wound.  The wound was completely covered with yellow slough.    About 50% of the wound was debrided down to granular tissue following PL.  The wound was covered with saline soaked gauze, dry 4x4, ABD pad and  Kerlix.  The calf was then wrapped with and Ace bandage from toe to knee. Wound Therapy - Functional Problem List: Pt is c/o severe pain in both feet and calves, left worse than right and she is unable to ambulate because of it.  She has not walked at all in several days. Factors  Delaying/Impairing Wound Healing: Diabetes Mellitus;Multiple medical problems Hydrotherapy Plan: Debridement;Dressing change;Pulsatile lavage with suction Wound Therapy - Frequency: 6X / week Wound Therapy - Current Recommendations: Case manager/social work Wound Therapy - Follow Up Recommendations: Skilled nursing facility Wound Plan: PL with debridement followed by covering with moist gauze, 4x4, ABD pad, kerlix and Ace wrap.  Nursing will change dressing one other time.  Wound Therapy Goals- Improve  the function of patient's integumentary system by progressing the wound(s) through the phases of wound healing (inflammation - proliferation - remodeling) by: Increase Granulation Tissue to: 70% Increase Granulation Tissue - Progress: Goal set today Decrease Length/Width/Depth by (cm): 3.5 cm x 2.5 cm Decrease Length/Width/Depth - Progress: Goal set today Time For Goal Achievement: 7 days Wound Therapy - Potential for Goals: Good  Goals will be updated until maximal potential achieved or discharge criteria met.  Discharge criteria: when goals achieved, discharge from hospital, MD decision/surgical intervention, no progress towards goals, refusal/missing three consecutive treatments without notification or medical reason.  GP     Myrlene Broker L 06/09/2013, 12:01 PM

## 2013-06-09 NOTE — Progress Notes (Signed)
ANTIBIOTIC CONSULT NOTE   Pharmacy Consult for Vancomycin & Zosyn Indication: cellulitis  No Known Allergies  Patient Measurements: Height: 5\' 4"  (162.6 cm) Weight: 257 lb 11.5 oz (116.9 kg) IBW/kg (Calculated) : 54.7  Vital Signs: Temp: 98.3 F (36.8 C) (11/10 0500) Temp src: Oral (11/10 0500) BP: 169/60 mmHg (11/10 0500) Pulse Rate: 106 (11/10 0500) Intake/Output from previous day: 11/09 0701 - 11/10 0700 In: -  Out: 600 [Urine:600] Intake/Output from this shift:    Labs:  Recent Labs  06/07/13 0532 06/08/13 0722 06/09/13 0542  WBC 8.4  --  11.1*  HGB 9.7*  --  9.7*  PLT 207  --  225  CREATININE 2.43* 2.21* 2.12*   Estimated Creatinine Clearance: 27.5 ml/min (by C-G formula based on Cr of 2.12). No results found for this basename: VANCOTROUGH, VANCOPEAK, VANCORANDOM, GENTTROUGH, GENTPEAK, GENTRANDOM, TOBRATROUGH, TOBRAPEAK, TOBRARND, AMIKACINPEAK, AMIKACINTROU, AMIKACIN,  in the last 72 hours   Microbiology: No results found for this or any previous visit (from the past 720 hour(s)).  Medical History: Past Medical History  Diagnosis Date  . Anemia 2008  . DM (diabetes mellitus) 1999  . Cataract     BIL  . Atrial fibrillation   . CAD (coronary artery disease)   . CRI (chronic renal insufficiency) 2008  . Hypertension   . Hyperlipemia   . GERD (gastroesophageal reflux disease)   . Arthritis    Medications:  Scheduled:  . allopurinol  100 mg Oral q morning - 10a  . amiodarone  200 mg Oral q morning - 10a  . aspirin  81 mg Oral Daily  . citalopram  10 mg Oral Daily  . folic acid  1 mg Oral BID  . insulin aspart  0-15 Units Subcutaneous TID WC  . iron polysaccharides  150 mg Oral BID  . levothyroxine  125 mcg Oral Q breakfast  . magnesium oxide  400 mg Oral q morning - 10a  . metoprolol succinate  25 mg Oral QHS  . oxybutynin  5 mg Oral BID  . pantoprazole  40 mg Oral q morning - 10a  . piperacillin-tazobactam (ZOSYN)  IV  3.375 g Intravenous Q8H   . vancomycin  1,500 mg Intravenous Q48H  . warfarin  5 mg Oral Once  . Warfarin - Pharmacist Dosing Inpatient   Does not apply Q24H   Assessment: 79 yo obese F with hx DM admitted with RLE cellulitis. She has completed outpatient course of Doxycycline ~ 3 weeks ago and is currently on Augmentin with worsening symptoms.  Admit for IV antibiotics and r/o osteo, abscess.  Patient is noted to have chronic renal insufficiency. Normalized CrCl ~ 20-25 ml/min.  SCr is improving.    Estimated Creatinine Clearance: 27.5 ml/min (by C-G formula based on Cr of 2.12).  Vancomycin 11/6>> Zosyn 11/6>>  Goal of Therapy:  Vancomycin trough 15-45mcg/ml until osteo ruled out  Plan:  Zosyn 3.375gm IV Q8h to be infused over 4hrs (for ClCr > 20) Vancomycin 1500mg  IV q48h Check Vancomycin trough at steady state (anticipate Wed) Monitor renal function and cx data   Valrie Hart A 06/09/2013,8:00 AM

## 2013-06-09 NOTE — Clinical Documentation Improvement (Signed)
Please clarify  obesity. Thank you.  Possible Clinical conditions  Morbid Obesity W/ BMI 43.06 Other condition___________________ Cannot clinically determine _____________  Risk Factors: History of Obesity Pharmacy notes: 80yo morbidly obese female   Sign & Symptoms: H&P exam: Abdomen: Soft obese nontender and nondistended positive bowel sounds BMI = 43.06  Ht. 5'4"  Wt. 251 lbs    Thank You, Harless Litten ,RN Clinical Documentation Specialist:  [phone]  [facility]- Health Information Management

## 2013-06-09 NOTE — Clinical Social Work Placement (Signed)
    Clinical Social Work Department CLINICAL SOCIAL WORK PLACEMENT NOTE 06/16/2013  Patient:  [name], [name]  Account Number:  [id number] Admit date:  06/05/2013  Clinical Social Worker:  Santa Genera, CLINICAL SOCIAL WORKER  Date/time:  06/09/2013 12:00 N  Clinical Social Work is seeking post-discharge placement for this patient at the following level of care:   SKILLED NURSING   (*CSW will update this form in Epic as items are completed)   06/09/2013  Patient/family provided with [facility] Department of Clinical Social Work's list of facilities offering this level of care within the geographic area requested by the patient (or if unable, by the patient's family).  06/09/2013  Patient/family informed of their freedom to choose among providers that offer the needed level of care, that participate in Medicare, Medicaid or managed care program needed by the patient, have an available bed and are willing to accept the patient.  06/09/2013  Patient/family informed of [facility]' ownership interest in [facility], as well as of the fact that they are under no obligation to receive care at this facility.  PASARR submitted to EDS on 06/09/2013 PASARR number received from EDS on 06/09/2013  FL2 transmitted to all facilities in geographic area requested by pt/family on  06/09/2013 FL2 transmitted to all facilities within larger geographic area on   Patient informed that his/her managed care company has contracts with or will negotiate with  certain facilities, including the following:     Patient/family informed of bed offers received:  06/16/2013 Patient chooses bed at [facility] Physician recommends and patient chooses bed at    Patient to be transferred to [facility] on  06/16/2013 Patient to be transferred to facility by [facility]  The following physician request were entered in Epic:   Additional Comments: Patient had refused SNF,  but 11/17 states she now wants SNF.  Chooses [facility], facility checking current bed availability  Santa Genera, LCSW Clinical Social Worker [phone])

## 2013-06-10 LAB — CBC
HCT: 27.9 % — ABNORMAL LOW (ref 36.0–46.0)
Hemoglobin: 8.9 g/dL — ABNORMAL LOW (ref 12.0–15.0)
MCHC: 31.9 g/dL (ref 30.0–36.0)
MCV: 88.6 fL (ref 78.0–100.0)
RBC: 3.15 MIL/uL — ABNORMAL LOW (ref 3.87–5.11)
RDW: 13.9 % (ref 11.5–15.5)
WBC: 9.8 10*3/uL (ref 4.0–10.5)

## 2013-06-10 LAB — PROTIME-INR
INR: 1.93 — ABNORMAL HIGH (ref 0.00–1.49)
Prothrombin Time: 21.5 seconds — ABNORMAL HIGH (ref 11.6–15.2)

## 2013-06-10 LAB — BASIC METABOLIC PANEL
BUN: 36 mg/dL — ABNORMAL HIGH (ref 6–23)
Chloride: 99 mEq/L (ref 96–112)
Creatinine, Ser: 1.87 mg/dL — ABNORMAL HIGH (ref 0.50–1.10)
GFR calc Af Amer: 29 mL/min — ABNORMAL LOW (ref >90)
Glucose, Bld: 148 mg/dL — ABNORMAL HIGH (ref 70–99)
Potassium: 3.3 mEq/L — ABNORMAL LOW (ref 3.5–5.1)
Sodium: 137 mEq/L (ref 135–145)

## 2013-06-10 LAB — GLUCOSE, CAPILLARY
Glucose-Capillary: 205 mg/dL — ABNORMAL HIGH (ref 70–99)
Glucose-Capillary: 171 mg/dL — ABNORMAL HIGH (ref 70–99)
Glucose-Capillary: 165 mg/dL — ABNORMAL HIGH (ref 70–99)

## 2013-06-10 MED ORDER — WARFARIN SODIUM 5 MG PO TABS
5.0000 mg | ORAL_TABLET | Freq: Once | ORAL | Status: AC
  Administered 2013-06-10: 5 mg via ORAL
  Filled 2013-06-10: qty 1

## 2013-06-10 MED ORDER — POTASSIUM CHLORIDE CRYS ER 20 MEQ PO TBCR
40.0000 meq | EXTENDED_RELEASE_TABLET | Freq: Two times a day (BID) | ORAL | Status: AC
  Administered 2013-06-10 – 2013-06-11 (×4): 40 meq via ORAL
  Filled 2013-06-10: qty 2
  Filled 2013-06-10: qty 4
  Filled 2013-06-10 (×3): qty 2

## 2013-06-10 NOTE — Progress Notes (Signed)
Physical Therapy Wound Treatment Patient Details  Name: [name] MRN: [id number] Date of Birth: 07-02-1933  Today's Date: 06/10/2013 Time:  -     Subjective  Subjective: Pt reports decreased pain in the LLEs  Pain Score: Pain Score: 0-No pain  Wound Assessment  Wound 06/05/13 Blister (Blood filled) Toe (Comment  which one) Right small dime sized blood blister at base of third toe on rt foot. blister intact. (Active)  Site / Wound Assessment Other (Comment) 06/10/2013  8:43 AM  Wound Length (cm) 1 cm 06/05/2013  5:47 PM  Wound Width (cm) 0.5 cm 06/05/2013  5:47 PM  Drainage Amount None 06/10/2013  8:43 AM  Dressing Type Adhesive bandage 06/10/2013  8:43 AM  Dressing Status Clean;Dry;Intact 06/10/2013  8:43 AM     Wound 06/05/13 Blister (Blood filled) Toe (Comment  which one) Left fourth toe left foot, blood filled blister at base of toe nail. intact no drainage noted (Active)  Site / Wound Assessment Black;Red 06/09/2013 10:51 PM  Wound Length (cm) 0.2 cm 06/05/2013  5:47 PM  Wound Width (cm) 0.4 cm 06/05/2013  5:47 PM  Drainage Amount None 06/10/2013  8:43 AM  Dressing Type Adhesive bandage 06/10/2013  8:43 AM  Dressing Changed New 06/08/2013  6:50 PM  Dressing Status Clean;Dry;Intact 06/10/2013  8:43 AM     Wound 06/05/13 Other (Comment) Leg Left Left lower leg cellulitis with several open draining wounds, serous fluid noted (Active)  Site / Wound Assessment Clean;Pink;Yellow 06/10/2013 10:03 AM  % Wound base Red or Granulating 50% 06/10/2013 10:03 AM  % Wound base Yellow 50% 06/10/2013 10:03 AM  Wound Length (cm) 3.5 cm 06/10/2013 10:03 AM  Wound Width (cm) 3 cm 06/10/2013 10:03 AM  Wound Depth (cm) 0.2 cm 06/10/2013 10:03 AM  Tunneling (cm) 0 06/10/2013 10:03 AM  Undermining (cm) 0 06/10/2013 10:03 AM  Drainage Amount Scant 06/10/2013 10:03 AM  Drainage Description Serous 06/10/2013 10:03 AM  Treatment Hydrotherapy (Pulse lavage) 06/10/2013 10:03 AM  Dressing Type  Moist to moist 06/10/2013 10:03 AM  Dressing Changed Changed 06/10/2013 10:03 AM  Dressing Status Clean 06/10/2013 10:03 AM     Wound 06/08/13 Other (Comment) Heel Right;Left purple and non blanchable (Active)  Site / Wound Assessment Purple 06/09/2013 10:51 PM  % Wound base Other (Comment) 100% 06/09/2013 10:51 PM  Peri-wound Assessment Intact 06/09/2013 10:51 PM  Dressing Type Foam 06/10/2013  8:43 AM  Dressing Changed New 06/08/2013  6:50 PM  Dressing Status Clean;Dry;Intact 06/10/2013  8:43 AM       Wound Assessment and Plan  Wound Therapy - Assess/Plan/Recommendations Wound Therapy - Clinical Statement: The erythema over the left calf remains bright pink with edema throughout the entire leg.  The wound slough is very adherent and we were only able to debride a small amount from the wound bed. Hydrotherapy Plan: Debridement;Dressing change;Pulsatile lavage with suction  Wound Therapy Goals- Improve the function of patient's integumentary system by progressing the wound(s) through the phases of wound healing (inflammation - proliferation - remodeling) by: Increase Granulation Tissue - Progress: Progressing toward goal Decrease Length/Width/Depth - Progress: Progressing toward goal  Goals will be updated until maximal potential achieved or discharge criteria met.  Discharge criteria: when goals achieved, discharge from hospital, MD decision/surgical intervention, no progress towards goals, refusal/missing three consecutive treatments without notification or medical reason.  GP     Konrad Penta 06/10/2013, 2:31 PM

## 2013-06-10 NOTE — Clinical Social Work Note (Signed)
CSW met w patient at bedside, discussed her desire to return home at discharge rather than going to SNF for rehab.  Patient called sister, Freida Busman, and asked her to come to patient's apartment to care for patient's disabled daughter.  Sister agreed and says she is available tomorrow to assist patient.  Patient says son, Casimiro Needle, can transport her home.  Patient says she has also contacted [facility] to help w care for disabled daughter, says daughter is on waiting list.  RN CM aware that patient plans to discharge to home.  Santa Genera, LCSW Clinical Social Worker [phone])

## 2013-06-10 NOTE — Progress Notes (Addendum)
[facility] PROGRESS NOTE  CHASEY DULL AVW:[id number] DOB: 1932/12/15 DOA: 06/05/2013 PCP: Cassell Smiles., MD  Brief Narrative: [name] is a 79 y.o. female with past history of type 2 diabetes, CAD/CABG, paroxysmal A. fib on Coumadin, CK-MB 3, obesity presents to the ER today with worsening pain and swelling involving her right lower leg, found to have severe cellulitis with multiple areas with purulent drainage, had CT leg negative fr abscess, has been improving on Abx, also developed mild volume overload, on IV diuretics now.   Assessment/Plan: 1. Cellulitis -severe of L  leg  -slowly improving, now with dependent erythema but not as inflammed -On Day 5 of IV Vanc/Zosyn -continue IV Vanc for 1 more day then change to PO Doxy -CT tib-fib negative for abscess/osteo -elevate leg -low dose lasix again today -PT pulse lavage started -appreciate Dr.Keelings input  2. ARF on CKD  -baseline creatinine 1.6,  -hold enalapril  -improved back to baseline  3. DM  -Hbaic 9.7, hold oral hypoglycemics  -SSI   4. P.Afib  -rate controlled, continue metoprolol  -resume coumadin  5. CAD/CABG  -stable, continue ASA/Metoprolol   6. ACute on chronic diastolic CHF -IV lasix again today, could change to PO in 24-48hours -monitor clinically -last ECHO 2/14 with normal EF  DVt proph: on coumadin  Code Status: Full  Family Communication: none at bedside  Disposition Plan: now declines SNF due to disabled daughter, home with [facility] services in 48hours likely  Consultants:  Ortho  HPI/Subjective: Pain improving, feels tired, breathing better than yesterday, dry cough  Objective: Filed Vitals:   06/10/13 0514  BP: 142/83  Pulse: 88  Temp: 98.5 F (36.9 C)  Resp: 20    Intake/Output Summary (Last 24 hours) at 06/10/13 1146 Last data filed at 06/10/13 0515  Gross per 24 hour  Intake    600 ml  Output    200 ml  Net    400 ml   Filed Weights   06/05/13 1739  06/05/13 2300 06/07/13 0839  Weight: 114.533 kg (252 lb 8 oz) 114.5 kg (252 lb 6.8 oz) 116.9 kg (257 lb 11.5 oz)    Exam: General: AAOx3, ill appearing  HEENT: PERRLA, EOMI, oral mucosa dry  Cardiovascular: S1-S2 regular rate rhythm no murmurs rubs or gallops  Respiratory: fine basilar crackles  Abdomen: Soft obese nontender and nondistended positive bowel sounds  Skin: Multiple areas of skin breakdown on the right lower extremity  Musculoskeletal: Right lower leg had diffuse distal swelling, erythema and multiple areas of skin breakdown with purulent drainage now swelling much improved, less inflamed, dependent erythema changes in skin in LL   Data Reviewed: Basic Metabolic Panel:  Recent Labs Lab 06/06/13 0600 06/07/13 0532 06/08/13 0722 06/09/13 0542 06/10/13 0540  NA 134* 137 136 137 137  K 3.8 3.9 4.3 4.2 3.3*  CL 97 98 100 99 99  CO2 27 27 25 26 27   GLUCOSE 164* 130* 128* 163* 148*  BUN 46* 49* 43* 40* 36*  CREATININE 2.38* 2.43* 2.21* 2.12* 1.87*  CALCIUM 8.9 9.0 9.2 9.5 9.0   Liver Function Tests:  Recent Labs Lab 06/05/13 1407  AST 20  ALT 17  ALKPHOS 87  BILITOT 0.5  PROT 7.0  ALBUMIN 2.7*   No results found for this basename: LIPASE, AMYLASE,  in the last 168 hours No results found for this basename: AMMONIA,  in the last 168 hours CBC:  Recent Labs Lab 06/05/13 1407 06/06/13 0600 06/07/13 0532 06/09/13  0542 06/09/13 1121 06/10/13 0540  WBC 14.9* 8.8 8.4 11.1* 12.7* 9.8  NEUTROABS 12.5*  --   --   --   --   --   HGB 11.0* 9.8* 9.7* 9.7* 9.8* 8.9*  HCT 34.5* 30.9* 30.9* 31.1* 31.0* 27.9*  MCV 88.0 88.5 89.0 89.1 89.9 88.6  PLT 228 189 207 225 230 227   Cardiac Enzymes: No results found for this basename: CKTOTAL, CKMB, CKMBINDEX, TROPONINI,  in the last 168 hours BNP (last 3 results)  Recent Labs  11/19/12 1504  PROBNP 2466.0*   CBG:  Recent Labs Lab 06/09/13 1227 06/09/13 1643 06/09/13 2041 06/10/13 0759 06/10/13 1128   GLUCAP 180* 202* 215* 148* 205*    No results found for this or any previous visit (from the past 240 hour(s)).   Studies: No results found.  Scheduled Meds: . allopurinol  100 mg Oral q morning - 10a  . amiodarone  200 mg Oral q morning - 10a  . aspirin  81 mg Oral Daily  . citalopram  10 mg Oral Daily  . folic acid  1 mg Oral BID  . furosemide  60 mg Intravenous Daily  . insulin aspart  0-15 Units Subcutaneous TID WC  . iron polysaccharides  150 mg Oral BID  . levothyroxine  125 mcg Oral Q breakfast  . magnesium oxide  400 mg Oral q morning - 10a  . metoprolol succinate  25 mg Oral QHS  . oxybutynin  5 mg Oral BID  . pantoprazole  40 mg Oral q morning - 10a  . piperacillin-tazobactam (ZOSYN)  IV  3.375 g Intravenous Q8H  . potassium chloride  40 mEq Oral BID  . vancomycin  1,500 mg Intravenous Q48H  . warfarin  5 mg Oral Once  . Warfarin - Pharmacist Dosing Inpatient   Does not apply Q24H   Continuous Infusions:    Principal Problem:   Cellulitis and abscess of leg Active Problems:   Anemia   Atrial fibrillation   Type II or unspecified type diabetes mellitus without mention of complication, not stated as uncontrolled   Chronic kidney disease (CKD), stage III (moderate)   ARF (acute renal failure)    Time spent:    [facility]  [facility] Pager [phone]. If 7PM-7AM, please contact night-coverage at [email or web address], password [facility] 06/10/2013, 11:46 AM  LOS: 5 days

## 2013-06-10 NOTE — Care Management Note (Addendum)
    Page 1 of 2   06/16/2013     11:57:27 AM   CARE MANAGEMENT NOTE 06/16/2013  Patient:  [name], [name]   Account Number:  [id number]  Date Initiated:  06/10/2013  Documentation initiated by:  Rosemary Holms  Subjective/Objective Assessment:   Pt lives at home with daughter who has disabliity. PT recommended SNF but pt feels she must go home to care for her daughter. Pt would appreciate [facility] RN and PT. Concerned regarding cost.     Action/Plan:   Anticipated DC Date:  06/15/2013   Anticipated DC Plan:  SKILLED NURSING FACILITY  In-house referral  Clinical Social Worker      DC Planning Services  CM consult      Choice offered to / List presented to:          [facility] arranged  HH-1 RN  HH-10 DISEASE MANAGEMENT  HH-2 PT      HH agency  [facility]   Status of service:  Completed, signed off Medicare Important Message given?  YES (If response is "NO", the following Medicare IM given date fields will be blank) Date Medicare IM given:  06/16/2013 Date Additional Medicare IM given:    Discharge Disposition:  SKILLED NURSING FACILITY  Per UR Regulation:    If discussed at Long Length of Stay Meetings, dates discussed:   06/10/2013  06/12/2013    Comments:  06/13/13 Rosemary Holms RN BSN CM Apparently pt fractured her wrist yesterday. To be splinted. Pt tearful about not being able to care for her daughter at home because she may end up at a SNF. [facility] is set up for [facility] if she is able to DC home.  06/11/13 Rosemary Holms RN BSN CM Pt did really well with PT today and plans were DC to home with [facility]. MD found pt sitting on floor with low O2. DC anticipated tomorrow after diuresing and O2 sats obtained for O2 home use.  06/10/13 Rosemary Holms RN BSN CM

## 2013-06-10 NOTE — Clinical Social Work Note (Signed)
SNF admissions have asked for more information on pulse lavage wound treatment needed at discharge, PT asked to estimate duration of treatment.  Santa Genera, LCSW Clinical Social Worker [phone])

## 2013-06-10 NOTE — Clinical Social Work Note (Signed)
Call from patient's son, Madie Cahn, family has concerns about care for patient's disabled 77 year old daughter.  Considering seeing if sister/daughter can be placed in group living situation where she was previously living in [place].  While patient has been at [facility], daughter is living w brother Casimiro Needle and ex-wife, per brother he is terminally ill and disabled and is uncomfortable giving sister a bath, ex-wife got on phone and strongly expressed her inability to continue to provide care for daughter.  CSW had spoken w brother/son yesterday, explained that [facility] CSW could not assist w placement for someone who is not a patient.  Gave brother information on working through Therapist, occupational for emergency guardianship if needed and suggested that brother contact group home identified to determine if they have bed and for facility to assist family w placement needs for daughter/sister.   Per PT, patient has expressed that she will not go to rehab if safe place cannot be found for daughter (patient is her guardian).    Santa Genera, LCSW Clinical Social Worker [phone])

## 2013-06-10 NOTE — Progress Notes (Signed)
ANTICOAGULATION CONSULT NOTE   Pharmacy Consult for Coumadin Indication: atrial fibrillation  No Known Allergies  Patient Measurements: Height: 5\' 4"  (162.6 cm) Weight: 257 lb 11.5 oz (116.9 kg) IBW/kg (Calculated) : 54.7  Vital Signs: Temp: 98.5 F (36.9 C) (11/11 0514) Temp src: Oral (11/11 0514) BP: 142/83 mmHg (11/11 0514) Pulse Rate: 88 (11/11 0514)  Labs:  Recent Labs  06/08/13 0722  06/09/13 0542 06/09/13 1121 06/10/13 0540  HGB  --   < > 9.7* 9.8* 8.9*  HCT  --   --  31.1* 31.0* 27.9*  PLT  --   --  225 230 227  LABPROT 20.2*  --  20.8*  --  21.5*  INR 1.78*  --  1.85*  --  1.93*  CREATININE 2.21*  --  2.12*  --  1.87*  < > = values in this interval not displayed. Estimated Creatinine Clearance: 31.2 ml/min (by C-G formula based on Cr of 1.87).  Medical History: Past Medical History  Diagnosis Date  . Anemia 2008  . DM (diabetes mellitus) 1999  . Cataract     BIL  . Atrial fibrillation   . CAD (coronary artery disease)   . CRI (chronic renal insufficiency) 2008  . Hypertension   . Hyperlipemia   . GERD (gastroesophageal reflux disease)   . Arthritis    Medications:  Prescriptions prior to admission  Medication Sig Dispense Refill  . allopurinol (ZYLOPRIM) 100 MG tablet Take 100 mg by mouth every morning.       Marland Kitchen amiodarone (PACERONE) 200 MG tablet Take 200 mg by mouth every morning.       Marland Kitchen amoxicillin-clavulanate (AUGMENTIN) 875-125 MG per tablet Take 1 tablet by mouth 2 (two) times daily. Started on 05/28/13      . aspirin 81 MG tablet Take 162 mg by mouth daily.       . citalopram (CELEXA) 10 MG tablet Take 10 mg by mouth every morning.       . enalapril (VASOTEC) 10 MG tablet Take 10 mg by mouth every morning.       . folic acid (FOLVITE) 1 MG tablet Take 1 mg by mouth 2 (two) times daily.      . furosemide (LASIX) 80 MG tablet Take 1 tablet (80 mg total) by mouth 2 (two) times daily.  60 tablet  0  . GLIPIZIDE XL 10 MG 24 hr tablet Take 10 mg  by mouth every morning.       . iron polysaccharides (NIFEREX) 150 MG capsule Take 150 mg by mouth 2 (two) times daily.      Marland Kitchen levothyroxine (SYNTHROID, LEVOTHROID) 125 MCG tablet Take 125 mcg by mouth every morning.       . magnesium oxide (MAG-OX) 400 MG tablet Take 400 mg by mouth every morning.       . metoprolol succinate (TOPROL-XL) 25 MG 24 hr tablet Take 1 tablet by mouth at bedtime.      Marland Kitchen oxybutynin (DITROPAN) 5 MG tablet Take 5 mg by mouth 2 (two) times daily.       . pantoprazole (PROTONIX) 40 MG tablet Take 40 mg by mouth every morning.       . potassium chloride SA (K-DUR,KLOR-CON) 20 MEQ tablet Take 1 tablet (20 mEq total) by mouth 2 (two) times daily.  60 tablet  0  . pravastatin (PRAVACHOL) 40 MG tablet Take 40 mg by mouth daily.       . traMADol (ULTRAM) 50 MG tablet Take  50 mg by mouth every 4 (four) hours as needed for pain.      Marland Kitchen warfarin (COUMADIN) 5 MG tablet Take 2.5-5 mg by mouth daily. Patient takes 1 tablet(5mg )on Tues.and Thurs. And 1/2 tablet(2.5mg )all other days       Assessment: 79yo morbidly obese female who was on Coumadin PTA for h/o afib.  Home dose listed above.  INR is below goal but rising.  Pt is also on Amiodarone and aspirin.  Goal of Therapy:  INR 2-3   Plan:  Repeat Coumadin 5mg  po today x 1 INR daily  Mady Gemma 06/10/2013,10:19 AM

## 2013-06-10 NOTE — Progress Notes (Signed)
Physical Therapy Treatment Patient Details Name: [name] MRN: [id number] DOB: 19-Nov-1932 Today's Date: 06/10/2013 Time: 0905-1003 PT Time Calculation (min): 58 min  PT Assessment / Plan / Recommendation  History of Present Illness     PT Comments   Pt reports a significant decline in LE pain today.  She was willing to stand and try to ambulate using a walker following wound care.  While she did need only min assistance to transfer to standing, she was only able to ambulate about 3' with a walker due to deconditioning and overall weakness.  She states that she will not go to SNF unless her adult daughter is cared for .  Pt is unable to manage at home just yet.  Follow Up Recommendations        Does the patient have the potential to tolerate intense rehabilitation     Barriers to Discharge        Equipment Recommendations       Recommendations for Other Services    Frequency     Progress towards PT Goals Progress towards PT goals: Progressing toward goals  Plan Current plan remains appropriate    Precautions / Restrictions     Pertinent Vitals/Pain     Mobility  Bed Mobility Bed Mobility: Supine to Sit Supine to Sit: 4: Min assist Transfers Transfers: Sit to Stand;Stand to Sit Sit to Stand: 4: Min assist;With upper extremity assist;From bed;From chair/3-in-1 Stand to Sit: 4: Min assist;With upper extremity assist;To chair/3-in-1 Ambulation/Gait Ambulation/Gait Assistance: 3: Mod assist Ambulation Distance (Feet): 3 Feet Assistive device: Rolling walker Gait Pattern: Shuffle;Trunk flexed Gait velocity: very slow and labored General Gait Details: pt was basically only able to ambulate from [facility] to chair using a walker. Stairs: No Wheelchair Mobility Wheelchair Mobility: No    Exercises     PT Diagnosis:    PT Problem List:   PT Treatment Interventions:     PT Goals (current goals can now be found in the care plan section)    Visit Information  Last PT  Received On: 06/10/13    Subjective Data      Cognition       Balance     End of Session PT - End of Session Equipment Utilized During Treatment: Gait belt Activity Tolerance: Patient limited by fatigue Patient left: in chair;with call bell/phone within reach Nurse Communication: Mobility status   GP     Konrad Penta 06/10/2013, 2:20 PM

## 2013-06-10 NOTE — Progress Notes (Signed)
Inpatient Diabetes Program Recommendations  AACE/ADA: New Consensus Statement on Inpatient Glycemic Control (2013)  Target Ranges:  Prepandial:   less than 140 mg/dL      Peak postprandial:   less than 180 mg/dL (1-2 hours)      Critically ill patients:  140 - 180 mg/dL   Results for ARLENE, BRICKEL (MRN [id number]) as of 06/10/2013 08:51  Ref. Range 06/09/2013 07:57 06/09/2013 12:27 06/09/2013 16:43 06/09/2013 20:41 06/10/2013 07:59  Glucose-Capillary Latest Range: 70-99 mg/dL 409 (H) 811 (H) 914 (H) 215 (H) 148 (H)    Inpatient Diabetes Program Recommendations Correction (SSI): Please consider adding Novolog bedtime correction.  Note: Patient has a history of diabetes and takes Glipizide XL 10 mg QAM as an outpatient for diabetes management.  Currently, patient is ordered to receive Novolog 0-15 units AC for inpatient glycemic control.  Blood glucose has ranged from 148-215 mg/dl over the past 24 hours.  Will continue to follow.  Thanks, Orlando Penner, RN, MSN, CCRN Diabetes Coordinator Inpatient Diabetes Program [phone] (Team Pager) [phone] ([facility] office) [phone] [facility] office)

## 2013-06-11 ENCOUNTER — Inpatient Hospital Stay (HOSPITAL_COMMUNITY): Payer: Medicare Other

## 2013-06-11 DIAGNOSIS — I5033 Acute on chronic diastolic (congestive) heart failure: Secondary | ICD-10-CM | POA: Diagnosis present

## 2013-06-11 DIAGNOSIS — E119 Type 2 diabetes mellitus without complications: Secondary | ICD-10-CM | POA: Diagnosis present

## 2013-06-11 HISTORY — DX: Acute on chronic diastolic (congestive) heart failure: I50.33

## 2013-06-11 LAB — CBC
MCH: 27.8 pg (ref 26.0–34.0)
MCHC: 31.5 g/dL (ref 30.0–36.0)
MCV: 88.2 fL (ref 78.0–100.0)
Platelets: 253 10*3/uL (ref 150–400)
RBC: 3.38 MIL/uL — ABNORMAL LOW (ref 3.87–5.11)
RDW: 14.1 % (ref 11.5–15.5)

## 2013-06-11 LAB — BASIC METABOLIC PANEL
BUN: 31 mg/dL — ABNORMAL HIGH (ref 6–23)
CO2: 26 mEq/L (ref 19–32)
Calcium: 9.1 mg/dL (ref 8.4–10.5)
Creatinine, Ser: 1.77 mg/dL — ABNORMAL HIGH (ref 0.50–1.10)
GFR calc Af Amer: 31 mL/min — ABNORMAL LOW (ref >90)
GFR calc non Af Amer: 26 mL/min — ABNORMAL LOW (ref >90)
Sodium: 137 mEq/L (ref 135–145)

## 2013-06-11 LAB — GLUCOSE, CAPILLARY
Glucose-Capillary: 167 mg/dL — ABNORMAL HIGH (ref 70–99)
Glucose-Capillary: 216 mg/dL — ABNORMAL HIGH (ref 70–99)
Glucose-Capillary: 194 mg/dL — ABNORMAL HIGH (ref 70–99)

## 2013-06-11 LAB — VANCOMYCIN, TROUGH: Vancomycin Tr: 11.6 ug/mL (ref 10.0–20.0)

## 2013-06-11 LAB — PROTIME-INR: Prothrombin Time: 22.7 seconds — ABNORMAL HIGH (ref 11.6–15.2)

## 2013-06-11 LAB — PRO B NATRIURETIC PEPTIDE: Pro B Natriuretic peptide (BNP): 14392 pg/mL — ABNORMAL HIGH (ref 0–450)

## 2013-06-11 MED ORDER — FUROSEMIDE 10 MG/ML IJ SOLN
60.0000 mg | Freq: Two times a day (BID) | INTRAMUSCULAR | Status: DC
  Administered 2013-06-11 – 2013-06-14 (×7): 60 mg via INTRAVENOUS
  Filled 2013-06-11 (×7): qty 6

## 2013-06-11 MED ORDER — WARFARIN SODIUM 5 MG PO TABS
5.0000 mg | ORAL_TABLET | Freq: Once | ORAL | Status: AC
  Administered 2013-06-11: 5 mg via ORAL
  Filled 2013-06-11: qty 1

## 2013-06-11 MED ORDER — INSULIN GLARGINE 100 UNIT/ML ~~LOC~~ SOLN
12.0000 [IU] | Freq: Every day | SUBCUTANEOUS | Status: DC
  Administered 2013-06-11 – 2013-06-15 (×5): 12 [IU] via SUBCUTANEOUS
  Filled 2013-06-11 (×6): qty 0.12

## 2013-06-11 NOTE — Progress Notes (Signed)
ANTIBIOTIC CONSULT NOTE   Pharmacy Consult for Vancomycin Indication: cellulitis  No Known Allergies  Patient Measurements: Height: 5\' 4"  (162.6 cm) Weight: 257 lb 11.5 oz (116.9 kg) IBW/kg (Calculated) : 54.7  Vital Signs: Temp: 98.7 F (37.1 C) (11/12 0400) Temp src: Oral (11/12 0400) BP: 156/74 mmHg (11/12 0400) Pulse Rate: 104 (11/12 0400) Intake/Output from previous day: 11/11 0701 - 11/12 0700 In: 720 [P.O.:720] Out: 900 [Urine:900] Intake/Output from this shift:    Labs:  Recent Labs  06/09/13 0542 06/09/13 1121 06/10/13 0540 06/11/13 0601  WBC 11.1* 12.7* 9.8 9.7  HGB 9.7* 9.8* 8.9* 9.4*  PLT 225 230 227 253  CREATININE 2.12*  --  1.87* 1.77*   Estimated Creatinine Clearance: 32.9 ml/min (by C-G formula based on Cr of 1.77).  Recent Labs  06/11/13 0853  VANCOTROUGH 11.6    Microbiology: No results found for this or any previous visit (from the past 720 hour(s)).  Medical History: Past Medical History  Diagnosis Date  . Anemia 2008  . DM (diabetes mellitus) 1999  . Cataract     BIL  . Atrial fibrillation   . CAD (coronary artery disease)   . CRI (chronic renal insufficiency) 2008  . Hypertension   . Hyperlipemia   . GERD (gastroesophageal reflux disease)   . Arthritis    Medications:  Scheduled:  . allopurinol  100 mg Oral q morning - 10a  . amiodarone  200 mg Oral q morning - 10a  . aspirin  81 mg Oral Daily  . citalopram  10 mg Oral Daily  . folic acid  1 mg Oral BID  . furosemide  60 mg Intravenous Daily  . insulin aspart  0-15 Units Subcutaneous TID WC  . iron polysaccharides  150 mg Oral BID  . levothyroxine  125 mcg Oral Q breakfast  . magnesium oxide  400 mg Oral q morning - 10a  . metoprolol succinate  25 mg Oral QHS  . oxybutynin  5 mg Oral BID  . pantoprazole  40 mg Oral q morning - 10a  . potassium chloride  40 mEq Oral BID  . vancomycin  1,500 mg Intravenous Q48H  . warfarin  5 mg Oral Once  . Warfarin - Pharmacist  Dosing Inpatient   Does not apply Q24H   Assessment: 78 yo obese F with hx DM admitted with RLE cellulitis. She has completed outpatient course of Doxycycline ~ 3 weeks ago and is currently on Augmentin with worsening symptoms.  Admit for IV antibiotics and r/o osteo, abscess.  Patient is noted to have chronic renal insufficiency. Normalized CrCl ~ 20-25 ml/min.  SCr is improving.    Estimated Creatinine Clearance: 32.9 ml/min (by C-G formula based on Cr of 1.77). Trough level is on target for cellulitis.  MD has noted to switch to PO Doxycycline soon.  CT negative for osteomyelitis.  Zosyn d/c'd.  Vancomycin 11/6>> Zosyn 11/6>>11/11  Goal of Therapy:  Vancomycin trough 10-15  Plan:  Continue Vancomycin 1500mg  IV q48h for now Switch to PO Doxycyline per MD (possibly today) Check Vancomycin trough weekly if remains on Vanco Monitor renal function and cx data   Valrie Hart A 06/11/2013,10:54 AM

## 2013-06-11 NOTE — Progress Notes (Signed)
[facility] PROGRESS NOTE  [name] ZOX:[id number] DOB: 10-05-1932 DOA: 06/05/2013 PCP: Cassell Smiles., MD    Code Status: Full code Family Communication: Family not available. Clinical social worker's note read and acknowledged. Disposition Plan: Likely discharge to home with home health assistance.   Consultants:  None  Procedures:  Pulse lavage by physical therapy  Antibiotics:  Oral  Doxycycline 06/12/2013  IV vancomycin>>> 06/11/2013  Zosyn  HPI/Subjective: The patient is sitting up in a chair. She says that she is ready to go home. She has less discomfort in her left leg. She has some chest congestion and has a cough with yellow sputum.  Objective: Filed Vitals:   06/11/13 0400  BP: 156/74  Pulse: 104  Temp: 98.7 F (37.1 C)  Resp: 19    Intake/Output Summary (Last 24 hours) at 06/11/13 1525 Last data filed at 06/11/13 1000  Gross per 24 hour  Intake    480 ml  Output    900 ml  Net   -420 ml   Filed Weights   06/05/13 1739 06/05/13 2300 06/07/13 0839  Weight: 252 lb 8 oz (114.533 kg) 252 lb 6.8 oz (114.5 kg) 257 lb 11.5 oz (116.9 kg)    Exam:   General:  Pleasant obese 79 year old Caucasian woman sitting up in a chair, in no acute distress.  Cardiovascular: S1, S2, with a soft systolic murmur.  Respiratory: Scattered crackles throughout both lung fields. Breathing nonlabored.  Abdomen: Obese, positive bowel sounds, soft, nontender, nondistended.  Musculoskeletal: Trace to 1+ nonpitting bilateral lower extremity edema. Bandage on left leg, not taken off yet. (Will examine during pulse lavage therapy).  Neurologic: She is alert and oriented x2. Cranial nerves II through XII are intact.   Data Reviewed: Basic Metabolic Panel:  Recent Labs Lab 06/07/13 0532 06/08/13 0722 06/09/13 0542 06/10/13 0540 06/11/13 0601  NA 137 136 137 137 137  K 3.9 4.3 4.2 3.3* 4.3  CL 98 100 99 99 100  CO2 27 25 26 27 26   GLUCOSE 130*  128* 163* 148* 177*  BUN 49* 43* 40* 36* 31*  CREATININE 2.43* 2.21* 2.12* 1.87* 1.77*  CALCIUM 9.0 9.2 9.5 9.0 9.1   Liver Function Tests:  Recent Labs Lab 06/05/13 1407  AST 20  ALT 17  ALKPHOS 87  BILITOT 0.5  PROT 7.0  ALBUMIN 2.7*   No results found for this basename: LIPASE, AMYLASE,  in the last 168 hours No results found for this basename: AMMONIA,  in the last 168 hours CBC:  Recent Labs Lab 06/05/13 1407  06/07/13 0532 06/09/13 0542 06/09/13 1121 06/10/13 0540 06/11/13 0601  WBC 14.9*  < > 8.4 11.1* 12.7* 9.8 9.7  NEUTROABS 12.5*  --   --   --   --   --   --   HGB 11.0*  < > 9.7* 9.7* 9.8* 8.9* 9.4*  HCT 34.5*  < > 30.9* 31.1* 31.0* 27.9* 29.8*  MCV 88.0  < > 89.0 89.1 89.9 88.6 88.2  PLT 228  < > 207 225 230 227 253  < > = values in this interval not displayed. Cardiac Enzymes: No results found for this basename: CKTOTAL, CKMB, CKMBINDEX, TROPONINI,  in the last 168 hours BNP (last 3 results)  Recent Labs  11/19/12 1504 06/11/13 0601  PROBNP 2466.0* 14392.0*   CBG:  Recent Labs Lab 06/10/13 1128 06/10/13 1657 06/10/13 2113 06/11/13 0728 06/11/13 1147  GLUCAP 205* 171* 165* 167* 216*    No results  found for this or any previous visit (from the past 240 hour(s)).   Studies: No results found.  Scheduled Meds: . allopurinol  100 mg Oral q morning - 10a  . amiodarone  200 mg Oral q morning - 10a  . aspirin  81 mg Oral Daily  . citalopram  10 mg Oral Daily  . folic acid  1 mg Oral BID  . furosemide  60 mg Intravenous Daily  . insulin aspart  0-15 Units Subcutaneous TID WC  . iron polysaccharides  150 mg Oral BID  . levothyroxine  125 mcg Oral Q breakfast  . magnesium oxide  400 mg Oral q morning - 10a  . metoprolol succinate  25 mg Oral QHS  . oxybutynin  5 mg Oral BID  . pantoprazole  40 mg Oral q morning - 10a  . potassium chloride  40 mEq Oral BID  . vancomycin  1,500 mg Intravenous Q48H  . warfarin  5 mg Oral Once  . Warfarin -  Pharmacist Dosing Inpatient   Does not apply Q24H   Continuous Infusions:   Assessment:  Principal Problem:   Cellulitis and abscess of leg Active Problems:   Anemia   Atrial fibrillation   Type II or unspecified type diabetes mellitus without mention of complication, not stated as uncontrolled   Chronic kidney disease (CKD), stage III (moderate)   ARF (acute renal failure)  1. Cellulitis and suspected abscess of the left leg. She is improving clinically. I will examine her leg when the dressing is unwrapped for pulse lavage. She has been on vancomycin and Zosyn for more than 5 days. The plan is to discontinue vancomycin today and start doxycycline tomorrow. We'll consider discontinuing Zosyn after her leg has been examined. She is currently afebrile. Her white blood cell count has normalized.  Acute on chronic diastolic heart failure. She has pulmonary crackles on exam. Pro BNP today was greater than 14,000. Lasix was resumed yesterday, but given his being given IV. Will increase it to every 12 hours. A followup chest x-ray is pending.  Chronic atrial fibrillation. Her rate is controlled on amiodarone and metoprolol. Her INR is therapeutic currently.  Acute renal failure superimposed on stage III chronic kidney disease. Her creatinine has improved since yesterday. Her baseline creatinine is approximately 1.6-1.7. Enalapril is on hold. She was given gentle IV hydration, now which has been discontinued.  Type 2 diabetes mellitus. Her capillary blood glucoses not optimal. Glipizide is on hold. Her hemoglobin A1c is 9.7, indicating poor outpatient control. It is likely that the patient would benefit from home insulin.  Normocytic anemia. Her anemia is likely chronic. She has a history of iron deficiency anemia and takes iron supplement daily. We'll continue Protonix empirically given that she is on chronic Coumadin.  Social issues/: Astronomer. Clinical social worker, Ms. Milas Hock note  noted and acknowledged. The tentative plan is that the patient will return home with home health assistance. Apparently she is adamantly opposed to skilled nursing facility placement.    Plan: 1. Chest x-ray ordered and is pending. 2. Increased Lasix from 60 mg IV daily to every 12 hours. 3. Continue pulse lavage. Will examine her left leg when the dressing is taken off for therapy. 4. At bedtime Lantus.  Time spent: 30 minutes.    [facility]  [facility] Pager [phone]. If 7PM-7AM, please contact night-coverage at [email or web address], password [facility] 06/11/2013, 3:25 PM  LOS: 6 days

## 2013-06-11 NOTE — Progress Notes (Signed)
Nutrition Brief Note  RD pulled to chart due to LOS (day 6).  Wt Readings from Last 15 Encounters:  06/07/13 257 lb 11.5 oz (116.9 kg)  05/17/13 251 lb 9 oz (114.108 kg)  11/25/12 253 lb (114.76 kg)  11/25/12 253 lb (114.76 kg)  11/21/12 253 lb 8.5 oz (115 kg)  11/13/12 259 lb (117.482 kg)    Body mass index is 44.22 kg/(m^2). Patient meets criteria for extreme obesity, class III based on current BMI.   Current diet order is carb modified, patient is consuming approximately 100% of meals at this time. Labs and medications reviewed.   No nutrition interventions warranted at this time. If nutrition issues arise, please consult RD.   Billy Rocco A. Mayford Knife, RD, LDN Pager: [phone]

## 2013-06-11 NOTE — Clinical Social Work Note (Signed)
Sons Kathlene November and Eddie have both called, expressed concerns about feasibility of patient returning home and caring for disabled daughter.  Both state that home conditions are unsanitary, mother has limited ability to care for daughter and situation has been difficult for a number of years.  Sons were advised that questions about guardianship can be addressed through the court system which has a provision for guardianship to be reviewed.  Per son Casimiro Needle, patient has been adamant that she is returning home to care for daughter and that she does not want any contact w him nor does she want him at the home.  CSW has provided list of ALFs to patient in room, has discussed possibility and process of getting placement for daughter when she needs.    Santa Genera, LCSW Clinical Social Worker [phone])

## 2013-06-11 NOTE — Progress Notes (Signed)
ANTICOAGULATION CONSULT NOTE   Pharmacy Consult for Coumadin Indication: atrial fibrillation  No Known Allergies  Patient Measurements: Height: 5\' 4"  (162.6 cm) Weight: 257 lb 11.5 oz (116.9 kg) IBW/kg (Calculated) : 54.7  Vital Signs: Temp: 98.7 F (37.1 C) (11/12 0400) Temp src: Oral (11/12 0400) BP: 156/74 mmHg (11/12 0400) Pulse Rate: 104 (11/12 0400)  Labs:  Recent Labs  06/09/13 0542 06/09/13 1121 06/10/13 0540 06/11/13 0601  HGB 9.7* 9.8* 8.9* 9.4*  HCT 31.1* 31.0* 27.9* 29.8*  PLT 225 230 227 253  LABPROT 20.8*  --  21.5* 22.7*  INR 1.85*  --  1.93* 2.08*  CREATININE 2.12*  --  1.87* 1.77*   Estimated Creatinine Clearance: 32.9 ml/min (by C-G formula based on Cr of 1.77).  Medical History: Past Medical History  Diagnosis Date  . Anemia 2008  . DM (diabetes mellitus) 1999  . Cataract     BIL  . Atrial fibrillation   . CAD (coronary artery disease)   . CRI (chronic renal insufficiency) 2008  . Hypertension   . Hyperlipemia   . GERD (gastroesophageal reflux disease)   . Arthritis    Medications:  Prescriptions prior to admission  Medication Sig Dispense Refill  . allopurinol (ZYLOPRIM) 100 MG tablet Take 100 mg by mouth every morning.       Marland Kitchen amiodarone (PACERONE) 200 MG tablet Take 200 mg by mouth every morning.       Marland Kitchen amoxicillin-clavulanate (AUGMENTIN) 875-125 MG per tablet Take 1 tablet by mouth 2 (two) times daily. Started on 05/28/13      . aspirin 81 MG tablet Take 162 mg by mouth daily.       . citalopram (CELEXA) 10 MG tablet Take 10 mg by mouth every morning.       . enalapril (VASOTEC) 10 MG tablet Take 10 mg by mouth every morning.       . folic acid (FOLVITE) 1 MG tablet Take 1 mg by mouth 2 (two) times daily.      . furosemide (LASIX) 80 MG tablet Take 1 tablet (80 mg total) by mouth 2 (two) times daily.  60 tablet  0  . GLIPIZIDE XL 10 MG 24 hr tablet Take 10 mg by mouth every morning.       . iron polysaccharides (NIFEREX) 150 MG  capsule Take 150 mg by mouth 2 (two) times daily.      Marland Kitchen levothyroxine (SYNTHROID, LEVOTHROID) 125 MCG tablet Take 125 mcg by mouth every morning.       . magnesium oxide (MAG-OX) 400 MG tablet Take 400 mg by mouth every morning.       . metoprolol succinate (TOPROL-XL) 25 MG 24 hr tablet Take 1 tablet by mouth at bedtime.      Marland Kitchen oxybutynin (DITROPAN) 5 MG tablet Take 5 mg by mouth 2 (two) times daily.       . pantoprazole (PROTONIX) 40 MG tablet Take 40 mg by mouth every morning.       . potassium chloride SA (K-DUR,KLOR-CON) 20 MEQ tablet Take 1 tablet (20 mEq total) by mouth 2 (two) times daily.  60 tablet  0  . pravastatin (PRAVACHOL) 40 MG tablet Take 40 mg by mouth daily.       . traMADol (ULTRAM) 50 MG tablet Take 50 mg by mouth every 4 (four) hours as needed for pain.      Marland Kitchen warfarin (COUMADIN) 5 MG tablet Take 2.5-5 mg by mouth daily. Patient takes 1 tablet(5mg )on  Tues.and Thurs. And 1/2 tablet(2.5mg )all other days       Assessment: 79yo morbidly obese female who was on Coumadin PTA for h/o afib. Home dose listed above.  INR is now therapeutic at low end of goal range.  Pt is also on Amiodarone and aspirin.  Goal of Therapy:  INR 2-3   Plan:  Coumadin 5mg  po today x 1 INR daily  Valrie Hart A 06/11/2013,9:04 AM

## 2013-06-12 ENCOUNTER — Inpatient Hospital Stay (HOSPITAL_COMMUNITY): Payer: Medicare Other

## 2013-06-12 ENCOUNTER — Encounter (HOSPITAL_COMMUNITY): Payer: Self-pay | Admitting: Internal Medicine

## 2013-06-12 DIAGNOSIS — R441 Visual hallucinations: Secondary | ICD-10-CM | POA: Diagnosis present

## 2013-06-12 DIAGNOSIS — J9601 Acute respiratory failure with hypoxia: Secondary | ICD-10-CM | POA: Diagnosis present

## 2013-06-12 DIAGNOSIS — I5033 Acute on chronic diastolic (congestive) heart failure: Secondary | ICD-10-CM

## 2013-06-12 DIAGNOSIS — J9600 Acute respiratory failure, unspecified whether with hypoxia or hypercapnia: Secondary | ICD-10-CM

## 2013-06-12 HISTORY — DX: Acute respiratory failure with hypoxia: J96.01

## 2013-06-12 LAB — BLOOD GAS, ARTERIAL
Acid-Base Excess: 0.1 mmol/L (ref 0.0–2.0)
Bicarbonate: 23.4 mEq/L (ref 20.0–24.0)
Drawn by: 129711
TCO2: 21.3 mmol/L (ref 0–100)
pCO2 arterial: 32.6 mmHg — ABNORMAL LOW (ref 35.0–45.0)
pH, Arterial: 7.47 — ABNORMAL HIGH (ref 7.350–7.450)
pO2, Arterial: 59 mmHg — ABNORMAL LOW (ref 80.0–100.0)

## 2013-06-12 LAB — GLUCOSE, CAPILLARY
Glucose-Capillary: 221 mg/dL — ABNORMAL HIGH (ref 70–99)
Glucose-Capillary: 176 mg/dL — ABNORMAL HIGH (ref 70–99)

## 2013-06-12 LAB — VITAMIN B12: Vitamin B-12: 338 pg/mL (ref 211–911)

## 2013-06-12 LAB — PROTIME-INR
INR: 2.52 — ABNORMAL HIGH (ref 0.00–1.49)
Prothrombin Time: 26.3 seconds — ABNORMAL HIGH (ref 11.6–15.2)

## 2013-06-12 LAB — CBC
Hemoglobin: 9.5 g/dL — ABNORMAL LOW (ref 12.0–15.0)
MCHC: 31.6 g/dL (ref 30.0–36.0)
Platelets: 290 10*3/uL (ref 150–400)
RBC: 3.41 MIL/uL — ABNORMAL LOW (ref 3.87–5.11)

## 2013-06-12 LAB — AMMONIA: Ammonia: 66 umol/L — ABNORMAL HIGH (ref 11–60)

## 2013-06-12 MED ORDER — BUDESONIDE 0.25 MG/2ML IN SUSP
RESPIRATORY_TRACT | Status: AC
  Filled 2013-06-12: qty 2

## 2013-06-12 MED ORDER — ALBUTEROL SULFATE (5 MG/ML) 0.5% IN NEBU
2.5000 mg | INHALATION_SOLUTION | Freq: Four times a day (QID) | RESPIRATORY_TRACT | Status: DC
  Administered 2013-06-12 – 2013-06-16 (×15): 2.5 mg via RESPIRATORY_TRACT
  Filled 2013-06-12 (×14): qty 0.5

## 2013-06-12 MED ORDER — WARFARIN SODIUM 2.5 MG PO TABS
2.5000 mg | ORAL_TABLET | Freq: Once | ORAL | Status: AC
  Administered 2013-06-12: 2.5 mg via ORAL
  Filled 2013-06-12: qty 1

## 2013-06-12 MED ORDER — ALBUTEROL SULFATE (5 MG/ML) 0.5% IN NEBU
INHALATION_SOLUTION | RESPIRATORY_TRACT | Status: AC
  Filled 2013-06-12: qty 0.5

## 2013-06-12 MED ORDER — BUDESONIDE 0.25 MG/2ML IN SUSP
0.2500 mg | Freq: Two times a day (BID) | RESPIRATORY_TRACT | Status: DC
  Administered 2013-06-12 – 2013-06-16 (×7): 0.25 mg via RESPIRATORY_TRACT
  Filled 2013-06-12 (×12): qty 2

## 2013-06-12 NOTE — Progress Notes (Addendum)
[facility] PROGRESS NOTE  [name] ZOX:[id number] DOB: 1932/08/31 DOA: 06/05/2013 PCP: Cassell Smiles., MD    Code Status: Full code Family Communication: Family not available. Clinical social worker's note read and acknowledged. Disposition Plan: Likely discharge to home with home health assistance.   Consultants:  None  Procedures:  Pulse lavage by physical therapy  Antibiotics:   IV vancomycin>>> 06/11/2013  Zosyn, discontinued 06/11/2013  HPI/Subjective: The patient was found sitting on the floor this afternoon. She said that she tried to get off of the bedside commode to ambulate to the bed, but slid down against the wall. She hurt her left wrist, but denies hitting her head. Nursing also reports that the patient had been having visual hallucinations seeing, seeing bugs crawling on her bed last night and this morning. She had knowledge is this and is convinced that she had been seeing them. There has been no witnessed bugs seen by the nursing staff.  Objective: Filed Vitals:   06/12/13 0500  BP: 159/68  Pulse: 105  Temp: 97.5 F (36.4 C)  Resp: 20   Oxygen saturation 92% on supplemental oxygen.   Intake/Output Summary (Last 24 hours) at 06/12/13 1658 Last data filed at 06/12/13 0300  Gross per 24 hour  Intake      0 ml  Output    400 ml  Net   -400 ml   Filed Weights   06/05/13 2300 06/07/13 0839 06/12/13 0500  Weight: 114.5 kg (252 lb 6.8 oz) 116.9 kg (257 lb 11.5 oz) 115.6 kg (254 lb 13.6 oz)    Exam:   General:  Pleasant obese 79 year old Caucasian woman sitting on the floor, in no acute distress. She is alert and oriented to herself and hospital.  Scalp/head: No obvious hematoma or bleeding.  Cardiovascular: S1, S2, with a soft systolic murmur.  Respiratory: Scattered crackles throughout both lung fields. Breathing nonlabored.  Abdomen: Obese, positive bowel sounds, soft, nontender, nondistended.  Musculoskeletal: Trace to 1+  nonpitting bilateral lower extremity edema. Left leg with mild to moderate erythema (significantly decreased from admission per physical therapist). Left hand/wrist with tenderness and increased pain with attempted movement.  Neurologic: She is alert and oriented x2. Cranial nerves II through XII are intact. She denies visual hallucinations currently.  Data Reviewed: Basic Metabolic Panel:  Recent Labs Lab 06/07/13 0532 06/08/13 0722 06/09/13 0542 06/10/13 0540 06/11/13 0601  NA 137 136 137 137 137  K 3.9 4.3 4.2 3.3* 4.3  CL 98 100 99 99 100  CO2 27 25 26 27 26   GLUCOSE 130* 128* 163* 148* 177*  BUN 49* 43* 40* 36* 31*  CREATININE 2.43* 2.21* 2.12* 1.87* 1.77*  CALCIUM 9.0 9.2 9.5 9.0 9.1   Liver Function Tests: No results found for this basename: AST, ALT, ALKPHOS, BILITOT, PROT, ALBUMIN,  in the last 168 hours No results found for this basename: LIPASE, AMYLASE,  in the last 168 hours  Recent Labs Lab 06/12/13 0922  AMMONIA 66*   CBC:  Recent Labs Lab 06/09/13 0542 06/09/13 1121 06/10/13 0540 06/11/13 0601 06/12/13 0557  WBC 11.1* 12.7* 9.8 9.7 10.3  HGB 9.7* 9.8* 8.9* 9.4* 9.5*  HCT 31.1* 31.0* 27.9* 29.8* 30.1*  MCV 89.1 89.9 88.6 88.2 88.3  PLT 225 230 227 253 290   Cardiac Enzymes: No results found for this basename: CKTOTAL, CKMB, CKMBINDEX, TROPONINI,  in the last 168 hours BNP (last 3 results)  Recent Labs  11/19/12 1504 06/11/13 0601  PROBNP 2466.0* 14392.0*  CBG:  Recent Labs Lab 06/11/13 0728 06/11/13 1147 06/11/13 1702 06/11/13 2035 06/12/13 0739  GLUCAP 167* 216* 194* 232* 221*    No results found for this or any previous visit (from the past 240 hour(s)).   Studies: Dg Chest 2 View  06/11/2013   CLINICAL DATA:  Weakness, lower extremity edema and congestion.  EXAM: CHEST - 2 VIEW  COMPARISON:  11/19/2012  FINDINGS: Underlying chronic lung disease again noted with diffuse interstitial prominence and some pulmonary vascular  prominence identified. A component of acute interstitial edema is suspected when reviewing multiple older chest x-rays. The heart is enlarged. There is a stable appearance to a pacemaker. No pleural effusions are identified. There is stable osteopenia and degenerative disease of the thoracic spine.  IMPRESSION: Interstitial edema superimposed on chronic lung disease.   Electronically Signed   By: Irish Lack M.D.   On: 06/11/2013 20:49    Scheduled Meds: . albuterol  2.5 mg Nebulization Q6H  . allopurinol  100 mg Oral q morning - 10a  . amiodarone  200 mg Oral q morning - 10a  . aspirin  81 mg Oral Daily  . citalopram  10 mg Oral Daily  . folic acid  1 mg Oral BID  . furosemide  60 mg Intravenous BID  . insulin aspart  0-15 Units Subcutaneous TID WC  . insulin glargine  12 Units Subcutaneous QHS  . iron polysaccharides  150 mg Oral BID  . levothyroxine  125 mcg Oral Q breakfast  . magnesium oxide  400 mg Oral q morning - 10a  . metoprolol succinate  25 mg Oral QHS  . oxybutynin  5 mg Oral BID  . pantoprazole  40 mg Oral q morning - 10a  . vancomycin  1,500 mg Intravenous Q48H  . Warfarin - Pharmacist Dosing Inpatient   Does not apply Q24H   Continuous Infusions:   Assessment:  Principal Problem:   Cellulitis and abscess of leg Active Problems:   Acute respiratory failure with hypoxia   Morbid obesity   Anemia   Atrial fibrillation   Type II or unspecified type diabetes mellitus without mention of complication, not stated as uncontrolled   Chronic kidney disease (CKD), stage III (moderate)   ARF (acute renal failure)   DM (diabetes mellitus), type 2   Acute on chronic diastolic heart failure   Visual hallucinations  1. Cellulitis and suspected abscess of the left leg. Her leg is improving clinically. I will examine her leg when the dressing is unwrapped for pulse lavage. She is status post Zosyn x5 days. She is being maintained on vancomycin. We'll consider discontinuation  of vancomycin tomorrow and start doxycycline orally. She is currently afebrile. Her white blood cell count has normalized.  Visual hallucinations. Given her hypoxia, her visual hallucinations were likely the consequence of it. Her vitamin B12 level is within normal limits. Her ammonia level is marginally elevated, but not contributing to hallucinations.  Acute hypoxic respiratory failure. In evaluation for confusion, an ABG was ordered. It revealed a pH of 7.4/PCO2 of 33/PO2 of 59. The patient was started on oxygen. We'll continue treatment of acute on chronic heart failure.  Acute on chronic diastolic heart failure. She has pulmonary crackles on exam. Pro BNP today was greater than 14,000. We'll continue Lasix 60 mg IV every 12 hours. A followup chest x-ray revealed interstitial edema.   Chronic atrial fibrillation. Her rate is controlled on amiodarone and metoprolol. Her INR is therapeutic currently.  Acute renal  failure superimposed on stage III chronic kidney disease. Her creatinine has improved since yesterday. Her baseline creatinine is approximately 1.6-1.7. Enalapril is on hold. She was given gentle IV hydration, now which has been discontinued.  Type 2 diabetes mellitus. Her capillary blood glucoses not optimal. Glipizide is on hold. Her hemoglobin A1c is 9.7, indicating poor outpatient control. It is likely that the patient would benefit from home insulin.  Normocytic anemia. Her anemia is likely chronic. She has a history of iron deficiency anemia and takes iron supplement daily. We'll continue Protonix empirically given that she is on chronic Coumadin.  Status post fall. The patient had been doing well with physical therapy. She stated that she lost her balance and fell backwards and slid down the wall. She has some left wrist pain. An x-ray has been ordered.  Social issues/: Astronomer. Clinical social worker, Ms. Milas Hock note noted and acknowledged. The tentative plan is that  the patient will return home with home health assistance. Apparently she is adamantly opposed to skilled nursing facility placement.    Plan:  1. Continue supplemental oxygen added.  2. Add albuterol every 6 hours empirically. 3. X-ray of the patient's left wrist has been ordered and is pending. 4. Delay discharge.  Time spent: 30 minutes.    [facility]  [facility] Pager [phone]. If 7PM-7AM, please contact night-coverage at [email or web address], password [facility] 06/12/2013, 4:58 PM  LOS: 7 days

## 2013-06-12 NOTE — Clinical Social Work Note (Signed)
CSW reviewed PT note from today and it appears pt's mobility was improved during treatment. Pt plans to return home with home health. CSW will sign off but can be reconsulted if needed.  Derenda Fennel, [place] [phone]

## 2013-06-12 NOTE — Progress Notes (Addendum)
Physical Therapy Wound Treatment Patient Details  Name: [name] MRN: [id number] Date of Birth: [birth year]  Today's Date: 06/12/2013 Time:  -     Subjective  Subjective:  (pt has minimal to no pain in the LEs)  Pain Score:    Wound Assessment  Wound 06/05/13 Blister (Blood filled) Toe (Comment  which one) Right small dime sized blood blister at base of third toe on rt foot. blister intact. (Active)  Site / Wound Assessment Black;Red 06/12/2013  8:15 AM  Wound Length (cm) 1 cm 06/05/2013  5:47 PM  Wound Width (cm) 0.5 cm 06/05/2013  5:47 PM  Drainage Amount None 06/12/2013  8:15 AM  Dressing Type Adhesive bandage 06/10/2013  8:15 PM  Dressing Status Clean;Dry;Intact 06/10/2013  8:15 PM     Wound 06/05/13 Blister (Blood filled) Toe (Comment  which one) Left fourth toe left foot, blood filled blister at base of toe nail. intact no drainage noted (Active)  Site / Wound Assessment Red;Black 06/12/2013  8:15 AM  Wound Length (cm) 0.2 cm 06/05/2013  5:47 PM  Wound Width (cm) 0.4 cm 06/05/2013  5:47 PM  Drainage Amount None 06/12/2013  8:15 AM  Dressing Type Adhesive bandage 06/10/2013  8:15 PM  Dressing Changed New 06/08/2013  6:50 PM  Dressing Status Clean;Dry;Intact 06/10/2013  8:15 PM     Wound 06/05/13 Other (Comment) Leg Left Left lower leg cellulitis with several open draining wounds, serous fluid noted (Active)  Site / Wound Assessment Pink;Yellow 06/12/2013 10:26 AM  % Wound base Red or Granulating 50% 06/12/2013 10:26 AM  % Wound base Yellow 50% 06/12/2013 10:26 AM  Wound Length (cm) 2.3 cm 06/12/2013 10:26 AM  Wound Width (cm) 2 cm 06/12/2013 10:26 AM  Wound Depth (cm) 0.1 cm 06/12/2013 10:26 AM  Tunneling (cm) 0 06/12/2013 10:26 AM  Undermining (cm) 0 06/12/2013 10:26 AM  Drainage Amount None 06/12/2013 10:26 AM  Drainage Description Serous 06/11/2013  5:30 PM  Treatment Cleansed;Debridement (Selective) 06/12/2013 10:26 AM  Dressing Type ABD;Moist to dry 06/12/2013  10:26 AM  Dressing Changed Changed 06/12/2013 10:26 AM  Dressing Status Clean;Dry 06/12/2013 10:26 AM     Wound 06/08/13 Other (Comment) Heel Right;Left purple and non blanchable (Active)  Site / Wound Assessment Purple 06/11/2013  8:00 PM  % Wound base Other (Comment) 100% 06/11/2013  5:30 PM  Peri-wound Assessment Intact 06/11/2013  5:30 PM  Dressing Type Foam 06/12/2013  8:15 AM  Dressing Changed Changed 06/11/2013  5:30 PM  Dressing Status Clean;Dry;Intact 06/12/2013  8:15 AM   Hydrotherapy Pulsed lavage therapy - wound location:  (not needed today...wound very duperficial) Selective Debridement Selective Debridement - Location:  (yellow slough was attempted to be debrided ) Selective Debridement - Tools Used: Forceps;Scissors Selective Debridement - Tissue Removed:  (minimal yellow slough)   Wound Assessment and Plan  Wound Therapy - Assess/Plan/Recommendations Wound Therapy - Clinical Statement: The erythema over the left calf is significantly decreased in intensity and she appears to have decreased edemal.  The wound is decreased in size by about 1 cm in all directions and the slough remaining appears to be very superficial, although firmly attached.  Because  the wound was so superfical, PL was not done today...it was irrigated with sterile saline prior to debridement..  After moist gauze was placed over the wound, it was covered with dry 4x4, kerlix and 4" Ace wrap.  Pt was very comfortable with no pain. Wound Therapy - Frequency: 3X / week Wound Therapy - Follow Up  Recommendations: [facility]  Wound Therapy Goals- Improve the function of patient's integumentary system by progressing the wound(s) through the phases of wound healing (inflammation - proliferation - remodeling) by: Increase Granulation Tissue - Progress: Progressing toward goal Decrease Length/Width/Depth - Progress: Met  Will update goal at next visit  Goals will be updated until maximal potential achieved  or discharge criteria met.  Discharge criteria: when goals achieved, discharge from hospital, MD decision/surgical intervention, no progress towards goals, refusal/missing three consecutive treatments without notification or medical reason.  GP     Myrlene Broker L 06/12/2013, 1:16 PM

## 2013-06-12 NOTE — Progress Notes (Signed)
Called into patient's room at 1:50pm. Patient observed sitting on floor against wall. Patient assessed and assisted back to bed with staff assist. Patient denies hitting head. Patient complained of left wrist pain. Dr. Sherrie Mustache notified. Left wrist xray ordered. Patient's bed alarm and chair alarm in place. Patient has been educated numerous times throughout day on importance for calling for assistance. Call bell was with in reach. Will continue to montior.

## 2013-06-12 NOTE — Progress Notes (Signed)
Physical Therapy Treatment Patient Details Name: VEORA FONTE MRN: [id number] DOB: 20-May-1933 Today's Date: 06/12/2013 Time: 4098-1191 PT Time Calculation (min): 58 min  PT Assessment / Plan / Recommendation  History of Present Illness Pt states that she has minimal to no pain in her LEs and has been able to get up and move around her room.   PT Comments   Pt's mobility is getting back to her baseline.  She was able to ambulate 63' with a walker, gait pattern WNL.  Her LE pain is now minimal and pt is insisting on returning to home at discharge.  She would benefit from HHPT for generalized strengthening.  Follow Up Recommendations  Home health PT     Does the patient have the potential to tolerate intense rehabilitation     Barriers to Discharge        Equipment Recommendations       Recommendations for Other Services    Frequency     Progress towards PT Goals Progress towards PT goals: Goals met/education completed, patient discharged from PT  Plan Discharge plan needs to be updated    Precautions / Restrictions     Pertinent Vitals/Pain     Mobility  Bed Mobility Supine to Sit: 5: Supervision;HOB elevated Transfers Sit to Stand: 5: Supervision;With upper extremity assist;From chair/3-in-1;From bed Stand to Sit: 6: Modified independent (Device/Increase time);With upper extremity assist;To chair/3-in-1;To bed Ambulation/Gait Ambulation/Gait Assistance: 5: Supervision Ambulation Distance (Feet): 48 Feet Assistive device: Rolling walker Gait Pattern: Within Functional Limits Gait velocity: WNL General Gait Details: gait is much more functional now Stairs: No Wheelchair Mobility Wheelchair Mobility: No    Exercises     PT Diagnosis:    PT Problem List:   PT Treatment Interventions:     PT Goals (current goals can now be found in the care plan section)    Visit Information  Last PT Received On: 06/12/13 History of Present Illness: Pt states that she has  minimal to no pain in her LEs and has been able to get up and move around her room.    Subjective Data      Cognition  Cognition Arousal/Alertness: Awake/alert Behavior During Therapy: WFL for tasks assessed/performed Overall Cognitive Status: Within Functional Limits for tasks assessed    Balance     End of Session PT - End of Session Equipment Utilized During Treatment: Gait belt Activity Tolerance: Patient tolerated treatment well Patient left: in chair;with call bell/phone within reach Nurse Communication: Mobility status   GP     Konrad Penta 06/12/2013, 12:57 PM

## 2013-06-12 NOTE — Progress Notes (Signed)
ANTICOAGULATION CONSULT NOTE   Pharmacy Consult for Coumadin Indication: atrial fibrillation  No Known Allergies  Patient Measurements: Height: 5\' 4"  (162.6 cm) Weight: 254 lb 13.6 oz (115.6 kg) IBW/kg (Calculated) : 54.7  Vital Signs: Temp: 97.5 F (36.4 C) (11/13 0500) BP: 159/68 mmHg (11/13 0500) Pulse Rate: 105 (11/13 0500)  Labs:  Recent Labs  06/10/13 0540 06/11/13 0601 06/12/13 0557  HGB 8.9* 9.4* 9.5*  HCT 27.9* 29.8* 30.1*  PLT 227 253 290  LABPROT 21.5* 22.7* 26.3*  INR 1.93* 2.08* 2.52*  CREATININE 1.87* 1.77*  --    Estimated Creatinine Clearance: 32.7 ml/min (by C-G formula based on Cr of 1.77).  Medical History: Past Medical History  Diagnosis Date  . Anemia 2008  . DM (diabetes mellitus) 1999  . Cataract     BIL  . Atrial fibrillation   . CAD (coronary artery disease)   . CRI (chronic renal insufficiency) 2008  . Hypertension   . Hyperlipemia   . GERD (gastroesophageal reflux disease)   . Arthritis    Medications:  Prescriptions prior to admission  Medication Sig Dispense Refill  . allopurinol (ZYLOPRIM) 100 MG tablet Take 100 mg by mouth every morning.       Marland Kitchen amiodarone (PACERONE) 200 MG tablet Take 200 mg by mouth every morning.       Marland Kitchen amoxicillin-clavulanate (AUGMENTIN) 875-125 MG per tablet Take 1 tablet by mouth 2 (two) times daily. Started on 05/28/13      . aspirin 81 MG tablet Take 162 mg by mouth daily.       . citalopram (CELEXA) 10 MG tablet Take 10 mg by mouth every morning.       . enalapril (VASOTEC) 10 MG tablet Take 10 mg by mouth every morning.       . folic acid (FOLVITE) 1 MG tablet Take 1 mg by mouth 2 (two) times daily.      . furosemide (LASIX) 80 MG tablet Take 1 tablet (80 mg total) by mouth 2 (two) times daily.  60 tablet  0  . GLIPIZIDE XL 10 MG 24 hr tablet Take 10 mg by mouth every morning.       . iron polysaccharides (NIFEREX) 150 MG capsule Take 150 mg by mouth 2 (two) times daily.      Marland Kitchen levothyroxine  (SYNTHROID, LEVOTHROID) 125 MCG tablet Take 125 mcg by mouth every morning.       . magnesium oxide (MAG-OX) 400 MG tablet Take 400 mg by mouth every morning.       . metoprolol succinate (TOPROL-XL) 25 MG 24 hr tablet Take 1 tablet by mouth at bedtime.      Marland Kitchen oxybutynin (DITROPAN) 5 MG tablet Take 5 mg by mouth 2 (two) times daily.       . pantoprazole (PROTONIX) 40 MG tablet Take 40 mg by mouth every morning.       . potassium chloride SA (K-DUR,KLOR-CON) 20 MEQ tablet Take 1 tablet (20 mEq total) by mouth 2 (two) times daily.  60 tablet  0  . pravastatin (PRAVACHOL) 40 MG tablet Take 40 mg by mouth daily.       . traMADol (ULTRAM) 50 MG tablet Take 50 mg by mouth every 4 (four) hours as needed for pain.      Marland Kitchen warfarin (COUMADIN) 5 MG tablet Take 2.5-5 mg by mouth daily. Patient takes 1 tablet(5mg )on Tues.and Thurs. And 1/2 tablet(2.5mg )all other days       Assessment: 79yo morbidly obese  female who was on Coumadin PTA for h/o afib. Home dose listed above.  INR is now therapeutic.  Pt is also on Amiodarone and aspirin.  Goal of Therapy:  INR 2-3   Plan:  Coumadin 2.5mg  po today x 1 INR daily  Elson Clan 06/12/2013,8:39 AM

## 2013-06-13 ENCOUNTER — Encounter (HOSPITAL_COMMUNITY): Payer: Self-pay | Admitting: Internal Medicine

## 2013-06-13 DIAGNOSIS — W19XXXA Unspecified fall, initial encounter: Secondary | ICD-10-CM

## 2013-06-13 DIAGNOSIS — S62102A Fracture of unspecified carpal bone, left wrist, initial encounter for closed fracture: Secondary | ICD-10-CM

## 2013-06-13 HISTORY — DX: Fracture of unspecified carpal bone, left wrist, initial encounter for closed fracture: S62.102A

## 2013-06-13 LAB — GLUCOSE, CAPILLARY
Glucose-Capillary: 143 mg/dL — ABNORMAL HIGH (ref 70–99)
Glucose-Capillary: 138 mg/dL — ABNORMAL HIGH (ref 70–99)
Glucose-Capillary: 152 mg/dL — ABNORMAL HIGH (ref 70–99)

## 2013-06-13 LAB — BASIC METABOLIC PANEL
Calcium: 9.2 mg/dL (ref 8.4–10.5)
Chloride: 100 mEq/L (ref 96–112)
Creatinine, Ser: 1.68 mg/dL — ABNORMAL HIGH (ref 0.50–1.10)
GFR calc Af Amer: 33 mL/min — ABNORMAL LOW (ref >90)
GFR calc non Af Amer: 28 mL/min — ABNORMAL LOW (ref >90)
Glucose, Bld: 148 mg/dL — ABNORMAL HIGH (ref 70–99)
Potassium: 4.5 mEq/L (ref 3.5–5.1)
Sodium: 135 mEq/L (ref 135–145)

## 2013-06-13 LAB — PROTIME-INR
INR: 2.59 — ABNORMAL HIGH (ref 0.00–1.49)
Prothrombin Time: 26.9 seconds — ABNORMAL HIGH (ref 11.6–15.2)

## 2013-06-13 LAB — PRO B NATRIURETIC PEPTIDE: Pro B Natriuretic peptide (BNP): 17227 pg/mL — ABNORMAL HIGH (ref 0–450)

## 2013-06-13 MED ORDER — WARFARIN SODIUM 2.5 MG PO TABS
2.5000 mg | ORAL_TABLET | Freq: Once | ORAL | Status: AC
  Administered 2013-06-13: 2.5 mg via ORAL
  Filled 2013-06-13: qty 1

## 2013-06-13 NOTE — Consult Note (Signed)
Requesting physician for consult : [name]  PCP: [name]., MD   Specialists: Cardiology Dr. Susa [name]  Chief Complaint: Right leg pain and swelling  HPI: [name] is a 79 y.o. female with past history of type 2 diabetes, CAD/CABG, paroxysmal A. fib on Coumadin, CK-MB 3, obesity presents to the ER today with worsening pain and swelling involving her right lower leg. She reports noticing an area of redness, pain and swelling in her right lower leg about 3 weeks ago, presented to independent ER then, underwent small I&D under local anesthesia and was discharged home on oral doxycycline, she temporarily got better and then got worse again went to her M.D. and was started on Augmentin a week ago. In the past 3 days she's noticed significant worsening of swelling, increased pain and purulent drainage from couple of areas along with fevers and chills and hence presented to the emergency room today.   Review of Systems: The patient denies anorexia, fever, weight loss,, vision loss, decreased hearing, hoarseness, chest pain, syncope, dyspnea on exertion, peripheral edema, balance deficits, hemoptysis, abdominal pain, melena, hematochezia, severe indigestion/heartburn, hematuria, incontinence, genital sores, muscle weakness, suspicious skin lesions, transient blindness, difficulty walking, depression, unusual weight change, abnormal bleeding, enlarged lymph nodes, angioedema, and breast masses.     Past Medical History  Diagnosis Date  . Anemia 2008  . DM (diabetes mellitus) 1999  . Cataract     BIL  . Atrial fibrillation   . CAD (coronary artery disease)   . CRI (chronic renal insufficiency) 2008  . Hypertension   . Hyperlipemia   . GERD (gastroesophageal reflux disease)   . Arthritis   . Acute respiratory failure with hypoxia 06/12/2013  . Wrist fracture, left 06/13/2013    Past Surgical History  Procedure Laterality Date  . Esophagogastroduodenoscopy   11/07/2007    ION:GEXBMW Schatzki's ring.  Otherwise normal esophagus   . Appendectomy      APPENDICITIS  . Coronary artery bypass graft  MAR 2009  . Pacemaker insertion  MAR 2009  . Total knee arthroplasty Right     2012  . Eye surgery Left   . Coronary angioplasty    . Back surgery    . Tonsillectomy      age 50  . Insert / replace / remove pacemaker    . Joint replacement Right   . Colonoscopy with esophagogastroduodenoscopy (egd) N/A 11/25/2012    SLF:Two SMALL AC polyps/Small internal hemorrhoids/The colon IS SLIGHTLY redundant-EGD:Schatzki ring at the gastroesophageal junction/Small hiatal hernia/MILD Non-erosive gastritis    Family History  Problem Relation Age of Onset  . Colon cancer Neg Hx   . Colon polyps Neg Hx   . Cancer - Other Mother     hogkins disease  . Diabetes Other     Social History:  reports that she has never smoked. She does not have any smokeless tobacco history on file. She reports that she does not drink alcohol or use illicit drugs.  Allergies: No Known Allergies  Medications: I have reviewed the patient's current medications.  Results for orders placed during the hospital encounter of 06/05/13 (from the past 48 hour(s))  GLUCOSE, CAPILLARY     Status: Abnormal   Collection Time    06/11/13 11:47 AM      Result Value Range   Glucose-Capillary 216 (*) 70 - 99 mg/dL   Comment 1 Notify RN    GLUCOSE, CAPILLARY     Status: Abnormal   Collection Time  06/11/13  5:02 PM      Result Value Range   Glucose-Capillary 194 (*) 70 - 99 mg/dL   Comment 1 Notify RN    GLUCOSE, CAPILLARY     Status: Abnormal   Collection Time    06/11/13  8:35 PM      Result Value Range   Glucose-Capillary 232 (*) 70 - 99 mg/dL  PROTIME-INR     Status: Abnormal   Collection Time    06/12/13  5:57 AM      Result Value Range   Prothrombin Time 26.3 (*) 11.6 - 15.2 seconds   INR 2.52 (*) 0.00 - 1.49  CBC     Status: Abnormal   Collection Time    06/12/13  5:57 AM       Result Value Range   WBC 10.3  4.0 - 10.5 K/uL   RBC 3.41 (*) 3.87 - 5.11 MIL/uL   Hemoglobin 9.5 (*) 12.0 - 15.0 g/dL   HCT 62.1 (*) 30.8 - 65.7 %   MCV 88.3  78.0 - 100.0 fL   MCH 27.9  26.0 - 34.0 pg   MCHC 31.6  30.0 - 36.0 g/dL   RDW 84.6  96.2 - 95.2 %   Platelets 290  150 - 400 K/uL  GLUCOSE, CAPILLARY     Status: Abnormal   Collection Time    06/12/13  7:39 AM      Result Value Range   Glucose-Capillary 221 (*) 70 - 99 mg/dL   Comment 1 Notify RN    BLOOD GAS, ARTERIAL     Status: Abnormal   Collection Time    06/12/13  8:29 AM      Result Value Range   pH, Arterial 7.470 (*) 7.350 - 7.450   pCO2 arterial 32.6 (*) 35.0 - 45.0 mmHg   pO2, Arterial 59.0 (*) 80.0 - 100.0 mmHg   Bicarbonate 23.4  20.0 - 24.0 mEq/L   TCO2 21.3  0 - 100 mmol/L   Acid-Base Excess 0.1  0.0 - 2.0 mmol/L   O2 Saturation 90.5     Collection site RIGHT BRACHIAL     Drawn by [id number]     Sample type ARTERIAL DRAW     Allens test (pass/fail) PASS  PASS  TSH     Status: None   Collection Time    06/12/13  9:22 AM      Result Value Range   TSH 2.482  0.350 - 4.500 uIU/mL   Comment: Performed at [facility]  VITAMIN B12     Status: None   Collection Time    06/12/13  9:22 AM      Result Value Range   Vitamin B-12 338  211 - 911 pg/mL   Comment: Performed at [facility]  AMMONIA     Status: Abnormal   Collection Time    06/12/13  9:22 AM      Result Value Range   Ammonia 66 (*) 11 - 60 umol/L  GLUCOSE, CAPILLARY     Status: Abnormal   Collection Time    06/12/13 11:36 AM      Result Value Range   Glucose-Capillary 176 (*) 70 - 99 mg/dL   Comment 1 Notify RN    GLUCOSE, CAPILLARY     Status: Abnormal   Collection Time    06/12/13  4:40 PM      Result Value Range   Glucose-Capillary 153 (*) 70 - 99 mg/dL   Comment 1  Notify RN    GLUCOSE, CAPILLARY     Status: Abnormal   Collection Time    06/12/13  9:26 PM      Result Value Range   Glucose-Capillary 148 (*) 70  - 99 mg/dL  PROTIME-INR     Status: Abnormal   Collection Time    06/13/13  6:27 AM      Result Value Range   Prothrombin Time 26.9 (*) 11.6 - 15.2 seconds   INR 2.59 (*) 0.00 - 1.49  BASIC METABOLIC PANEL     Status: Abnormal   Collection Time    06/13/13  6:27 AM      Result Value Range   Sodium 135  135 - 145 mEq/L   Potassium 4.5  3.5 - 5.1 mEq/L   Chloride 100  96 - 112 mEq/L   CO2 25  19 - 32 mEq/L   Glucose, Bld 148 (*) 70 - 99 mg/dL   BUN 26 (*) 6 - 23 mg/dL   Creatinine, Ser 1.61 (*) 0.50 - 1.10 mg/dL   Calcium 9.2  8.4 - 09.6 mg/dL   GFR calc non Af Amer 28 (*) >90 mL/min   GFR calc Af Amer 33 (*) >90 mL/min   Comment: (NOTE)     The eGFR has been calculated using the CKD EPI equation.     This calculation has not been validated in all clinical situations.     eGFR's persistently <90 mL/min signify possible Chronic Kidney     Disease.  PRO B NATRIURETIC PEPTIDE     Status: Abnormal   Collection Time    06/13/13  6:27 AM      Result Value Range   Pro B Natriuretic peptide (BNP) 17227.0 (*) 0 - 450 pg/mL  GLUCOSE, CAPILLARY     Status: Abnormal   Collection Time    06/13/13  8:32 AM      Result Value Range   Glucose-Capillary 143 (*) 70 - 99 mg/dL   Comment 1 Documented in Chart     Comment 2 Notify RN     Wrist fracture noted on xrays   ROS Blood pressure 114/73, pulse 101, temperature 97.5 F (36.4 C), temperature source Oral, resp. rate 20, height 5\' 4"  (1.626 m), weight 265 lb 8 oz (120.43 kg), SpO2 97.00%. Physical Exam  1.GENERAL: The patient is found lying in bed on her back and somewhat somnolent but awakens easily. She has moderate obesity  2. CDV vascular attenuation of the left upper extremity shows normal perfusion and radial artery pulse  3. Skin: There are various areas of ecchymosis on the forearms  4. Lymph: nodes were not palpable/normal in the epitrochlear region of the left elbow  5/6. Psychiatric: Asleep but easily arousable flat  mood  7. Neuro: normal sensation in the left and right arm  8.   MSK   9.   Inspection left distal radius tenderness no swelling no deformity 10. Range of Motion painful but normal passive range of motion 11. Motor normal muscle tone no atrophy 12. Stability wrist joint normal   Imaging nondisplaced distal radius fracture  Assessment: Nondisplaced left distal radius fracture   Plan: Nonoperative treatment with splint 6 weeks  Followup 7 weeks for x-ray  Fuller Canada 06/13/2013, 11:06 AM

## 2013-06-13 NOTE — Progress Notes (Signed)
Inpatient Diabetes Program Recommendations  AACE/ADA: New Consensus Statement on Inpatient Glycemic Control (2013)  Target Ranges:  Prepandial:   less than 140 mg/dL      Peak postprandial:   less than 180 mg/dL (1-2 hours)      Critically ill patients:  140 - 180 mg/dL   Results for JARETSSI, KRAKER (MRN [id number]) as of 06/13/2013 10:51  Ref. Range 06/12/2013 07:39 06/12/2013 11:36 06/12/2013 16:40 06/12/2013 21:26 06/13/2013 08:32  Glucose-Capillary Latest Range: 70-99 mg/dL 409 (H) 811 (H) 914 (H) 148 (H) 143 (H)    Inpatient Diabetes Program Recommendations Correction (SSI): Please consider adding Novolog bedtime correction.  Thanks, Orlando Penner, RN, MSN, CCRN Diabetes Coordinator Inpatient Diabetes Program [phone] (Team Pager) [phone] ([place] office) [phone] [facility] office)

## 2013-06-13 NOTE — Progress Notes (Signed)
Physical Therapy Treatment Patient Details Name: [name] MRN: [id number] DOB: 1933-03-12 Today's Date: 06/13/2013 Time: 1191-4782 PT Time Calculation (min): 69 min  PT Assessment / Plan / Recommendation  History of Present Illness Pt now has a left wrist fx due to a fall in the room yesterday.  She states that she was getting off of the [facility] without staff assistance , fell backward and slid to the floor.  In the process, she injured her left wrist.  MD discovered that she was hypoxic at that time and pt was placed on supplemental O2 at 2 L/min.  The plan for pt to be discharged to home yesterday is having to be totally changed.  Therefore, instead of our PT goals having been met yesterday, we are having to revise the goals initially set..   PT Comments   PT was resumed due to above incident.  Pt had mild pain in the left wrist which was relieved with the application of the volar wrist splint left.  She remained on O2 during tx and O2 sat did not drop below 93%.  She was able to tolerate LE strengthening exercise without undue stress.  She is able to transfer supine to sit with SBA as long as the [facility] is fully elevated.  She was instructed in the use of a left platform walker and was able to ambulate 20' with min assistance.  This walker is a bit awkward to use and it will take some time getting used to it.  Upon rising out of bed, a small area of bright red blood was noted on the perineal pad..RN was alerted.  Pt is up in a recliner with chair alarm on.  We discussed her discharge plan and pt states that she will go anywhere as long as her daughter is taken care of.  I advised her that in my opinion, she has no choice but to go to SNF.  Wound Care update will be in another note.  Follow Up Recommendations  SNF     Does the patient have the potential to tolerate intense rehabilitation     Barriers to Discharge        Equipment Recommendations   (left platform walker)    Recommendations  for Other Services    Frequency     Progress towards PT Goals Progress towards PT goals: Goals met and updated - see care plan (discharge from PT cancelled and goals revised)  Plan Discharge plan needs to be updated    Precautions / Restrictions Precautions Required Braces or Orthoses: Other Brace/Splint Other Brace/Splint: left volar wrist splint to be worn at all times except for skin care. Restrictions Weight Bearing Restrictions: Yes RUE Weight Bearing: Non weight bearing LLE Weight Bearing: Weight bearing as tolerated   Pertinent Vitals/Pain     Mobility  Bed Mobility Supine to Sit: 5: Supervision;HOB elevated Details for Bed Mobility Assistance: as long as HOB is fully elevated, pt does not need any physical assistance to transfer to sitting.  She would be unable to assume this position from a fully supine position due to inablity to use the LUE Transfers Sit to Stand: 5: Supervision;From bed Stand to Sit: 5: Supervision;To chair/3-in-1 Details for Transfer Assistance: pt instructed to use the RUE only as an assist to stand from sitting. Ambulation/Gait Ambulation/Gait Assistance: 4: Min assist Ambulation Distance (Feet): 30 Feet Assistive device: Left platform walker Ambulation/Gait Assistance Details: left wrist in volar splint Gait Pattern: Within Functional Limits Gait velocity: slow  but steady General Gait Details: pt has some difficulty maneuvering platform walker Stairs: No Wheelchair Mobility Wheelchair Mobility: No    Exercises General Exercises - Lower Extremity Ankle Circles/Pumps: AROM;Both;10 reps;Supine Quad Sets: AROM;Both;10 reps;Supine Gluteal Sets: AROM;Both;10 reps;Supine Short Arc Quad: AAROM;Both;10 reps;Supine Heel Slides: AAROM;Both;10 reps;Supine Hip ABduction/ADduction: AAROM;Both;10 reps;Supine   PT Diagnosis:    PT Problem List:   PT Treatment Interventions:     PT Goals (current goals can now be found in the care plan section)     Visit Information  Last PT Received On: 06/13/13 History of Present Illness: Pt now has a left wrist fx due to a fall in the room yesterday.  She states that she was getting off of the [facility] without staff assistance , fell backward and slid to the floor.  In the process, she injured her left wrist.  MD discovered that she was hypoxic at that time and pt was placed on supplemental O2 at 2 L/min.  The plan for pt to be discharged to home yesterday is having to be totally changed.  Therefore, instead of our PT goals having been met yesterday, we are having to revise the goals initially set..    Subjective Data      Cognition  Cognition Arousal/Alertness: Awake/alert Behavior During Therapy: WFL for tasks assessed/performed Overall Cognitive Status: Within Functional Limits for tasks assessed    Balance  Balance Balance Assessed: Yes Static Standing Balance Static Standing - Balance Support: No upper extremity supported Static Standing - Level of Assistance: 5: Stand by assistance  End of Session PT - End of Session Equipment Utilized During Treatment: Gait belt;Other (comment) (left volar wrist splint) Activity Tolerance: Patient tolerated treatment well Patient left: in chair;with call bell/phone within reach;with chair alarm set Nurse Communication: Mobility status   GP     Konrad Penta 06/13/2013, 2:38 PM

## 2013-06-13 NOTE — Progress Notes (Signed)
ANTICOAGULATION CONSULT NOTE   Pharmacy Consult for Coumadin Indication: atrial fibrillation  No Known Allergies  Patient Measurements: Height: 5\' 4"  (162.6 cm) Weight: 265 lb 8 oz (120.43 kg) IBW/kg (Calculated) : 54.7  Vital Signs: Temp: 97.5 F (36.4 C) (11/14 0622) Temp src: Oral (11/14 0622) BP: 114/73 mmHg (11/14 0622) Pulse Rate: 101 (11/14 0622)  Labs:  Recent Labs  06/11/13 0601 06/12/13 0557 06/13/13 0627  HGB 9.4* 9.5*  --   HCT 29.8* 30.1*  --   PLT 253 290  --   LABPROT 22.7* 26.3* 26.9*  INR 2.08* 2.52* 2.59*  CREATININE 1.77*  --  1.68*   Estimated Creatinine Clearance: 35.3 ml/min (by C-G formula based on Cr of 1.68).  Medical History: Past Medical History  Diagnosis Date  . Anemia 2008  . DM (diabetes mellitus) 1999  . Cataract     BIL  . Atrial fibrillation   . CAD (coronary artery disease)   . CRI (chronic renal insufficiency) 2008  . Hypertension   . Hyperlipemia   . GERD (gastroesophageal reflux disease)   . Arthritis   . Acute respiratory failure with hypoxia 06/12/2013  . Wrist fracture, left 06/13/2013   Medications:  Prescriptions prior to admission  Medication Sig Dispense Refill  . allopurinol (ZYLOPRIM) 100 MG tablet Take 100 mg by mouth every morning.       Marland Kitchen amiodarone (PACERONE) 200 MG tablet Take 200 mg by mouth every morning.       Marland Kitchen amoxicillin-clavulanate (AUGMENTIN) 875-125 MG per tablet Take 1 tablet by mouth 2 (two) times daily. Started on 05/28/13      . aspirin 81 MG tablet Take 162 mg by mouth daily.       . citalopram (CELEXA) 10 MG tablet Take 10 mg by mouth every morning.       . enalapril (VASOTEC) 10 MG tablet Take 10 mg by mouth every morning.       . folic acid (FOLVITE) 1 MG tablet Take 1 mg by mouth 2 (two) times daily.      . furosemide (LASIX) 80 MG tablet Take 1 tablet (80 mg total) by mouth 2 (two) times daily.  60 tablet  0  . GLIPIZIDE XL 10 MG 24 hr tablet Take 10 mg by mouth every morning.        . iron polysaccharides (NIFEREX) 150 MG capsule Take 150 mg by mouth 2 (two) times daily.      Marland Kitchen levothyroxine (SYNTHROID, LEVOTHROID) 125 MCG tablet Take 125 mcg by mouth every morning.       . magnesium oxide (MAG-OX) 400 MG tablet Take 400 mg by mouth every morning.       . metoprolol succinate (TOPROL-XL) 25 MG 24 hr tablet Take 1 tablet by mouth at bedtime.      Marland Kitchen oxybutynin (DITROPAN) 5 MG tablet Take 5 mg by mouth 2 (two) times daily.       . pantoprazole (PROTONIX) 40 MG tablet Take 40 mg by mouth every morning.       . potassium chloride SA (K-DUR,KLOR-CON) 20 MEQ tablet Take 1 tablet (20 mEq total) by mouth 2 (two) times daily.  60 tablet  0  . pravastatin (PRAVACHOL) 40 MG tablet Take 40 mg by mouth daily.       . traMADol (ULTRAM) 50 MG tablet Take 50 mg by mouth every 4 (four) hours as needed for pain.      Marland Kitchen warfarin (COUMADIN) 5 MG tablet Take 2.5-5  mg by mouth daily. Patient takes 1 tablet(5mg )on Tues.and Thurs. And 1/2 tablet(2.5mg )all other days       Assessment: 79yo morbidly obese female who was on Coumadin PTA for h/o afib. Home dose listed above.  INR was therapeutic at admission.  Pt is also on Amiodarone and aspirin. INR remains in goal today.  No bleeding noted.   Goal of Therapy:  INR 2-3   Plan:  Coumadin 2.5mg  po today x 1 INR daily Continue previous home regimen at discharge  Elson Clan 06/13/2013,8:51 AM

## 2013-06-13 NOTE — Progress Notes (Signed)
Physical Therapy Wound Treatment Patient Details  Name: [name] MRN: [id number] Date of Birth: February 08, 1933  Today's Date: 06/13/2013 Time:  -     Subjective  Subjective: pt has no pain in either foot or leg  Pain Score:    Wound Assessment  Wound 06/05/13 Blister (Blood filled) Toe (Comment  which one) Right small dime sized blood blister at base of third toe on rt foot. blister intact. (Active)  Site / Wound Assessment Black;Red 06/13/2013  8:00 AM  Wound Length (cm) 1 cm 06/05/2013  5:47 PM  Wound Width (cm) 0.5 cm 06/05/2013  5:47 PM  Drainage Amount None 06/12/2013  8:00 PM  Dressing Type Adhesive bandage 06/13/2013  8:00 AM  Dressing Status Clean;Dry;Intact 06/13/2013  8:00 AM     Wound 06/05/13 Blister (Blood filled) Toe (Comment  which one) Left fourth toe left foot, blood filled blister at base of toe nail. intact no drainage noted (Active)  Site / Wound Assessment Red;Black 06/13/2013  8:00 AM  Wound Length (cm) 0.2 cm 06/05/2013  5:47 PM  Wound Width (cm) 0.4 cm 06/05/2013  5:47 PM  Drainage Amount None 06/12/2013  8:00 PM  Dressing Type Adhesive bandage 06/10/2013  8:15 PM  Dressing Changed New 06/08/2013  6:50 PM  Dressing Status Clean;Dry;Intact 06/13/2013  8:00 AM     Wound 06/05/13 Other (Comment) Leg Left Left lower leg cellulitis with several open draining wounds, serous fluid noted (Active)  Site / Wound Assessment Granulation tissue;Yellow 06/13/2013  2:44 PM  % Wound base Red or Granulating 50% 06/13/2013  2:44 PM  % Wound base Yellow 50% 06/13/2013  2:44 PM  Wound Length (cm) 2.3 cm 06/13/2013  2:44 PM  Wound Width (cm) 2 cm 06/13/2013  2:44 PM  Wound Depth (cm) 0.1 cm 06/13/2013  2:44 PM  Tunneling (cm) 0 06/13/2013  2:44 PM  Undermining (cm) 0 06/13/2013  2:44 PM  Drainage Amount None 06/13/2013  2:44 PM  Drainage Description Serosanguineous 06/12/2013 11:00 PM  Treatment Cleansed;Debridement (Selective) 06/13/2013  2:44 PM  Dressing Type ABD;Moist  to dry 06/13/2013  2:44 PM  Dressing Changed Changed 06/13/2013  2:44 PM  Dressing Status Clean;Dry 06/13/2013  2:44 PM     Wound 06/08/13 Other (Comment) Heel Right;Left purple and non blanchable (Active)  Site / Wound Assessment Purple 06/13/2013  8:00 AM  % Wound base Other (Comment) 100% 06/11/2013  5:30 PM  Peri-wound Assessment Intact 06/11/2013  5:30 PM  Treatment Other (Comment) 06/12/2013  8:00 PM  Dressing Type Foam 06/13/2013  8:00 AM  Dressing Changed Changed 06/11/2013  5:30 PM  Dressing Status Clean;Dry;Intact 06/13/2013  8:00 AM   Selective Debridement Selective Debridement - Location:  (yellow slough still firmly adhered to base of wound) Selective Debridement - Tools Used: Forceps;Scissors;Other (comment) Selective Debridement - Tissue Removed:  (pt very tender to much debridement, unable to do much)   Wound Assessment and Plan  Wound Therapy - Assess/Plan/Recommendations Wound Therapy - Clinical Statement: There is no significant change in the appearance of the left leg wound from yesterday.  The surrounding erythema is minimal and the wound size is still 2.3x2.0 cm with 50% granular tissue present.  The leg has decreased edema.  We are unable to debride much slough due to pt tenderness over this region. Hydrotherapy Plan: Debridement;Dressing change Wound Therapy - Frequency: 3X / week Wound Therapy - Follow Up Recommendations: Skilled nursing facility Wound Plan: cleansing of LLE leg wound followed by debridement, cover with moist gauze,  dry  4x4, wrap with Kerlix and then Ace Wrap for edema control.  Wound Therapy Goals- Improve the function of patient's integumentary system by progressing the wound(s) through the phases of wound healing (inflammation - proliferation - remodeling) by: Increase Granulation Tissue - Progress: Progressing toward goal  Goals will be updated until maximal potential achieved or discharge criteria met.  Discharge criteria: when goals  achieved, discharge from hospital, MD decision/surgical intervention, no progress towards goals, refusal/missing three consecutive treatments without notification or medical reason.  GP     Konrad Penta 06/13/2013, 2:54 PM

## 2013-06-13 NOTE — Progress Notes (Signed)
[facility] PROGRESS NOTE  [name] MVH:[id number] DOB: 02-05-1933 DOA: 06/05/2013 PCP: Cassell Smiles., MD    Code Status: Full code Family Communication: Discussed with son yesterday. Disposition Plan: Likely discharge to skilled nursing facility for rehabilitation when medically improved.   Consultants:  None  Procedures:  Pulse lavage by physical therapy  Antibiotics:   IV vancomycin>>>   Zosyn, discontinued 06/11/2013  HPI/Subjective: The patient has no complaints of pain or shortness of breath. She is concerned about not being able to take care of her disabled daughter at home. She is sad that she may have to go to rehabilitation because of her fractured wrist.  Objective: Filed Vitals:   06/13/13 1433  BP: 129/80  Pulse: 107  Temp: 97.8 F (36.6 C)  Resp: 20   Oxygen saturation 96% on supplemental oxygen.   Intake/Output Summary (Last 24 hours) at 06/13/13 1749 Last data filed at 06/13/13 1526  Gross per 24 hour  Intake    480 ml  Output   3000 ml  Net  -2520 ml   Filed Weights   06/07/13 0839 06/12/13 0500 06/13/13 0622  Weight: 116.9 kg (257 lb 11.5 oz) 115.6 kg (254 lb 13.6 oz) 120.43 kg (265 lb 8 oz)    Exam:   General:  Pleasant obese 79 year old Caucasian in no acute distress.  Cardiovascular: S1, S2, with a soft systolic murmur.  Respiratory: No anterior wheezes or crackles currently. Breathing is nonlabored.  Abdomen: Obese, positive bowel sounds, soft, nontender, nondistended.  Musculoskeletal: Trace  nonpitting bilateral lower extremity edema. Left leg with mild to moderate erythema (significantly decreased from admission per physical therapist). Left hand/wrist with tenderness and increased pain with attempted movement.  Neurologic: She is alert and oriented x2. Cranial nerves II through XII are intact. She denies visual hallucinations currently.  Data Reviewed: Basic Metabolic Panel:  Recent Labs Lab  06/08/13 0722 06/09/13 0542 06/10/13 0540 06/11/13 0601 06/13/13 0627  NA 136 137 137 137 135  K 4.3 4.2 3.3* 4.3 4.5  CL 100 99 99 100 100  CO2 25 26 27 26 25   GLUCOSE 128* 163* 148* 177* 148*  BUN 43* 40* 36* 31* 26*  CREATININE 2.21* 2.12* 1.87* 1.77* 1.68*  CALCIUM 9.2 9.5 9.0 9.1 9.2   Liver Function Tests: No results found for this basename: AST, ALT, ALKPHOS, BILITOT, PROT, ALBUMIN,  in the last 168 hours No results found for this basename: LIPASE, AMYLASE,  in the last 168 hours  Recent Labs Lab 06/12/13 0922  AMMONIA 66*   CBC:  Recent Labs Lab 06/09/13 0542 06/09/13 1121 06/10/13 0540 06/11/13 0601 06/12/13 0557  WBC 11.1* 12.7* 9.8 9.7 10.3  HGB 9.7* 9.8* 8.9* 9.4* 9.5*  HCT 31.1* 31.0* 27.9* 29.8* 30.1*  MCV 89.1 89.9 88.6 88.2 88.3  PLT 225 230 227 253 290   Cardiac Enzymes: No results found for this basename: CKTOTAL, CKMB, CKMBINDEX, TROPONINI,  in the last 168 hours BNP (last 3 results)  Recent Labs  11/19/12 1504 06/11/13 0601 06/13/13 0627  PROBNP 2466.0* 14392.0* 17227.0*   CBG:  Recent Labs Lab 06/12/13 1640 06/12/13 2126 06/13/13 0832 06/13/13 1119 06/13/13 1710  GLUCAP 153* 148* 143* 138* 152*    No results found for this or any previous visit (from the past 240 hour(s)).   Studies: Dg Chest 2 View  06/11/2013   CLINICAL DATA:  Weakness, lower extremity edema and congestion.  EXAM: CHEST - 2 VIEW  COMPARISON:  11/19/2012  FINDINGS: Underlying  chronic lung disease again noted with diffuse interstitial prominence and some pulmonary vascular prominence identified. A component of acute interstitial edema is suspected when reviewing multiple older chest x-rays. The heart is enlarged. There is a stable appearance to a pacemaker. No pleural effusions are identified. There is stable osteopenia and degenerative disease of the thoracic spine.  IMPRESSION: Interstitial edema superimposed on chronic lung disease.   Electronically Signed    By: Irish Lack M.D.   On: 06/11/2013 20:49   Dg Wrist 2 Views Left  06/12/2013   CLINICAL DATA:  Fall, pain.  EXAM: LEFT WRIST - 2 VIEW  COMPARISON:  None.  FINDINGS: There is an intra-articular fracture through the distal left radius. Minimally displaced fracture fragments. Ulnar styloid fracture noted. No significant angulation.  IMPRESSION: Minimally displaced intra-articular fracture of the distal left radius. Ulnar styloid fracture.   Electronically Signed   By: Charlett Nose M.D.   On: 06/12/2013 17:17    Scheduled Meds: . albuterol  2.5 mg Nebulization Q6H  . allopurinol  100 mg Oral q morning - 10a  . amiodarone  200 mg Oral q morning - 10a  . aspirin  81 mg Oral Daily  . budesonide (PULMICORT) nebulizer solution  0.25 mg Nebulization BID  . citalopram  10 mg Oral Daily  . folic acid  1 mg Oral BID  . furosemide  60 mg Intravenous BID  . insulin aspart  0-15 Units Subcutaneous TID WC  . insulin glargine  12 Units Subcutaneous QHS  . iron polysaccharides  150 mg Oral BID  . levothyroxine  125 mcg Oral Q breakfast  . magnesium oxide  400 mg Oral q morning - 10a  . metoprolol succinate  25 mg Oral QHS  . oxybutynin  5 mg Oral BID  . pantoprazole  40 mg Oral q morning - 10a  . vancomycin  1,500 mg Intravenous Q48H  . warfarin  2.5 mg Oral ONCE-1800  . Warfarin - Pharmacist Dosing Inpatient   Does not apply Q24H   Continuous Infusions:   Assessment:  Principal Problem:   Cellulitis and abscess of leg Active Problems:   Acute on chronic diastolic heart failure   Acute respiratory failure with hypoxia   Morbid obesity   Anemia   Atrial fibrillation   Type II or unspecified type diabetes mellitus without mention of complication, not stated as uncontrolled   Chronic kidney disease (CKD), stage III (moderate)   ARF (acute renal failure)   DM (diabetes mellitus), type 2   Visual hallucinations   Wrist fracture, left   Fall  1. Cellulitis and suspected abscess of the  left leg. Her leg is improving clinically. I will examine her leg when the dressing is unwrapped for pulse lavage. She is status post Zosyn x5 days. She is being maintained on vancomycin. We'll consider discontinuation of vancomycin and start doxycycline orally. She is currently afebrile. Her white blood cell count has normalized.  Visual hallucinations. Resolved. Given her hypoxemia noted on 06/12/2013, her visual hallucinations were likely the consequence of it. Her vitamin B12 level is within normal limits. Her ammonia level is marginally elevated, but not contributing to hallucinations.  Acute hypoxic respiratory failure. In evaluation for confusion, an ABG was ordered. On 06/12/2013, it revealed a pH of 7.4/PCO2 of 33/PO2 of 59. The patient was started on oxygen. We'll continue treatment of acute on chronic heart failure treatment and bronchodilators.  Acute on chronic diastolic heart failure. We'll continue IV Lasix. She is diuresing  well.  Chronic atrial fibrillation. Her rate is controlled on amiodarone and metoprolol. Her INR is therapeutic currently.  Acute renal failure superimposed on stage III chronic kidney disease. Her creatinine is improving. Her baseline creatinine is approximately 1.6-1.7. Enalapril is on hold. She was given gentle IV hydration, which has been discontinued.  Type 2 diabetes mellitus. Her capillary blood glucoses is improving on insulin in the hospital. Glipizide is on hold. Her hemoglobin A1c is 9.7, indicating poor outpatient control. It is likely that the patient would benefit from home insulin.  Normocytic anemia. Her anemia is likely chronic. She has a history of iron deficiency anemia and takes iron supplement daily. We'll continue Protonix empirically given that she is on chronic Coumadin.  Acute left wrist fracture status post fall in the hospital. Dr. Mort Sawyers recommendation is noted and appreciated. He recommended non-operative treatment with a splint for  6 weeks. Followup x-ray in 7 weeks.  Social issues/: Astronomer. The patient realizes that she may not be able to take care of herself and her disabled daughter given the inability to use her left upper extremity for 6 weeks. She is reconsidering skilled nursing facility placement for rehabilitation.    Plan:  1. Continue current treatment.  Time spent: 30 minutes.    [facility]  [facility] Pager [phone]. If 7PM-7AM, please contact night-coverage at [email or web address], password [facility] 06/13/2013, 5:49 PM  LOS: 8 days

## 2013-06-13 NOTE — Progress Notes (Signed)
Fracture left wrist non displaced  Assessment/Plan: Splint x 6 weeks   X-rays in 6-7 weeks    [name] 06/13/2013, 11:04 AM

## 2013-06-14 DIAGNOSIS — S62109A Fracture of unspecified carpal bone, unspecified wrist, initial encounter for closed fracture: Secondary | ICD-10-CM

## 2013-06-14 LAB — GLUCOSE, CAPILLARY
Glucose-Capillary: 109 mg/dL — ABNORMAL HIGH (ref 70–99)
Glucose-Capillary: 165 mg/dL — ABNORMAL HIGH (ref 70–99)
Glucose-Capillary: 229 mg/dL — ABNORMAL HIGH (ref 70–99)

## 2013-06-14 LAB — PROTIME-INR
INR: 2.58 — ABNORMAL HIGH (ref 0.00–1.49)
Prothrombin Time: 26.8 seconds — ABNORMAL HIGH (ref 11.6–15.2)

## 2013-06-14 LAB — BASIC METABOLIC PANEL
BUN: 26 mg/dL — ABNORMAL HIGH (ref 6–23)
CO2: 29 mEq/L (ref 19–32)
Calcium: 9.2 mg/dL (ref 8.4–10.5)
Chloride: 98 mEq/L (ref 96–112)
GFR calc Af Amer: 30 mL/min — ABNORMAL LOW (ref >90)
GFR calc non Af Amer: 26 mL/min — ABNORMAL LOW (ref >90)
Sodium: 136 mEq/L (ref 135–145)

## 2013-06-14 MED ORDER — FUROSEMIDE 40 MG PO TABS
60.0000 mg | ORAL_TABLET | Freq: Every day | ORAL | Status: DC
  Administered 2013-06-15: 09:00:00 60 mg via ORAL
  Filled 2013-06-14 (×2): qty 1

## 2013-06-14 MED ORDER — WARFARIN SODIUM 2.5 MG PO TABS
2.5000 mg | ORAL_TABLET | ORAL | Status: AC
  Administered 2013-06-14 – 2013-06-15 (×2): 2.5 mg via ORAL
  Filled 2013-06-14 (×2): qty 1

## 2013-06-14 NOTE — Progress Notes (Signed)
[facility] PROGRESS NOTE  [name]:[id number] DOB: 1933-04-21 DOA: 06/05/2013 PCP: Cassell Smiles., MD    Code Status: Full code Family Communication: Discussed with son on 06/12/2013 Disposition Plan: Likely discharge to skilled nursing facility for rehabilitation when medically improved.   Consultants:  None  Procedures:  Pulse lavage by physical therapy, now discontinued.  Antibiotics:   IV vancomycin>>> 06/14/2013  Zosyn, discontinued 06/11/2013  HPI/Subjective: The patient has agreed to skilled nursing facility placement. She has no complaints of leg pain, left wrist pain, or shortness of breath. She says that she is breathing better.  Objective: Filed Vitals:   06/14/13 0847  BP:   Pulse: 87  Temp:   Resp: 14   Temperature 99.0. Pulse 87. Respiratory rate 14. Oxygen saturation 97% on 2 L of nasal cannula oxygen.   Intake/Output Summary (Last 24 hours) at 06/14/13 1754 Last data filed at 06/13/13 2130  Gross per 24 hour  Intake      0 ml  Output    850 ml  Net   -850 ml   Filed Weights   06/12/13 0500 06/13/13 0622 06/14/13 0500  Weight: 115.6 kg (254 lb 13.6 oz) 120.43 kg (265 lb 8 oz) 117.6 kg (259 lb 4.2 oz)    Exam:   General:  Pleasant obese 79 year old Caucasian in no acute distress.  Cardiovascular: S1, S2, with a soft systolic murmur.  Respiratory: No anterior wheezes or crackles currently. Breathing is nonlabored.  Abdomen: Obese, positive bowel sounds, soft, nontender, nondistended.  Musculoskeletal: Trace  nonpitting bilateral lower extremity edema. Left leg with mild to moderate erythema (significantly decreased from admission per physical therapist). Left hand/wrist with a splint in place. She is able to wiggle her fingers.  Neurologic: She is alert and oriented x2. Cranial nerves II through XII are intact. She denies visual hallucinations currently.  Data Reviewed: Basic Metabolic Panel:  Recent Labs Lab  06/09/13 0542 06/10/13 0540 06/11/13 0601 06/13/13 0627 06/14/13 0632  NA 137 137 137 135 136  K 4.2 3.3* 4.3 4.5 3.9  CL 99 99 100 100 98  CO2 26 27 26 25 29   GLUCOSE 163* 148* 177* 148* 120*  BUN 40* 36* 31* 26* 26*  CREATININE 2.12* 1.87* 1.77* 1.68* 1.81*  CALCIUM 9.5 9.0 9.1 9.2 9.2   Liver Function Tests: No results found for this basename: AST, ALT, ALKPHOS, BILITOT, PROT, ALBUMIN,  in the last 168 hours No results found for this basename: LIPASE, AMYLASE,  in the last 168 hours  Recent Labs Lab 06/12/13 0922  AMMONIA 66*   CBC:  Recent Labs Lab 06/09/13 0542 06/09/13 1121 06/10/13 0540 06/11/13 0601 06/12/13 0557  WBC 11.1* 12.7* 9.8 9.7 10.3  HGB 9.7* 9.8* 8.9* 9.4* 9.5*  HCT 31.1* 31.0* 27.9* 29.8* 30.1*  MCV 89.1 89.9 88.6 88.2 88.3  PLT 225 230 227 253 290   Cardiac Enzymes: No results found for this basename: CKTOTAL, CKMB, CKMBINDEX, TROPONINI,  in the last 168 hours BNP (last 3 results)  Recent Labs  11/19/12 1504 06/11/13 0601 06/13/13 0627  PROBNP 2466.0* 14392.0* 17227.0*   CBG:  Recent Labs Lab 06/13/13 1119 06/13/13 1710 06/13/13 2115 06/14/13 0750 06/14/13 1150  GLUCAP 138* 152* 137* 109* 165*    No results found for this or any previous visit (from the past 240 hour(s)).   Studies: No results found.  Scheduled Meds: . albuterol  2.5 mg Nebulization Q6H  . allopurinol  100 mg Oral q morning - 10a  .  amiodarone  200 mg Oral q morning - 10a  . aspirin  81 mg Oral Daily  . budesonide (PULMICORT) nebulizer solution  0.25 mg Nebulization BID  . citalopram  10 mg Oral Daily  . folic acid  1 mg Oral BID  . furosemide  60 mg Intravenous BID  . insulin aspart  0-15 Units Subcutaneous TID WC  . insulin glargine  12 Units Subcutaneous QHS  . iron polysaccharides  150 mg Oral BID  . levothyroxine  125 mcg Oral Q breakfast  . magnesium oxide  400 mg Oral q morning - 10a  . metoprolol succinate  25 mg Oral QHS  . oxybutynin  5  mg Oral BID  . pantoprazole  40 mg Oral q morning - 10a  . warfarin  2.5 mg Oral Q24 Hr x 2  . Warfarin - Pharmacist Dosing Inpatient   Does not apply Q24H   Continuous Infusions:   Assessment:  Principal Problem:   Cellulitis and abscess of leg Active Problems:   Acute on chronic diastolic heart failure   Acute respiratory failure with hypoxia   Morbid obesity   Anemia   Atrial fibrillation   Type II or unspecified type diabetes mellitus without mention of complication, not stated as uncontrolled   Chronic kidney disease (CKD), stage III (moderate)   ARF (acute renal failure)   DM (diabetes mellitus), type 2   Visual hallucinations   Wrist fracture, left   Fall  1. Cellulitis and suspected abscess of the left leg. Her leg is improving clinically. She is status post 10 days of antibiotic therapy. Will discontinue vancomycin.   Visual hallucinations. Resolved. Given her hypoxemia noted on 06/12/2013, her visual hallucinations were likely the consequence of it. Her vitamin B12 level is within normal limits. Her ammonia level was marginally elevated, but not contributing to hallucinations.  Acute hypoxic respiratory failure. In evaluation for confusion, an ABG was ordered. On 06/12/2013, it revealed a pH of 7.4/PCO2 of 33/PO2 of 59. The patient was started on oxygen. We'll continue treatment of acute on chronic heart failure treatment and bronchodilators.  Acute on chronic diastolic heart failure. She is on q. 12 hour dosing of IV Lasix, but the cause her creatinine has increased a little, will change it back to 60 mg daily. She is diuresing well. We'll order a followup chest x-ray and proBNP tomorrow. She is diuresing well.  Chronic atrial fibrillation. Her rate is controlled on amiodarone and metoprolol. Her INR is therapeutic currently.  Acute renal failure superimposed on stage III chronic kidney disease. Initially she was given IV fluid hydration and Lasix was withheld. However  she developed acute on chronic diastolic dysfunction and IV Lasix had to be resumed. Her creatinine had improved, but his waxing and waning in light of IV Lasix.Marland Kitchen Her baseline creatinine is approximately 1.6-1.7. Enalapril is on hold.  Type 2 diabetes mellitus. Her capillary blood glucoses is improving on insulin in the hospital. Glipizide is on hold. Her hemoglobin A1c is 9.7, indicating poor outpatient control. The patient would benefit from home insulin.  Normocytic anemia. Her anemia is likely chronic. She has a history of iron deficiency anemia and takes iron supplement daily. We'll continue Protonix empirically given that she is on chronic Coumadin.  Acute left wrist fracture status post fall in the hospital. Dr. Mort Sawyers recommendation is noted and appreciated. He recommended non-operative treatment with a splint for 6 weeks. Followup x-ray in 7 weeks.  Social issues/: Astronomer. The patient realizes that she  may not be able to take care of herself and her disabled daughter given the inability to use her left upper extremity for 6 weeks. She has agreed to skilled nursing facility placement for rehabilitation.    Plan:  1. Continue treatment as stated above in the assessment.  Time spent: 20 minutes.    [facility]  [facility] Pager [phone]. If 7PM-7AM, please contact night-coverage at [email or web address], password [facility] 06/14/2013, 5:54 PM  LOS: 9 days

## 2013-06-14 NOTE — Progress Notes (Signed)
Wound care provided to LLE ceulluitis per order. Wound pink/yellow, periwound red and tender.  Cleaned open area with nss, applied wet to dry dressing, covered with abd pad, wrapped with curlex and then with ace wrap. Client tolerated well.

## 2013-06-14 NOTE — Progress Notes (Addendum)
ANTICOAGULATION CONSULT NOTE   Pharmacy Consult for Coumadin Indication: atrial fibrillation  No Known Allergies  Patient Measurements: Height: 5\' 4"  (162.6 cm) Weight: 259 lb 4.2 oz (117.6 kg) IBW/kg (Calculated) : 54.7  Vital Signs: Temp: 98 F (36.7 C) (11/15 0500) Temp src: Oral (11/15 0500) BP: 162/81 mmHg (11/15 0500) Pulse Rate: 87 (11/15 0847)  Labs:  Recent Labs  06/12/13 0557 06/13/13 0627 06/14/13 0632  HGB 9.5*  --   --   HCT 30.1*  --   --   PLT 290  --   --   LABPROT 26.3* 26.9* 26.8*  INR 2.52* 2.59* 2.58*  CREATININE  --  1.68* 1.81*   Estimated Creatinine Clearance: 32.3 ml/min (by C-G formula based on Cr of 1.81).  Medical History: Past Medical History  Diagnosis Date  . Anemia 2008  . DM (diabetes mellitus) 1999  . Cataract     BIL  . Atrial fibrillation   . CAD (coronary artery disease)   . CRI (chronic renal insufficiency) 2008  . Hypertension   . Hyperlipemia   . GERD (gastroesophageal reflux disease)   . Arthritis   . Acute respiratory failure with hypoxia 06/12/2013  . Wrist fracture, left 06/13/2013   Medications:  Prescriptions prior to admission  Medication Sig Dispense Refill  . allopurinol (ZYLOPRIM) 100 MG tablet Take 100 mg by mouth every morning.       Marland Kitchen amiodarone (PACERONE) 200 MG tablet Take 200 mg by mouth every morning.       Marland Kitchen amoxicillin-clavulanate (AUGMENTIN) 875-125 MG per tablet Take 1 tablet by mouth 2 (two) times daily. Started on 05/28/13      . aspirin 81 MG tablet Take 162 mg by mouth daily.       . citalopram (CELEXA) 10 MG tablet Take 10 mg by mouth every morning.       . enalapril (VASOTEC) 10 MG tablet Take 10 mg by mouth every morning.       . folic acid (FOLVITE) 1 MG tablet Take 1 mg by mouth 2 (two) times daily.      . furosemide (LASIX) 80 MG tablet Take 1 tablet (80 mg total) by mouth 2 (two) times daily.  60 tablet  0  . GLIPIZIDE XL 10 MG 24 hr tablet Take 10 mg by mouth every morning.       .  iron polysaccharides (NIFEREX) 150 MG capsule Take 150 mg by mouth 2 (two) times daily.      Marland Kitchen levothyroxine (SYNTHROID, LEVOTHROID) 125 MCG tablet Take 125 mcg by mouth every morning.       . magnesium oxide (MAG-OX) 400 MG tablet Take 400 mg by mouth every morning.       . metoprolol succinate (TOPROL-XL) 25 MG 24 hr tablet Take 1 tablet by mouth at bedtime.      Marland Kitchen oxybutynin (DITROPAN) 5 MG tablet Take 5 mg by mouth 2 (two) times daily.       . pantoprazole (PROTONIX) 40 MG tablet Take 40 mg by mouth every morning.       . potassium chloride SA (K-DUR,KLOR-CON) 20 MEQ tablet Take 1 tablet (20 mEq total) by mouth 2 (two) times daily.  60 tablet  0  . pravastatin (PRAVACHOL) 40 MG tablet Take 40 mg by mouth daily.       . traMADol (ULTRAM) 50 MG tablet Take 50 mg by mouth every 4 (four) hours as needed for pain.      Marland Kitchen warfarin (  COUMADIN) 5 MG tablet Take 2.5-5 mg by mouth daily. Patient takes 1 tablet(5mg )on Tues.and Thurs. And 1/2 tablet(2.5mg )all other days       Assessment: 80yo morbidly obese female who was on Coumadin PTA for h/o afib. Home dose listed above.  INR was therapeutic at admission.  Pt is also on Amiodarone and aspirin. INR remains in goal today.  No bleeding noted.   Goal of Therapy:  INR 2-3   Plan:  Coumadin 2.5mg  po today & tomorrow (per home regimen) INR on MWF Continue previous home regimen at discharge  Elson Clan 06/14/2013,8:52 AM

## 2013-06-15 ENCOUNTER — Encounter (HOSPITAL_COMMUNITY): Payer: Self-pay | Admitting: Internal Medicine

## 2013-06-15 ENCOUNTER — Inpatient Hospital Stay (HOSPITAL_COMMUNITY): Payer: Medicare Other

## 2013-06-15 LAB — GLUCOSE, CAPILLARY
Glucose-Capillary: 183 mg/dL — ABNORMAL HIGH (ref 70–99)
Glucose-Capillary: 215 mg/dL — ABNORMAL HIGH (ref 70–99)

## 2013-06-15 LAB — BASIC METABOLIC PANEL
BUN: 26 mg/dL — ABNORMAL HIGH (ref 6–23)
Chloride: 98 mEq/L (ref 96–112)
GFR calc Af Amer: 31 mL/min — ABNORMAL LOW (ref >90)
Potassium: 3.9 mEq/L (ref 3.5–5.1)
Sodium: 136 mEq/L (ref 135–145)

## 2013-06-15 MED ORDER — FUROSEMIDE 40 MG PO TABS
60.0000 mg | ORAL_TABLET | Freq: Two times a day (BID) | ORAL | Status: DC
  Administered 2013-06-15: 60 mg via ORAL
  Filled 2013-06-15 (×2): qty 1

## 2013-06-15 MED ORDER — PREDNISONE 20 MG PO TABS
40.0000 mg | ORAL_TABLET | Freq: Two times a day (BID) | ORAL | Status: DC
  Administered 2013-06-15 – 2013-06-16 (×2): 40 mg via ORAL
  Filled 2013-06-15 (×2): qty 2

## 2013-06-15 NOTE — Progress Notes (Signed)
[facility] PROGRESS NOTE  [name]:[id number] DOB: 02-22-1933 DOA: 06/05/2013 PCP: Cassell Smiles., MD    Code Status: Full code Family Communication: Discussed with son on 06/12/2013 Disposition Plan: Likely discharge to skilled nursing facility for rehabilitation when medically improved.   Consultants:  None  Procedures:  Pulse lavage by physical therapy, now discontinued.  Antibiotics:   IV vancomycin>>> 06/14/2013  Zosyn, discontinued 06/11/2013  HPI/Subjective:   Objective: Filed Vitals:   06/15/13 0500  BP: 144/59  Pulse: 95  Temp: 98.6 F (37 C)  Resp: 14   Temperature 99.0. Pulse 87. Respiratory rate 14. Oxygen saturation 97% on 2 L of nasal cannula oxygen.   Intake/Output Summary (Last 24 hours) at 06/15/13 1241 Last data filed at 06/15/13 0500  Gross per 24 hour  Intake    240 ml  Output   2450 ml  Net  -2210 ml   Filed Weights   06/13/13 0622 06/14/13 0500 06/15/13 0500  Weight: 120.43 kg (265 lb 8 oz) 117.6 kg (259 lb 4.2 oz) 117.2 kg (258 lb 6.1 oz)    Exam:   General:  Pleasant obese 79 year old Caucasian in no acute distress.  Cardiovascular: Irregular, irregular with a soft systolic murmur.  Respiratory: A few scattered upper airway wheezes and crackles. Breathing is nonlabored.  Abdomen: Obese, positive bowel sounds, soft, nontender, nondistended.  Musculoskeletal: Trace  nonpitting bilateral lower extremity edema. Left leg with mild to moderate erythema (significantly decreased from admission per physical therapist). Left hand/wrist with a splint in place. She is able to wiggle her fingers.  Neurologic: She is alert and oriented x2. Cranial nerves II through XII are intact. She denies visual hallucinations currently.  Data Reviewed: Basic Metabolic Panel:  Recent Labs Lab 06/10/13 0540 06/11/13 0601 06/13/13 0627 06/14/13 0632 06/15/13 0451  NA 137 137 135 136 136  K 3.3* 4.3 4.5 3.9 3.9  CL 99 100  100 98 98  CO2 27 26 25 29 30   GLUCOSE 148* 177* 148* 120* 158*  BUN 36* 31* 26* 26* 26*  CREATININE 1.87* 1.77* 1.68* 1.81* 1.76*  CALCIUM 9.0 9.1 9.2 9.2 9.1   Liver Function Tests: No results found for this basename: AST, ALT, ALKPHOS, BILITOT, PROT, ALBUMIN,  in the last 168 hours No results found for this basename: LIPASE, AMYLASE,  in the last 168 hours  Recent Labs Lab 06/12/13 0922  AMMONIA 66*   CBC:  Recent Labs Lab 06/09/13 0542 06/09/13 1121 06/10/13 0540 06/11/13 0601 06/12/13 0557  WBC 11.1* 12.7* 9.8 9.7 10.3  HGB 9.7* 9.8* 8.9* 9.4* 9.5*  HCT 31.1* 31.0* 27.9* 29.8* 30.1*  MCV 89.1 89.9 88.6 88.2 88.3  PLT 225 230 227 253 290   Cardiac Enzymes: No results found for this basename: CKTOTAL, CKMB, CKMBINDEX, TROPONINI,  in the last 168 hours BNP (last 3 results)  Recent Labs  11/19/12 1504 06/11/13 0601 06/13/13 0627  PROBNP 2466.0* 14392.0* 17227.0*   CBG:  Recent Labs Lab 06/14/13 1150 06/14/13 1627 06/14/13 2105 06/15/13 0818 06/15/13 1146  GLUCAP 165* 183* 229* 141* 173*    No results found for this or any previous visit (from the past 240 hour(s)).   Studies: No results found.  Scheduled Meds: . albuterol  2.5 mg Nebulization Q6H  . allopurinol  100 mg Oral q morning - 10a  . amiodarone  200 mg Oral q morning - 10a  . aspirin  81 mg Oral Daily  . budesonide (PULMICORT) nebulizer solution  0.25 mg  Nebulization BID  . citalopram  10 mg Oral Daily  . folic acid  1 mg Oral BID  . furosemide  60 mg Oral BID  . insulin aspart  0-15 Units Subcutaneous TID WC  . insulin glargine  12 Units Subcutaneous QHS  . iron polysaccharides  150 mg Oral BID  . levothyroxine  125 mcg Oral Q breakfast  . magnesium oxide  400 mg Oral q morning - 10a  . metoprolol succinate  25 mg Oral QHS  . oxybutynin  5 mg Oral BID  . pantoprazole  40 mg Oral q morning - 10a  . warfarin  2.5 mg Oral Q24 Hr x 2  . Warfarin - Pharmacist Dosing Inpatient   Does  not apply Q24H   Continuous Infusions:   Assessment:  Principal Problem:   Cellulitis and abscess of leg Active Problems:   Acute on chronic diastolic heart failure   Acute respiratory failure with hypoxia   Morbid obesity   Anemia   Atrial fibrillation   Type II or unspecified type diabetes mellitus without mention of complication, not stated as uncontrolled   Chronic kidney disease (CKD), stage III (moderate)   ARF (acute renal failure)   DM (diabetes mellitus), type 2   Visual hallucinations   Wrist fracture, left   Fall  1. Cellulitis and suspected abscess of the left leg. Her leg has improved clinically. She is status post 10 days of antibiotic therapy with vancomycin and/or Zosyn.   Visual hallucinations. Resolved. Given her hypoxemia noted on 06/12/2013, her visual hallucinations were likely the consequence of it. Her vitamin B12 level is within normal limits. Her ammonia level was marginally elevated, but not contributing to hallucinations.  Acute hypoxic respiratory failure. In evaluation for confusion, an ABG was ordered. On 06/12/2013, it revealed a pH of 7.4/PCO2 of 33/PO2 of 59. The patient was started on oxygen. We'll continue treatment of acute on chronic heart failure treatment and bronchodilators. We'll add a prednisone taper for possible superimposed bronchitis.  Acute on chronic diastolic heart failure. She is on q. 12 hour dosing of IV Lasix, but the cause her creatinine has increased a little, will change it back to 60 mg daily. She is diuresing well, negative almost 3 L..  Chronic atrial fibrillation. Her rate is controlled on amiodarone and metoprolol. Her INR is therapeutic currently.  Acute renal failure superimposed on stage III chronic kidney disease. Initially she was given IV fluid hydration and Lasix was withheld. However she developed acute on chronic diastolic dysfunction and IV Lasix had to be resumed. Her creatinine had improved, but his waxing and  waning in light of IV Lasix.Marland Kitchen Her baseline creatinine is approximately 1.6-1.7. Enalapril is on hold.  Type 2 diabetes mellitus. Her capillary blood glucoses is improving on insulin in the hospital. Glipizide is on hold. Her hemoglobin A1c is 9.7, indicating poor outpatient control. The patient would benefit from home insulin.  Normocytic anemia. Her anemia is likely chronic. She has a history of iron deficiency anemia and takes iron supplement daily. We'll continue Protonix empirically given that she is on chronic Coumadin.  Acute left wrist fracture status post fall in the hospital. Dr. Mort Sawyers recommendation is noted and appreciated. He recommended non-operative treatment with a splint for 6 weeks. Followup x-ray in 7 weeks.  Social issues/: Astronomer. The patient realizes that she may not be able to take care of herself and her disabled daughter given the inability to use her left upper extremity for 6 weeks.  She has agreed to skilled nursing facility placement for rehabilitation.    Plan:  1.Add a prednisone taper. 2. We'll check a followup proBNP and chest x-ray. 3. Lasix changed to 60 twice a day by mouth.  Time spent: 20 minutes.    [facility]  [facility] Pager [phone]. If 7PM-7AM, please contact night-coverage at [email or web address], password [facility] 06/15/2013, 12:41 PM  LOS: 10 days

## 2013-06-16 LAB — PROTIME-INR: Prothrombin Time: 26 seconds — ABNORMAL HIGH (ref 11.6–15.2)

## 2013-06-16 LAB — BASIC METABOLIC PANEL
CO2: 28 mEq/L (ref 19–32)
Calcium: 9.4 mg/dL (ref 8.4–10.5)
Chloride: 99 mEq/L (ref 96–112)
Creatinine, Ser: 1.51 mg/dL — ABNORMAL HIGH (ref 0.50–1.10)
Glucose, Bld: 238 mg/dL — ABNORMAL HIGH (ref 70–99)

## 2013-06-16 LAB — GLUCOSE, CAPILLARY: Glucose-Capillary: 298 mg/dL — ABNORMAL HIGH (ref 70–99)

## 2013-06-16 MED ORDER — BUDESONIDE 0.25 MG/2ML IN SUSP: 0.2500 mg | Freq: Two times a day (BID) | RESPIRATORY_TRACT | Status: DC

## 2013-06-16 MED ORDER — INSULIN GLARGINE 100 UNIT/ML ~~LOC~~ SOLN: 15.0000 [IU] | Freq: Every day | SUBCUTANEOUS | Status: DC

## 2013-06-16 MED ORDER — TRAMADOL HCL 50 MG PO TABS: 50.0000 mg | ORAL_TABLET | ORAL | Status: DC | PRN

## 2013-06-16 MED ORDER — NYSTATIN 100000 UNIT/GM EX POWD
Freq: Two times a day (BID) | CUTANEOUS | Status: DC
  Filled 2013-06-16: qty 15

## 2013-06-16 MED ORDER — ENALAPRIL MALEATE 5 MG PO TABS
10.0000 mg | ORAL_TABLET | Freq: Every day | ORAL | Status: DC
  Administered 2013-06-16: 10 mg via ORAL
  Filled 2013-06-16: qty 2

## 2013-06-16 MED ORDER — FUROSEMIDE 10 MG/ML IJ SOLN: 60.0000 mg | Freq: Two times a day (BID) | INTRAMUSCULAR | Status: DC

## 2013-06-16 MED ORDER — PREDNISONE 10 MG PO TABS: ORAL_TABLET | ORAL | Status: DC

## 2013-06-16 MED ORDER — LISINOPRIL 10 MG PO TABS: 10.0000 mg | ORAL_TABLET | Freq: Every day | ORAL | Status: DC

## 2013-06-16 MED ORDER — ALBUTEROL SULFATE (5 MG/ML) 0.5% IN NEBU: 2.5000 mg | INHALATION_SOLUTION | Freq: Four times a day (QID) | RESPIRATORY_TRACT | Status: DC

## 2013-06-16 MED ORDER — FUROSEMIDE 10 MG/ML IJ SOLN
80.0000 mg | Freq: Two times a day (BID) | INTRAMUSCULAR | Status: DC
  Administered 2013-06-16: 80 mg via INTRAVENOUS
  Filled 2013-06-16: qty 8

## 2013-06-16 MED ORDER — GLIPIZIDE 5 MG PO TABS: 5.0000 mg | ORAL_TABLET | Freq: Two times a day (BID) | ORAL | Status: DC

## 2013-06-16 NOTE — Progress Notes (Signed)
Dr. Sherrie Mustache notified of the patient refusing to go to SNF stating that she wanted to go home instead so that she can care for her daughter.  I notified her son as she and Dr. Sherrie Mustache asked.  I also notified Dr. Sherrie Mustache of the RT stating that the patients O2 sats reading was 86% room air per RT.  I also notified [place]  the Child psychotherapist.

## 2013-06-16 NOTE — Progress Notes (Signed)
At 1457 RT assessed patient prior to given a Breathing treatment, and patient's SAT's on room air at rest where 86%. RT gave breathing treatment with 2.5mg  albuterol and notified RN of findings. Patient was placed on 2L O2 for SATs of 93-94%, oxygen was removed and RN continued monitoring at this time. RT will also continue to assess and monitor.

## 2013-06-16 NOTE — Clinical Social Work Note (Signed)
Patient ready for discharge today, will transfer to [facility] via [facility]. Patient agreeable, CSW informed both patient and son, Casimiro Needle, about copays (days 1 - 20 = $25, 21 - 49 = $150, 50 - 100 = $0).  Both agreeable.  Facility agreeable to placement, requests patient arrive after 2:30 PM today.  Neither of patient's sons are available to sign preadmission paperwork per patient, asked that daughter in law Leeroy [facility] be asked to complete this.  [facility] admissions attempting to contact her.  FL 2 reviewed w RN and updated as needed.  Discharge summary will be faxed to facility when available.  Discharge packet will be placed w shadow chart for transport.  Discharge process completion will be done by CSW Stultz this afternoon.  Santa Genera, LCSW Clinical Social Worker [phone])

## 2013-06-16 NOTE — Progress Notes (Signed)
Pt ambulated with physical therapy approx. 150 ft.   Her sats ranged 92-94 % on room air.  She was slightly SOB but voiced to complaints or concerns at this time.  Will notify Dr. Sherrie Mustache that the patient does not qualify for home O2.

## 2013-06-16 NOTE — Discharge Summary (Addendum)
Physician Discharge Summary  [name]:[id number] DOB: 06/03/1933 DOA: 06/05/2013  PCP: Cassell Smiles., MD  Admit date: 06/05/2013 Discharge date: 06/16/2013  Time spent: Greater than 30 minutes   Recommendations for Outpatient Follow-up:  1. The left hand splint will need to be left on for 7 weeks. She will need a followup x-ray of her hand then. She will followup with Dr. Hilda Lias on January, 7, 2015 as scheduled.  2. Apply Prevalon boots to the bilateral heels to float them. Apply saline moistened gauze twice daily along with mild compression with an ACE inhibitor to the left lower extremity ulceration daily. Apply saline moistened gauze to the pretibial area of the right leg surrounded by a noncompressive dressing daily. 3. The patient's INR and capillary blood glucose will need to be monitored.  Discharge Diagnoses:  1. Cellulitis and suspected abscess of the left leg, nearly resolved. 2. Residual bilateral pretibial unstageable ulcerations and unstageable heel wounds, treating topically and with dressings. 3. Status post fall in the hospital, resulting in an acute left wrist fracture. Status post splint application. 4. Acute on chronic diastolic heart failure. 5. Acute hypoxic respiratory failure, secondary to diastolic heart failure. Resolved. Her oxygen saturation was 92-94% on room air at the time of discharge. 6. Visual hallucinations, secondary to hypoxemia. Resolved. 7. Chronic atrial fibrillation, on chronic Coumadin therapy. 8. Coronary artery disease. Remained stable. 9. Mild acute renal insufficiency superimposed on stage III chronic kidney disease. 10. Acute bronchitis, resolving. 11. Type 2 diabetes mellitus. 12. Morbid obesity. 13. Chronic anemia.  Discharge Condition: Improved  Diet recommendation: Carbohydrate modified and heart healthy.  Filed Weights   06/14/13 0500 06/15/13 0500 06/16/13 0500  Weight: 117.6 kg (259 lb 4.2 oz) 117.2 kg (258 lb 6.1  oz) 118.5 kg (261 lb 3.9 oz)    History of present illness:  The patient is a 79 year old woman with a history of CAD/CABG, paroxysmal H. her fibrillation on Coumadin, and type 2 diabetes mellitus, who presented to the emergency department on 06/05/2013 with pain, redness, and swelling of left greater than right legs. 3 weeks prior, she had underwent I&D of her leg in the ER and was discharged home on doxycycline. Her primary physician then started her on Augmentin when the leg did not improve. When she presented to the emergency department, she was afebrile and hemodynamically stable. Her white blood cell count was elevated at 14.9. Her sodium was slightly low at 132. Her INR was therapeutic at 2.0. She was admitted for further evaluation and management.  Hospital Course:    1. Cellulitis and suspected abscess of the left leg. Mild right lower extremity cellulitis and edema. Unstageable bilateral heel wounds. The patient was started on vancomycin and Zosyn. Supportive treatment was given. Orthopedic surgeon Dr. Hilda Lias was consulted. He agreed with medical management and recommended elevating her legs and weightbearing as tolerated. The CT of her left leg was ordered for further evaluation. There were no CT findings for myofascial tightness or osteomyelitis, but there were findings of diffuse cellulitic changes without soft tissue abscess. The wound care nurse was consulted. She recommended dressing changes and Prevalon boots to offload pressure from the heels. She also recommended hydrotherapy which was performed for several days. Overall, her legs have improved significantly. They do still have a few areas of mild erythema, but no drainage. She received a total of 10 days of IV antibiotic therapy. She was afebrile and her white blood cell count was within normal limits at the time  of discharge.  Visual hallucinations. The patient complained of seeing bugs crawling on the walls in her bed. There was no  evidence of this. For further evaluation, a number of studies were ordered for evaluation. Her ABG on room air on 06/12/2013 revealed a pH of 7.4, PCO2 of 33, and PO2 of 59. She was started on oxygen. Her vitamin B12 level was within normal limits. Her TSH was within normal limits. Her visual hallucinations resolved when her hypoxemia resolved.   Acute hypoxic respiratory failure.  In evaluation for confusion, an ABG was ordered. On 06/12/2013, it revealed a pH of 7.4/PCO2 of 33/PO2 of 59. The patient was started on oxygen. She was not treated with oxygen chronically. The hypoxemia was thought to be secondary to acute on chronic diastolic heart failure. As of today, she is oxygenating between 92-94% on room air. Therefore, supplemental oxygen was discontinued.   Acute on chronic diastolic heart failure.  On admission, she was started on IV fluid hydration. She was also being given IV fluids with antibiotics. Lasix was temporarily held. However, the patient became more edematous peripherally and she developed some pulmonary crackles. Her chest x-ray revealed pulmonary edema. Her pro BNP was greater than 14,000. IV fluids were discontinued and she was started on IV Lasix. Including today for which she has approximately 1500 cc of urine in her Foley bag, she is diuresed approximately 3 L. Her proBNP decreased to a little over 7000. She is clearly less edematous and has few or pulmonary crackles on exam. The Foley catheter was discontinued and she was restarted on 80 mg of oral Lasix twice a day.   Superimposed acute bronchitis. Albuterol and Atrovent nebulizers were given. She was given a short course of prednisone. Overall, her symptoms have improved.  Chronic atrial fibrillation. Her heart rate remained controlled on amiodarone and metoprolol. Her INR remained therapeutic throughout the hospitalization.   Acute renal failure superimposed on stage III chronic kidney disease.  Initially she was given IV  fluid hydration and Lasix was withheld. However, she developed acute on chronic diastolic dysfunction and IV Lasix had to be resumed. Her creatinine had improved, but had been waxing and waning in light of IV Lasix.Marland Kitchen Her baseline creatinine is approximately 1.6-1.7. Enalapril was temporarily withheld, but was restarted upon discharge when her creatinine improved to 1.5.   Type 2 diabetes mellitus.  Her capillary blood glucose waxed and waned, but overall, was fairly well controlled on sliding scale NovoLog and Lantus during hospitalization. Glipizide had been withheld. However, it was restarted at the time of discharge. Her hemoglobin A1c was 9.7. She was not on insulin at home, but with glipizide only.  She was discharged on Lantus in addition to glipizide.   Normocytic anemia.  Her anemia is likely chronic. She has a history of iron deficiency anemia and takes iron supplement daily. She was maintained on iron supplementation and Protonix prophylactically.   Acute left wrist fracture status post fall in the hospital. Orthopedic surgeon, Dr. Romeo Apple was consulted when the x-ray of her left hand revealed a fracture.  He recommended non-operative treatment with a splint for 6 weeks. Followup x-ray in 7 weeks.   Deconditioning.  Physical therapist evaluated the patient and recommended short-term skilled nursing facility placement. Initially, the patient refused, but eventually agreed. She will be discharged today to [facility] nursing facility.        Procedures:   application of left hand splint.    hydrotherapy to left leg.  Consultations:  Orthopedic surgeon, Dr. Fuller Canada.   Discharge Exam: Filed Vitals:   06/16/13 0500  BP: 139/66  Pulse: 94  Temp: 97.5 F (36.4 C)  Resp: 16    OXYGEN saturation 87-90% on room air and 98% on 2 L of nasal cannula oxygen.   General: Pleasant obese 79 year old Caucasian in no acute distress. Cardiovascular: Irregular, irregular with a  soft systolic murmur. Respiratory: A few scattered upper airway wheezes and crackles. Breathing is nonlabored. Abdomen: Obese, positive bowel sounds, soft, nontender, nondistended. Musculoskeletal: Trace nonpitting bilateral lower extremity edema. Left leg with mild  erythema (significantly decreased from admission per physical therapist). Left hand/wrist with a splint in place. She is able to wiggle her fingers. Neurologic: She is alert and oriented x2. Cranial nerves II through XII are intact. She denies visual hallucinations currently.    Discharge Instructions      Discharge Orders   Future Appointments Provider Department Dept Phone   08/06/2013 9:30 AM Vickki Hearing, MD [facility] [phone]   Future Orders Complete By Expires   Diet - low sodium heart healthy  As directed    Diet Carb Modified  As directed    Discharge instructions  As directed    Comments:     Leave the left upper extremity splint on for 7 weeks. She will need a followup x-ray of her wrist then. She will need a followup with orthopedic surgeon Dr. Romeo Apple in 7-8 weeks.   Discharge wound care:  As directed    Comments:     See discharge summary.   Increase activity slowly  As directed        Medication List    STOP taking these medications       amoxicillin-clavulanate 875-125 MG per tablet  Commonly known as:  AUGMENTIN     GLIPIZIDE XL 10 MG 24 hr tablet  Generic drug:  glipiZIDE  Replaced by:  glipiZIDE 5 MG tablet      TAKE these medications       albuterol (5 MG/ML) 0.5% nebulizer solution  Commonly known as:  PROVENTIL  Take 0.5 mLs (2.5 mg total) by nebulization every 6 (six) hours.     allopurinol 100 MG tablet  Commonly known as:  ZYLOPRIM  Take 100 mg by mouth every morning.     amiodarone 200 MG tablet  Commonly known as:  PACERONE  Take 200 mg by mouth every morning.     aspirin 81 MG tablet  Take 162 mg by mouth daily.     budesonide 0.25  MG/2ML nebulizer solution  Commonly known as:  PULMICORT  Take 2 mLs (0.25 mg total) by nebulization 2 (two) times daily.     citalopram 10 MG tablet  Commonly known as:  CELEXA  Take 10 mg by mouth every morning.     enalapril 10 MG tablet  Commonly known as:  VASOTEC  Take 10 mg by mouth every morning.     folic acid 1 MG tablet  Commonly known as:  FOLVITE  Take 1 mg by mouth 2 (two) times daily.     furosemide 80 MG tablet  Commonly known as:  LASIX  Take 1 tablet (80 mg total) by mouth 2 (two) times daily.     glipiZIDE 5 MG tablet  Commonly known as:  GLUCOTROL  Take 1 tablet (5 mg total) by mouth 2 (two) times daily before a meal.     insulin glargine 100 UNIT/ML injection  Commonly  known as:  LANTUS  Inject 0.15 mLs (15 Units total) into the skin at bedtime.     iron polysaccharides 150 MG capsule  Commonly known as:  NIFEREX  Take 150 mg by mouth 2 (two) times daily.     levothyroxine 125 MCG tablet  Commonly known as:  SYNTHROID, LEVOTHROID  Take 125 mcg by mouth every morning.     magnesium oxide 400 MG tablet  Commonly known as:  MAG-OX  Take 400 mg by mouth every morning.     metoprolol succinate 25 MG 24 hr tablet  Commonly known as:  TOPROL-XL  Take 1 tablet by mouth at bedtime.     oxybutynin 5 MG tablet  Commonly known as:  DITROPAN  Take 5 mg by mouth 2 (two) times daily.     pantoprazole 40 MG tablet  Commonly known as:  PROTONIX  Take 40 mg by mouth every morning.     potassium chloride SA 20 MEQ tablet  Commonly known as:  K-DUR,KLOR-CON  Take 1 tablet (20 mEq total) by mouth 2 (two) times daily.     pravastatin 40 MG tablet  Commonly known as:  PRAVACHOL  Take 40 mg by mouth daily.     predniSONE 10 MG tablet  Commonly known as:  DELTASONE  Starting tomorrow, take 5 tablets for 1 day; then 4 tablets next day; then 3 tablets next day; then 2 tablets next day; then 1 tablet the next day; then stop.     traMADol 50 MG tablet   Commonly known as:  ULTRAM  Take 1 tablet (50 mg total) by mouth every 4 (four) hours as needed for moderate pain.     warfarin 5 MG tablet  Commonly known as:  COUMADIN  Take 2.5-5 mg by mouth daily. Patient takes 1 tablet(5mg )on Tues.and Thurs. And 1/2 tablet(2.5mg )all other days       No Known Allergies Follow-up Information   Follow up with Fuller Canada, MD On 08/06/2013. (at 9:15 am)    Specialties:  Orthopedic Surgery, Radiology   Contact information:   [address] [place] [address] [phone]        The results of significant diagnostics from this hospitalization (including imaging, microbiology, ancillary and laboratory) are listed below for reference.    Significant Diagnostic Studies: Dg Chest 2 View  06/11/2013   CLINICAL DATA:  Weakness, lower extremity edema and congestion.  EXAM: CHEST - 2 VIEW  COMPARISON:  11/19/2012  FINDINGS: Underlying chronic lung disease again noted with diffuse interstitial prominence and some pulmonary vascular prominence identified. A component of acute interstitial edema is suspected when reviewing multiple older chest x-rays. The heart is enlarged. There is a stable appearance to a pacemaker. No pleural effusions are identified. There is stable osteopenia and degenerative disease of the thoracic spine.  IMPRESSION: Interstitial edema superimposed on chronic lung disease.   Electronically Signed   By: Irish Lack M.D.   On: 06/11/2013 20:49   Dg Wrist 2 Views Left  06/12/2013   CLINICAL DATA:  Fall, pain.  EXAM: LEFT WRIST - 2 VIEW  COMPARISON:  None.  FINDINGS: There is an intra-articular fracture through the distal left radius. Minimally displaced fracture fragments. Ulnar styloid fracture noted. No significant angulation.  IMPRESSION: Minimally displaced intra-articular fracture of the distal left radius. Ulnar styloid fracture.   Electronically Signed   By: Charlett Nose M.D.   On:  06/12/2013 17:17   Ct Tibia  Fibula Left Wo Contrast  06/06/2013   CLINICAL DATA:  Left lower extremity pain and swelling.  EXAM: CT TIBIA FIBULA LEFT WITHOUT CONTRAST  TECHNIQUE: Standard CT imaging of the left lower extremity was performed with coronal and sagittal reformatted images without contrast.  COMPARISON:  Radiographs 08/01/2010.  FINDINGS: There is a a remote ununited medial tibial plateau fracture along with moderate knee joint degenerative changes. A knee joint effusion is noted.  The tibia and fibula are otherwise intact. No fracture, bone lesion or destructive bony changes to suggest osteomyelitis.  There is diffuse subcutaneous soft tissue swelling/ edema and fluid which is fairly extensive. Findings suggest cellulitis. No gas is seen in the soft tissues and I do not see an obvious discrete drainable soft tissue abscess. No findings to suggest myofasciitis or pyomyositis. Extensive small vessel calcifications are noted.  IMPRESSION: Severe and diffuse cellulitic change without obvious drainable soft tissue abscess.  No CT findings for myofasciitis or osteomyelitis.  Knee joint degenerative changes and remote ununited medial tibial plateau fracture.   Electronically Signed   By: Loralie Champagne M.D.   On: 06/06/2013 10:15   Dg Chest Port 1 View  06/15/2013   CLINICAL DATA:  Short of breath.  FOLLOW UP edema.  EXAM: PORTABLE CHEST - 1 VIEW  COMPARISON:  06/11/2013  FINDINGS: Irregular interstitial thickening with intervening areas of hazy opacity are noted in the lungs. The hazy intervening opacity appears increased from the prior study. This is most notable in the upper lobes.  Change cardiac surgery are stable. The cardiac silhouette is mildly enlarged.  IMPRESSION: 1. Mild increased hazy opacity mostly in the upper lobes with relatively unchanged. The combination of findings suggest worsened congestive heart failure.   Electronically Signed   By: Amie Portland M.D.   On: 06/15/2013 13:11     Microbiology: No results found for this or any previous visit (from the past 240 hour(s)).   Labs: Basic Metabolic Panel:  Recent Labs Lab 06/11/13 0601 06/13/13 0627 06/14/13 0632 06/15/13 0451 06/16/13 0515  NA 137 135 136 136 137  K 4.3 4.5 3.9 3.9 4.2  CL 100 100 98 98 99  CO2 26 25 29 30 28   GLUCOSE 177* 148* 120* 158* 238*  BUN 31* 26* 26* 26* 25*  CREATININE 1.77* 1.68* 1.81* 1.76* 1.51*  CALCIUM 9.1 9.2 9.2 9.1 9.4   Liver Function Tests: No results found for this basename: AST, ALT, ALKPHOS, BILITOT, PROT, ALBUMIN,  in the last 168 hours No results found for this basename: LIPASE, AMYLASE,  in the last 168 hours  Recent Labs Lab 06/12/13 0922  AMMONIA 66*   CBC:  Recent Labs Lab 06/10/13 0540 06/11/13 0601 06/12/13 0557  WBC 9.8 9.7 10.3  HGB 8.9* 9.4* 9.5*  HCT 27.9* 29.8* 30.1*  MCV 88.6 88.2 88.3  PLT 227 253 290   Cardiac Enzymes: No results found for this basename: CKTOTAL, CKMB, CKMBINDEX, TROPONINI,  in the last 168 hours BNP: BNP (last 3 results)  Recent Labs  06/11/13 0601 06/13/13 0627 06/15/13 1300  PROBNP 14392.0* 17227.0* 7247.0*   CBG:  Recent Labs Lab 06/15/13 0818 06/15/13 1146 06/15/13 1702 06/15/13 2112 06/16/13 0737  GLUCAP 141* 173* 193* 215* 218*       Signed:  Brizeida Mcmurry  [facility] 06/16/2013, 12:12 PM

## 2013-06-16 NOTE — Clinical Social Work Note (Signed)
Patient now wants SNF placement for rehab, realizes she cannot return home at present.  Gave bed offers, wanted [facility] but no bed available at that SNF.  Gave choice of [facility], [facility].  [facility], admissions notified, is verifying current bed availability at facility.  Santa Genera, LCSW Clinical Social Worker [phone])

## 2013-06-16 NOTE — Progress Notes (Signed)
O2 was rechecked on room air prior to discharge and the sats ranged from 90-91% room air.  She has no complaints at this time.  Pt left the floor via strecher to be transferred to [facility] in stable condition.

## 2013-06-16 NOTE — Progress Notes (Signed)
Physical Therapy Treatment    Subjective:  Pt states she is ready to get out of bed.  States her Lt anterior leg is a little sore to the touch.   Noted with uneven compression marks left from ACE as only wrapped from ankle to mid calf.   Objective: Wound Assessment    Wound 06/05/13 Other (Comment) Leg Left Left lower leg cellulitis with several open draining wounds, serous fluid noted (Active)  Site / Wound Assessment Pink;Yellow 06/16/2013  3:00 PM  % Wound base Red or Granulating 75% 06/16/2013  3:00 PM  % Wound base Yellow 25% 06/16/2013  3:00 PM  Wound Length (cm) 2.3 cm 06/13/2013  2:44 PM  Wound Width (cm) 2 cm 06/13/2013  2:44 PM  Wound Depth (cm) 0.1 cm 06/13/2013  2:44 PM  Tunneling (cm) 0 06/13/2013  2:44 PM  Undermining (cm) 0 06/13/2013  2:44 PM  Drainage Amount Scant 06/16/2013  3:00 PM  Drainage Description Serosanguineous 06/16/2013  3:00 PM  Treatment Cleansed;Debridement (Selective) 06/16/2013  3:00 PM  Dressing Type ABD;Gauze (Comment);Moist to dry 06/16/2013  3:00 PM  Dressing Changed Changed 06/16/2013  3:00 PM  Dressing Status Clean;Dry;Intact 06/16/2013  3:00 PM        PT Assessment / Plan / Recommendation  Pt Assesment:  Wound healing nicely on Lt LE.  Slough easily debrided and now 75% granulated. Pt able to increase ambulation distance to 150 feet today.       Follow Up Recommendations  SNF     Equipment Recommendations  Other (comment) (left platform walker)    Plan  Discharge plan needs to be updated           Mobility  Bed Mobility Bed Mobility: Supine to Sit Supine to Sit: 5: Supervision;HOB elevated Details for Bed Mobility Assistance: as long as HOB is fully elevated, pt does not need any physical assistance to transfer to sitting.  She would be unable to assume this position from a fully supine position due to inablity to use the LUE Transfers Transfers: Sit to Stand Sit to Stand: 5: Supervision;From bed Stand to Sit: 5:  Supervision;To chair/3-in-1 Details for Transfer Assistance: pt instructed to use the RUE only as an assist to stand from sitting. Ambulation/Gait Ambulation/Gait Assistance: 4: Min assist Ambulation Distance (Feet): 150 Feet Assistive device: Left platform walker Gait Pattern: Within Functional Limits;Trunk flexed Gait velocity: slow but steady Stairs: No Wheelchair Mobility Wheelchair Mobility: No     PT Goals (current goals can now be found in the care plan section)    Visit Information  Last PT Received On: 06/16/13    Subjective Data      Cognition  Cognition Arousal/Alertness: Awake/alert Behavior During Therapy: [facility] for tasks assessed/performed Overall Cognitive Status: Within Functional Limits for tasks assessed    Balance     End of Session PT - End of Session Equipment Utilized During Treatment: Gait belt;Other (comment) (left volar wrist splint) Activity Tolerance: Patient tolerated treatment well Patient left: in chair;with call bell/phone within reach;with chair alarm set Nurse Communication: Mobility status   GP     Lurena Nida, PTA/CLT 06/16/2013, 3:36 PM

## 2013-06-16 NOTE — Progress Notes (Signed)
Inpatient Diabetes Program Recommendations  AACE/ADA: New Consensus Statement on Inpatient Glycemic Control (2013)  Target Ranges:  Prepandial:   less than 140 mg/dL      Peak postprandial:   less than 180 mg/dL (1-2 hours)      Critically ill patients:  140 - 180 mg/dL   Results for [name], [name] (MRN [id number]) as of 06/16/2013 09:30  Ref. Range 06/15/2013 08:18 06/15/2013 11:46 06/15/2013 17:02 06/15/2013 21:12 06/16/2013 07:37  Glucose-Capillary Latest Range: 70-99 mg/dL 829 (H) 562 (H) 130 (H) 215 (H) 218 (H)   Inpatient Diabetes Program Recommendations Insulin - Basal: Please consider increasing Lantus to 15 units QHS (while on steroids). Correction - Please consider ordering Novolog bedtime correction scale.  Note: Patient started on steroids yesterday which is likely cause of increase in glucose.  Fasting glucose noted to be 218 mg/dl this morning.  Please consider increasing Lantus to 15 units QHS and ordering Novolog bedtime correction.  Will continue to follow.  Thanks, Orlando Penner, RN, MSN, CCRN Diabetes Coordinator Inpatient Diabetes Program [phone] (Team Pager) [phone] ([facility] office) [phone] [facility] office)

## 2013-06-16 NOTE — Progress Notes (Signed)
Report called to Lauree Chandler at [facility].  She verbalized understanding.  I also talked with the pharmacist from [facility] to discuss the patients coumadin dose orders that she was  On here.  He verbalized understanding.  After discussion with pt, MD, and pt family the patient did eventually decide to be discharged to the to the [facility] nursing center.  Pt left the floor via stretcher with  EMS in stable condition.

## 2013-08-06 ENCOUNTER — Ambulatory Visit: Payer: Medicare Other | Admitting: Orthopedic Surgery

## 2013-08-19 ENCOUNTER — Ambulatory Visit (INDEPENDENT_AMBULATORY_CARE_PROVIDER_SITE_OTHER): Payer: Medicare HMO

## 2013-08-19 ENCOUNTER — Encounter: Payer: Self-pay | Admitting: Orthopedic Surgery

## 2013-08-19 ENCOUNTER — Ambulatory Visit (INDEPENDENT_AMBULATORY_CARE_PROVIDER_SITE_OTHER): Payer: Medicare Other | Admitting: Orthopedic Surgery

## 2013-08-19 VITALS — BP 104/53 | Ht 64.0 in | Wt 261.0 lb

## 2013-08-19 DIAGNOSIS — M25532 Pain in left wrist: Secondary | ICD-10-CM

## 2013-08-19 DIAGNOSIS — M25539 Pain in unspecified wrist: Secondary | ICD-10-CM

## 2013-08-19 DIAGNOSIS — S52539A Colles' fracture of unspecified radius, initial encounter for closed fracture: Secondary | ICD-10-CM

## 2013-08-19 DIAGNOSIS — S52532A Colles' fracture of left radius, initial encounter for closed fracture: Secondary | ICD-10-CM | POA: Insufficient documentation

## 2013-08-19 NOTE — Progress Notes (Signed)
Patient ID: [name], female   DOB: 05-Sep-1932, 80 y.o.   MRN: [id number] Chief Complaint  Patient presents with  . Follow-up    Fractured left wrist     Followup from a counselor did in the hospital for a nondisplaced left distal radius fracture treated with a splint. Its approximate 7 weeks since that injury followup x-rays today show fracture healing with some settling of the fracture to be expected. Overall alignment acceptable in this 80 year old female with minimal ambulatory potential at this point  Recommend removal of splint and follow up as needed

## 2013-08-26 ENCOUNTER — Encounter: Payer: Medicare Other | Admitting: Vascular Surgery

## 2013-08-28 ENCOUNTER — Encounter: Payer: Self-pay | Admitting: Vascular Surgery

## 2013-08-29 ENCOUNTER — Ambulatory Visit (INDEPENDENT_AMBULATORY_CARE_PROVIDER_SITE_OTHER): Payer: Medicare HMO | Admitting: Vascular Surgery

## 2013-08-29 ENCOUNTER — Other Ambulatory Visit: Payer: Self-pay | Admitting: *Deleted

## 2013-08-29 ENCOUNTER — Other Ambulatory Visit: Payer: Self-pay | Admitting: Vascular Surgery

## 2013-08-29 ENCOUNTER — Encounter: Payer: Self-pay | Admitting: Vascular Surgery

## 2013-08-29 ENCOUNTER — Ambulatory Visit (HOSPITAL_COMMUNITY)
Admission: RE | Admit: 2013-08-29 | Discharge: 2013-08-29 | Disposition: A | Discharge status: Home / Self Care | Payer: Medicare HMO | Source: Ambulatory Visit | Attending: Vascular Surgery | Admitting: Vascular Surgery

## 2013-08-29 VITALS — BP 132/73 | HR 84 | Ht 64.0 in | Wt 261.0 lb

## 2013-08-29 DIAGNOSIS — I739 Peripheral vascular disease, unspecified: Secondary | ICD-10-CM

## 2013-08-29 DIAGNOSIS — I7025 Atherosclerosis of native arteries of other extremities with ulceration: Secondary | ICD-10-CM | POA: Insufficient documentation

## 2013-08-29 DIAGNOSIS — L97429 Non-pressure chronic ulcer of left heel and midfoot with unspecified severity: Secondary | ICD-10-CM

## 2013-08-29 DIAGNOSIS — L97409 Non-pressure chronic ulcer of unspecified heel and midfoot with unspecified severity: Secondary | ICD-10-CM

## 2013-08-29 DIAGNOSIS — L989 Disorder of the skin and subcutaneous tissue, unspecified: Secondary | ICD-10-CM | POA: Insufficient documentation

## 2013-08-29 DIAGNOSIS — L98499 Non-pressure chronic ulcer of skin of other sites with unspecified severity: Principal | ICD-10-CM

## 2013-08-29 NOTE — Progress Notes (Signed)
Referred by:  Neale Burly, MD [address] [place], [place] [address]  Reason for referral: Bilateral heel ulcers  History of Present Illness  [name] is a 80 y.o. (1933-02-14) female who presents with chief complaint: left heel ulcer.  Onset of symptom occurred, per patient one month ago.  She woke up with severe pins and needle sensation in L heel.  She was found to have bilateral heel ulcers and was sent to Wound care for management.  She had debridement and wound care with Dr.  Nils Pyle.  At this point, her right heel is nearly healed but the left heel is undergoing VAC therapy.  Pain is described as sharp, severity 3-6/10, and associated with manipulting left heel.  Patient has attempted to treat this pain with rest.  The patient has no rest pain symptoms.  The patient's ambulation is limited so she has never gotten intermittent claudication.  Atherosclerotic risk factors include: DM, HTN, and hyperlipidemia.  Past Medical History  Diagnosis Date  . Anemia 2008  . DM (diabetes mellitus) 1999  . Cataract     BIL  . Atrial fibrillation   . CAD (coronary artery disease)   . CRI (chronic renal insufficiency) 2008  . Hypertension   . Hyperlipemia   . GERD (gastroesophageal reflux disease)   . Arthritis   . Acute respiratory failure with hypoxia 06/12/2013    Secondary to acute on chronic diastolic failure.  . Wrist fracture, left 06/13/2013  . Acute on chronic diastolic heart failure [id number]    Past Surgical History  Procedure Laterality Date  . Esophagogastroduodenoscopy  11/07/2007    [id number] Schatzki's ring.  Otherwise normal esophagus   . Appendectomy      APPENDICITIS  . Coronary artery bypass graft  MAR 2009  . Pacemaker insertion  MAR 2009  . Total knee arthroplasty Right     2012  . Eye surgery Left   . Coronary angioplasty    . Back surgery    . Tonsillectomy      age 74  . Insert / replace / remove pacemaker    . Joint replacement Right   .  Colonoscopy with esophagogastroduodenoscopy (egd) N/A 11/25/2012    SLF:Two SMALL AC polyps/Small internal hemorrhoids/The colon IS SLIGHTLY redundant-EGD:Schatzki ring at the gastroesophageal junction/Small hiatal hernia/MILD Non-erosive gastritis    History   Social History  . Marital Status: Widowed    Spouse Name: N/A    Number of Children: N/A  . Years of Education: N/A   Occupational History  . Not on file.   Social History Main Topics  . Smoking status: Never Smoker   . Smokeless tobacco: Not on file  . Alcohol Use: No  . Drug Use: No  . Sexual Activity: No   Other Topics Concern  . Not on file   Social History Narrative  . No narrative on file    Family History  Problem Relation Age of Onset  . Colon cancer Neg Hx   . Colon polyps Neg Hx   . Cancer - Other Mother     hogkins disease  . Diabetes Other     Current Outpatient Prescriptions on File Prior to Visit  Medication Sig Dispense Refill  . albuterol (PROVENTIL) (5 MG/ML) 0.5% nebulizer solution Take 0.5 mLs (2.5 mg total) by nebulization every 6 (six) hours.      Marland Kitchen allopurinol (ZYLOPRIM) 100 MG tablet Take 100 mg by mouth every morning.       Marland Kitchen  amiodarone (PACERONE) 200 MG tablet Take 200 mg by mouth every morning.       Marland Kitchen aspirin 81 MG tablet Take 162 mg by mouth daily.       . budesonide (PULMICORT) 0.25 MG/2ML nebulizer solution Take 2 mLs (0.25 mg total) by nebulization 2 (two) times daily.      . citalopram (CELEXA) 10 MG tablet Take 10 mg by mouth every morning.       . enalapril (VASOTEC) 10 MG tablet Take 10 mg by mouth every morning.       . folic acid (FOLVITE) 1 MG tablet Take 1 mg by mouth 2 (two) times daily.      . furosemide (LASIX) 80 MG tablet Take 1 tablet (80 mg total) by mouth 2 (two) times daily.  60 tablet  0  . glipiZIDE (GLUCOTROL) 5 MG tablet Take 1 tablet (5 mg total) by mouth 2 (two) times daily before a meal.      . insulin glargine (LANTUS) 100 UNIT/ML injection Inject 0.15 mLs  (15 Units total) into the skin at bedtime.      . iron polysaccharides (NIFEREX) 150 MG capsule Take 150 mg by mouth 2 (two) times daily.      Marland Kitchen levothyroxine (SYNTHROID, LEVOTHROID) 125 MCG tablet Take 125 mcg by mouth every morning.       . magnesium oxide (MAG-OX) 400 MG tablet Take 400 mg by mouth every morning.       . metoprolol succinate (TOPROL-XL) 25 MG 24 hr tablet Take 1 tablet by mouth at bedtime.      Marland Kitchen oxybutynin (DITROPAN) 5 MG tablet Take 5 mg by mouth 2 (two) times daily.       . pantoprazole (PROTONIX) 40 MG tablet Take 40 mg by mouth every morning.       . potassium chloride SA (K-DUR,KLOR-CON) 20 MEQ tablet Take 1 tablet (20 mEq total) by mouth 2 (two) times daily.  60 tablet  0  . pravastatin (PRAVACHOL) 40 MG tablet Take 40 mg by mouth daily.       . predniSONE (DELTASONE) 10 MG tablet Starting tomorrow, take 5 tablets for 1 day; then 4 tablets next day; then 3 tablets next day; then 2 tablets next day; then 1 tablet the next day; then stop.      . traMADol (ULTRAM) 50 MG tablet Take 1 tablet (50 mg total) by mouth every 4 (four) hours as needed for moderate pain.  30 tablet  0  . warfarin (COUMADIN) 5 MG tablet Take 2.5-5 mg by mouth daily. Patient takes 1 tablet(5mg )on Tues.and Thurs. And 1/2 tablet(2.5mg )all other days       No current facility-administered medications on file prior to visit.    No Known Allergies   REVIEW OF SYSTEMS:  (Positives checked otherwise negative)  CARDIOVASCULAR:  []  chest pain, []  chest pressure, []  palpitations, []  shortness of breath when laying flat, [x]  shortness of breath with exertion,  [x]  pain in feet when walking, [x]  pain in feet when laying flat, []  history of blood clot in veins (DVT), []  history of phlebitis, [x]  swelling in legs, []  varicose veins  PULMONARY:  []  productive cough, []  asthma, []  wheezing  NEUROLOGIC:  [x]  weakness in arms or legs, []  numbness in arms or legs, []  difficulty speaking or slurred speech, []   temporary loss of vision in one eye, []  dizziness  HEMATOLOGIC:  []  bleeding problems, []  problems with blood clotting too easily  MUSCULOSKEL:  []  joint  pain, []  joint swelling  GASTROINTEST:  []  vomiting blood, []  blood in stool     GENITOURINARY:  []  burning with urination, []  blood in urine  PSYCHIATRIC:  []  history of major depression  INTEGUMENTARY:  []  rashes, [x]  ulcers  CONSTITUTIONAL:  []  fever, []  chills  For VQI Use Only  PRE-ADM LIVING: Nursing home  AMB STATUS: Wheelchair  CAD Sx: History of MI, but no symptoms No MI within 6 months  PRIOR CHF: None  STRESS TEST: [x]  No, [ ]  Normal, [ ]  + ischemia, [ ]  + MI, [ ]  Both  Physical Examination  Filed Vitals:   08/29/13 0841  BP: 132/73  Pulse: 84  Height: 5\' 4"  (1.626 m)  Weight: 261 lb (118.389 kg)  SpO2: 98%   Body mass index is 44.78 kg/(m^2).  General: A&O x 3, WD, morbidly obese  Head: [place]/AT  Ear/Nose/Throat: Hearing grossly intact, nares w/o erythema or drainage, oropharynx w/o Erythema/Exudate, Mallampati score: 3  Eyes: PERRLA, EOMI  Neck: Supple, no nuchal rigidity, no palpable LAD  Pulmonary: Sym exp, good air movt, CTAB, no rales, rhonchi, & wheezing  Cardiac: RRR, Nl S1, S2, no Murmurs, rubs or gallops  Vascular: Vessel Right Left  Radial Faintly Palpable Palpable  Brachial Faintly Palpable Palpable  Carotid Palpable, without bruit Palpable, without bruit  Aorta Not palpable N/A  Femoral Not Palpable due to pannus Not Palpable due to pannus  Popliteal Not palpable Not palpable  PT Not Palpable due to edema Not Palpable due to edema  DP Faintly Palpable Faintly Palpable   Gastrointestinal: soft, NTND, -G/R, - HSM, - masses, - CVAT B, cannot palpate aorta due to obesity  Musculoskeletal: M/S 5/5 throughout , RLE: cyanotic toes, nearly healed ulcer overlying achilles, LLE: heel ulcer with granulation tissue, +odor, cyanotic toes, BLE: venous stasis derrmatitis changes, +2  edema  Neurologic: CN 2-12 intact , Pain and light touch intact in extremities , Motor exam as listed above  Psychiatric: Judgment intact, Mood & affect appropriate for pt's clinical situation  Dermatologic: See M/S exam for extremity exam, no rashes otherwise noted  Lymph : No Cervical, Axillary, or Inguinal lymphadenopathy   Non-Invasive Vascular Imaging  ABI (Date: 08/29/2013)  R: [place], DP: tri, PT: bi, TBI: 0.70  L: [place], DP: mono, PT: mono, TBI: 0.46  Outside Studies/Documentation 10 pages of outside documents were reviewed including: outside venous studies and wound clinic notes.  Medical Decision Making  [name] is a 80 y.o. female who presents with: BLE critical limb ischemia, BLE chronic venous insufficiency , chronic kidney disease stage III-IV    I suspect the outside venous reflux study is incorrect as the patient's clinical exam is consistent with BLE CVI.  I suspect her cyanosis in BOTH feet is partially venous disease.  Waveforms in the left leg are consistent with mod-severe PAD, but the patient is nearly stage IV in regards to her CKD (GFR ~30), so angiography possibly could result in her becoming ESRD.  I recommended that she continue wound care with her wound care clinic.   If she fails to heal within another month at that point, I would consider: Aortogram with Carbon dioxide, Left leg runoff with carbon dioxide and contrast dye, and possible intervention.  This patient is not a good surgical candidate as I suspect any surgical intervention would be at risk for significant complications.  I discussed in depth with the patient the nature of atherosclerosis, and emphasized the importance of maximal medical management  including strict control of blood pressure, blood glucose, and lipid levels, antiplatelet agents, obtaining regular exercise, and cessation of smoking.  The patient is aware that without maximal medical management the underlying atherosclerotic  disease process will progress, limiting the benefit of any interventions. The patient is currently on a statin:  Pravachol. The patient is currently on an anti-platelet: ASA.  Thank you for allowing [place] to participate in this patient's care.  Adele Barthel, MD [facility] Office: [phone] Pager: [phone]  08/29/2013, 8:59 AM

## 2013-09-01 NOTE — Addendum Note (Signed)
Addended by: Mena Goes on: 09/01/2013 05:44 PM   Modules accepted: Orders

## 2013-09-02 ENCOUNTER — Encounter: Payer: Self-pay | Admitting: Cardiovascular Disease

## 2013-10-03 ENCOUNTER — Encounter (HOSPITAL_COMMUNITY): Payer: Medicare Other

## 2013-10-03 ENCOUNTER — Ambulatory Visit: Payer: Medicare Other | Admitting: Vascular Surgery

## 2013-10-16 ENCOUNTER — Encounter: Payer: Self-pay | Admitting: Vascular Surgery

## 2013-10-17 ENCOUNTER — Encounter: Payer: Self-pay | Admitting: Vascular Surgery

## 2013-10-17 ENCOUNTER — Ambulatory Visit (INDEPENDENT_AMBULATORY_CARE_PROVIDER_SITE_OTHER): Payer: Medicare HMO | Admitting: Vascular Surgery

## 2013-10-17 ENCOUNTER — Ambulatory Visit (HOSPITAL_COMMUNITY)
Admission: RE | Admit: 2013-10-17 | Discharge: 2013-10-17 | Disposition: A | Discharge status: Home / Self Care | Payer: Medicare HMO | Source: Ambulatory Visit | Attending: Vascular Surgery | Admitting: Vascular Surgery

## 2013-10-17 VITALS — BP 161/70 | HR 65 | Temp 98.2°F | Ht 64.0 in | Wt 261.0 lb

## 2013-10-17 DIAGNOSIS — I739 Peripheral vascular disease, unspecified: Secondary | ICD-10-CM

## 2013-10-17 DIAGNOSIS — I83009 Varicose veins of unspecified lower extremity with ulcer of unspecified site: Secondary | ICD-10-CM | POA: Insufficient documentation

## 2013-10-17 DIAGNOSIS — L97909 Non-pressure chronic ulcer of unspecified part of unspecified lower leg with unspecified severity: Secondary | ICD-10-CM

## 2013-10-17 DIAGNOSIS — L98499 Non-pressure chronic ulcer of skin of other sites with unspecified severity: Principal | ICD-10-CM

## 2013-10-17 DIAGNOSIS — Z48812 Encounter for surgical aftercare following surgery on the circulatory system: Secondary | ICD-10-CM

## 2013-10-17 NOTE — Progress Notes (Signed)
Established Critical Limb Ischemia Patient  History of Present Illness  [name] is a 80 y.o. (09-21-1932) female who presents with chief complaint: healing uclers.  The patient has no rest pain and wounds include: bilateral achilles ulcers and new left shin ulcers.  The patient notes symptoms have not progressed.  The patient's treatment regimen currently included: maximal medical management and VAC dressing.  The patient's PMH, PSH, SH, FamHx, Med, and Allergies are unchanged from 08/29/13.  On ROS today: No rest pain, no fever or chills  Physical Examination  Filed Vitals:   10/17/13 1019  BP: 161/70  Pulse: 65  Temp: 98.2 F (36.8 C)  TempSrc: Oral  Height: 5\' 4"  (1.626 m)  Weight: 261 lb (118.389 kg)  SpO2: 95%   Body mass index is 44.78 kg/(m^2).  General: A&O x 3, WD, morbidly obese  Pulmonary: Sym exp, good air movt, CTAB, no rales, rhonchi, & wheezing  Cardiac: RRR, Nl S1, S2, no Murmurs, rubs or gallops  Vascular: Vessel Right Left  Radial Palpable Palpable  Brachial Palpable Palpable  Carotid Palpable, without bruit Palpable, without bruit  Aorta Not palpable N/A  Femoral Not Palpable due to pannus Not Palpable due to pannus  Popliteal Not palpable Not palpable  PT Not Palpable Not Palpable  DP Not Palpable Not Palpable   Gastrointestinal: soft, NTND, -G/R, - HSM, - masses, - CVAT B  Musculoskeletal: M/S 5/5 throughout , B edema 2+, R achilles ulcer: contracted and nearly healed, good granulation, L achilles ulcer: clean, contracted, good granulation, new small VSU L shin  Neurologic: Pain and light touch intact in extremities except decreased in feet, Motor exam as listed above  Non-Invasive Vascular Imaging ABI (Date: 10/17/2013)  R: [place], DP: tri, PT: mono, TBI: 0.61  L: [place], DP: mono (improved signal), PT: mono, TBI: 0.31  Medical Decision Making  [name] is a 80 y.o. female who presents with: B achilles ulcers, LLE VSU, LLE  critical limb ischemia , BLE chronic venous insufficiency (c6), CKD IV   Pt should be able to heal R leg with some compression to treat the RLE CVI.  The left leg will need compression once she heals her ulcers.  I would continue with wound care.  Ulcer has contracted enough that I think the VAC can be discontinued, but I'll defer to the wound care center that decision.    I continue to think she would be a poor surgical candidate.  With CKD Stage IV, she is a poor endovascular candidate also.  I think her wound healing is adequate to continue with the current regimen and avoid any intervention for now.  I discussed in depth with the patient the nature of atherosclerosis, and emphasized the importance of maximal medical management including strict control of blood pressure, blood glucose, and lipid levels, antiplatelet agents, obtaining regular exercise, and cessation of smoking.    The patient is aware that without maximal medical management the underlying atherosclerotic disease process will progress, limiting the benefit of any interventions. The patient is currently on a statin: Pravachol. The patient is currently on an anti-platelet: ASA.  The steroids will interfere with her wound healing also.  Consider vitamin supplementation including Vitamin A to help with wound healing.  I will recheck her progress in 3 months with ABI.  Thank you for allowing [place] to participate in this patient's care.  Adele Barthel, MD [facility] Office: [phone] Pager: [phone]  10/17/2013, 10:52  AM    

## 2013-10-17 NOTE — Addendum Note (Signed)
Addended by: Dorthula Rue L on: 10/17/2013 06:49 PM   Modules accepted: Orders

## 2013-10-19 ENCOUNTER — Encounter: Payer: Self-pay | Admitting: *Deleted

## 2013-10-23 ENCOUNTER — Ambulatory Visit: Payer: Medicare HMO | Admitting: Internal Medicine

## 2013-10-29 ENCOUNTER — Encounter: Payer: Self-pay | Admitting: Cardiovascular Disease

## 2013-10-29 ENCOUNTER — Ambulatory Visit (INDEPENDENT_AMBULATORY_CARE_PROVIDER_SITE_OTHER): Payer: Medicare HMO | Admitting: Cardiovascular Disease

## 2013-10-29 VITALS — BP 143/85 | HR 71 | Ht 64.0 in | Wt 234.0 lb

## 2013-10-29 DIAGNOSIS — I739 Peripheral vascular disease, unspecified: Secondary | ICD-10-CM

## 2013-10-29 DIAGNOSIS — I5032 Chronic diastolic (congestive) heart failure: Secondary | ICD-10-CM

## 2013-10-29 DIAGNOSIS — Z7901 Long term (current) use of anticoagulants: Secondary | ICD-10-CM

## 2013-10-29 DIAGNOSIS — I25810 Atherosclerosis of coronary artery bypass graft(s) without angina pectoris: Secondary | ICD-10-CM

## 2013-10-29 DIAGNOSIS — Z950 Presence of cardiac pacemaker: Secondary | ICD-10-CM

## 2013-10-29 DIAGNOSIS — I10 Essential (primary) hypertension: Secondary | ICD-10-CM

## 2013-10-29 DIAGNOSIS — I4891 Unspecified atrial fibrillation: Secondary | ICD-10-CM

## 2013-10-29 MED ORDER — WARFARIN SODIUM 3 MG PO TABS: ORAL_TABLET | ORAL | Status: DC

## 2013-10-29 NOTE — Addendum Note (Signed)
Addended by: Laurine Blazer on: 10/29/2013 03:29 PM   Modules accepted: Orders, Medications

## 2013-10-29 NOTE — Patient Instructions (Signed)
Continue all current medications. Device clinic appointment Your physician wants you to follow up in: 6 months.  You will receive a reminder letter in the mail one-two months in advance.  If you don't receive a letter, please call our office to schedule the follow up appointment

## 2013-10-29 NOTE — Progress Notes (Addendum)
Patient ID: [name], female   DOB: Dec 21, 1932, 80 y.o.   MRN: [id number]      SUBJECTIVE: This is a 80 year old woman who I am meeting for the first time today. She has a complex medical and cardiovascular history which includes coronary artery disease and CABG, atrial fibrillation, pacemaker for SSS in 09/2007, severe peripheral vascular disease, chronic diastolic heart failure, chronic venous insufficiency, insulin-dependent diabetes mellitus, hypothyroidism and hyperlipidemia. It appears she underwent 4 vessel CABG in 2009 with a LIMA to the LAD, and saphenous vein grafts to the first diagonal, obtuse marginal, and distal RCA. She had an echocardiogram on 09/02/2012 which revealed an ejection fraction of 55-65% with moderate LVH, mild aortic regurgitation, moderate mitral regurgitation, and moderate left atrial enlargement. She follows with Dr. Bridgett Larsson for her peripheral vascular disease.  She currently denies chest pain, shortness of breath, palpitations, lightheadedness and dizziness. She believes she is taking warfarin. There has been no recent reported history of GI bleeding.     No Known Allergies  Current Outpatient Prescriptions  Medication Sig Dispense Refill  . albuterol (PROVENTIL) (5 MG/ML) 0.5% nebulizer solution Take 0.5 mLs (2.5 mg total) by nebulization every 6 (six) hours.      Marland Kitchen allopurinol (ZYLOPRIM) 100 MG tablet Take 100 mg by mouth every morning.       Marland Kitchen amiodarone (PACERONE) 200 MG tablet Take 200 mg by mouth every morning.       Marland Kitchen aspirin 81 MG tablet Take 162 mg by mouth daily.       . budesonide (PULMICORT) 0.25 MG/2ML nebulizer solution Take 2 mLs (0.25 mg total) by nebulization 2 (two) times daily.      . citalopram (CELEXA) 10 MG tablet Take 10 mg by mouth every morning.       . enalapril (VASOTEC) 10 MG tablet Take 10 mg by mouth every morning.       . folic acid (FOLVITE) 1 MG tablet Take 1 mg by mouth 2 (two) times daily.      Marland Kitchen glipiZIDE (GLUCOTROL) 5 MG  tablet Take 1 tablet (5 mg total) by mouth 2 (two) times daily before a meal.      . insulin glargine (LANTUS) 100 UNIT/ML injection Inject 0.15 mLs (15 Units total) into the skin at bedtime.      . iron polysaccharides (NIFEREX) 150 MG capsule Take 150 mg by mouth 2 (two) times daily.      Marland Kitchen levothyroxine (SYNTHROID, LEVOTHROID) 125 MCG tablet Take 125 mcg by mouth every morning.       . magnesium oxide (MAG-OX) 400 MG tablet Take 400 mg by mouth every morning.       . metoprolol succinate (TOPROL-XL) 25 MG 24 hr tablet Take 1 tablet by mouth at bedtime.      Marland Kitchen oxybutynin (DITROPAN) 5 MG tablet Take 5 mg by mouth 2 (two) times daily.       . pantoprazole (PROTONIX) 40 MG tablet Take 40 mg by mouth every morning.       . potassium chloride SA (K-DUR,KLOR-CON) 20 MEQ tablet Take 1 tablet (20 mEq total) by mouth 2 (two) times daily.  60 tablet  0  . pravastatin (PRAVACHOL) 40 MG tablet Take 40 mg by mouth daily.       . predniSONE (DELTASONE) 10 MG tablet Starting tomorrow, take 5 tablets for 1 day; then 4 tablets next day; then 3 tablets next day; then 2 tablets next day; then 1 tablet the next day;  then stop.      . traMADol (ULTRAM) 50 MG tablet Take 1 tablet (50 mg total) by mouth every 4 (four) hours as needed for moderate pain.  30 tablet  0  . warfarin (COUMADIN) 5 MG tablet Take 2.5-5 mg by mouth daily. Patient takes 1 tablet(5mg )on Tues.and Thurs. And 1/2 tablet(2.5mg )all other days       No current facility-administered medications for this visit.    Past Medical History  Diagnosis Date  . Anemia 2008  . DM (diabetes mellitus) 1999  . Cataract     bilaterla   . Atrial fibrillation   . CAD (coronary artery disease)   . CRI (chronic renal insufficiency) 2008  . Hypertension   . Hyperlipemia   . GERD (gastroesophageal reflux disease)   . Arthritis   . Acute respiratory failure with hypoxia 06/12/2013    secondary to acute on chronic diastolic failure.  . Wrist fracture, left  06/13/2013  . Acute on chronic diastolic heart failure 10/93/2355  . Hx of CABG 10/24/2007    Dr. Roxan Hockey  . S/P CABG x 4   . Venous insufficiency   . Pacemaker 10/24/2007    Medtronic EnRhythm; implanted for SSS, symptomatic bradycardia, PAF    Past Surgical History  Procedure Laterality Date  . Esophagogastroduodenoscopy  11/07/2007    DDU:KGURKY Schatzki's ring.  Otherwise normal esophagus   . Appendectomy      APPENDICITIS  . Coronary artery bypass graft  10/24/2007    LIMA to LAD, SVG to DX1, SVG to OM, SVG to distal RCA (Dr. Roxan Hockey)  . Pacemaker insertion  09/2007  . Total knee arthroplasty Right     2012  . Eye surgery Left   . Coronary angioplasty  11/16/2003    PCI to prox mid RCA (2.75x35mm chromium cobalt vision Guidant stent) (Dr. Marella Chimes)  . Back surgery    . Tonsillectomy      age 17  . Insert / replace / remove pacemaker    . Joint replacement Right   . Colonoscopy with esophagogastroduodenoscopy (egd) N/A 11/25/2012    SLF:Two SMALL AC polyps/Small internal hemorrhoids/The colon IS SLIGHTLY redundant-EGD:Schatzki ring at the gastroesophageal junction/Small hiatal hernia/MILD Non-erosive gastritis  . Transthoracic echocardiogram  08/2012    EF 55-65%, mod conc hypertrophy; mild AV regurg; calcified MV annulus with mod MR; LA mod diated; RV systolic pressure increased; RA mildly dilated; PA peak pressure 70mmHg  . Nm myocar perf wall motion  2011    dipyridamole myoview - moderate ischemai in mid anteroseptal basal inferior inferioer, mid lateral and basal inferolateral region; abnormal study, EF 41%; high risk scan  . Cardiac catheterization  08/08/2007    3 vessel CAD (LAD, mid Cfx, mid RCA) (Dr. Marella Chimes)    History   Social History  . Marital Status: Widowed    Spouse Name: N/A    Number of Children: 4  . Years of Education: N/A   Occupational History  .     Social History Main Topics  . Smoking status: Former Smoker    Quit date:  10/20/1999  . Smokeless tobacco: Not on file  . Alcohol Use: No  . Drug Use: No  . Sexual Activity: No   Other Topics Concern  . Not on file   Social History Narrative  . No narrative on file   BP 143/85 Pulse 71    PHYSICAL EXAM General: NAD Neck: No JVD, no thyromegaly. Lungs: Dry crackles at bases bilaterally with normal  respiratory effort. CV: Nondisplaced PMI.  Irregular rhythm, normal rate, normal S1/S2, no S3, no murmur. Legs are bandaged and swollen.  No carotid bruit.   Abdomen: Soft, nontender, no hepatosplenomegaly, obese.  Neurologic: Alert and oriented x 3.  Psych: Normal affect.   ECG: reviewed and available in electronic records.      ASSESSMENT AND PLAN: 1. CAD/CABG: She appears to be symptomatically stable. Continue aspirin, beta blocker, and pravastatin. 2. Atrial fibrillation: She is on warfarin, and I will enroll in anticoagulation clinic. Her heart rate is controlled on Toprol-XL 25 mg daily. Continue amiodarone 200 mg daily. Check LFT's and TFT's. 3. Pacemaker for SSS: Will enroll in device clinic for periodic monitoring. 4. Chronic diastolic heart failure: She appears to be compensated. Continue Lasix 80 mg three times daily and check BMET. She is on KCl supplementation. Continue enalapril 10 mg daily. 5. HTN: this is reasonably controlled for age. 6. Peripheral vascular disease: follows with Dr. Bridgett Larsson.  Dispo: f/u 6 months.  Kate Sable, M.D., F.A.C.C.

## 2013-10-29 NOTE — Progress Notes (Signed)
Patient ID: [name], female   DOB: 1933/04/11, 80 y.o.   MRN: [id number]

## 2013-10-29 NOTE — Addendum Note (Signed)
Addended by: Laurine Blazer on: 10/29/2013 03:09 PM   Modules accepted: Orders

## 2013-11-06 ENCOUNTER — Encounter: Payer: Self-pay | Admitting: Internal Medicine

## 2013-11-06 ENCOUNTER — Ambulatory Visit (INDEPENDENT_AMBULATORY_CARE_PROVIDER_SITE_OTHER): Payer: Medicare HMO | Admitting: *Deleted

## 2013-11-06 DIAGNOSIS — I4891 Unspecified atrial fibrillation: Secondary | ICD-10-CM

## 2013-11-06 LAB — MDC_IDC_ENUM_SESS_TYPE_INCLINIC
Brady Statistic AP VS Percent: 0.04 %
Brady Statistic AS VS Percent: 79.59 %
Date Time Interrogation Session: 20150409093415
Lead Channel Impedance Value: 448 Ohm
Lead Channel Impedance Value: 600 Ohm
Lead Channel Pacing Threshold Amplitude: 1 V
Lead Channel Pacing Threshold Pulse Width: 0.4 ms
Lead Channel Setting Sensing Sensitivity: 0.9 mV
MDC IDC MSMT BATTERY VOLTAGE: 2.96 V
MDC IDC MSMT LEADCHNL RA SENSING INTR AMPL: 0.9388
MDC IDC MSMT LEADCHNL RV SENSING INTR AMPL: 13.2544
MDC IDC SET LEADCHNL RV PACING AMPLITUDE: 2.5 V
MDC IDC SET LEADCHNL RV PACING PULSEWIDTH: 0.4 ms
MDC IDC STAT BRADY AP VP PERCENT: 0.21 %
MDC IDC STAT BRADY AS VP PERCENT: 20.15 %
MDC IDC STAT BRADY RA PERCENT PACED: 0.26 %
MDC IDC STAT BRADY RV PERCENT PACED: 20.36 %
Zone Setting Detection Interval: 350 ms
Zone Setting Detection Interval: 400 ms

## 2013-11-06 NOTE — Progress Notes (Signed)
Pacemaker check in clinic. Battery voltage 2.96 V. Pt in AF 100% of time. + Warfarin/ASA. Normal device function. Changed mode to VVIR due chronic AF. ROV in 3 mths w/JA in [place] office.

## 2014-01-22 ENCOUNTER — Encounter: Payer: Self-pay | Admitting: Family

## 2014-01-23 ENCOUNTER — Encounter: Payer: Self-pay | Admitting: Family

## 2014-01-23 ENCOUNTER — Ambulatory Visit (INDEPENDENT_AMBULATORY_CARE_PROVIDER_SITE_OTHER): Payer: Medicare HMO | Admitting: Family

## 2014-01-23 ENCOUNTER — Ambulatory Visit (HOSPITAL_COMMUNITY)
Admission: RE | Admit: 2014-01-23 | Discharge: 2014-01-23 | Disposition: A | Discharge status: Home / Self Care | Payer: Medicare HMO | Source: Ambulatory Visit | Attending: Family | Admitting: Family

## 2014-01-23 VITALS — BP 158/82 | HR 63 | Resp 16 | Ht 64.0 in | Wt 232.0 lb

## 2014-01-23 DIAGNOSIS — L97909 Non-pressure chronic ulcer of unspecified part of unspecified lower leg with unspecified severity: Secondary | ICD-10-CM

## 2014-01-23 DIAGNOSIS — L98499 Non-pressure chronic ulcer of skin of other sites with unspecified severity: Secondary | ICD-10-CM

## 2014-01-23 DIAGNOSIS — I739 Peripheral vascular disease, unspecified: Secondary | ICD-10-CM | POA: Insufficient documentation

## 2014-01-23 DIAGNOSIS — Z48812 Encounter for surgical aftercare following surgery on the circulatory system: Secondary | ICD-10-CM

## 2014-01-23 DIAGNOSIS — I83009 Varicose veins of unspecified lower extremity with ulcer of unspecified site: Secondary | ICD-10-CM

## 2014-01-23 NOTE — Addendum Note (Signed)
Addended by: Mena Goes on: 01/23/2014 02:03 PM   Modules accepted: Orders

## 2014-01-23 NOTE — Progress Notes (Signed)
[facility] [place] HISTORY AND PHYSICAL -PAD  History of Present Illness [name] is a 80 y.o. female patient of Dr. Bridgett Larsson who who presents with chief complaint: healing uclers. The patient has no rest pain and wounds include: bilateral achilles ulcers and new left shin ulcers. The patient notes symptoms have improved. The patient's treatment regimen currently included: maximal medical management, wound care dressing changes once/week in the nursing home, [facility]. She reports that she walks a great deal with her walker and has no problems with falling.  Pt Diabetic: Yes, glucose results in the 200's in the last 2 weeks, improved from 300's -400's Pt smoker: former-smoker, quit in 2001  Pt meds include: Statin :Yes Betablocker: Yes ASA: Yes Other anticoagulants/antiplatelets: coumadin, history if atrial fib  Past Medical History  Diagnosis Date  . Anemia 2008  . DM (diabetes mellitus) 1999  . Cataract     bilaterla   . Atrial fibrillation   . CAD (coronary artery disease)   . CRI (chronic renal insufficiency) 2008  . Hypertension   . Hyperlipemia   . GERD (gastroesophageal reflux disease)   . Arthritis   . Acute respiratory failure with hypoxia 06/12/2013    secondary to acute on chronic diastolic failure.  . Wrist fracture, left 06/13/2013  . Acute on chronic diastolic heart failure 43/32/9518  . Hx of CABG 10/24/2007    Dr. Roxan Hockey  . S/P CABG x 4   . Venous insufficiency   . Pacemaker 10/24/2007    Medtronic EnRhythm; implanted for SSS, symptomatic bradycardia, PAF  . Fracture November 2014    Left wrist    Social History History  Substance Use Topics  . Smoking status: Former Smoker -- 1.00 packs/day for 4 years    Types: Cigarettes    Quit date: 10/20/1999  . Smokeless tobacco: Never Used  . Alcohol Use: No    Family History Family History  Problem Relation Age of Onset  . Colon cancer Neg Hx   . Colon polyps  Neg Hx   . Hodgkin's lymphoma Mother   . Diabetes Son   . Stroke Father     Past Surgical History  Procedure Laterality Date  . Esophagogastroduodenoscopy  11/07/2007    ACZ:YSAYTK Schatzki's ring.  Otherwise normal esophagus   . Appendectomy      APPENDICITIS  . Coronary artery bypass graft  10/24/2007    LIMA to LAD, SVG to DX1, SVG to OM, SVG to distal RCA (Dr. Roxan Hockey)  . Pacemaker insertion  09/2007  . Total knee arthroplasty Right     2012  . Eye surgery Left   . Coronary angioplasty  11/16/2003    PCI to prox mid RCA (2.75x84mm chromium cobalt vision Guidant stent) (Dr. Marella Chimes)  . Back surgery    . Tonsillectomy      age 9  . Insert / replace / remove pacemaker    . Joint replacement Right   . Colonoscopy with esophagogastroduodenoscopy (egd) N/A 11/25/2012    SLF:Two SMALL AC polyps/Small internal hemorrhoids/The colon IS SLIGHTLY redundant-EGD:Schatzki ring at the gastroesophageal junction/Small hiatal hernia/MILD Non-erosive gastritis  . Transthoracic echocardiogram  08/2012    EF 55-65%, mod conc hypertrophy; mild AV regurg; calcified MV annulus with mod MR; LA mod diated; RV systolic pressure increased; RA mildly dilated; PA peak pressure 12mmHg  . Nm myocar perf wall motion  2011    dipyridamole myoview - moderate ischemai in mid anteroseptal basal inferior inferioer, mid  lateral and basal inferolateral region; abnormal study, EF 41%; high risk scan  . Cardiac catheterization  08/08/2007    3 vessel CAD (LAD, mid Cfx, mid RCA) (Dr. Marella Chimes)    No Known Allergies  Current Outpatient Prescriptions  Medication Sig Dispense Refill  . acetaminophen (TYLENOL) 650 MG suppository Place 650 mg rectally every 6 (six) hours as needed.      Marland Kitchen albuterol (PROVENTIL) (2.5 MG/3ML) 0.083% nebulizer solution Take 2.5 mg by nebulization every 6 (six) hours as needed for wheezing or shortness of breath.      . allopurinol (ZYLOPRIM) 100 MG tablet Take 100 mg by mouth every  morning.      Marland Kitchen ALPRAZolam (XANAX) 0.25 MG tablet Take 0.25 mg by mouth 3 (three) times daily as needed for anxiety.      Marland Kitchen amiodarone (PACERONE) 200 MG tablet Take 200 mg by mouth every morning.      Marland Kitchen aspirin 81 MG tablet Take 81 mg by mouth daily.       . budesonide (PULMICORT) 0.5 MG/2ML nebulizer solution Take 0.5 mg by nebulization 2 (two) times daily.      . calcium-vitamin D (OSCAL WITH D) 500-200 MG-UNIT per tablet Take 1 tablet by mouth 2 (two) times daily.      . cholecalciferol (VITAMIN D) 1000 UNITS tablet Take 1,000 Units by mouth daily.      . citalopram (CELEXA) 20 MG tablet Take 20 mg by mouth every morning.      . docusate sodium (COLACE) 100 MG capsule Take 100 mg by mouth 2 (two) times daily.      . enalapril (VASOTEC) 10 MG tablet Take 10 mg by mouth every morning.      . fentaNYL (DURAGESIC - DOSED MCG/HR) 25 MCG/HR patch Place 25 mcg onto the skin every 3 (three) days.      . folic acid (FOLVITE) 1 MG tablet Take 1 mg by mouth 2 (two) times daily.      Marland Kitchen FORA V12 BLOOD GLUCOSE TEST test strip       . furosemide (LASIX) 80 MG tablet Take 80 mg by mouth 3 (three) times daily.      . insulin aspart (NOVOLOG) 100 UNIT/ML FlexPen Inject into the skin 3 (three) times daily with meals. Sliding scale      . iron polysaccharides (NIFEREX) 150 MG capsule Take 150 mg by mouth 2 (two) times daily.      Marland Kitchen levothyroxine (SYNTHROID, LEVOTHROID) 125 MCG tablet Take 125 mcg by mouth daily before breakfast.      . magnesium oxide (MAG-OX) 400 MG tablet Take 400 mg by mouth every morning.      . metoprolol succinate (TOPROL-XL) 25 MG 24 hr tablet Take 25 mg by mouth daily.      Marland Kitchen NITROSTAT 0.4 MG SL tablet       . NOVOFINE AUTOCOVER 30G X 8 MM MISC       . oxybutynin (DITROPAN) 5 MG tablet Take 5 mg by mouth 2 (two) times daily.      . pantoprazole (PROTONIX) 40 MG tablet Take 40 mg by mouth every morning.      . polyethylene glycol (MIRALAX / GLYCOLAX) packet Take 17 g by mouth daily.       . potassium chloride SA (K-DUR,KLOR-CON) 20 MEQ tablet Take 20 mEq by mouth 2 (two) times daily.       . pravastatin (PRAVACHOL) 40 MG tablet Take 40 mg by mouth every evening.       Marland Kitchen  Probiotic Product (PROBIOTIC DAILY PO) Take 1 tablet by mouth every evening.      . traMADol (ULTRAM) 50 MG tablet Take 50 mg by mouth every 4 (four) hours as needed for moderate pain.       Marland Kitchen warfarin (COUMADIN) 3 MG tablet 3 mg on Tuesday, Thursday, Saturday, Sunday 4.5mg  on Monday, Wednesday, Friday       No current facility-administered medications for this visit.    ROS: See HPI for pertinent positives and negatives.   Physical Examination   Filed Vitals:   01/23/14 1222  BP: 158/82  Pulse: 63  Resp: 16  Height: 5\' 4"  (1.626 m)  Weight: 232 lb (105.235 kg)  SpO2: 98%   Body mass index is 39.8 kg/(m^2).  General: A&O x 3, WD, morbidly obese  Pulmonary: Sym exp, good air movt, CTAB, no rales, rhonchi, & wheezing  Cardiac: RRR, Nl S1, S2, no Murmur detected Vascular:  Vessel  Right  Left   Radial  2+Palpable  2+Palpable   Carotid   without bruit   without bruit   Aorta  Not palpable  N/A   Femoral  Not Palpable due to pannus  Not Palpable due to pannus   Popliteal  Not palpable  Not palpable   PT  Not Palpable  Not Palpable   DP  2+Palpable  2+Palpable    Gastrointestinal: soft, NTND, -G/R, - HSM, - masses, - CVAT B  Musculoskeletal: M/S 5/5 throughout , B edema 2+, R achilles ulcer: contracted and nearly healed, good granulation, L achilles ulcer: clean, contracted, good granulation, new small VSU L shin  Neurologic: Pain and light touch intact in extremities except decreased in feet, Motor exam as listed above   Non-Invasive Vascular Imaging: DATE: 01/23/2014 ABI's cannot be obtained due to non-compressible vessels: RIGHT  TBI: 0.59 Waveforms: triphasic;  LEFT TBI: 0.53, Waveforms: mono and biphasic Previous (10/17/2013) TBI's: Right: 0.61 with triphasic DP and monophasic PT  waveforms, Left: 0.31 with monophasic PT and DP waveforms  ASSESSMENT: [name] is a 80 y.o. female who presents with slowly healing ulcers and improved arterial occlusive disease in both lower legs. Three months ago DP pulses were not palpable, today both DP pulse are 2+ palpable.  Bilateral ABI waveforms have improved from mono to bi and triphasic. Current wound care regimen and oral supplements are improving her arterial perfusion and wound healing.  PLAN:  B achilles ulcers, LLE VSU, LLE critical limb ischemia , BLE chronic venous insufficiency (c6), CKD IV  Pt should be able to heal R leg with some compression to treat the RLE CVI.  The left leg will need compression once she heals her ulcers. I would continue with wound care. I continue to think she would be a poor surgical candidate. With CKD Stage IV, she is a poor endovascular candidate also.  I think her wound healing is adequate to continue with the current regimen and avoid any intervention for now.  I discussed in depth with the patient the nature of atherosclerosis, and emphasized the importance of maximal medical management including strict control of blood pressure, blood glucose, and lipid levels, antiplatelet agents, obtaining regular exercise, and cessation of smoking.  The patient is aware that without maximal medical management the underlying atherosclerotic disease process will progress, limiting the benefit of any interventions. The patient is currently on a statin: Pravachol.  The patient is currently on an anti-platelet: ASA. The steroids will interfere with her wound healing also. Consider vitamin supplementation  including Vitamin A to help with wound healing.  I will recheck her progress in 3 months with ABI.   The patient was given information about PAD including signs, symptoms, treatment, what symptoms should prompt the patient to seek immediate medical care, and risk reduction measures to take.  Clemon Chambers, RN, MSN, FNP-C [facility] Phone: [phone]  Clinic MD: Bridgett Larsson  01/23/2014 12:30 PM

## 2014-02-28 HISTORY — PX: EYE SURGERY: SHX253

## 2014-03-04 ENCOUNTER — Encounter: Payer: Self-pay | Admitting: *Deleted

## 2014-03-10 IMAGING — CR DG WRIST 2V*L*
2 series · 5 of 5 positions shown · non-contrast
Comparison: None.

CLINICAL DATA: Fall, pain.

EXAM:
LEFT WRIST - 2 VIEW

[Series 2: pa · 0.17mm/px · 3 of 3 slices shown]
[im 1/3]
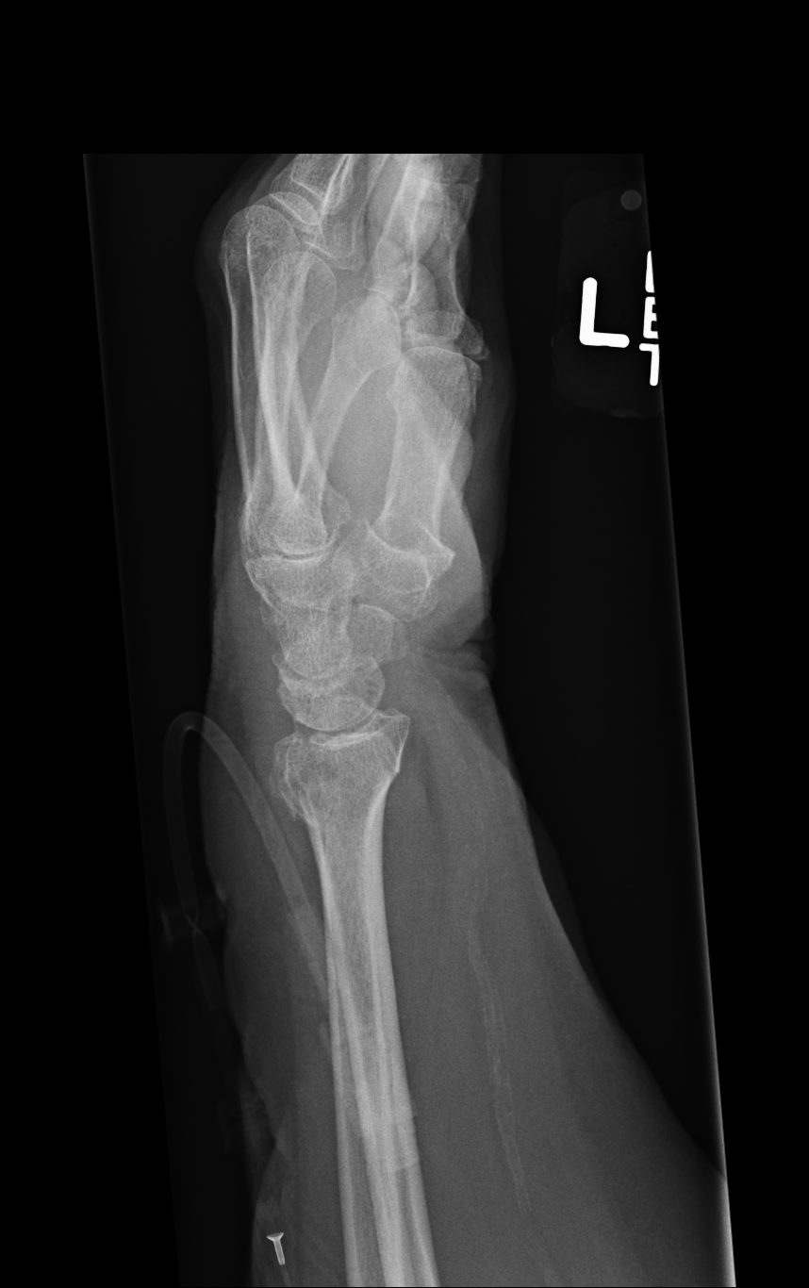
[im 2/3]
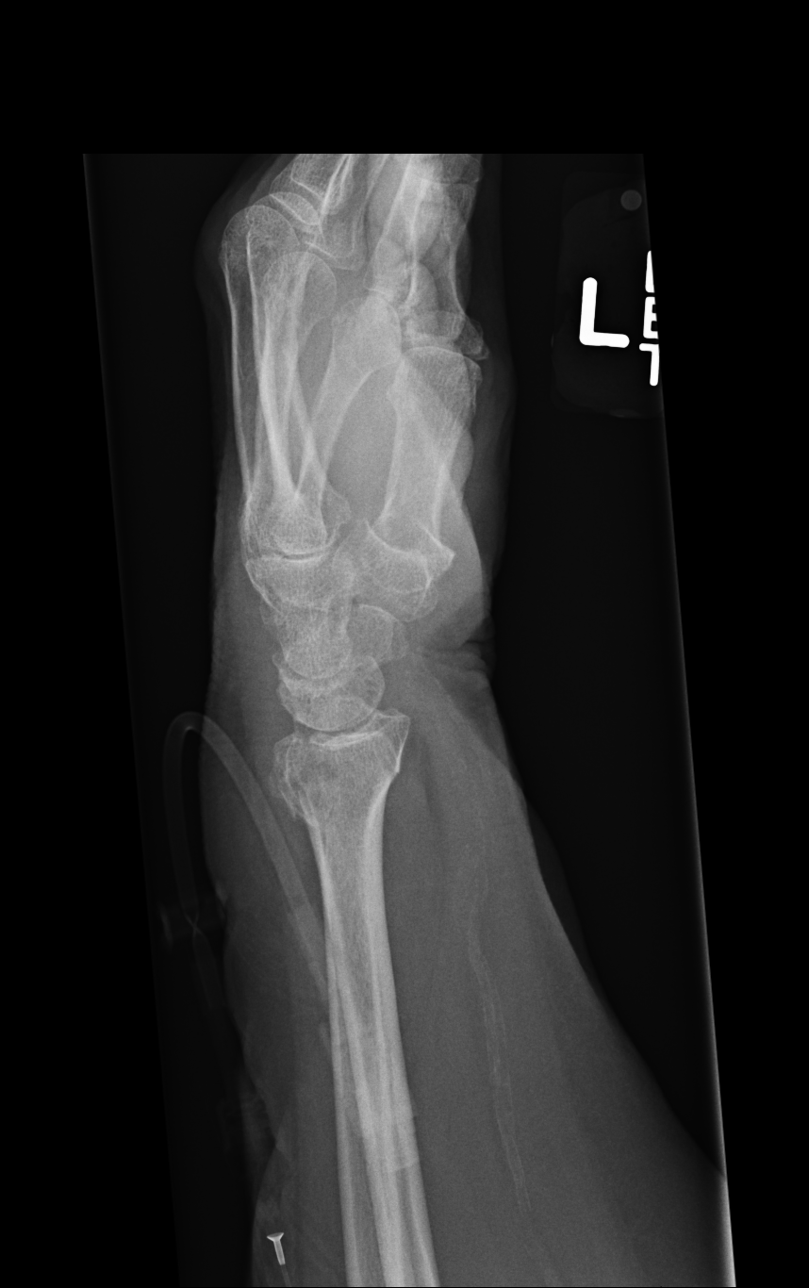
[im 3/3]
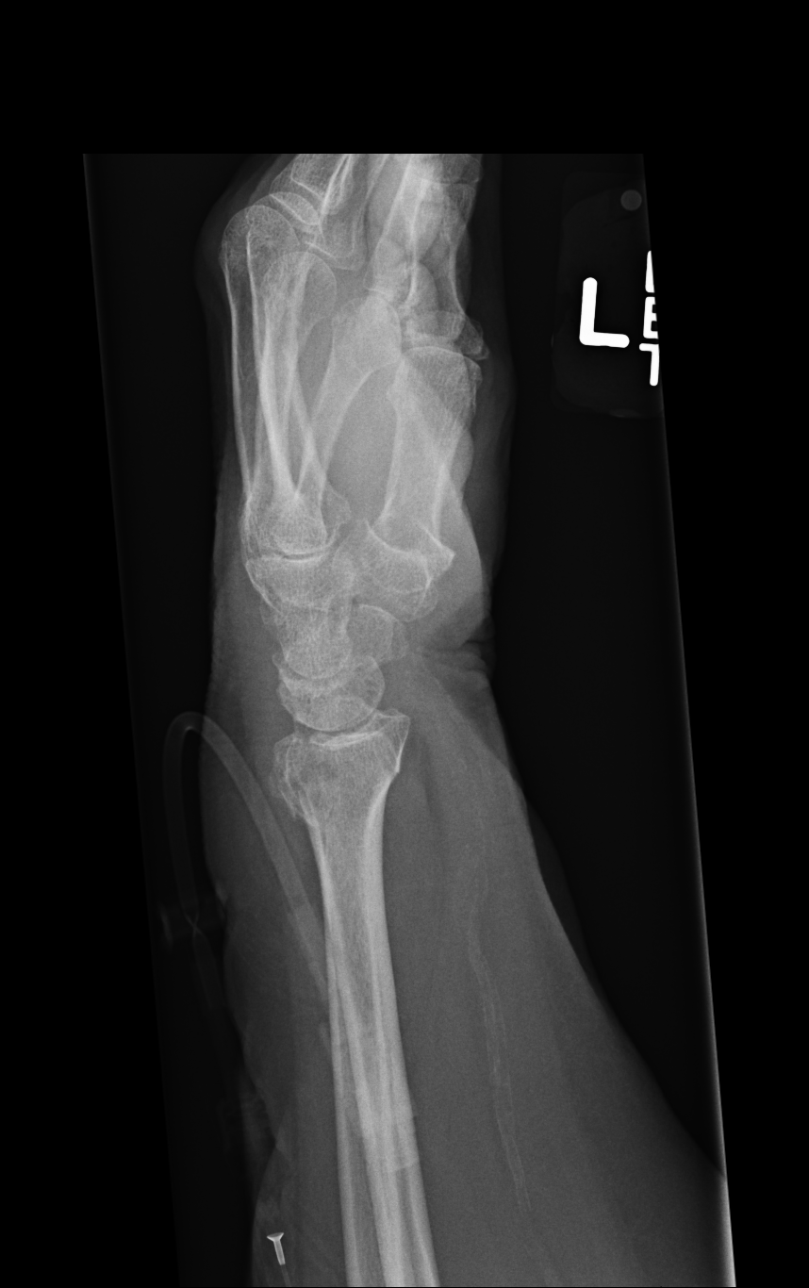

[Series 3: lat · 0.17mm/px · 2 of 2 slices shown]
[im 1/2]
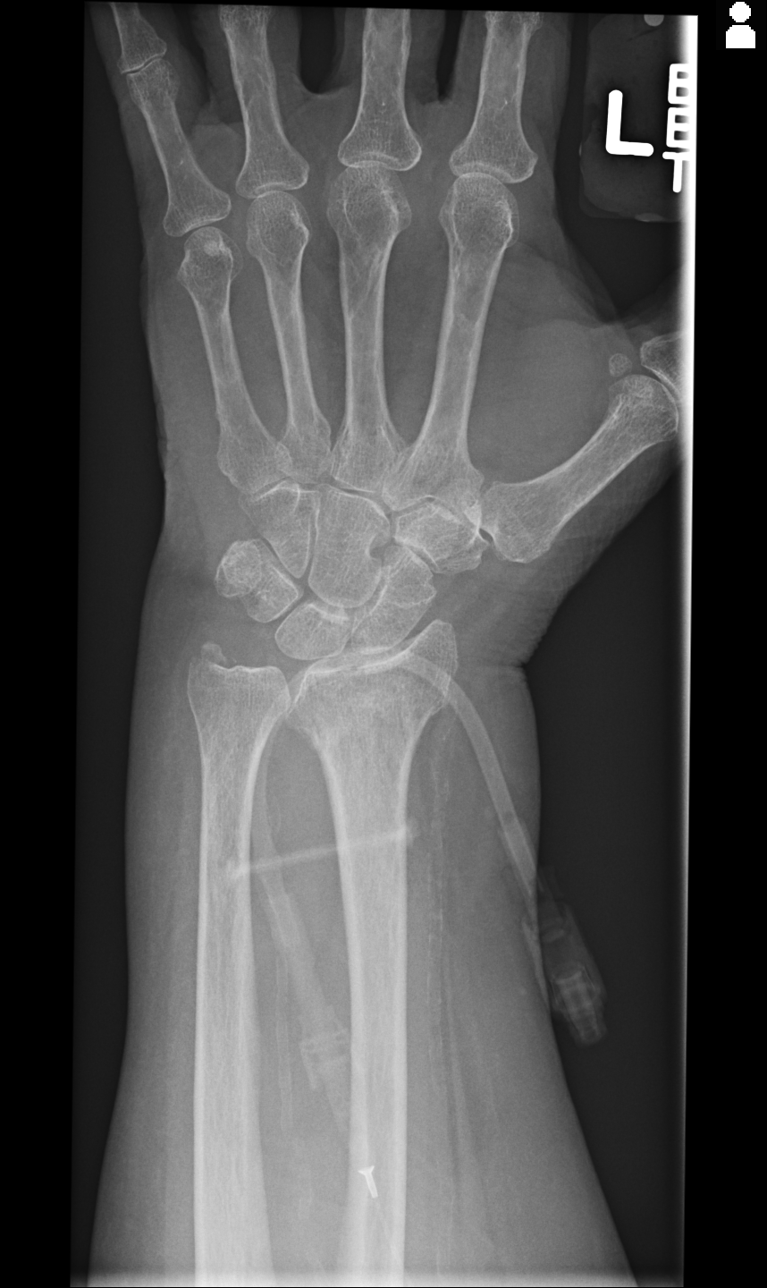
[im 2/2]
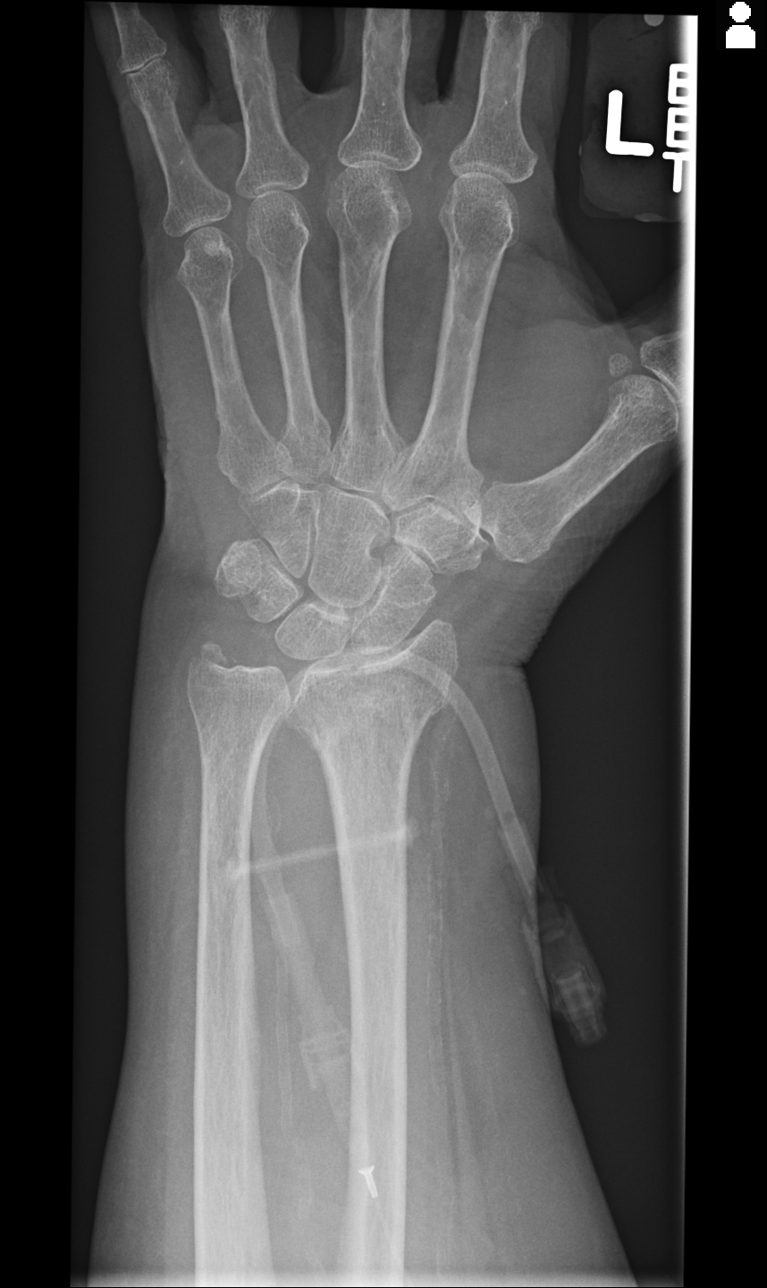

[5 of 5 positions shown; findings below may reference images not displayed]

FINDINGS: There is an intra-articular fracture through the distal left radius.
Minimally displaced fracture fragments. Ulnar styloid fracture
noted. No significant angulation.
IMPRESSION: Minimally displaced intra-articular fracture of the distal left
radius. Ulnar styloid fracture.

## 2014-03-13 IMAGING — CR DG CHEST 1V PORT
1 series · 1 of 1 positions shown · non-contrast
Comparison: 06/11/2013

CLINICAL DATA: Short of breath.  FOLLOW UP edema.

EXAM:
PORTABLE CHEST - 1 VIEW

[portable]
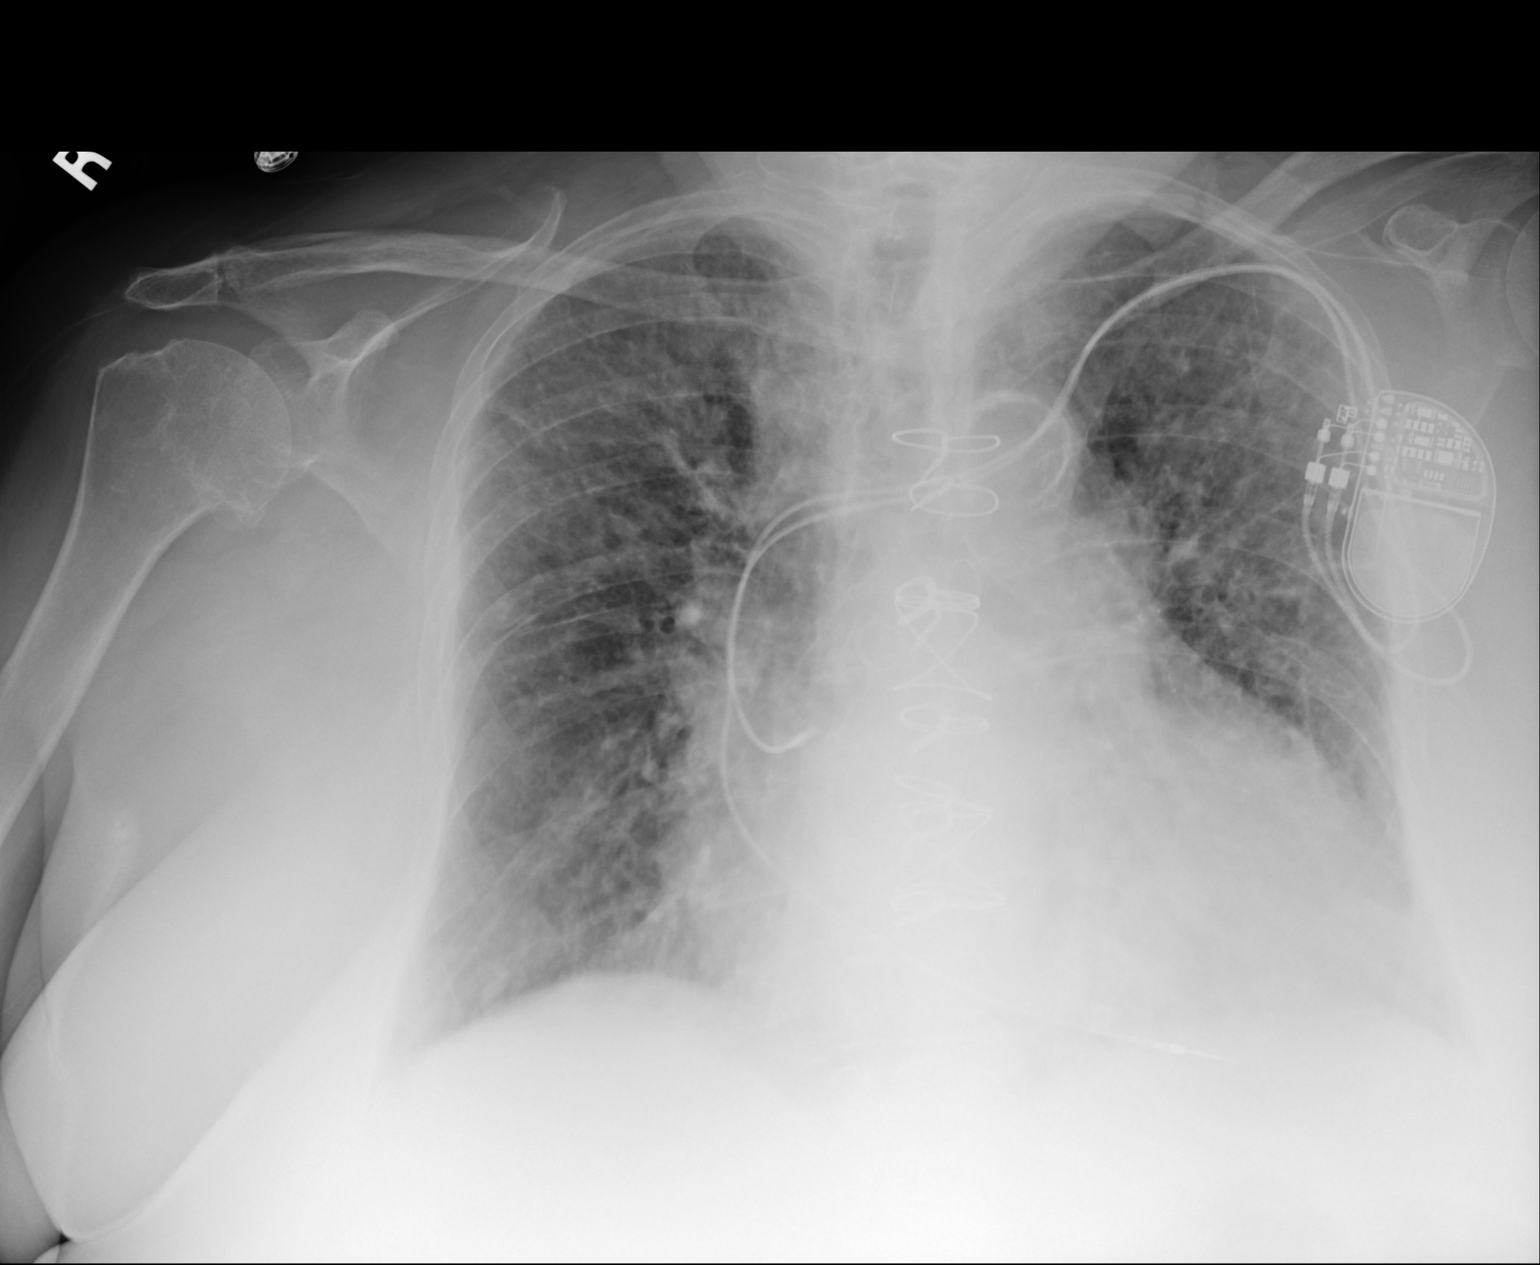

[1 of 1 positions shown; findings below may reference images not displayed]

FINDINGS: Irregular interstitial thickening with intervening areas of hazy
opacity are noted in the lungs. The hazy intervening opacity appears
increased from the prior study. This is most notable in the upper
lobes.

Change cardiac surgery are stable. The cardiac silhouette is mildly
enlarged.
IMPRESSION: 1. Mild increased hazy opacity mostly in the upper lobes with
relatively unchanged. The combination of findings suggest worsened
congestive heart failure.

## 2014-04-17 ENCOUNTER — Encounter: Payer: Self-pay | Admitting: Internal Medicine

## 2014-04-17 ENCOUNTER — Ambulatory Visit (INDEPENDENT_AMBULATORY_CARE_PROVIDER_SITE_OTHER): Payer: Medicare HMO | Admitting: Internal Medicine

## 2014-04-17 VITALS — BP 137/82 | HR 65 | Ht 64.0 in | Wt 231.0 lb

## 2014-04-17 DIAGNOSIS — I4891 Unspecified atrial fibrillation: Secondary | ICD-10-CM

## 2014-04-17 DIAGNOSIS — I498 Other specified cardiac arrhythmias: Secondary | ICD-10-CM

## 2014-04-17 DIAGNOSIS — R001 Bradycardia, unspecified: Secondary | ICD-10-CM | POA: Insufficient documentation

## 2014-04-17 DIAGNOSIS — I482 Chronic atrial fibrillation, unspecified: Secondary | ICD-10-CM

## 2014-04-17 LAB — MDC_IDC_ENUM_SESS_TYPE_INCLINIC
Date Time Interrogation Session: 20150918124030
Lead Channel Impedance Value: 440 Ohm
Lead Channel Impedance Value: 672 Ohm
Lead Channel Sensing Intrinsic Amplitude: 17.44 mV
Lead Channel Sensing Intrinsic Amplitude: 2.0564
Lead Channel Setting Pacing Amplitude: 2.5 V
Lead Channel Setting Pacing Pulse Width: 0.4 ms
Lead Channel Setting Sensing Sensitivity: 0.9 mV
MDC IDC MSMT BATTERY VOLTAGE: 2.93 V
MDC IDC MSMT LEADCHNL RV PACING THRESHOLD AMPLITUDE: 1 V
MDC IDC MSMT LEADCHNL RV PACING THRESHOLD PULSEWIDTH: 0.4 ms
MDC IDC SET ZONE DETECTION INTERVAL: 400 ms
MDC IDC STAT BRADY RV PERCENT PACED: 47.91 %
Zone Setting Detection Interval: 350 ms

## 2014-04-17 NOTE — Addendum Note (Signed)
Addended by: Merlene Laughter on: 04/17/2014 01:50 PM   Modules accepted: Orders, Medications

## 2014-04-17 NOTE — Progress Notes (Signed)
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 female with a h/o bradycardia sp PPM (MDT) by Dr Rollene Fare who presents today to establish care in the Electrophysiology device clinic.  She has multiple chronic health concerns and is a nursing home resident.  She has had progression to permanent atrial fibrillation.  She is not very active and has difficulty with chronic edema/ venous insufficiency. Today, she  denies symptoms of palpitations, chest pain, shortness of breath,   dizziness, presyncope, syncope, or neurologic sequela.  The patientis tolerating medications without difficulties and is otherwise without complaint today.   Past Medical History  Diagnosis Date  . Anemia 2008  . DM (diabetes mellitus) 1999  . Cataract     bilaterla   . Permanent atrial fibrillation   . CAD (coronary artery disease)   . CRI (chronic renal insufficiency) 2008  . Hypertension   . Hyperlipemia   . GERD (gastroesophageal reflux disease)   . Arthritis   . Acute respiratory failure with hypoxia 06/12/2013    secondary to acute on chronic diastolic failure.  . Wrist fracture, left 06/13/2013  . Acute on chronic congestive heart failure with left ventricular diastolic dysfunction 58/85/0277  . Hx of CABG 10/24/2007    Dr. Roxan Hockey  . S/P CABG x 4   . Venous insufficiency   . Pacemaker 10/24/2007    Medtronic EnRhythm; implanted for SSS, symptomatic bradycardia, PAF  . Fracture November 2014    Left wrist  . Obesity    Past Surgical History  Procedure Laterality Date  . Esophagogastroduodenoscopy  11/07/2007    AJO:INOMVE Schatzki's ring.  Otherwise normal esophagus   . Appendectomy      APPENDICITIS  . Coronary artery bypass graft  10/24/2007    LIMA to LAD, SVG to DX1, SVG to OM, SVG to distal RCA (Dr. Roxan Hockey)  . Pacemaker insertion  10/24/2007    MDT EnRhythm implanted by Dr Rollene Fare  . Total knee arthroplasty Right     2012  . Eye  surgery Left   . Coronary angioplasty  11/16/2003    PCI to prox mid RCA (2.75x91mm chromium cobalt vision Guidant stent) (Dr. Marella Chimes)  . Back surgery    . Tonsillectomy      age 72  . Insert / replace / remove pacemaker    . Joint replacement Right   . Colonoscopy with esophagogastroduodenoscopy (egd) N/A 11/25/2012    SLF:Two SMALL AC polyps/Small internal hemorrhoids/The colon IS SLIGHTLY redundant-EGD:Schatzki ring at the gastroesophageal junction/Small hiatal hernia/MILD Non-erosive gastritis  . Transthoracic echocardiogram  08/2012    EF 55-65%, mod conc hypertrophy; mild AV regurg; calcified MV annulus with mod MR; LA mod diated; RV systolic pressure increased; RA mildly dilated; PA peak pressure 90mmHg  . Nm myocar perf wall motion  2011    dipyridamole myoview - moderate ischemai in mid anteroseptal basal inferior inferioer, mid lateral and basal inferolateral region; abnormal study, EF 41%; high risk scan  . Cardiac catheterization  08/08/2007    3 vessel CAD (LAD, mid Cfx, mid RCA) (Dr. Marella Chimes)    History   Social History  . Marital Status: Widowed    Spouse Name: N/A    Number of Children: 4  . Years of Education: N/A   Occupational History  .     Social History Main Topics  . Smoking status: Former Smoker -- 1.00 packs/day for 4 years    Types: Cigarettes  Quit date: 10/20/1999  . Smokeless tobacco: Never Used  . Alcohol Use: No  . Drug Use: No  . Sexual Activity: No   Other Topics Concern  . Not on file   Social History Narrative   Nursing home resident    Family History  Problem Relation Age of Onset  . Colon cancer Neg Hx   . Colon polyps Neg Hx   . Hodgkin's lymphoma Mother   . Diabetes Son   . Stroke Father     No Known Allergies  Current Outpatient Prescriptions  Medication Sig Dispense Refill  . albuterol (PROVENTIL) (2.5 MG/3ML) 0.083% nebulizer solution Take 2.5 mg by nebulization every 6 (six) hours as needed for wheezing or  shortness of breath.      . ALPRAZolam (XANAX) 0.25 MG tablet Take 0.25 mg by mouth 3 (three) times daily as needed for anxiety.      Marland Kitchen amiodarone (PACERONE) 200 MG tablet Take 200 mg by mouth every morning.      Marland Kitchen aspirin 81 MG tablet Take 81 mg by mouth daily.       . calcium-vitamin D (OSCAL WITH D) 500-200 MG-UNIT per tablet Take 1 tablet by mouth 2 (two) times daily.      . cholecalciferol (VITAMIN D) 1000 UNITS tablet Take 1,000 Units by mouth daily.      . citalopram (CELEXA) 20 MG tablet Take 20 mg by mouth every morning.      . docusate sodium (COLACE) 100 MG capsule Take 100 mg by mouth 2 (two) times daily.      . fentaNYL (DURAGESIC - DOSED MCG/HR) 25 MCG/HR patch Place 25 mcg onto the skin every 3 (three) days.      . folic acid (FOLVITE) 1 MG tablet Take 1 mg by mouth 2 (two) times daily.      Marland Kitchen FORA V12 BLOOD GLUCOSE TEST test strip       . furosemide (LASIX) 80 MG tablet Take 80 mg by mouth 3 (three) times daily.      . insulin aspart (NOVOLOG) 100 UNIT/ML FlexPen Inject into the skin 3 (three) times daily with meals. Sliding scale      . iron polysaccharides (NIFEREX) 150 MG capsule Take 150 mg by mouth 2 (two) times daily.      Marland Kitchen levothyroxine (SYNTHROID, LEVOTHROID) 125 MCG tablet Take 125 mcg by mouth daily before breakfast.      . magnesium oxide (MAG-OX) 400 MG tablet Take 400 mg by mouth every morning.      . metoprolol succinate (TOPROL-XL) 25 MG 24 hr tablet Take 25 mg by mouth 2 (two) times daily.       Marland Kitchen NITROSTAT 0.4 MG SL tablet       . NOVOFINE AUTOCOVER 30G X 8 MM MISC       . pantoprazole (PROTONIX) 40 MG tablet Take 40 mg by mouth every morning.      . polyethylene glycol (MIRALAX / GLYCOLAX) packet Take 17 g by mouth daily.      . potassium chloride SA (K-DUR,KLOR-CON) 20 MEQ tablet Take 20 mEq by mouth 2 (two) times daily.       . pravastatin (PRAVACHOL) 40 MG tablet Take 40 mg by mouth every evening.       . Probiotic Product (PROBIOTIC DAILY PO) Take 1  tablet by mouth every evening.      . traMADol (ULTRAM) 50 MG tablet Take 50 mg by mouth every 4 (four) hours as needed for  moderate pain.       Marland Kitchen warfarin (COUMADIN) 4 MG tablet Take 4 mg by mouth as directed.      . warfarin (COUMADIN) 5 MG tablet Take 5 mg by mouth as directed. TUESDAY, Thursday, SAT, SUN      . acetaminophen (TYLENOL) 650 MG suppository Place 650 mg rectally every 6 (six) hours as needed.      . budesonide (PULMICORT) 0.5 MG/2ML nebulizer solution Take 0.5 mg by nebulization 2 (two) times daily.       No current facility-administered medications for this visit.    ROS- all systems are reviewed and negative except as per HPI  Physical Exam: Filed Vitals:   04/17/14 1217  BP: 137/82  Pulse: 65  Height: 5\' 4"  (1.626 m)  Weight: 231 lb (104.781 kg)  SpO2: 94%    GEN- The patient is elderly appearing, alert and oriented x 3 today.   Head- normocephalic, atraumatic Eyes-  Sclera clear, conjunctiva pink Ears- hearing intact Oropharynx- clear Neck- supple  Lungs- Clear to ausculation bilaterally, normal work of breathing Chest- pacemaker pocket is well healed Heart- irregular rate and rhythm  GI- soft, NT, ND, + BS Extremities- no clubbing, cyanosis, + dependant edema MS- diffuse muscle atrophy Skin- no rash or lesion Psych- euthymic mood, full affect Neuro- strength and sensation are intact  Pacemaker interrogation- reviewed in detail today,  See PACEART report  Assessment and Plan:  1. Symptomatic bradycardia Normal pacemaker function See Pace Art report No changes today  2. Permanent atrial fibrillation chad2vasc score is at least 6.  Continue long term anticoagulation As she is in permanent afib, I will stop amiodarone today Will need to follow heart rates off of this medicine  3. CAD Stable No change required today  4. Obesity Body mass index is 39.63 kg/(m^2). Weight loss is advised  5. Chronic venous insufficiency Stable No change  required today  Return to the device clinic in 6 months I will see in a year

## 2014-04-17 NOTE — Patient Instructions (Addendum)
Your physician recommends that you schedule a follow-up appointment in: 1 year with Dr. Rayann Heman. You will receive a reminder letter in the mail in about 10 months reminding you to call and schedule your appointment. If you don't receive this letter, please contact our office. Your physician recommends that you schedule a follow-up appointment in: 6 months in the device clinic with [facility]. You will receive a reminder letter in the mail in about 4 months reminding you to call and schedule your appointment. If you don't receive this letter, please contact our office. Your physician has recommended you make the following change in your medication:  Stop amiodarone. Alvin Critchley, nurse at [facility] informed via phone. Continue all other medications the same.

## 2014-04-23 ENCOUNTER — Encounter: Payer: Self-pay | Admitting: Family

## 2014-04-24 ENCOUNTER — Ambulatory Visit (HOSPITAL_COMMUNITY)
Admission: RE | Admit: 2014-04-24 | Discharge: 2014-04-24 | Disposition: A | Discharge status: Home / Self Care | Payer: Medicare HMO | Source: Ambulatory Visit | Attending: Family | Admitting: Family

## 2014-04-24 ENCOUNTER — Ambulatory Visit (INDEPENDENT_AMBULATORY_CARE_PROVIDER_SITE_OTHER): Payer: Medicare HMO | Admitting: Family

## 2014-04-24 ENCOUNTER — Encounter: Payer: Self-pay | Admitting: Family

## 2014-04-24 VITALS — BP 142/80 | HR 71 | Temp 97.2°F | Resp 16 | Ht 64.0 in | Wt 240.0 lb

## 2014-04-24 DIAGNOSIS — I739 Peripheral vascular disease, unspecified: Secondary | ICD-10-CM | POA: Insufficient documentation

## 2014-04-24 DIAGNOSIS — I10 Essential (primary) hypertension: Secondary | ICD-10-CM | POA: Insufficient documentation

## 2014-04-24 DIAGNOSIS — L98499 Non-pressure chronic ulcer of skin of other sites with unspecified severity: Secondary | ICD-10-CM | POA: Insufficient documentation

## 2014-04-24 DIAGNOSIS — Z48812 Encounter for surgical aftercare following surgery on the circulatory system: Secondary | ICD-10-CM

## 2014-04-24 DIAGNOSIS — E785 Hyperlipidemia, unspecified: Secondary | ICD-10-CM | POA: Insufficient documentation

## 2014-04-24 DIAGNOSIS — E119 Type 2 diabetes mellitus without complications: Secondary | ICD-10-CM | POA: Diagnosis not present

## 2014-04-24 NOTE — Addendum Note (Signed)
Addended by: Mena Goes on: 04/24/2014 05:11 PM   Modules accepted: Orders

## 2014-04-24 NOTE — Progress Notes (Signed)
[facility] [place] HISTORY AND PHYSICAL -PAD  History of Present Illness [name] is a 80 y.o. female patient of Dr. Bridgett Larsson who who presents with chief complaint: healing uclers, PAD, and chronic venous insufficiency. The patient has no rest pain and wounds include: bilateral achilles ulcers and new left shin ulcers. The patient notes symptoms have improved. The patient's treatment regimen currently included: maximal medical management, wound care dressing changes once/week in the nursing home, [facility].   She reports that she walks a great deal with her walker and has no problems with falling.   Pt Diabetic: Yes, glucose results in the 200's in the last 2 weeks, improved from 300's -400's  Pt smoker: former-smoker, quit in 2001  Pt meds include:  Statin :Yes  Betablocker: Yes  ASA: Yes  Other anticoagulants/antiplatelets: coumadin, history if atrial fib   Past Medical History  Diagnosis Date  . Anemia 2008  . DM (diabetes mellitus) 1999  . Cataract     bilaterla   . Permanent atrial fibrillation   . CAD (coronary artery disease)   . CRI (chronic renal insufficiency) 2008  . Hypertension   . Hyperlipemia   . GERD (gastroesophageal reflux disease)   . Arthritis   . Acute respiratory failure with hypoxia 06/12/2013    secondary to acute on chronic diastolic failure.  . Wrist fracture, left 06/13/2013  . Acute on chronic congestive heart failure with left ventricular diastolic dysfunction 03/50/0938  . Hx of CABG 10/24/2007    Dr. Roxan Hockey  . S/P CABG x 4   . Venous insufficiency   . Pacemaker 10/24/2007    Medtronic EnRhythm; implanted for SSS, symptomatic bradycardia, PAF  . Fracture November 2014    Left wrist  . Obesity     Social History History  Substance Use Topics  . Smoking status: Former Smoker -- 1.00 packs/day for 4 years    Types: Cigarettes    Quit date: 10/20/1999  . Smokeless tobacco: Never Used  . Alcohol  Use: No    Family History Family History  Problem Relation Age of Onset  . Colon cancer Neg Hx   . Colon polyps Neg Hx   . Hodgkin's lymphoma Mother   . Diabetes Son   . Stroke Father     Past Surgical History  Procedure Laterality Date  . Esophagogastroduodenoscopy  11/07/2007    HWE:[id number] Schatzki's ring.  Otherwise normal esophagus   . Appendectomy      APPENDICITIS  . Coronary artery bypass graft  10/24/2007    LIMA to LAD, SVG to DX1, SVG to OM, SVG to distal RCA (Dr. Roxan Hockey)  . Pacemaker insertion  10/24/2007    MDT EnRhythm implanted by Dr Rollene Fare  . Total knee arthroplasty Right     2012  . Coronary angioplasty  11/16/2003    PCI to prox mid RCA (2.75x86mm chromium cobalt vision Guidant stent) (Dr. Marella Chimes)  . Back surgery    . Tonsillectomy      age 78  . Insert / replace / remove pacemaker    . Joint replacement Right   . Colonoscopy with esophagogastroduodenoscopy (egd) N/A 11/25/2012    SLF:Two SMALL AC polyps/Small internal hemorrhoids/The colon IS SLIGHTLY redundant-EGD:Schatzki ring at the gastroesophageal junction/Small hiatal hernia/MILD Non-erosive gastritis  . Transthoracic echocardiogram  08/2012    EF 55-65%, mod conc hypertrophy; mild AV regurg; calcified MV annulus with mod MR; LA mod diated; RV systolic pressure increased; RA mildly dilated; PA  peak pressure 98mmHg  . Nm myocar perf wall motion  2011    dipyridamole myoview - moderate ischemai in mid anteroseptal basal inferior inferioer, mid lateral and basal inferolateral region; abnormal study, EF 41%; high risk scan  . Cardiac catheterization  08/08/2007    3 vessel CAD (LAD, mid Cfx, mid RCA) (Dr. Marella Chimes)  . Eye surgery Left Aug. 2015    Allergies  Allergen Reactions  . Penicillins Other (See Comments)    Pt. Not sure of reaction.    Current Outpatient Prescriptions  Medication Sig Dispense Refill  . acetaminophen (TYLENOL) 650 MG suppository Place 650 mg rectally every 6  (six) hours as needed.      Marland Kitchen albuterol (PROVENTIL) (2.5 MG/3ML) 0.083% nebulizer solution Take 2.5 mg by nebulization every 6 (six) hours as needed for wheezing or shortness of breath.      . ALPRAZolam (XANAX) 0.25 MG tablet Take 0.25 mg by mouth 3 (three) times daily as needed for anxiety.      Marland Kitchen aspirin 81 MG tablet Take 81 mg by mouth daily.       . budesonide (PULMICORT) 0.5 MG/2ML nebulizer solution Take 0.5 mg by nebulization 2 (two) times daily.      . calcium-vitamin D (OSCAL WITH D) 500-200 MG-UNIT per tablet Take 1 tablet by mouth 2 (two) times daily.      . cholecalciferol (VITAMIN D) 1000 UNITS tablet Take 1,000 Units by mouth daily.      . citalopram (CELEXA) 20 MG tablet Take 20 mg by mouth every morning.      . docusate sodium (COLACE) 100 MG capsule Take 100 mg by mouth 2 (two) times daily.      . fentaNYL (DURAGESIC - DOSED MCG/HR) 25 MCG/HR patch Place 25 mcg onto the skin every 3 (three) days.      . folic acid (FOLVITE) 1 MG tablet Take 1 mg by mouth 2 (two) times daily.      Marland Kitchen FORA V12 BLOOD GLUCOSE TEST test strip       . furosemide (LASIX) 80 MG tablet Take 80 mg by mouth 3 (three) times daily.      . insulin aspart (NOVOLOG) 100 UNIT/ML FlexPen Inject into the skin 3 (three) times daily with meals. Sliding scale      . iron polysaccharides (NIFEREX) 150 MG capsule Take 150 mg by mouth 2 (two) times daily.      Marland Kitchen levothyroxine (SYNTHROID, LEVOTHROID) 125 MCG tablet Take 125 mcg by mouth daily before breakfast.      . magnesium oxide (MAG-OX) 400 MG tablet Take 400 mg by mouth every morning.      . metoprolol succinate (TOPROL-XL) 25 MG 24 hr tablet Take 25 mg by mouth 2 (two) times daily.       Marland Kitchen NITROSTAT 0.4 MG SL tablet       . NOVOFINE AUTOCOVER 30G X 8 MM MISC       . pantoprazole (PROTONIX) 40 MG tablet Take 40 mg by mouth every morning.      . polyethylene glycol (MIRALAX / GLYCOLAX) packet Take 17 g by mouth daily.      . potassium chloride SA (K-DUR,KLOR-CON)  20 MEQ tablet Take 20 mEq by mouth 2 (two) times daily.       . pravastatin (PRAVACHOL) 40 MG tablet Take 40 mg by mouth every evening.       . Probiotic Product (PROBIOTIC DAILY PO) Take 1 tablet by mouth every evening.      Marland Kitchen  traMADol (ULTRAM) 50 MG tablet Take 50 mg by mouth every 4 (four) hours as needed for moderate pain.       Marland Kitchen warfarin (COUMADIN) 4 MG tablet Take 4 mg by mouth as directed.      . warfarin (COUMADIN) 5 MG tablet Take 5 mg by mouth as directed. TUESDAY, Thursday, SAT, SUN       No current facility-administered medications for this visit.    ROS: See HPI for pertinent positives and negatives.   Physical Examination  Filed Vitals:   04/24/14 1044  BP: 142/80  Pulse: 71  Temp: 97.2 F (36.2 C)  TempSrc: Oral  Resp: 16  Height: 5\' 4"  (1.626 m)  Weight: 240 lb (108.863 kg)  SpO2: 96%   Body mass index is 41.18 kg/(m^2).  General: A&O x 3, WD, morbidly obese female, seated in wheelchair Pulmonary: Sym exp, good air movt, CTAB, no rales, rhonchi, & wheezing  Cardiac: RRR, Nl S1, S2, no Murmur detected   Vascular:  Vessel  Right  Left   Radial  2+Palpable  2+Palpable   Carotid  without bruit  without bruit   Aorta  Not palpable  N/A   Femoral  Not Palpable due to pannus  Not Palpable due to pannus   Popliteal  Not palpable  Not palpable   PT  Not Palpable  Not Palpable   DP  2+Palpable  2+Palpable    Gastrointestinal: soft, NTND, -G/R, - HSM, - masses, - CVAT B  Musculoskeletal: M/S 4/5 throughout , B edema 2+, no ulcers. Neurologic: Pain and light touch intact in extremities except decreased in feet, Motor exam as listed above    Non-Invasive Vascular Imaging: DATE: 04/24/2014 ABI: RIGHT: [place], TBI: 0.72, Waveforms: monophasic PT, triphasic DP;  LEFT: [place], TBI: 0.85, Waveforms: triphasic   ASSESSMENT: [name] is a 80 y.o. female who presents with  healing uclers, PAD, and chronic venous insufficiency. No open areas of skin on lower legs or  feet. Her toes are dusky in both feet but bilateral ABI's are normal, indicating the dusky color is from venous insufficiency. ABI's indicate triphasic waveforms in both LE's except for monophasic in right PT which indicates arterial occlusive disease int the right PT. Both legs have non compressible vessels, unable to obtain ABI's. Normal TBI's bilaterally.  PLAN:  I discussed in depth with the patient the nature of atherosclerosis, and emphasized the importance of maximal medical management including strict control of blood pressure, blood glucose, and lipid levels, obtaining regular exercise, and continued cessation of smoking.  The patient is aware that without maximal medical management the underlying atherosclerotic disease process will progress, limiting the benefit of any interventions.  Donn knee hight compression hose every morning, remove at bedtime. Keep pressure off heels at night. Nursing to resume layered compression dressings in her lower leg (s) should she again develop venous stasis ulcers. Keep legs elevated when not walking.  Based on the patient's vascular studies and examination, and after discussing with Dr. Bridgett Larsson, pt will return to clinic in 1 year for ABI's.   The patient was given information about PAD including signs, symptoms, treatment, what symptoms should prompt the patient to seek immediate medical care, and risk reduction measures to take.  Clemon Chambers, RN, MSN, FNP-C [facility] [employer] Phone: [phone]  Clinic MD: Bridgett Larsson  04/24/2014 10:49 AM

## 2014-04-24 NOTE — Patient Instructions (Addendum)
Venous Stasis or Chronic Venous Insufficiency  Chronic venous insufficiency, also called venous stasis, is a condition that affects the veins in the legs. The condition prevents blood from being pumped through these veins effectively. Blood may no longer be pumped effectively from the legs back to the heart. This condition can range from mild to severe. With proper treatment, you should be able to continue with an active life.  CAUSES   Chronic venous insufficiency occurs when the vein walls become stretched, weakened, or damaged or when valves within the vein are damaged. Some common causes of this include:  · High blood pressure inside the veins (venous hypertension).  · Increased blood pressure in the leg veins from long periods of sitting or standing.  · A blood clot that blocks blood flow in a vein (deep vein thrombosis).  · Inflammation of a superficial vein (phlebitis) that causes a blood clot to form.  RISK FACTORS  Various things can make you more likely to develop chronic venous insufficiency, including:  · Family history of this condition.  · Obesity.  · Pregnancy.  · Sedentary lifestyle.  · Smoking.  · Jobs requiring long periods of standing or sitting in one place.  · Being a certain age. Women in their 40s and 50s and men in their 70s are more likely to develop this condition.  SIGNS AND SYMPTOMS   Symptoms may include:   · Varicose veins.  · Skin breakdown or ulcers.  · Reddened or discolored skin on the leg.  · Brown, smooth, tight, and painful skin just above the ankle, usually on the inside surface (lipodermatosclerosis).  · Swelling.  DIAGNOSIS   To diagnose this condition, your health care provider will take a medical history and do a physical exam. The following tests may be ordered to confirm the diagnosis:  · Duplex ultrasound--A procedure that produces a picture of a blood vessel and nearby organs and also provides information on blood flow through the blood vessel.  · Plethysmography--A  procedure that tests blood flow.  · A venogram, or venography--A procedure used to look at the veins using X-ray and dye.  TREATMENT  The goals of treatment are to help you return to an active life and to minimize pain or disability. Treatment will depend on the severity of the condition. Medical procedures may be needed for severe cases. Treatment options may include:   · Use of compression stockings. These can help with symptoms and lower the chances of the problem getting worse, but they do not cure the problem.  · Sclerotherapy--A procedure involving an injection of a material that "dissolves" the damaged veins. Other veins in the network of blood vessels take over the function of the damaged veins.  · Surgery to remove the vein or cut off blood flow through the vein (vein stripping or laser ablation surgery).  · Surgery to repair a valve.  HOME CARE INSTRUCTIONS   · Wear compression stockings as directed by your health care provider.  · Only take over-the-counter or prescription medicines for pain, discomfort, or fever as directed by your health care provider.  · Follow up with your health care provider as directed.  SEEK MEDICAL CARE IF:   · You have redness, swelling, or increasing pain in the affected area.  · You see a red streak or line that extends up or down from the affected area.  · You have a breakdown or loss of skin in the affected area, even if the breakdown is   sudden numbness or weakness in the foot or ankle below the affected area, or you have trouble moving your foot or ankle.  You have a fever or persistent symptoms for more than 2-3 days.  You have a fever and your symptoms suddenly get worse. MAKE SURE YOU:   Understand these instructions.  Will watch your  condition.  Will get help right away if you are not doing well or get worse. Document Released: 11/20/2006 Document Revised: 05/07/2013 Document Reviewed: 03/24/2013 [facility] Patient Information 2015 [place], [place]. This information is not intended to replace advice given to you by your health care provider. Make sure you discuss any questions you have with your health care provider.  Peripheral Vascular Disease Peripheral Vascular Disease (PVD), also called Peripheral Arterial Disease (PAD), is a circulation problem caused by cholesterol (atherosclerotic plaque) deposits in the arteries. PVD commonly occurs in the lower extremities (legs) but it can occur in other areas of the body, such as your arms. The cholesterol buildup in the arteries reduces blood flow which can cause pain and other serious problems. The presence of PVD can place a person at risk for Coronary Artery Disease (CAD).  CAUSES  Causes of PVD can be many. It is usually associated with more than one risk factor such as:   High Cholesterol.  Smoking.  Diabetes.  Lack of exercise or inactivity.  High blood pressure (hypertension).  Obesity.  Family history. SYMPTOMS   When the lower extremities are affected, patients with PVD may experience:  Leg pain with exertion or physical activity. This is called INTERMITTENT CLAUDICATION. This may present as cramping or numbness with physical activity. The location of the pain is associated with the level of blockage. For example, blockage at the abdominal level (distal abdominal aorta) may result in buttock or hip pain. Lower leg arterial blockage may result in calf pain.  As PVD becomes more severe, pain can develop with less physical activity.  In people with severe PVD, leg pain may occur at rest.  Other PVD signs and symptoms:  Leg numbness or weakness.  Coldness in the affected leg or foot, especially when compared to the other leg.  A change in leg  color.  Patients with significant PVD are more prone to ulcers or sores on toes, feet or legs. These may take longer to heal or may reoccur. The ulcers or sores can become infected.  If signs and symptoms of PVD are ignored, gangrene may occur. This can result in the loss of toes or loss of an entire limb.  Not all leg pain is related to PVD. Other medical conditions can cause leg pain such as:  Blood clots (embolism) or Deep Vein Thrombosis.  Inflammation of the blood vessels (vasculitis).  Spinal stenosis. DIAGNOSIS  Diagnosis of PVD can involve several different types of tests. These can include:  Pulse Volume Recording Method (PVR). This test is simple, painless and does not involve the use of X-rays. PVR involves measuring and comparing the blood pressure in the arms and legs. An ABI (Ankle-Brachial Index) is calculated. The normal ratio of blood pressures is 1. As this number becomes smaller, it indicates more severe disease.  < 0.95 - indicates significant narrowing in one or more leg vessels.  <0.8 - there will usually be pain in the foot, leg or buttock with exercise.  <0.4 - will usually have pain in the legs at rest.  <0.25 - usually indicates limb threatening PVD.  Doppler detection of pulses in the  legs. This test is painless and checks to see if you have a pulses in your legs/feet.  A dye or contrast material (a substance that highlights the blood vessels so they show up on x-ray) may be given to help your caregiver better see the arteries for the following tests. The dye is eliminated from your body by the kidney's. Your caregiver may order blood work to check your kidney function and other laboratory values before the following tests are performed:  Magnetic Resonance Angiography (MRA). An MRA is a picture study of the blood vessels and arteries. The MRA machine uses a large magnet to produce images of the blood vessels.  Computed Tomography Angiography (CTA). A CTA is  a specialized x-ray that looks at how the blood flows in your blood vessels. An IV may be inserted into your arm so contrast dye can be injected.  Angiogram. Is a procedure that uses x-rays to look at your blood vessels. This procedure is minimally invasive, meaning a small incision (cut) is made in your groin. A small tube (catheter) is then inserted into the artery of your groin. The catheter is guided to the blood vessel or artery your caregiver wants to examine. Contrast dye is injected into the catheter. X-rays are then taken of the blood vessel or artery. After the images are obtained, the catheter is taken out. TREATMENT  Treatment of PVD involves many interventions which may include:  Lifestyle changes:  Quitting smoking.  Exercise.  Following a low fat, low cholesterol diet.  Control of diabetes.  Foot care is very important to the PVD patient. Good foot care can help prevent infection.  Medication:  Cholesterol-lowering medicine.  Blood pressure medicine.  Anti-platelet drugs.  Certain medicines may reduce symptoms of Intermittent Claudication.  Interventional/Surgical options:  Angioplasty. An Angioplasty is a procedure that inflates a balloon in the blocked artery. This opens the blocked artery to improve blood flow.  Stent Implant. A wire mesh tube (stent) is placed in the artery. The stent expands and stays in place, allowing the artery to remain open.  Peripheral Bypass Surgery. This is a surgical procedure that reroutes the blood around a blocked artery to help improve blood flow. This type of procedure may be performed if Angioplasty or stent implants are not an option. SEEK IMMEDIATE MEDICAL CARE IF:   You develop pain or numbness in your arms or legs.  Your arm or leg turns cold, becomes blue in color.  You develop redness, warmth, swelling and pain in your arms or legs. MAKE SURE YOU:   Understand these instructions.  Will watch your  condition.  Will get help right away if you are not doing well or get worse. Document Released: 08/24/2004 Document Revised: 10/09/2011 Document Reviewed: 07/21/2008 [facility] Patient Information 2015 [place], [place]. This information is not intended to replace advice given to you by your health care provider. Make sure you discuss any questions you have with your health care provider.

## 2014-04-29 ENCOUNTER — Ambulatory Visit: Payer: Medicare HMO | Admitting: Cardiovascular Disease

## 2014-05-01 ENCOUNTER — Ambulatory Visit (INDEPENDENT_AMBULATORY_CARE_PROVIDER_SITE_OTHER): Payer: Medicare HMO | Admitting: Cardiovascular Disease

## 2014-05-01 ENCOUNTER — Encounter: Payer: Self-pay | Admitting: Cardiovascular Disease

## 2014-05-01 VITALS — BP 132/78 | HR 62 | Ht 64.0 in | Wt 236.2 lb

## 2014-05-01 DIAGNOSIS — R0601 Orthopnea: Secondary | ICD-10-CM

## 2014-05-01 DIAGNOSIS — I482 Chronic atrial fibrillation, unspecified: Secondary | ICD-10-CM

## 2014-05-01 DIAGNOSIS — I5033 Acute on chronic diastolic (congestive) heart failure: Secondary | ICD-10-CM

## 2014-05-01 DIAGNOSIS — R001 Bradycardia, unspecified: Secondary | ICD-10-CM

## 2014-05-01 DIAGNOSIS — Z950 Presence of cardiac pacemaker: Secondary | ICD-10-CM

## 2014-05-01 DIAGNOSIS — N189 Chronic kidney disease, unspecified: Secondary | ICD-10-CM

## 2014-05-01 DIAGNOSIS — I739 Peripheral vascular disease, unspecified: Secondary | ICD-10-CM

## 2014-05-01 DIAGNOSIS — I25812 Atherosclerosis of bypass graft of coronary artery of transplanted heart without angina pectoris: Secondary | ICD-10-CM

## 2014-05-01 DIAGNOSIS — G4719 Other hypersomnia: Secondary | ICD-10-CM

## 2014-05-01 DIAGNOSIS — I10 Essential (primary) hypertension: Secondary | ICD-10-CM

## 2014-05-01 NOTE — Progress Notes (Signed)
Patient ID: [name], female   DOB: 05-27-1933, 80 y.o.   MRN: [id number]      SUBJECTIVE: The patient presents for routine cardiovascular followup. She has a complex medical and cardiovascular history which includes coronary artery disease and CABG, atrial fibrillation, pacemaker for SSS in 09/2007, severe peripheral vascular disease, chronic diastolic heart failure, chronic venous insufficiency, insulin-dependent diabetes mellitus, hypothyroidism and hyperlipidemia. She underwent 4 vessel CABG in 2009 with a LIMA to the LAD, and saphenous vein grafts to the first diagonal, obtuse marginal, and distal RCA. She had an echocardiogram on 09/02/2012 which revealed an ejection fraction of 55-65% with moderate LVH, mild aortic regurgitation, moderate mitral regurgitation, and moderate left atrial enlargement. She follows with Dr. Bridgett Larsson for her peripheral vascular disease.  Her primary complaint is daytime sleepiness. Labs in April 2015 demonstrated BUN 41, creatinine 1.94, TSH 4.05, and free T4-1 0.24. She believes her legs are not any more swollen than usual. She cannot lie flat but denies shortness of breath per se. She sleeps with 3 pillows. She denies chest pain. She resides in a nursing home.   Review of Systems: As per "subjective", otherwise negative.  Allergies  Allergen Reactions  . Penicillins Other (See Comments)    Pt. Not sure of reaction.    Current Outpatient Prescriptions  Medication Sig Dispense Refill  . acetaminophen (TYLENOL) 650 MG suppository Place 650 mg rectally every 6 (six) hours as needed.      Marland Kitchen albuterol (PROVENTIL) (2.5 MG/3ML) 0.083% nebulizer solution Take 2.5 mg by nebulization every 6 (six) hours as needed for wheezing or shortness of breath.      . ALPRAZolam (XANAX) 0.25 MG tablet Take 0.25 mg by mouth 3 (three) times daily as needed for anxiety.      Marland Kitchen aspirin 81 MG tablet Take 81 mg by mouth daily.       . budesonide (PULMICORT) 0.5 MG/2ML nebulizer  solution Take 0.5 mg by nebulization 2 (two) times daily.      . calcium-vitamin D (OSCAL WITH D) 500-200 MG-UNIT per tablet Take 1 tablet by mouth 2 (two) times daily.      . cholecalciferol (VITAMIN D) 1000 UNITS tablet Take 1,000 Units by mouth daily.      . citalopram (CELEXA) 20 MG tablet Take 20 mg by mouth every morning.      . docusate sodium (COLACE) 100 MG capsule Take 100 mg by mouth 2 (two) times daily.      . fentaNYL (DURAGESIC - DOSED MCG/HR) 25 MCG/HR patch Place 25 mcg onto the skin every 3 (three) days.      . folic acid (FOLVITE) 1 MG tablet Take 1 mg by mouth 2 (two) times daily.      Marland Kitchen FORA V12 BLOOD GLUCOSE TEST test strip       . furosemide (LASIX) 80 MG tablet Take 80 mg by mouth 3 (three) times daily.      . insulin aspart (NOVOLOG) 100 UNIT/ML FlexPen Inject into the skin 3 (three) times daily with meals. Sliding scale      . iron polysaccharides (NIFEREX) 150 MG capsule Take 150 mg by mouth 2 (two) times daily.      Marland Kitchen levothyroxine (SYNTHROID, LEVOTHROID) 125 MCG tablet Take 125 mcg by mouth daily before breakfast.      . magnesium oxide (MAG-OX) 400 MG tablet Take 400 mg by mouth every morning.      . metoprolol succinate (TOPROL-XL) 25 MG 24 hr tablet Take 25  mg by mouth 2 (two) times daily.       Marland Kitchen NITROSTAT 0.4 MG SL tablet       . NOVOFINE AUTOCOVER 30G X 8 MM MISC       . pantoprazole (PROTONIX) 40 MG tablet Take 40 mg by mouth every morning.      . polyethylene glycol (MIRALAX / GLYCOLAX) packet Take 17 g by mouth daily.      . potassium chloride SA (K-DUR,KLOR-CON) 20 MEQ tablet Take 20 mEq by mouth 2 (two) times daily.       . pravastatin (PRAVACHOL) 40 MG tablet Take 40 mg by mouth every evening.       . Probiotic Product (PROBIOTIC DAILY PO) Take 1 tablet by mouth every evening.      . traMADol (ULTRAM) 50 MG tablet Take 50 mg by mouth every 4 (four) hours as needed for moderate pain.       Marland Kitchen warfarin (COUMADIN) 4 MG tablet Take 4 mg by mouth as directed.       . warfarin (COUMADIN) 5 MG tablet Take 5 mg by mouth as directed. TUESDAY, Thursday, SAT, SUN       No current facility-administered medications for this visit.    Past Medical History  Diagnosis Date  . Anemia 2008  . DM (diabetes mellitus) 1999  . Cataract     bilaterla   . Permanent atrial fibrillation   . CAD (coronary artery disease)   . CRI (chronic renal insufficiency) 2008  . Hypertension   . Hyperlipemia   . GERD (gastroesophageal reflux disease)   . Arthritis   . Acute respiratory failure with hypoxia 06/12/2013    secondary to acute on chronic diastolic failure.  . Wrist fracture, left 06/13/2013  . Acute on chronic congestive heart failure with left ventricular diastolic dysfunction 58/85/0277  . Hx of CABG 10/24/2007    Dr. Roxan Hockey  . S/P CABG x 4   . Venous insufficiency   . Pacemaker 10/24/2007    Medtronic EnRhythm; implanted for SSS, symptomatic bradycardia, PAF  . Fracture November 2014    Left wrist  . Obesity     Past Surgical History  Procedure Laterality Date  . Esophagogastroduodenoscopy  11/07/2007    AJO:INOMVE Schatzki's ring.  Otherwise normal esophagus   . Appendectomy      APPENDICITIS  . Coronary artery bypass graft  10/24/2007    LIMA to LAD, SVG to DX1, SVG to OM, SVG to distal RCA (Dr. Roxan Hockey)  . Pacemaker insertion  10/24/2007    MDT EnRhythm implanted by Dr Rollene Fare  . Total knee arthroplasty Right     2012  . Coronary angioplasty  11/16/2003    PCI to prox mid RCA (2.75x42mm chromium cobalt vision Guidant stent) (Dr. Marella Chimes)  . Back surgery    . Tonsillectomy      age 28  . Insert / replace / remove pacemaker    . Joint replacement Right   . Colonoscopy with esophagogastroduodenoscopy (egd) N/A 11/25/2012    SLF:Two SMALL AC polyps/Small internal hemorrhoids/The colon IS SLIGHTLY redundant-EGD:Schatzki ring at the gastroesophageal junction/Small hiatal hernia/MILD Non-erosive gastritis  . Transthoracic  echocardiogram  08/2012    EF 55-65%, mod conc hypertrophy; mild AV regurg; calcified MV annulus with mod MR; LA mod diated; RV systolic pressure increased; RA mildly dilated; PA peak pressure 76mmHg  . Nm myocar perf wall motion  2011    dipyridamole myoview - moderate ischemai in mid anteroseptal basal inferior inferioer,  mid lateral and basal inferolateral region; abnormal study, EF 41%; high risk scan  . Cardiac catheterization  08/08/2007    3 vessel CAD (LAD, mid Cfx, mid RCA) (Dr. Marella Chimes)  . Eye surgery Left Aug. 2015    History   Social History  . Marital Status: Widowed    Spouse Name: N/A    Number of Children: 4  . Years of Education: N/A   Occupational History  .     Social History Main Topics  . Smoking status: Former Smoker -- 1.00 packs/day for 4 years    Types: Cigarettes    Quit date: 10/20/1999  . Smokeless tobacco: Never Used  . Alcohol Use: No  . Drug Use: No  . Sexual Activity: No   Other Topics Concern  . Not on file   Social History Narrative   Nursing home resident    BP 132/78 Pulse 62  SpO2 95% Weight 236 lb 4 oz (107.162 kg) Height 5\' 4"  (1.626 m)    PHYSICAL EXAM General: NAD  Neck: No JVD, no thyromegaly.  Lungs: Crackles 1/4 up bilaterally with normal respiratory effort.  CV: Nondisplaced PMI. Irregular rhythm, normal rate, normal S1/S2, no S3, no murmur. Legs are bandaged and swollen. No carotid bruit.  Abdomen: Soft, nontender, no hepatosplenomegaly, obese.  Neurologic: Alert and oriented x 3.  Psych: Normal affect. Skin: Normal. Musculoskeletal: Normal range of motion, no gross deformities. Extremities: No clubbing or cyanosis.   ECG: Most recent ECG reviewed.    ASSESSMENT AND PLAN: 1. CAD/CABG: She appears to be symptomatically stable. Continue aspirin, beta blocker, and pravastatin.  2. Permanent atrial fibrillation: Rate is controlled and on warfarin, and INR managed by pharmacy. Her heart rate is controlled on  Toprol-XL 25 mg daily.  3. Pacemaker for SSS: Normal device function on 04/17/2014. Patient is in atrial fibrillation 100% of the time with no high ventricular rates noted. 4. Acute on chronic diastolic heart failure: She has crackles on exam and c/o orthopnea. She also has concomitant CKD, with cardiorenal syndrome. Will obtain a chest xray and check a BMET. 5. Essential HTN: Controlled on present therapy. 6. Peripheral vascular disease: Follows with Dr. Bridgett Larsson.  7. Daytime sleepiness: Will d/c citalopram.  Dispo: f/u 6 months.    Kate Sable, M.D., F.A.C.C.

## 2014-05-01 NOTE — Patient Instructions (Signed)
   Chest x-ray (PA & Lat)  Lab for BMET  Office will contact with results via phone or letter.  MD will make further recommendations after review results from above.   Stop Citalopram Continue all other medications.   Your physician wants you to follow up in: 6 months.  You will receive a reminder letter in the mail one-two months in advance.  If you don't receive a letter, please call our office to schedule the follow up appointment

## 2014-05-04 ENCOUNTER — Other Ambulatory Visit: Payer: Self-pay | Admitting: *Deleted

## 2014-05-04 DIAGNOSIS — N189 Chronic kidney disease, unspecified: Secondary | ICD-10-CM

## 2014-05-04 DIAGNOSIS — R0601 Orthopnea: Secondary | ICD-10-CM

## 2014-07-22 ENCOUNTER — Encounter: Payer: Self-pay | Admitting: Cardiovascular Disease

## 2014-08-27 ENCOUNTER — Telehealth: Payer: Self-pay | Admitting: Internal Medicine

## 2014-08-27 NOTE — Telephone Encounter (Signed)
08-27-14 lmm @ 417 pm to have rebecca in transportation at [facility] set up device check in [place] , offered 10-16-14/mt

## 2014-10-05 ENCOUNTER — Ambulatory Visit (INDEPENDENT_AMBULATORY_CARE_PROVIDER_SITE_OTHER): Payer: Medicare HMO | Admitting: Cardiovascular Disease

## 2014-10-05 ENCOUNTER — Encounter: Payer: Self-pay | Admitting: Cardiovascular Disease

## 2014-10-05 ENCOUNTER — Encounter: Payer: Self-pay | Admitting: *Deleted

## 2014-10-05 VITALS — BP 148/78 | HR 64 | Ht 64.0 in | Wt 242.0 lb

## 2014-10-05 DIAGNOSIS — I10 Essential (primary) hypertension: Secondary | ICD-10-CM

## 2014-10-05 DIAGNOSIS — I5033 Acute on chronic diastolic (congestive) heart failure: Secondary | ICD-10-CM

## 2014-10-05 DIAGNOSIS — I5032 Chronic diastolic (congestive) heart failure: Secondary | ICD-10-CM

## 2014-10-05 DIAGNOSIS — I482 Chronic atrial fibrillation, unspecified: Secondary | ICD-10-CM

## 2014-10-05 DIAGNOSIS — R0602 Shortness of breath: Secondary | ICD-10-CM

## 2014-10-05 DIAGNOSIS — Z01818 Encounter for other preprocedural examination: Secondary | ICD-10-CM

## 2014-10-05 DIAGNOSIS — I739 Peripheral vascular disease, unspecified: Secondary | ICD-10-CM

## 2014-10-05 DIAGNOSIS — Z950 Presence of cardiac pacemaker: Secondary | ICD-10-CM

## 2014-10-05 DIAGNOSIS — I25812 Atherosclerosis of bypass graft of coronary artery of transplanted heart without angina pectoris: Secondary | ICD-10-CM

## 2014-10-05 NOTE — Patient Instructions (Signed)
Your physician has requested that you have a lexiscan myoview. For further information please visit [email or web address]. Please follow instruction sheet, as given. Office will contact with results via phone or letter.   Continue all current medications. Your physician wants you to follow up in: 6 months.  You will receive a reminder letter in the mail one-two months in advance.  If you don't receive a letter, please call our office to schedule the follow up appointment    

## 2014-10-05 NOTE — Progress Notes (Signed)
Patient ID: [name], female   DOB: 10-24-1932, 81 y.o.   MRN: [id number]      SUBJECTIVE: The patient presents for routine cardiovascular followup. She has a complex medical and cardiovascular history which includes coronary artery disease and CABG, atrial fibrillation, pacemaker for sick sinus syndrome in 09/2007, severe peripheral vascular disease, chronic diastolic heart failure, chronic venous insufficiency, insulin-dependent diabetes mellitus, hypothyroidism and hyperlipidemia. She underwent 4 vessel CABG in 2009 with a LIMA to the LAD, and saphenous vein grafts to the first diagonal, obtuse marginal, and distal RCA. She had an echocardiogram on 09/02/2012 which revealed an ejection fraction of 55-65% with moderate LVH, mild aortic regurgitation, moderate mitral regurgitation, and moderate left atrial enlargement. She follows with Dr. Bridgett Larsson for her peripheral vascular disease. Last nuclear stress test was in 2011.  She is being scheduled to undergo a left knee replacement. She previously underwent a right knee replacement. She requires Lasix 80 mg tid to help alleviate leg swelling.  She denies chest pain, palpitations, and very seldom has shortness of breath.   ECG performed in the office today demonstrates a ventricular paced rhythm with underlying atrial fibrillation and PVCs.  Review of Systems: As per "subjective", otherwise negative.  Allergies  Allergen Reactions  . Penicillins Other (See Comments)    Pt. Not sure of reaction.    Current Outpatient Prescriptions  Medication Sig Dispense Refill  . acetaminophen (TYLENOL) 650 MG suppository Place 650 mg rectally every 6 (six) hours as needed.    Marland Kitchen albuterol (PROVENTIL) (2.5 MG/3ML) 0.083% nebulizer solution Take 2.5 mg by nebulization every 6 (six) hours as needed for wheezing or shortness of breath.    . ALPRAZolam (XANAX) 0.25 MG tablet Take 0.25 mg by mouth 3 (three) times daily as needed for anxiety.    Marland Kitchen aspirin 81 MG  tablet Take 81 mg by mouth daily.     . budesonide (PULMICORT) 0.5 MG/2ML nebulizer solution Take 0.5 mg by nebulization 2 (two) times daily.    . calcium-vitamin D (OSCAL WITH D) 500-200 MG-UNIT per tablet Take 1 tablet by mouth 2 (two) times daily.    . cholecalciferol (VITAMIN D) 1000 UNITS tablet Take 3,000 Units by mouth daily.    Marland Kitchen docusate sodium (COLACE) 100 MG capsule Take 100 mg by mouth 2 (two) times daily.    . fentaNYL (DURAGESIC - DOSED MCG/HR) 25 MCG/HR patch Place 25 mcg onto the skin every 3 (three) days.    . folic acid (FOLVITE) 1 MG tablet Take 1 mg by mouth 2 (two) times daily.    Marland Kitchen FORA V12 BLOOD GLUCOSE TEST test strip     . furosemide (LASIX) 80 MG tablet Take 80 mg by mouth 3 (three) times daily.    . insulin aspart (NOVOLOG) 100 UNIT/ML FlexPen Inject into the skin 3 (three) times daily with meals. Sliding scale    . iron polysaccharides (NIFEREX) 150 MG capsule Take 150 mg by mouth 2 (two) times daily.    Marland Kitchen levothyroxine (SYNTHROID, LEVOTHROID) 125 MCG tablet Take 125 mcg by mouth daily before breakfast.    . magnesium oxide (MAG-OX) 400 MG tablet Take 400 mg by mouth every morning.    . metoprolol succinate (TOPROL-XL) 25 MG 24 hr tablet Take 25 mg by mouth 2 (two) times daily.     Marland Kitchen NITROSTAT 0.4 MG SL tablet     . NOVOFINE AUTOCOVER 30G X 8 MM MISC     . polyethylene glycol (MIRALAX / GLYCOLAX) packet  Take 17 g by mouth daily.    . potassium chloride SA (K-DUR,KLOR-CON) 20 MEQ tablet Take 20 mEq by mouth 2 (two) times daily.     . pravastatin (PRAVACHOL) 40 MG tablet Take 40 mg by mouth every evening.     . Probiotic Product (PROBIOTIC DAILY PO) Take 1 tablet by mouth every evening.    . traMADol (ULTRAM) 50 MG tablet Take 50 mg by mouth every 4 (four) hours as needed for moderate pain.     Marland Kitchen VITAMIN D, CHOLECALCIFEROL, PO Take 500 mg by mouth daily.    Marland Kitchen warfarin (COUMADIN) 4 MG tablet Take 4 mg by mouth as directed.    . warfarin (COUMADIN) 5 MG tablet Take 5 mg  by mouth as directed. TUESDAY, Thursday, SAT, SUN     No current facility-administered medications for this visit.    Past Medical History  Diagnosis Date  . Anemia 2008  . DM (diabetes mellitus) 1999  . Cataract     bilaterla   . Permanent atrial fibrillation   . CAD (coronary artery disease)   . CRI (chronic renal insufficiency) 2008  . Hypertension   . Hyperlipemia   . GERD (gastroesophageal reflux disease)   . Arthritis   . Acute respiratory failure with hypoxia 06/12/2013    secondary to acute on chronic diastolic failure.  . Wrist fracture, left 06/13/2013  . Acute on chronic congestive heart failure with left ventricular diastolic dysfunction 98/33/8250  . Hx of CABG 10/24/2007    Dr. Roxan Hockey  . S/P CABG x 4   . Venous insufficiency   . Pacemaker 10/24/2007    Medtronic EnRhythm; implanted for SSS, symptomatic bradycardia, PAF  . Fracture November 2014    Left wrist  . Obesity     Past Surgical History  Procedure Laterality Date  . Esophagogastroduodenoscopy  11/07/2007    NLZ:JQBHAL Schatzki's ring.  Otherwise normal esophagus   . Appendectomy      APPENDICITIS  . Coronary artery bypass graft  10/24/2007    LIMA to LAD, SVG to DX1, SVG to OM, SVG to distal RCA (Dr. Roxan Hockey)  . Pacemaker insertion  10/24/2007    MDT EnRhythm implanted by Dr Rollene Fare  . Total knee arthroplasty Right     2012  . Coronary angioplasty  11/16/2003    PCI to prox mid RCA (2.75x76mm chromium cobalt vision Guidant stent) (Dr. Marella Chimes)  . Back surgery    . Tonsillectomy      age 65  . Insert / replace / remove pacemaker    . Joint replacement Right   . Colonoscopy with esophagogastroduodenoscopy (egd) N/A 11/25/2012    SLF:Two SMALL AC polyps/Small internal hemorrhoids/The colon IS SLIGHTLY redundant-EGD:Schatzki ring at the gastroesophageal junction/Small hiatal hernia/MILD Non-erosive gastritis  . Transthoracic echocardiogram  08/2012    EF 55-65%, mod conc hypertrophy;  mild AV regurg; calcified MV annulus with mod MR; LA mod diated; RV systolic pressure increased; RA mildly dilated; PA peak pressure 61mmHg  . Nm myocar perf wall motion  2011    dipyridamole myoview - moderate ischemai in mid anteroseptal basal inferior inferioer, mid lateral and basal inferolateral region; abnormal study, EF 41%; high risk scan  . Cardiac catheterization  08/08/2007    3 vessel CAD (LAD, mid Cfx, mid RCA) (Dr. Marella Chimes)  . Eye surgery Left Aug. 2015    History   Social History  . Marital Status: Widowed    Spouse Name: N/A  . Number of Children:  4  . Years of Education: N/A   Occupational History  .     Social History Main Topics  . Smoking status: Former Smoker -- 1.00 packs/day for 4 years    Types: Cigarettes    Quit date: 10/20/1999  . Smokeless tobacco: Never Used  . Alcohol Use: No  . Drug Use: No  . Sexual Activity: No   Other Topics Concern  . Not on file   Social History Narrative   Nursing home resident     Danley Danker Vitals:   10/05/14 1507  Height: 5\' 4"  (1.626 m)  Weight: 242 lb (109.77 kg)   BP 148/78  Pulse 64 SpO2 93%   PHYSICAL EXAM General: NAD  Neck: No JVD, no thyromegaly.  Lungs: Dry crackles 1/4 up bilaterally with normal respiratory effort (chronic).  CV: Nondisplaced PMI. Irregular rhythm, normal rate, normal S1/S2, no S3, no murmur. Legs are bandaged and swollen.  Abdomen: Soft, nontender, obese.  Neurologic: Alert and oriented.  Psych: Normal affect. Skin: Normal. Musculoskeletal: No gross deformities.    ECG: Most recent ECG reviewed.      ASSESSMENT AND PLAN: 1. CAD/CABG: She remains symptomatically stable. Continue aspirin, beta blocker, and pravastatin.  2. Permanent atrial fibrillation: Rate is controlled and on warfarin, and INR managed by pharmacy. Her heart rate is controlled on Toprol-XL 25 mg daily.  3. Pacemaker for SSS: Normal device function on 04/17/2014. Patient is in atrial fibrillation  100% of the time with no high ventricular rates noted. 4. Chronic diastolic heart failure: No changes to therapy. On high dose diuretics. O2 sats are normal. 5. Essential HTN: Reasonably controlled for age on present therapy. No changes. 6. Peripheral vascular disease: Follows with Dr. Bridgett Larsson.  7. Preoperative risk stratification: She has multiple comorbidities which would give her at least an intermediate risk for a major adverse cardiac event, but not prohibitive. She had normal LV systolic function when last assessed. Unable to quantify her exercise tolerance. Will obtain a nuclear stress test to assess ischemic burden. Continue metoprolol succinate to help attenuate perioperative risk.  Dispo: f/u 6 months.  Kate Sable, M.D., F.A.C.C.

## 2014-10-16 ENCOUNTER — Ambulatory Visit (INDEPENDENT_AMBULATORY_CARE_PROVIDER_SITE_OTHER): Payer: Medicare HMO | Admitting: *Deleted

## 2014-10-16 DIAGNOSIS — I482 Chronic atrial fibrillation, unspecified: Secondary | ICD-10-CM

## 2014-10-16 LAB — MDC_IDC_ENUM_SESS_TYPE_INCLINIC
Battery Voltage: 2.92 V
Lead Channel Pacing Threshold Amplitude: 1 V
Lead Channel Pacing Threshold Pulse Width: 0.4 ms
Lead Channel Sensing Intrinsic Amplitude: 1.967
Lead Channel Sensing Intrinsic Amplitude: 18.8352
Lead Channel Setting Pacing Amplitude: 2.5 V
Lead Channel Setting Pacing Pulse Width: 0.4 ms
MDC IDC MSMT LEADCHNL RA IMPEDANCE VALUE: 640 Ohm
MDC IDC MSMT LEADCHNL RV IMPEDANCE VALUE: 440 Ohm
MDC IDC SESS DTM: 20160318132237
MDC IDC SET LEADCHNL RV SENSING SENSITIVITY: 0.9 mV
MDC IDC STAT BRADY RV PERCENT PACED: 39.68 %
Zone Setting Detection Interval: 350 ms
Zone Setting Detection Interval: 400 ms

## 2014-10-16 NOTE — Progress Notes (Signed)
Normal device function Battery voltage 2.92 V. No changes made. ROV 05-07-15 @ 1100 with JA/[place].

## 2014-10-19 ENCOUNTER — Encounter (HOSPITAL_COMMUNITY)
Admission: RE | Admit: 2014-10-19 | Discharge: 2014-10-19 | Disposition: A | Discharge status: Home / Self Care | Payer: Medicare HMO | Source: Ambulatory Visit | Attending: Cardiovascular Disease | Admitting: Cardiovascular Disease

## 2014-10-19 ENCOUNTER — Encounter (HOSPITAL_COMMUNITY): Payer: Self-pay

## 2014-10-19 ENCOUNTER — Ambulatory Visit (HOSPITAL_COMMUNITY)
Admission: RE | Admit: 2014-10-19 | Discharge: 2014-10-19 | Disposition: A | Discharge status: Home / Self Care | Payer: Medicare HMO | Source: Ambulatory Visit | Attending: Cardiovascular Disease | Admitting: Cardiovascular Disease

## 2014-10-19 DIAGNOSIS — Z01810 Encounter for preprocedural cardiovascular examination: Secondary | ICD-10-CM

## 2014-10-19 DIAGNOSIS — Z01818 Encounter for other preprocedural examination: Secondary | ICD-10-CM | POA: Insufficient documentation

## 2014-10-19 DIAGNOSIS — I2510 Atherosclerotic heart disease of native coronary artery without angina pectoris: Secondary | ICD-10-CM | POA: Diagnosis not present

## 2014-10-19 DIAGNOSIS — R0602 Shortness of breath: Secondary | ICD-10-CM | POA: Diagnosis not present

## 2014-10-19 MED ORDER — SODIUM CHLORIDE 0.9 % IJ SOLN
10.0000 mL | INTRAMUSCULAR | Status: DC | PRN
  Administered 2014-10-19: 10 mL via INTRAVENOUS
  Filled 2014-10-19: qty 10

## 2014-10-19 MED ORDER — TECHNETIUM TC 99M SESTAMIBI GENERIC - CARDIOLITE
10.0000 | Freq: Once | INTRAVENOUS | Status: AC | PRN
  Administered 2014-10-19: 10 via INTRAVENOUS

## 2014-10-19 MED ORDER — REGADENOSON 0.4 MG/5ML IV SOLN
0.4000 mg | Freq: Once | INTRAVENOUS | Status: AC | PRN
  Administered 2014-10-19: 0.4 mg via INTRAVENOUS

## 2014-10-19 MED ORDER — REGADENOSON 0.4 MG/5ML IV SOLN
INTRAVENOUS | Status: AC
  Filled 2014-10-19: qty 5

## 2014-10-19 MED ORDER — TECHNETIUM TC 99M SESTAMIBI - CARDIOLITE
30.0000 | Freq: Once | INTRAVENOUS | Status: AC | PRN
  Administered 2014-10-19: 30 via INTRAVENOUS

## 2014-10-19 MED ORDER — SODIUM CHLORIDE 0.9 % IJ SOLN
INTRAMUSCULAR | Status: AC
  Filled 2014-10-19: qty 3

## 2014-10-19 NOTE — Progress Notes (Signed)
Stress Lab Nurses Notes - [facility]  EVANGELYNN LOCHRIDGE 10/19/2014 Reason for doing test: CAD and Pre-Op Evaluation Type of test: Leane Call Nurse performing test: Felicity Coyer, RN Nuclear Medicine Tech: Redmond Baseman Echo Tech: Not Applicable MD performing test: Dr. Lestine Mount. Purcell Nails, NP Family MD: Dr. Gerarda Fraction Test explained and consent signed: Yes.   IV started: Saline lock flushed, No redness or edema and Saline lock started in radiology Symptoms:  Mild head flushing Treatment/Intervention: None Reason test stopped: protocol completed After recovery IV was: Discontinued via X-ray tech Patient to return to [place]. Med at :  1240 Patient discharged: Home Patient's Condition upon discharge was: stable Comments: During test BP 162/68 & HR 89.  In recovery BP 157/65 & HR 81.  Pt reports sx resolved during recovery.  Gus Rankin

## 2014-10-20 ENCOUNTER — Telehealth: Payer: Self-pay | Admitting: *Deleted

## 2014-10-20 NOTE — Telephone Encounter (Signed)
Notes Recorded by Laurine Blazer, LPN on 5/67/0141 at 03:01 AM Notified Larene Beach at [facility]. Scheduled f/u for 10/28/2014 at 9:40 with Dr. Bronson Ing.

## 2014-10-20 NOTE — Telephone Encounter (Signed)
Notes Recorded by Herminio Commons, MD on 10/19/2014 at 4:11 PM Will need to discuss at f/u ov. Would recommend I see her within the next few weeks.

## 2014-10-28 ENCOUNTER — Encounter: Payer: Self-pay | Admitting: Cardiovascular Disease

## 2014-10-28 ENCOUNTER — Ambulatory Visit (INDEPENDENT_AMBULATORY_CARE_PROVIDER_SITE_OTHER): Payer: Medicare HMO | Admitting: Cardiovascular Disease

## 2014-10-28 ENCOUNTER — Ambulatory Visit (INDEPENDENT_AMBULATORY_CARE_PROVIDER_SITE_OTHER): Payer: Medicare HMO

## 2014-10-28 VITALS — BP 142/72 | HR 68 | Ht 64.0 in | Wt 245.0 lb

## 2014-10-28 DIAGNOSIS — I482 Chronic atrial fibrillation, unspecified: Secondary | ICD-10-CM

## 2014-10-28 DIAGNOSIS — I5033 Acute on chronic diastolic (congestive) heart failure: Secondary | ICD-10-CM | POA: Diagnosis not present

## 2014-10-28 DIAGNOSIS — I10 Essential (primary) hypertension: Secondary | ICD-10-CM

## 2014-10-28 DIAGNOSIS — I739 Peripheral vascular disease, unspecified: Secondary | ICD-10-CM

## 2014-10-28 DIAGNOSIS — I5032 Chronic diastolic (congestive) heart failure: Secondary | ICD-10-CM

## 2014-10-28 DIAGNOSIS — Z01818 Encounter for other preprocedural examination: Secondary | ICD-10-CM | POA: Diagnosis not present

## 2014-10-28 DIAGNOSIS — R931 Abnormal findings on diagnostic imaging of heart and coronary circulation: Secondary | ICD-10-CM | POA: Diagnosis not present

## 2014-10-28 DIAGNOSIS — I4891 Unspecified atrial fibrillation: Secondary | ICD-10-CM

## 2014-10-28 DIAGNOSIS — I25812 Atherosclerosis of bypass graft of coronary artery of transplanted heart without angina pectoris: Secondary | ICD-10-CM

## 2014-10-28 DIAGNOSIS — N189 Chronic kidney disease, unspecified: Secondary | ICD-10-CM

## 2014-10-28 DIAGNOSIS — Z950 Presence of cardiac pacemaker: Secondary | ICD-10-CM

## 2014-10-28 NOTE — Progress Notes (Signed)
Patient ID: [name], female   DOB: [birth year], 81 y.o.   MRN: [id number]      SUBJECTIVE: The patient presents for follow-up after undergoing nuclear stress testing for preoperative risk stratification prior to knee surgery. Myocardial perfusion images demonstrated a mild to moderate amount of anteroapical and anterolateral wall ischemia with anterolateral wall hypokinesis. Left ventricular systolic function was moderately reduced, calculated LVEF 37%. Echocardiogram on 09/02/2012 revealed an ejection fraction of 55-65%. She denies chest pain, palpitations, and very seldom has shortness of breath. She walks up and down the hallway without cardiac symptoms. Her legs feel a little more swollen and she is soon due for her Lasix.    Review of Systems: As per "subjective", otherwise negative.  Allergies  Allergen Reactions  . Penicillins Other (See Comments)    Pt. Not sure of reaction.    Current Outpatient Prescriptions  Medication Sig Dispense Refill  . acetaminophen (TYLENOL) 650 MG suppository Place 650 mg rectally every 6 (six) hours as needed.    Marland Kitchen albuterol (PROVENTIL) (2.5 MG/3ML) 0.083% nebulizer solution Take 2.5 mg by nebulization every 6 (six) hours as needed for wheezing or shortness of breath.    . ALPRAZolam (XANAX) 0.25 MG tablet Take 0.25 mg by mouth 3 (three) times daily as needed for anxiety.    Marland Kitchen aspirin 81 MG tablet Take 81 mg by mouth daily.     . budesonide (PULMICORT) 0.5 MG/2ML nebulizer solution Take 0.5 mg by nebulization 2 (two) times daily.    . calcium-vitamin D (OSCAL WITH D) 500-200 MG-UNIT per tablet Take 1 tablet by mouth 2 (two) times daily.    . cholecalciferol (VITAMIN D) 1000 UNITS tablet Take 3,000 Units by mouth daily.    Marland Kitchen docusate sodium (COLACE) 100 MG capsule Take 100 mg by mouth 2 (two) times daily.    . fentaNYL (DURAGESIC - DOSED MCG/HR) 25 MCG/HR patch Place 25 mcg onto the skin every 3 (three) days.    . folic acid (FOLVITE) 1 MG  tablet Take 1 mg by mouth 2 (two) times daily.    Marland Kitchen FORA V12 BLOOD GLUCOSE TEST test strip     . furosemide (LASIX) 80 MG tablet Take 80 mg by mouth 3 (three) times daily.    . insulin aspart (NOVOLOG) 100 UNIT/ML FlexPen Inject into the skin 3 (three) times daily with meals. Sliding scale    . iron polysaccharides (NIFEREX) 150 MG capsule Take 150 mg by mouth 2 (two) times daily.    Marland Kitchen levothyroxine (SYNTHROID, LEVOTHROID) 125 MCG tablet Take 125 mcg by mouth daily before breakfast.    . magnesium oxide (MAG-OX) 400 MG tablet Take 400 mg by mouth every morning.    . metoprolol succinate (TOPROL-XL) 25 MG 24 hr tablet Take 25 mg by mouth 2 (two) times daily.     Marland Kitchen NITROSTAT 0.4 MG SL tablet     . NOVOFINE AUTOCOVER 30G X 8 MM MISC     . polyethylene glycol (MIRALAX / GLYCOLAX) packet Take 17 g by mouth daily.    . potassium chloride SA (K-DUR,KLOR-CON) 20 MEQ tablet Take 20 mEq by mouth 2 (two) times daily.     . pravastatin (PRAVACHOL) 40 MG tablet Take 40 mg by mouth every evening.     . Probiotic Product (PROBIOTIC DAILY PO) Take 1 tablet by mouth every evening.    . traMADol (ULTRAM) 50 MG tablet Take 50 mg by mouth every 4 (four) hours as needed for moderate pain.     Marland Kitchen  VITAMIN D, CHOLECALCIFEROL, PO Take 500 mg by mouth daily.    Marland Kitchen warfarin (COUMADIN) 4 MG tablet Take 4 mg by mouth as directed.    . warfarin (COUMADIN) 5 MG tablet Take 5 mg by mouth as directed. TUESDAY, Thursday, SAT, SUN     No current facility-administered medications for this visit.    Past Medical History  Diagnosis Date  . Anemia 2008  . DM (diabetes mellitus) 1999  . Cataract     bilaterla   . Permanent atrial fibrillation   . CAD (coronary artery disease)   . CRI (chronic renal insufficiency) 2008  . Hypertension   . Hyperlipemia   . GERD (gastroesophageal reflux disease)   . Arthritis   . Acute respiratory failure with hypoxia 06/12/2013    secondary to acute on chronic diastolic failure.  . Wrist  fracture, left 06/13/2013  . Acute on chronic congestive heart failure with left ventricular diastolic dysfunction 45/40/9811  . Hx of CABG 10/24/2007    Dr. Roxan Hockey  . S/P CABG x 4   . Venous insufficiency   . Pacemaker 10/24/2007    Medtronic EnRhythm; implanted for SSS, symptomatic bradycardia, PAF  . Fracture November 2014    Left wrist  . Obesity     Past Surgical History  Procedure Laterality Date  . Esophagogastroduodenoscopy  11/07/2007    [id number] Schatzki's ring.  Otherwise normal esophagus   . Appendectomy      APPENDICITIS  . Coronary artery bypass graft  10/24/2007    LIMA to LAD, SVG to DX1, SVG to OM, SVG to distal RCA (Dr. Roxan Hockey)  . Pacemaker insertion  10/24/2007    MDT EnRhythm implanted by Dr Rollene Fare  . Total knee arthroplasty Right     2012  . Coronary angioplasty  11/16/2003    PCI to prox mid RCA (2.75x40mm chromium cobalt vision Guidant stent) (Dr. Marella Chimes)  . Back surgery    . Tonsillectomy      age 23  . Insert / replace / remove pacemaker    . Joint replacement Right   . Colonoscopy with esophagogastroduodenoscopy (egd) N/A 11/25/2012    SLF:Two SMALL AC polyps/Small internal hemorrhoids/The colon IS SLIGHTLY redundant-EGD:Schatzki ring at the gastroesophageal junction/Small hiatal hernia/MILD Non-erosive gastritis  . Transthoracic echocardiogram  08/2012    EF 55-65%, mod conc hypertrophy; mild AV regurg; calcified MV annulus with mod MR; LA mod diated; RV systolic pressure increased; RA mildly dilated; PA peak pressure 56mmHg  . Nm myocar perf wall motion  2011    dipyridamole myoview - moderate ischemai in mid anteroseptal basal inferior inferioer, mid lateral and basal inferolateral region; abnormal study, EF 41%; high risk scan  . Cardiac catheterization  08/08/2007    3 vessel CAD (LAD, mid Cfx, mid RCA) (Dr. Marella Chimes)  . Eye surgery Left Aug. 2015    History   Social History  . Marital Status: Widowed    Spouse Name: N/A  .  Number of Children: 4  . Years of Education: N/A   Occupational History  .     Social History Main Topics  . Smoking status: Former Smoker -- 1.00 packs/day for 4 years    Types: Cigarettes    Quit date: 10/20/1999  . Smokeless tobacco: Never Used  . Alcohol Use: No  . Drug Use: No  . Sexual Activity: No   Other Topics Concern  . Not on file   Social History Narrative   Nursing home resident  Filed Vitals:   10/28/14 0930  BP: 142/72  Pulse: 68  Height: 5\' 4"  (1.626 m)  Weight: 245 lb (111.131 kg)  SpO2: 97%    PHYSICAL EXAM General: NAD  Neck: No JVD, no thyromegaly.  Lungs: Dry crackles at bases bilaterally with normal respiratory effort (chronic) and faint end-expiratory wheezes.  CV: Nondisplaced PMI. Irregular rhythm, normal rate, normal S1/S2, no S3, no murmur. Legs are bandaged and swollen.  Abdomen: Soft, nontender, obese.  Neurologic: Alert and oriented.  Psych: Normal affect. Skin: Normal. Musculoskeletal: No gross deformities.   ECG: Most recent ECG reviewed.      ASSESSMENT AND PLAN: 1. CAD/CABG: She remains symptomatically stable. Continue aspirin, beta blocker, and pravastatin. Nuclear stress test results noted above. While there is a mild to moderate amount of ischemia, revascularization would not change perioperative outcomes as per the CARP trial. I would not rule out the possibility of coronary angiography after knee surgery and consequent physical rehabilitation, but will aim to optimize medical therapy.   2. Permanent atrial fibrillation: Rate is controlled and on warfarin, and INR managed by pharmacy. Her heart rate is controlled on Toprol-XL 25 mg daily.   3. Pacemaker for SSS: Normal device function on 04/17/2014. Patient is in atrial fibrillation 100% of the time with no high ventricular rates noted.  4. Chronic diastolic heart failure: No changes to therapy. On high dose diuretics. O2 sats are normal.  5. Essential HTN:  Reasonably controlled for age on present therapy. No changes.  6. Peripheral vascular disease: Follows with Dr. Bridgett Larsson.   7. Preoperative risk stratification: She has multiple comorbidities which would give her at least an intermediate risk for a major adverse cardiac event, but not prohibitive. She had normal LV systolic function when last assessed by echocardiogram, but LVEF is moderately reduced by nuclear MPI study. Nuclear stress test results noted above. While there is a mild to moderate amount of ischemia, revascularization would not change perioperative outcomes as per the CARP trial. I would not rule out the possibility of coronary angiography after knee surgery and consequent physical rehabilitation, but will aim to optimize medical therapy.  I will obtain an echocardiogram to assess whether or not LV function has indeed declined or is lower as a result of gating abnormalities. Continue metoprolol succinate to help attenuate perioperative risk.  Dispo: f/u 3 months.   Kate Sable, M.D., F.A.C.C.

## 2014-10-28 NOTE — Patient Instructions (Signed)
Your physician recommends that you schedule a follow-up appointment in: 3 months. Your physician recommends that you continue on your current medications as directed. Please refer to the Current Medication list given to you today. Your physician has requested that you have an echocardiogram. Echocardiography is a painless test that uses sound waves to create images of your heart. It provides your doctor with information about the size and shape of your heart and how well your heart's chambers and valves are working. This procedure takes approximately one hour. There are no restrictions for this procedure.  

## 2014-10-30 ENCOUNTER — Encounter: Payer: Self-pay | Admitting: Internal Medicine

## 2014-11-17 ENCOUNTER — Telehealth: Payer: Self-pay | Admitting: *Deleted

## 2014-11-17 NOTE — Telephone Encounter (Addendum)
-----   Message from Herminio Commons, MD sent at 11/13/2014  9:55 AM EDT ----- Regarding: RE: echo results  Thanks for letting me know about echo. Can't figure out why it's not listed under CV procedures since it's a [facility] echo (?). Normal pumping function as suspected, and low EF on nuclear result of gating abnormalities. Proceed with surgery as planned with an intermediate risk.  ----- Message -----    From: Laurine Blazer, LPN    Sent: 10/24/7122   8:20 AM      To: Herminio Commons, MD Subject: echo results                                   Not sure that you saw these results.  No disposition or addendum to office note found.    Echo done on 10/28/2014.  ThxEdd Fabian

## 2014-11-19 NOTE — Telephone Encounter (Signed)
[name] ([facility] nursing [facility]) notified of results.    Will fax this note at [facility] request.

## 2015-01-18 ENCOUNTER — Ambulatory Visit (INDEPENDENT_AMBULATORY_CARE_PROVIDER_SITE_OTHER): Payer: Medicare HMO | Admitting: Cardiovascular Disease

## 2015-01-18 ENCOUNTER — Encounter: Payer: Self-pay | Admitting: Cardiovascular Disease

## 2015-01-18 VITALS — BP 137/76 | HR 64 | Ht 64.0 in | Wt 239.8 lb

## 2015-01-18 DIAGNOSIS — I739 Peripheral vascular disease, unspecified: Secondary | ICD-10-CM

## 2015-01-18 DIAGNOSIS — I10 Essential (primary) hypertension: Secondary | ICD-10-CM

## 2015-01-18 DIAGNOSIS — R931 Abnormal findings on diagnostic imaging of heart and coronary circulation: Secondary | ICD-10-CM

## 2015-01-18 DIAGNOSIS — I482 Chronic atrial fibrillation, unspecified: Secondary | ICD-10-CM

## 2015-01-18 DIAGNOSIS — I25812 Atherosclerosis of bypass graft of coronary artery of transplanted heart without angina pectoris: Secondary | ICD-10-CM

## 2015-01-18 DIAGNOSIS — IMO0001 Reserved for inherently not codable concepts without codable children: Secondary | ICD-10-CM

## 2015-01-18 DIAGNOSIS — I5032 Chronic diastolic (congestive) heart failure: Secondary | ICD-10-CM

## 2015-01-18 DIAGNOSIS — I83021 Varicose veins of left lower extremity with ulcer of thigh: Secondary | ICD-10-CM

## 2015-01-18 DIAGNOSIS — I83022 Varicose veins of left lower extremity with ulcer of calf: Secondary | ICD-10-CM

## 2015-01-18 DIAGNOSIS — I83025 Varicose veins of left lower extremity with ulcer other part of foot: Secondary | ICD-10-CM

## 2015-01-18 DIAGNOSIS — I83028 Varicose veins of left lower extremity with ulcer other part of lower leg: Secondary | ICD-10-CM

## 2015-01-18 DIAGNOSIS — I83023 Varicose veins of left lower extremity with ulcer of ankle: Secondary | ICD-10-CM

## 2015-01-18 DIAGNOSIS — I83024 Varicose veins of left lower extremity with ulcer of heel and midfoot: Secondary | ICD-10-CM

## 2015-01-18 DIAGNOSIS — Z950 Presence of cardiac pacemaker: Secondary | ICD-10-CM

## 2015-01-18 DIAGNOSIS — I83029 Varicose veins of left lower extremity with ulcer of unspecified site: Secondary | ICD-10-CM

## 2015-01-18 NOTE — Patient Instructions (Signed)
Continue all current medications. Your physician wants you to follow up in: 6 months.  You will receive a reminder letter in the mail one-two months in advance.  If you don't receive a letter, please call our office to schedule the follow up appointment   

## 2015-01-18 NOTE — Progress Notes (Signed)
Patient ID: [name], female   DOB: 02-12-1933, 81 y.o.   MRN: [id number]      SUBJECTIVE: The patient returns for routine follow-up. Myocardial perfusion images on 10/19/14 demonstrated a mild to moderate amount of anteroapical and anterolateral wall ischemia with anterolateral wall hypokinesis. Left ventricular systolic function was moderately reduced, calculated LVEF 37%.  Echocardiogram on 10/28/14 demonstrated normal left ventricular systolic function, EF 02-58%, moderate concentric LVH, high ventricular filling pressures, ischemic wall motion abnormalities, mild to moderate mitral regurgitation, and mild tricuspid, aortic, and pulmonic regurgitation.  Wt 239 lbs (245 lbs on 10/28/14).  She has been doing well. She has cut back on her sweets in order to lose weight. She denies chest pain and occasionally has a palpitation. She is being treated for a chronic left lower extremity ulcer.    Review of Systems: As per "subjective", otherwise negative.  Allergies  Allergen Reactions  . Penicillins Other (See Comments)    Pt. Not sure of reaction.    Current Outpatient Prescriptions  Medication Sig Dispense Refill  . ALPRAZolam (XANAX) 0.25 MG tablet Take 0.25 mg by mouth 3 (three) times daily as needed for anxiety.    Marland Kitchen aspirin 81 MG tablet Take 81 mg by mouth daily.     . calcium-vitamin D (OSCAL WITH D) 500-200 MG-UNIT per tablet Take 1 tablet by mouth 2 (two) times daily.    . cholecalciferol (VITAMIN D) 1000 UNITS tablet Take 3,000 Units by mouth daily.    . citalopram (CELEXA) 20 MG tablet Take 20 mg by mouth daily.    . clindamycin (CLEOCIN) 300 MG capsule Take 300 mg by mouth 3 (three) times daily.    Marland Kitchen docusate sodium (COLACE) 100 MG capsule Take 100 mg by mouth 2 (two) times daily.    . fentaNYL (DURAGESIC - DOSED MCG/HR) 25 MCG/HR patch Place 25 mcg onto the skin every 3 (three) days.    . folic acid (FOLVITE) 1 MG tablet Take 1 mg by mouth daily.     Marland Kitchen FORA V12 BLOOD  GLUCOSE TEST test strip     . furosemide (LASIX) 80 MG tablet Take 80 mg by mouth 3 (three) times daily.    . insulin aspart (NOVOLOG) 100 UNIT/ML FlexPen Inject into the skin 3 (three) times daily with meals. Sliding scale    . iron polysaccharides (NIFEREX) 150 MG capsule Take 150 mg by mouth daily.     Marland Kitchen levothyroxine (SYNTHROID, LEVOTHROID) 125 MCG tablet Take 125 mcg by mouth daily before breakfast.    . magnesium oxide (MAG-OX) 400 MG tablet Take 400 mg by mouth every morning.    . metoprolol succinate (TOPROL-XL) 25 MG 24 hr tablet Take 25 mg by mouth 2 (two) times daily.     Marland Kitchen NITROSTAT 0.4 MG SL tablet     . NOVOFINE AUTOCOVER 30G X 8 MM MISC     . polyethylene glycol (MIRALAX / GLYCOLAX) packet Take 17 g by mouth daily.    . potassium chloride SA (K-DUR,KLOR-CON) 20 MEQ tablet Take 20 mEq by mouth daily.     . pravastatin (PRAVACHOL) 40 MG tablet Take 40 mg by mouth every evening.     . Probiotic Product (PROBIOTIC DAILY PO) Take 1 tablet by mouth every evening.    . sulfamethoxazole-trimethoprim (BACTRIM DS,SEPTRA DS) 800-160 MG per tablet Take 1 tablet by mouth 2 (two) times daily.    . traMADol (ULTRAM) 50 MG tablet Take 50 mg by mouth every 4 (four) hours  as needed for moderate pain.     Marland Kitchen triamcinolone cream (KENALOG) 0.1 % Apply 1 application topically daily.    Marland Kitchen warfarin (COUMADIN) 4 MG tablet Take 4 mg by mouth as directed.    . warfarin (COUMADIN) 5 MG tablet Take 5 mg by mouth as directed. TUESDAY, Thursday, SAT, SUN     No current facility-administered medications for this visit.    Past Medical History  Diagnosis Date  . Anemia 2008  . DM (diabetes mellitus) 1999  . Cataract     bilaterla   . Permanent atrial fibrillation   . CAD (coronary artery disease)   . CRI (chronic renal insufficiency) 2008  . Hypertension   . Hyperlipemia   . GERD (gastroesophageal reflux disease)   . Arthritis   . Acute respiratory failure with hypoxia 06/12/2013    secondary to  acute on chronic diastolic failure.  . Wrist fracture, left 06/13/2013  . Acute on chronic congestive heart failure with left ventricular diastolic dysfunction 52/84/1324  . Hx of CABG 10/24/2007    Dr. Roxan Hockey  . S/P CABG x 4   . Venous insufficiency   . Pacemaker 10/24/2007    Medtronic EnRhythm; implanted for SSS, symptomatic bradycardia, PAF  . Fracture November 2014    Left wrist  . Obesity     Past Surgical History  Procedure Laterality Date  . Esophagogastroduodenoscopy  11/07/2007    [id number] Schatzki's ring.  Otherwise normal esophagus   . Appendectomy      APPENDICITIS  . Coronary artery bypass graft  10/24/2007    LIMA to LAD, SVG to DX1, SVG to OM, SVG to distal RCA (Dr. Roxan Hockey)  . Pacemaker insertion  10/24/2007    MDT EnRhythm implanted by Dr Rollene Fare  . Total knee arthroplasty Right     2012  . Coronary angioplasty  11/16/2003    PCI to prox mid RCA (2.75x67mm chromium cobalt vision Guidant stent) (Dr. Marella Chimes)  . Back surgery    . Tonsillectomy      age 30  . Insert / replace / remove pacemaker    . Joint replacement Right   . Colonoscopy with esophagogastroduodenoscopy (egd) N/A 11/25/2012    SLF:Two SMALL AC polyps/Small internal hemorrhoids/The colon IS SLIGHTLY redundant-EGD:Schatzki ring at the gastroesophageal junction/Small hiatal hernia/MILD Non-erosive gastritis  . Transthoracic echocardiogram  08/2012    EF 55-65%, mod conc hypertrophy; mild AV regurg; calcified MV annulus with mod MR; LA mod diated; RV systolic pressure increased; RA mildly dilated; PA peak pressure 69mmHg  . Nm myocar perf wall motion  2011    dipyridamole myoview - moderate ischemai in mid anteroseptal basal inferior inferioer, mid lateral and basal inferolateral region; abnormal study, EF 41%; high risk scan  . Cardiac catheterization  08/08/2007    3 vessel CAD (LAD, mid Cfx, mid RCA) (Dr. Marella Chimes)  . Eye surgery Left Aug. 2015    History   Social History  .  Marital Status: Widowed    Spouse Name: N/A  . Number of Children: 4  . Years of Education: N/A   Occupational History  .     Social History Main Topics  . Smoking status: Former Smoker -- 1.00 packs/day for 4 years    Types: Cigarettes    Quit date: 10/20/1999  . Smokeless tobacco: Never Used  . Alcohol Use: No  . Drug Use: No  . Sexual Activity: No   Other Topics Concern  . Not on file   Social  History Narrative   Nursing home resident     Filed Vitals:   01/18/15 0924  BP: 137/76  Pulse: 64  Height: 5\' 4"  (1.626 m)  Weight: 239 lb 12.8 oz (108.773 kg)  SpO2: 92%    PHYSICAL EXAM General: NAD  Neck: No JVD, no thyromegaly.  Lungs: Dry crackles at bases bilaterally with normal respiratory effort (chronic) and faint end-expiratory wheezes.  CV: Nondisplaced PMI. Irregular rhythm, normal rate, normal S1/S2, no S3, no murmur. Left leg bandaged and swollen. Right leg 1+ pitting pretibial edema. Abdomen: Soft, nontender, obese.  Neurologic: Alert and oriented.  Psych: Normal affect. Skin: Normal. Musculoskeletal: No gross deformities.   ECG: Most recent ECG reviewed.      ASSESSMENT AND PLAN: 1. CAD/CABG: She remains symptomatically stable. Normal LV systolic function by echocardiogram. Continue aspirin, beta blocker, and pravastatin.  2. Permanent atrial fibrillation: Rate is controlled on Toprol-Xl 25 mg twice daily. Maintained on warfarin, with INR managed by pharmacy.   3. Pacemaker for SSS: Normal device function on 10/16/2014. Patient is in atrial fibrillation 100% of the time with no high ventricular rates noted.  4. Chronic diastolic heart failure: No changes to therapy. On high dose diuretics.  5. Essential HTN: Well controlled on present therapy. No changes.  6. Peripheral vascular disease: Follows with Dr. Bridgett Larsson.   Dispo: f/u 6 months.  Kate Sable, M.D., F.A.C.C.

## 2015-03-02 ENCOUNTER — Other Ambulatory Visit: Payer: Self-pay | Admitting: Surgical

## 2015-03-02 MED ORDER — BUPIVACAINE LIPOSOME 1.3 % IJ SUSP: 20.0000 mL | Freq: Once | INTRAMUSCULAR | Status: AC

## 2015-03-12 ENCOUNTER — Encounter (HOSPITAL_COMMUNITY): Payer: Self-pay

## 2015-03-12 ENCOUNTER — Encounter (HOSPITAL_COMMUNITY)
Admission: RE | Admit: 2015-03-12 | Discharge: 2015-03-12 | Disposition: A | Discharge status: Home / Self Care | Payer: Medicare HMO | Source: Ambulatory Visit | Attending: Orthopedic Surgery | Admitting: Orthopedic Surgery

## 2015-03-12 ENCOUNTER — Ambulatory Visit (HOSPITAL_COMMUNITY)
Admission: RE | Admit: 2015-03-12 | Discharge: 2015-03-12 | Disposition: A | Discharge status: Home / Self Care | Payer: Medicare HMO | Source: Ambulatory Visit | Attending: Surgical | Admitting: Surgical

## 2015-03-12 DIAGNOSIS — Z01818 Encounter for other preprocedural examination: Secondary | ICD-10-CM | POA: Insufficient documentation

## 2015-03-12 DIAGNOSIS — J811 Chronic pulmonary edema: Secondary | ICD-10-CM | POA: Insufficient documentation

## 2015-03-12 DIAGNOSIS — I517 Cardiomegaly: Secondary | ICD-10-CM | POA: Diagnosis not present

## 2015-03-12 HISTORY — DX: Personal history of other medical treatment: Z92.89

## 2015-03-12 LAB — CBC WITH DIFFERENTIAL/PLATELET
Basophils Absolute: 0 10*3/uL (ref 0.0–0.1)
Basophils Relative: 0 % (ref 0–1)
Eosinophils Absolute: 0.3 10*3/uL (ref 0.0–0.7)
Eosinophils Relative: 4 % (ref 0–5)
HCT: 41.2 % (ref 36.0–46.0)
Hemoglobin: 12.9 g/dL (ref 12.0–15.0)
Lymphocytes Relative: 22 % (ref 12–46)
Lymphs Abs: 1.8 10*3/uL (ref 0.7–4.0)
MCH: 26.9 pg (ref 26.0–34.0)
MCHC: 31.3 g/dL (ref 30.0–36.0)
MCV: 86 fL (ref 78.0–100.0)
Monocytes Absolute: 0.5 10*3/uL (ref 0.1–1.0)
Monocytes Relative: 6 % (ref 3–12)
Neutro Abs: 5.6 10*3/uL (ref 1.7–7.7)
Neutrophils Relative %: 68 % (ref 43–77)
Platelets: 225 10*3/uL (ref 150–400)
RBC: 4.79 MIL/uL (ref 3.87–5.11)
RDW: 15.6 % — ABNORMAL HIGH (ref 11.5–15.5)
WBC: 8.3 10*3/uL (ref 4.0–10.5)

## 2015-03-12 LAB — COMPREHENSIVE METABOLIC PANEL
ALT: 15 U/L (ref 14–54)
AST: 23 U/L (ref 15–41)
Albumin: 3.6 g/dL (ref 3.5–5.0)
Alkaline Phosphatase: 83 U/L (ref 38–126)
Anion gap: 8 (ref 5–15)
BUN: 45 mg/dL — ABNORMAL HIGH (ref 6–20)
CO2: 31 mmol/L (ref 22–32)
Calcium: 9.3 mg/dL (ref 8.9–10.3)
Chloride: 103 mmol/L (ref 101–111)
Creatinine, Ser: 1.36 mg/dL — ABNORMAL HIGH (ref 0.44–1.00)
GFR calc Af Amer: 41 mL/min — ABNORMAL LOW (ref >60)
GFR calc non Af Amer: 36 mL/min — ABNORMAL LOW (ref >60)
Glucose, Bld: 137 mg/dL — ABNORMAL HIGH (ref 65–99)
Potassium: 4.1 mmol/L (ref 3.5–5.1)
Sodium: 142 mmol/L (ref 135–145)
Total Bilirubin: 0.7 mg/dL (ref 0.3–1.2)
Total Protein: 7.6 g/dL (ref 6.5–8.1)

## 2015-03-12 LAB — SURGICAL PCR SCREEN
MRSA, PCR: NEGATIVE
STAPHYLOCOCCUS AUREUS: NEGATIVE

## 2015-03-12 LAB — URINALYSIS, ROUTINE W REFLEX MICROSCOPIC
Bilirubin Urine: NEGATIVE
Glucose, UA: NEGATIVE mg/dL
Ketones, ur: NEGATIVE mg/dL
Nitrite: NEGATIVE
Protein, ur: NEGATIVE mg/dL
Specific Gravity, Urine: 1.014 (ref 1.005–1.030)
Urobilinogen, UA: 0.2 mg/dL (ref 0.0–1.0)
pH: 6 (ref 5.0–8.0)

## 2015-03-12 LAB — APTT: aPTT: 33 seconds (ref 24–37)

## 2015-03-12 LAB — PROTIME-INR
INR: 1.78 — ABNORMAL HIGH (ref 0.00–1.49)
Prothrombin Time: 20.6 seconds — ABNORMAL HIGH (ref 11.6–15.2)

## 2015-03-12 LAB — URINE MICROSCOPIC-ADD ON

## 2015-03-12 LAB — ABO/RH: ABO/RH(D): O NEG

## 2015-03-12 NOTE — Progress Notes (Signed)
03-12-15 1545 Labs viewable in [facility], note CMP, PT/PTT(Coumadin use), will repeat PT/INR AM of per anesthesia guidelines.Note to Dr. Charlestine Night office [phone] per Epic.

## 2015-03-12 NOTE — Pre-Procedure Instructions (Addendum)
03-12-15 EKG 10-05-14 Epic. Stress 10-19-14 Epic. CXR 10'15 [facility]), CXR done today. 03-12-15 1615 Note faxed to Dr. Charlestine Night office to note today's CXR. 03-15-15 1100 AM note fax per Epic to note urinalysis result.

## 2015-03-12 NOTE — Patient Instructions (Addendum)
20 [name]  03/12/2015   Your procedure is scheduled on:   03-19-2015 Friday  Enter through [place] and follow signs to [facility]. Arrive at  0930     AM .  (Limit 1 person with you).  Call this number if you have problems the morning of surgery: [phone]  Or Presurgical Testing [phone].   For Living Will and/or Health Care Power Attorney Forms: please provide copy for your medical record,may bring AM of surgery(Forms should be already notarized -we do not provide this service).( No, information provided  today per request).      Do not eat food/ or drink: After Midnight.    Take these medicines the morning of surgery with A SIP OF WATER: Levothyroxine.Metoprolol.Celexa. Duragesic transdermal Patch(leave in place), Levemir (1/2 usual PM dose) night before. No Diabetic meds or insulin AM of. Coumadin use per MD instructions.   Do not wear jewelry, make-up or nail polish.  Do not wear deodorant, lotions, powders, or perfumes.   Do not shave legs and under arms- 48 hours(2 days) prior to first CHG shower.(Shaving face and neck okay.)  Do not bring valuables to the hospital.(Hospital is not responsible for lost valuables).  Contacts, dentures or removable bridgework, body piercing, hair pins may not be worn into surgery.  Leave suitcase in the car. After surgery it may be brought to your room.  For patients admitted to the hospital, checkout time is 11:00 AM the day of discharge.(Restricted visitors-Any Persons displaying flu-like symptoms or illness).    Patients discharged the day of surgery will not be allowed to drive home. Must have responsible person with you x 24 hours once discharged.  Name and phone number of your driver: Son- [name] [phone]     Please read over the following fact sheets that you were given:  CHG(Chlorhexidine Gluconate 4% Surgical Soap) use, MRSA Information, Blood Transfusion fact sheet, Incentive Spirometry  Instruction.  Remember : Type/Screen "Blue armbands" - may not be removed once applied(would result in being retested AM of surgery, if removed).         [name] - Preparing for Surgery Before surgery, you can play an important role.  Because skin is not sterile, your skin needs to be as free of germs as possible.  You can reduce the number of germs on your skin by washing with CHG (chlorahexidine gluconate) soap before surgery.  CHG is an antiseptic cleaner which kills germs and bonds with the skin to continue killing germs even after washing. Please DO NOT use if you have an allergy to CHG or antibacterial soaps.  If your skin becomes reddened/irritated stop using the CHG and inform your nurse when you arrive at Short Stay. Do not shave (including legs and underarms) for at least 48 hours prior to the first CHG shower.  You may shave your face/neck. Please follow these instructions carefully:  1.  Shower with CHG Soap the night before surgery and the  morning of Surgery.  2.  If you choose to wash your hair, wash your hair first as usual with your  normal  shampoo.  3.  After you shampoo, rinse your hair and body thoroughly to remove the  shampoo.                           4.  Use CHG as you would any other liquid soap.  You can apply chg directly  to the skin and wash                       Gently with a scrungie or clean washcloth.  5.  Apply the CHG Soap to your body ONLY FROM THE NECK DOWN.   Do not use on face/ open                           Wound or open sores. Avoid contact with eyes, ears mouth and genitals (private parts).                       Wash face,  Genitals (private parts) with your normal soap.             6.  Wash thoroughly, paying special attention to the area where your surgery  will be performed.  7.  Thoroughly rinse your body with warm water from the neck down.  8.  DO NOT shower/wash with your normal soap after using and rinsing off  the CHG Soap.                 9.  Pat yourself dry with a clean towel.            10.  Wear clean pajamas.            11.  Place clean sheets on your bed the night of your first shower and do not  sleep with pets. Day of Surgery : Do not apply any lotions/deodorants the morning of surgery.  Please wear clean clothes to the hospital/surgery center.  FAILURE TO FOLLOW THESE INSTRUCTIONS MAY RESULT IN THE CANCELLATION OF YOUR SURGERY PATIENT SIGNATURE_________________________________  NURSE SIGNATURE__________________________________  ________________________________________________________________________   [name]  An incentive spirometer is a tool that can help keep your lungs clear and active. This tool measures how well you are filling your lungs with each breath. Taking long deep breaths may help reverse or decrease the chance of developing breathing (pulmonary) problems (especially infection) following:  A long period of time when you are unable to move or be active. BEFORE THE PROCEDURE   If the spirometer includes an indicator to show your best effort, your nurse or respiratory therapist will set it to a desired goal.  If possible, sit up straight or lean slightly forward. Try not to slouch.  Hold the incentive spirometer in an upright position. INSTRUCTIONS FOR USE   Sit on the edge of your bed if possible, or sit up as far as you can in bed or on a chair.  Hold the incentive spirometer in an upright position.  Breathe out normally.  Place the mouthpiece in your mouth and seal your lips tightly around it.  Breathe in slowly and as deeply as possible, raising the piston or the ball toward the top of the column.  Hold your breath for 3-5 seconds or for as long as possible. Allow the piston or ball to fall to the bottom of the column.  Remove the mouthpiece from your mouth and breathe out normally.  Rest for a few seconds and repeat Steps 1 through 7 at least 10 times every 1-2 hours  when you are awake. Take your time and take a few normal breaths between deep breaths.  The spirometer may include an indicator to show your best effort. Use the indicator as a goal to work toward during each repetition.  After  each set of 10 deep breaths, practice coughing to be sure your lungs are clear. If you have an incision (the cut made at the time of surgery), support your incision when coughing by placing a pillow or rolled up towels firmly against it. Once you are able to get out of bed, walk around indoors and cough well. You may stop using the incentive spirometer when instructed by your caregiver.  RISKS AND COMPLICATIONS  Take your time so you do not get dizzy or light-headed.  If you are in pain, you may need to take or ask for pain medication before doing incentive spirometry. It is harder to take a deep breath if you are having pain. AFTER USE  Rest and breathe slowly and easily.  It can be helpful to keep track of a log of your progress. Your caregiver can provide you with a simple table to help with this. If you are using the spirometer at home, follow these instructions: [place] IF:   You are having difficultly using the spirometer.  You have trouble using the spirometer as often as instructed.  Your pain medication is not giving enough relief while using the spirometer.  You develop fever of 100.5 F (38.1 C) or higher. SEEK IMMEDIATE MEDICAL CARE IF:   You cough up bloody sputum that had not been present before.  You develop fever of 102 F (38.9 C) or greater.  You develop worsening pain at or near the incision site. MAKE SURE YOU:   Understand these instructions.  Will watch your condition.  Will get help right away if you are not doing well or get worse. Document Released: 11/27/2006 Document Revised: 10/09/2011 Document Reviewed: 01/28/2007 ExitCare Patient Information 2014 ExitCare,  [place].   ________________________________________________________________________  WHAT IS A BLOOD TRANSFUSION? Blood Transfusion Information  A transfusion is the replacement of blood or some of its parts. Blood is made up of multiple cells which provide different functions.  Red blood cells carry oxygen and are used for blood loss replacement.  White blood cells fight against infection.  Platelets control bleeding.  Plasma helps clot blood.  Other blood products are available for specialized needs, such as hemophilia or other clotting disorders. BEFORE THE TRANSFUSION  Who gives blood for transfusions?   Healthy volunteers who are fully evaluated to make sure their blood is safe. This is blood bank blood. Transfusion therapy is the safest it has ever been in the practice of medicine. Before blood is taken from a donor, a complete history is taken to make sure that person has no history of diseases nor engages in risky social behavior (examples are intravenous drug use or sexual activity with multiple partners). The donor's travel history is screened to minimize risk of transmitting infections, such as malaria. The donated blood is tested for signs of infectious diseases, such as HIV and hepatitis. The blood is then tested to be sure it is compatible with you in order to minimize the chance of a transfusion reaction. If you or a relative donates blood, this is often done in anticipation of surgery and is not appropriate for emergency situations. It takes many days to process the donated blood. RISKS AND COMPLICATIONS Although transfusion therapy is very safe and saves many lives, the main dangers of transfusion include:   Getting an infectious disease.  Developing a transfusion reaction. This is an allergic reaction to something in the blood you were given. Every precaution is taken to prevent this. The decision to have  a blood transfusion has been considered carefully by your caregiver  before blood is given. Blood is not given unless the benefits outweigh the risks. AFTER THE TRANSFUSION  Right after receiving a blood transfusion, you will usually feel much better and more energetic. This is especially true if your red blood cells have gotten low (anemic). The transfusion raises the level of the red blood cells which carry oxygen, and this usually causes an energy increase.  The nurse administering the transfusion will monitor you carefully for complications. HOME CARE INSTRUCTIONS  No special instructions are needed after a transfusion. You may find your energy is better. Speak with your caregiver about any limitations on activity for underlying diseases you may have. SEEK MEDICAL CARE IF:   Your condition is not improving after your transfusion.  You develop redness or irritation at the intravenous (IV) site. SEEK IMMEDIATE MEDICAL CARE IF:  Any of the following symptoms occur over the next 12 hours:  Shaking chills.  You have a temperature by mouth above 102 F (38.9 C), not controlled by medicine.  Chest, back, or muscle pain.  People around you feel you are not acting correctly or are confused.  Shortness of breath or difficulty breathing.  Dizziness and fainting.  You get a rash or develop hives.  You have a decrease in urine output.  Your urine turns a dark color or changes to pink, red, or brown. Any of the following symptoms occur over the next 10 days:  You have a temperature by mouth above 102 F (38.9 C), not controlled by medicine.  Shortness of breath.  Weakness after normal activity.  The white part of the eye turns yellow (jaundice).  You have a decrease in the amount of urine or are urinating less often.  Your urine turns a dark color or changes to pink, red, or brown. Document Released: 07/14/2000 Document Revised: 10/09/2011 Document Reviewed: 03/02/2008 [facility] Patient Information 2014 [place],  [place].  _______________________________________________________________________

## 2015-03-12 NOTE — Progress Notes (Signed)
03-12-15 1615 CXR results noted of today, and faxed to Dr. Charlestine Night  office [phone].

## 2015-03-15 NOTE — Progress Notes (Signed)
03-15-15 1100 lABS [place]. NOTE PER ePIC TO [phone]. Note urinalysis

## 2015-03-16 NOTE — H&P (Signed)
TOTAL KNEE ADMISSION H&P  Patient is being admitted for left total knee arthroplasty.  Subjective:  Chief Complaint:left knee pain.  HPI: [name], 81 y.o. female, has a history of pain and functional disability in the left knee due to arthritis and has failed non-surgical conservative treatments for greater than 12 weeks to includeNSAID's and/or analgesics, corticosteriod injections, flexibility and strengthening excercises, use of assistive devices and activity modification.  Onset of symptoms was gradual, starting 5 years ago with gradually worsening course since that time. The patient noted no past surgery on the left knee(s).  Patient currently rates pain in the left knee(s) at 8 out of 10 with activity. Patient has night pain, worsening of pain with activity and weight bearing, pain that interferes with activities of daily living, pain with passive range of motion, crepitus and joint swelling.  Patient has evidence of periarticular osteophytes, joint subluxation and joint space narrowing by imaging studies.  There is no active infection.  Patient Active Problem List   Diagnosis Date Noted  . Aftercare following surgery of the circulatory system, [place] 04/24/2014  . Symptomatic bradycardia 04/17/2014  . Peripheral vascular disease, unspecified 01/23/2014  . Venous stasis ulcer 10/17/2013  . Atherosclerosis of native arteries of the extremities with ulceration(440.23) 08/29/2013  . Colles' fracture of left radius 08/19/2013  . Wrist fracture, left 06/13/2013  . Fall 06/13/2013  . Acute respiratory failure with hypoxia 06/12/2013  . Visual hallucinations 06/12/2013  . DM (diabetes mellitus), type 2 06/11/2013  . Acute on chronic diastolic heart failure 67/59/1638  . Morbid obesity 06/11/2013  . ARF (acute renal failure) 06/07/2013  . Cellulitis and abscess of leg 06/05/2013  . CHF exacerbation 11/19/2012  . Chronic lower GI bleeding 11/19/2012  . Type II or unspecified type  diabetes mellitus without mention of complication, not stated as uncontrolled 11/19/2012  . Chronic kidney disease (CKD), stage III (moderate) 11/19/2012  . Atrial fibrillation 11/15/2012  . Long term (current) use of anticoagulants 11/15/2012  . Anemia    Past Medical History  Diagnosis Date  . Anemia 2008  . DM (diabetes mellitus) 1999  . Cataract     bilaterla   . Permanent atrial fibrillation   . CAD (coronary artery disease)   . CRI (chronic renal insufficiency) 2008  . Hypertension   . Hyperlipemia   . GERD (gastroesophageal reflux disease)   . Arthritis   . Acute respiratory failure with hypoxia 06/12/2013    secondary to acute on chronic diastolic failure.  . Wrist fracture, left 06/13/2013    03-12-15 no residual issues  . Acute on chronic congestive heart failure with left ventricular diastolic dysfunction 46/65/9935  . Hx of CABG 10/24/2007    Dr. Roxan Hockey  . S/P CABG x 4   . Pacemaker 10/24/2007    Medtronic EnRhythm; implanted for SSS, symptomatic bradycardia, PAF  . Fracture November 2014    Left wrist-03-12-15 no issues now  . Obesity   . Transfusion history   . Venous insufficiency     8-12-16left lower lateral outside healed leg ulcer-scabbed over.    Past Surgical History  Procedure Laterality Date  . Esophagogastroduodenoscopy  11/07/2007    TSV:[id number] Schatzki's ring.  Otherwise normal esophagus   . Appendectomy      APPENDICITIS  . Coronary artery bypass graft  10/24/2007    LIMA to LAD, SVG to DX1, SVG to OM, SVG to distal RCA (Dr. Roxan Hockey)  . Pacemaker insertion  10/24/2007    MDT EnRhythm implanted  by Dr Rollene Fare  . Total knee arthroplasty Right     2012  . Coronary angioplasty  11/16/2003    PCI to prox mid RCA (2.75x58mm chromium cobalt vision Guidant stent) (Dr. Marella Chimes)  . Back surgery    . Tonsillectomy      age 68  . Insert / replace / remove pacemaker    . Joint replacement Right   . Colonoscopy with esophagogastroduodenoscopy  (egd) N/A 11/25/2012    SLF:Two SMALL AC polyps/Small internal hemorrhoids/The colon IS SLIGHTLY redundant-EGD:Schatzki ring at the gastroesophageal junction/Small hiatal hernia/MILD Non-erosive gastritis  . Transthoracic echocardiogram  08/2012    EF 55-65%, mod conc hypertrophy; mild AV regurg; calcified MV annulus with mod MR; LA mod diated; RV systolic pressure increased; RA mildly dilated; PA peak pressure 91mmHg  . Nm myocar perf wall motion  2011    dipyridamole myoview - moderate ischemai in mid anteroseptal basal inferior inferioer, mid lateral and basal inferolateral region; abnormal study, EF 41%; high risk scan  . Cardiac catheterization  08/08/2007    3 vessel CAD (LAD, mid Cfx, mid RCA) (Dr. Marella Chimes)  . Tubal ligation    . Cataract extraction, bilateral    . Eye surgery Left Aug. 2015    torned retina repair      Current outpatient prescriptions:  .  ALPRAZolam (XANAX) 0.25 MG tablet, Take 0.25 mg by mouth 2 (two) times daily as needed for anxiety. , Disp: , Rfl:  .  calcium-vitamin D (OSCAL WITH D) 500-200 MG-UNIT per tablet, Take 1 tablet by mouth 2 (two) times daily., Disp: , Rfl:  .  citalopram (CELEXA) 20 MG tablet, Take 20 mg by mouth every morning. , Disp: , Rfl:  .  docusate sodium (COLACE) 100 MG capsule, Take 100 mg by mouth 2 (two) times daily., Disp: , Rfl:  .  fentaNYL (DURAGESIC - DOSED MCG/HR) 25 MCG/HR patch, Place 25 mcg onto the skin every 3 (three) days., Disp: , Rfl:  .  folic acid (FOLVITE) 1 MG tablet, Take 1 mg by mouth every morning. , Disp: , Rfl:  .  furosemide (LASIX) 80 MG tablet, Take 80 mg by mouth 3 (three) times daily., Disp: , Rfl:  .  insulin aspart (NOVOLOG) 100 UNIT/ML FlexPen, Inject 1-10 Units into the skin 3 (three) times daily with meals. Sliding scale 70-100=0 units 101-150=1 unit 151-200=2 units 201-250= 4 units 251-300=6 units 301-350=8 units 350< = 10 units, Disp: , Rfl:  .  insulin aspart (NOVOLOG) 100 UNIT/ML injection, Inject 5  Units into the skin 3 (three) times daily before meals., Disp: , Rfl:  .  LEVEMIR 100 UNIT/ML injection, Inject 10 Units into the skin at bedtime. , Disp: , Rfl:  .  levothyroxine (SYNTHROID, LEVOTHROID) 125 MCG tablet, Take 125 mcg by mouth daily before breakfast., Disp: , Rfl:  .  Melatonin 3 MG CAPS, Take 1 capsule by mouth at bedtime., Disp: , Rfl:  .  metoprolol succinate (TOPROL-XL) 25 MG 24 hr tablet, Take 25 mg by mouth 2 (two) times daily. , Disp: , Rfl:  .  polyethylene glycol (MIRALAX / GLYCOLAX) packet, Take 17 g by mouth daily as needed for mild constipation. , Disp: , Rfl:  .  pravastatin (PRAVACHOL) 40 MG tablet, Take 40 mg by mouth every evening. , Disp: , Rfl:  .  traMADol (ULTRAM) 50 MG tablet, Take 50 mg by mouth 3 (three) times daily as needed for moderate pain. , Disp: , Rfl:  .  vitamin C (ASCORBIC ACID) 500 MG tablet, Take 500 mg by mouth every morning., Disp: , Rfl:  .  warfarin (COUMADIN) 5 MG tablet, Take 5 mg by mouth every evening. TUESDAY, Thursday, SAT, SUN, Disp: , Rfl:     Allergies  Allergen Reactions  . Penicillins Other (See Comments)    Pt. Not sure of reaction.    Social History  Substance Use Topics  . Smoking status: Former Smoker -- 1.00 packs/day for 4 years    Types: Cigarettes    Quit date: 10/20/1999  . Smokeless tobacco: Never Used  . Alcohol Use: No    Family History  Problem Relation Age of Onset  . Colon cancer Neg Hx   . Colon polyps Neg Hx   . Hodgkin's lymphoma Mother   . Diabetes Son   . Stroke Father      Review of Systems  Constitutional: Positive for malaise/fatigue. Negative for fever, chills, weight loss and diaphoresis.  HENT: Negative.   Eyes: Negative.   Respiratory: Positive for shortness of breath. Negative for cough, hemoptysis, sputum production and wheezing.        SOB with exertion  Cardiovascular: Negative.   Gastrointestinal: Negative.   Genitourinary: Negative.   Musculoskeletal: Positive for back pain  and joint pain. Negative for myalgias, falls and neck pain.       Left knee pain  Skin: Negative.   Neurological: Negative.  Negative for weakness.  Endo/Heme/Allergies: Negative for environmental allergies and polydipsia. Bruises/bleeds easily.  Psychiatric/Behavioral: Negative.     Objective:  Physical Exam  Constitutional: She is oriented to person, place, and time. She appears well-developed. No distress.  Morbidly obese  HENT:  Head: Normocephalic and atraumatic.  Right Ear: External ear normal.  Left Ear: External ear normal.  Nose: Nose normal.  Eyes: Conjunctivae and EOM are normal.  Neck: Normal range of motion. Neck supple.  Cardiovascular: Normal rate, normal heart sounds and intact distal pulses.  An irregularly irregular rhythm present.  No murmur heard. Respiratory: Effort normal. No respiratory distress. She has decreased breath sounds. She has no wheezes.  GI: Soft. Bowel sounds are normal. She exhibits no distension. There is no tenderness.  Musculoskeletal:       Right hip: Normal.       Left hip: Normal.       Right knee: Normal.       Left knee: She exhibits decreased range of motion and swelling. She exhibits no effusion and no erythema. Tenderness found. Medial joint line and lateral joint line tenderness noted.  Neurological: She is alert and oriented to person, place, and time. She has normal strength and normal reflexes. No sensory deficit.  Skin: No rash noted. She is not diaphoretic. No erythema.  Psychiatric: She has a normal mood and affect. Her behavior is normal.   Vitals  Weight: 241 Height: 64in Body Surface Area: 2.08 m Body Mass Index: 41.10 kg/m  Pulse: 76 (Regular)  BP: 142/76 (Sitting, Left Arm, Standard)  Imaging Review Plain radiographs demonstrate severe degenerative joint disease of the left knee(s). The overall alignment issignificant varus. The bone quality appears to be fair for age and reported activity  level.  Assessment/Plan:  End stage primary osteoarthritis, left knee   The patient history, physical examination, clinical judgment of the provider and imaging studies are consistent with end stage degenerative joint disease of the left knee(s) and total knee arthroplasty is deemed medically necessary. The treatment options including medical management, injection therapy arthroscopy and  arthroplasty were discussed at length. The risks and benefits of total knee arthroplasty were presented and reviewed. The risks due to aseptic loosening, infection, stiffness, patella tracking problems, thromboembolic complications and other imponderables were discussed. The patient acknowledged the explanation, agreed to proceed with the plan and consent was signed. Patient is being admitted for inpatient treatment for surgery, pain control, PT, OT, prophylactic antibiotics, VTE prophylaxis, progressive ambulation and ADL's and discharge planning. The patient is planning to be discharged to skilled nursing facility (return to [place] where she currently lives)   Topical TXA Cardio: Dr. Bronson Ing PCP: Dr. Sherrie Sport Wants CPM post op and upon discharge   [facility] PA-C

## 2015-03-18 MED ORDER — VANCOMYCIN HCL 10 G IV SOLR
1500.0000 mg | INTRAVENOUS | Status: AC
  Administered 2015-03-19 (×2): 1500 mg via INTRAVENOUS
  Filled 2015-03-18: qty 1500

## 2015-03-19 ENCOUNTER — Inpatient Hospital Stay (HOSPITAL_COMMUNITY): Payer: Medicare HMO | Admitting: Certified Registered Nurse Anesthetist

## 2015-03-19 ENCOUNTER — Encounter (HOSPITAL_COMMUNITY): Payer: Self-pay | Admitting: *Deleted

## 2015-03-19 ENCOUNTER — Inpatient Hospital Stay (HOSPITAL_COMMUNITY)
Admission: RE | Admit: 2015-03-19 | Discharge: 2015-03-21 | DRG: 470 | Disposition: A | Discharge status: Skilled Nursing Facility | Payer: Medicare HMO | Source: Ambulatory Visit | Attending: Orthopedic Surgery | Admitting: Orthopedic Surgery

## 2015-03-19 ENCOUNTER — Encounter (HOSPITAL_COMMUNITY)
Admission: RE | Disposition: A | Discharge status: Skilled Nursing Facility | Payer: Self-pay | Source: Ambulatory Visit | Attending: Orthopedic Surgery

## 2015-03-19 DIAGNOSIS — I5032 Chronic diastolic (congestive) heart failure: Secondary | ICD-10-CM | POA: Diagnosis present

## 2015-03-19 DIAGNOSIS — Z880 Allergy status to penicillin: Secondary | ICD-10-CM | POA: Diagnosis not present

## 2015-03-19 DIAGNOSIS — I739 Peripheral vascular disease, unspecified: Secondary | ICD-10-CM | POA: Diagnosis present

## 2015-03-19 DIAGNOSIS — Z6841 Body Mass Index (BMI) 40.0 and over, adult: Secondary | ICD-10-CM

## 2015-03-19 DIAGNOSIS — Z86010 Personal history of colonic polyps: Secondary | ICD-10-CM

## 2015-03-19 DIAGNOSIS — Z833 Family history of diabetes mellitus: Secondary | ICD-10-CM

## 2015-03-19 DIAGNOSIS — Z79891 Long term (current) use of opiate analgesic: Secondary | ICD-10-CM | POA: Diagnosis not present

## 2015-03-19 DIAGNOSIS — I480 Paroxysmal atrial fibrillation: Secondary | ICD-10-CM | POA: Diagnosis present

## 2015-03-19 DIAGNOSIS — Z794 Long term (current) use of insulin: Secondary | ICD-10-CM | POA: Diagnosis not present

## 2015-03-19 DIAGNOSIS — Z87891 Personal history of nicotine dependence: Secondary | ICD-10-CM | POA: Diagnosis not present

## 2015-03-19 DIAGNOSIS — I129 Hypertensive chronic kidney disease with stage 1 through stage 4 chronic kidney disease, or unspecified chronic kidney disease: Secondary | ICD-10-CM | POA: Diagnosis present

## 2015-03-19 DIAGNOSIS — Z951 Presence of aortocoronary bypass graft: Secondary | ICD-10-CM | POA: Diagnosis not present

## 2015-03-19 DIAGNOSIS — N183 Chronic kidney disease, stage 3 (moderate): Secondary | ICD-10-CM | POA: Diagnosis present

## 2015-03-19 DIAGNOSIS — Z950 Presence of cardiac pacemaker: Secondary | ICD-10-CM

## 2015-03-19 DIAGNOSIS — Z96651 Presence of right artificial knee joint: Secondary | ICD-10-CM | POA: Diagnosis present

## 2015-03-19 DIAGNOSIS — K219 Gastro-esophageal reflux disease without esophagitis: Secondary | ICD-10-CM | POA: Diagnosis present

## 2015-03-19 DIAGNOSIS — I2510 Atherosclerotic heart disease of native coronary artery without angina pectoris: Secondary | ICD-10-CM | POA: Diagnosis present

## 2015-03-19 DIAGNOSIS — I482 Chronic atrial fibrillation: Secondary | ICD-10-CM | POA: Diagnosis present

## 2015-03-19 DIAGNOSIS — Z823 Family history of stroke: Secondary | ICD-10-CM | POA: Diagnosis not present

## 2015-03-19 DIAGNOSIS — M25562 Pain in left knee: Secondary | ICD-10-CM | POA: Diagnosis present

## 2015-03-19 DIAGNOSIS — M1712 Unilateral primary osteoarthritis, left knee: Principal | ICD-10-CM | POA: Diagnosis present

## 2015-03-19 DIAGNOSIS — E785 Hyperlipidemia, unspecified: Secondary | ICD-10-CM | POA: Diagnosis present

## 2015-03-19 DIAGNOSIS — Z96652 Presence of left artificial knee joint: Secondary | ICD-10-CM

## 2015-03-19 DIAGNOSIS — Z807 Family history of other malignant neoplasms of lymphoid, hematopoietic and related tissues: Secondary | ICD-10-CM | POA: Diagnosis not present

## 2015-03-19 DIAGNOSIS — Z7901 Long term (current) use of anticoagulants: Secondary | ICD-10-CM

## 2015-03-19 DIAGNOSIS — Z96659 Presence of unspecified artificial knee joint: Secondary | ICD-10-CM

## 2015-03-19 DIAGNOSIS — E1122 Type 2 diabetes mellitus with diabetic chronic kidney disease: Secondary | ICD-10-CM | POA: Diagnosis present

## 2015-03-19 DIAGNOSIS — Z79899 Other long term (current) drug therapy: Secondary | ICD-10-CM | POA: Diagnosis not present

## 2015-03-19 HISTORY — PX: TOTAL KNEE ARTHROPLASTY: SHX125

## 2015-03-19 LAB — GLUCOSE, CAPILLARY
GLUCOSE-CAPILLARY: 180 mg/dL — AB (ref 65–99)
GLUCOSE-CAPILLARY: 232 mg/dL — AB (ref 65–99)
Glucose-Capillary: 120 mg/dL — ABNORMAL HIGH (ref 65–99)

## 2015-03-19 LAB — TYPE AND SCREEN
ABO/RH(D): O NEG
Antibody Screen: NEGATIVE

## 2015-03-19 LAB — PROTIME-INR
INR: 1.12 (ref 0.00–1.49)
Prothrombin Time: 14.6 seconds (ref 11.6–15.2)

## 2015-03-19 SURGERY — ARTHROPLASTY, KNEE, TOTAL
Anesthesia: General | Site: Knee | Laterality: Left

## 2015-03-19 MED ORDER — OXYCODONE-ACETAMINOPHEN 5-325 MG PO TABS
2.0000 | ORAL_TABLET | ORAL | Status: DC | PRN
  Administered 2015-03-20 – 2015-03-21 (×3): 2 via ORAL
  Filled 2015-03-19 (×3): qty 2

## 2015-03-19 MED ORDER — EPHEDRINE SULFATE 50 MG/ML IJ SOLN
INTRAMUSCULAR | Status: AC
  Filled 2015-03-19: qty 1

## 2015-03-19 MED ORDER — METHOCARBAMOL 500 MG PO TABS: 500.0000 mg | ORAL_TABLET | Freq: Four times a day (QID) | ORAL | Status: DC | PRN

## 2015-03-19 MED ORDER — THROMBIN 5000 UNITS EX SOLR
CUTANEOUS | Status: AC
  Filled 2015-03-19: qty 5000

## 2015-03-19 MED ORDER — FUROSEMIDE 40 MG PO TABS
80.0000 mg | ORAL_TABLET | Freq: Three times a day (TID) | ORAL | Status: DC
  Administered 2015-03-19 – 2015-03-21 (×6): 80 mg via ORAL
  Filled 2015-03-19 (×6): qty 2

## 2015-03-19 MED ORDER — WARFARIN SODIUM 5 MG PO TABS
5.0000 mg | ORAL_TABLET | Freq: Once | ORAL | Status: AC
  Administered 2015-03-19: 5 mg via ORAL
  Filled 2015-03-19: qty 1

## 2015-03-19 MED ORDER — FERROUS SULFATE 325 (65 FE) MG PO TABS
325.0000 mg | ORAL_TABLET | Freq: Three times a day (TID) | ORAL | Status: DC
  Administered 2015-03-20 – 2015-03-21 (×5): 325 mg via ORAL
  Filled 2015-03-19 (×5): qty 1

## 2015-03-19 MED ORDER — FENTANYL CITRATE (PF) 100 MCG/2ML IJ SOLN
INTRAMUSCULAR | Status: DC | PRN
  Administered 2015-03-19: 25 ug via INTRAVENOUS
  Administered 2015-03-19 (×3): 50 ug via INTRAVENOUS
  Administered 2015-03-19: 25 ug via INTRAVENOUS
  Administered 2015-03-19: 50 ug via INTRAVENOUS

## 2015-03-19 MED ORDER — ALUM & MAG HYDROXIDE-SIMETH 200-200-20 MG/5ML PO SUSP: 30.0000 mL | ORAL | Status: DC | PRN

## 2015-03-19 MED ORDER — FENTANYL CITRATE (PF) 100 MCG/2ML IJ SOLN
INTRAMUSCULAR | Status: AC
  Filled 2015-03-19: qty 2

## 2015-03-19 MED ORDER — ALPRAZOLAM 0.25 MG PO TABS: 0.2500 mg | ORAL_TABLET | Freq: Two times a day (BID) | ORAL | Status: DC | PRN

## 2015-03-19 MED ORDER — BACITRACIN ZINC 500 UNIT/GM EX OINT
TOPICAL_OINTMENT | CUTANEOUS | Status: AC
  Filled 2015-03-19: qty 28.35

## 2015-03-19 MED ORDER — FENTANYL CITRATE (PF) 100 MCG/2ML IJ SOLN
INTRAMUSCULAR | Status: AC
  Filled 2015-03-19: qty 4

## 2015-03-19 MED ORDER — BISACODYL 5 MG PO TBEC: 5.0000 mg | DELAYED_RELEASE_TABLET | Freq: Every day | ORAL | Status: DC | PRN

## 2015-03-19 MED ORDER — GLYCOPYRROLATE 0.2 MG/ML IJ SOLN
INTRAMUSCULAR | Status: DC | PRN
  Administered 2015-03-19: 0.4 mg via INTRAVENOUS

## 2015-03-19 MED ORDER — GELATIN ABSORBABLE MT POWD
OROMUCOSAL | Status: DC | PRN
  Administered 2015-03-19: 5000 mL via TOPICAL

## 2015-03-19 MED ORDER — SUCCINYLCHOLINE CHLORIDE 20 MG/ML IJ SOLN
INTRAMUSCULAR | Status: DC | PRN
  Administered 2015-03-19: 100 mg via INTRAVENOUS

## 2015-03-19 MED ORDER — NEOSTIGMINE METHYLSULFATE 10 MG/10ML IV SOLN
INTRAVENOUS | Status: DC | PRN
  Administered 2015-03-19: 3 mg via INTRAVENOUS

## 2015-03-19 MED ORDER — POTASSIUM CHLORIDE CRYS ER 20 MEQ PO TBCR
20.0000 meq | EXTENDED_RELEASE_TABLET | Freq: Once | ORAL | Status: AC
  Administered 2015-03-19: 20 meq via ORAL
  Filled 2015-03-19: qty 1

## 2015-03-19 MED ORDER — LIDOCAINE HCL (CARDIAC) 20 MG/ML IV SOLN
INTRAVENOUS | Status: DC | PRN
  Administered 2015-03-19: 75 mg via INTRAVENOUS

## 2015-03-19 MED ORDER — FUROSEMIDE 10 MG/ML IJ SOLN
INTRAMUSCULAR | Status: AC
Start: 2015-03-19 — End: 2015-03-20
  Filled 2015-03-19: qty 2

## 2015-03-19 MED ORDER — ACETAMINOPHEN 10 MG/ML IV SOLN
INTRAVENOUS | Status: AC
  Filled 2015-03-19: qty 100

## 2015-03-19 MED ORDER — CHLORHEXIDINE GLUCONATE 4 % EX LIQD: 60.0000 mL | Freq: Once | CUTANEOUS | Status: DC

## 2015-03-19 MED ORDER — GLYCOPYRROLATE 0.2 MG/ML IJ SOLN
INTRAMUSCULAR | Status: AC
  Filled 2015-03-19: qty 2

## 2015-03-19 MED ORDER — NEOSTIGMINE METHYLSULFATE 10 MG/10ML IV SOLN
INTRAVENOUS | Status: AC
  Filled 2015-03-19: qty 1

## 2015-03-19 MED ORDER — SODIUM CHLORIDE 0.9 % IJ SOLN
INTRAMUSCULAR | Status: AC
  Filled 2015-03-19: qty 50

## 2015-03-19 MED ORDER — LIDOCAINE HCL (CARDIAC) 20 MG/ML IV SOLN
INTRAVENOUS | Status: AC
  Filled 2015-03-19: qty 5

## 2015-03-19 MED ORDER — METOPROLOL TARTRATE 25 MG PO TABS
25.0000 mg | ORAL_TABLET | Freq: Two times a day (BID) | ORAL | Status: DC
  Administered 2015-03-19 – 2015-03-21 (×4): 25 mg via ORAL
  Filled 2015-03-19 (×4): qty 1

## 2015-03-19 MED ORDER — POLYETHYLENE GLYCOL 3350 17 G PO PACK: 17.0000 g | PACK | Freq: Every day | ORAL | Status: DC | PRN

## 2015-03-19 MED ORDER — BUPIVACAINE LIPOSOME 1.3 % IJ SUSP
20.0000 mL | Freq: Once | INTRAMUSCULAR | Status: DC
  Filled 2015-03-19: qty 20

## 2015-03-19 MED ORDER — TRANEXAMIC ACID 1000 MG/10ML IV SOLN
2000.0000 mg | Freq: Once | INTRAVENOUS | Status: AC
  Administered 2015-03-19: 2000 mg via TOPICAL
  Filled 2015-03-19: qty 20

## 2015-03-19 MED ORDER — HYDROCODONE-ACETAMINOPHEN 10-325 MG PO TABS
1.0000 | ORAL_TABLET | ORAL | Status: DC | PRN
  Administered 2015-03-19 – 2015-03-21 (×3): 2 via ORAL
  Filled 2015-03-19 (×3): qty 2

## 2015-03-19 MED ORDER — SODIUM CHLORIDE 0.9 % IJ SOLN
INTRAMUSCULAR | Status: AC
  Filled 2015-03-19: qty 10

## 2015-03-19 MED ORDER — BUPIVACAINE HCL (PF) 0.25 % IJ SOLN
INTRAMUSCULAR | Status: AC
  Filled 2015-03-19: qty 30

## 2015-03-19 MED ORDER — ONDANSETRON HCL 4 MG/2ML IJ SOLN
INTRAMUSCULAR | Status: DC | PRN
  Administered 2015-03-19 (×2): 2 mg via INTRAVENOUS

## 2015-03-19 MED ORDER — ONDANSETRON HCL 4 MG/2ML IJ SOLN: 4.0000 mg | Freq: Four times a day (QID) | INTRAMUSCULAR | Status: DC | PRN

## 2015-03-19 MED ORDER — ONDANSETRON HCL 4 MG PO TABS: 4.0000 mg | ORAL_TABLET | Freq: Four times a day (QID) | ORAL | Status: DC | PRN

## 2015-03-19 MED ORDER — SODIUM CHLORIDE 0.9 % IV SOLN
10.0000 mg | INTRAVENOUS | Status: DC | PRN
  Administered 2015-03-19: 40 ug/min via INTRAVENOUS

## 2015-03-19 MED ORDER — MIDAZOLAM HCL 2 MG/2ML IJ SOLN
INTRAMUSCULAR | Status: AC
  Filled 2015-03-19: qty 2

## 2015-03-19 MED ORDER — VANCOMYCIN HCL IN DEXTROSE 1-5 GM/200ML-% IV SOLN
1000.0000 mg | Freq: Two times a day (BID) | INTRAVENOUS | Status: AC
  Administered 2015-03-20: 1000 mg via INTRAVENOUS
  Filled 2015-03-19: qty 200

## 2015-03-19 MED ORDER — SODIUM CHLORIDE 0.9 % IR SOLN
Status: AC
  Filled 2015-03-19: qty 1

## 2015-03-19 MED ORDER — LEVOTHYROXINE SODIUM 25 MCG PO TABS
125.0000 ug | ORAL_TABLET | Freq: Every day | ORAL | Status: DC
  Administered 2015-03-20 – 2015-03-21 (×2): 125 ug via ORAL
  Filled 2015-03-19 (×4): qty 1

## 2015-03-19 MED ORDER — LABETALOL HCL 5 MG/ML IV SOLN
INTRAVENOUS | Status: DC | PRN
Start: 2015-03-19 — End: 2015-03-19
  Administered 2015-03-19: 2.5 mg via INTRAVENOUS
  Administered 2015-03-19: 5 mg via INTRAVENOUS

## 2015-03-19 MED ORDER — DEXAMETHASONE SODIUM PHOSPHATE 10 MG/ML IJ SOLN
INTRAMUSCULAR | Status: DC | PRN
  Administered 2015-03-19: 10 mg via INTRAVENOUS

## 2015-03-19 MED ORDER — ROCURONIUM BROMIDE 100 MG/10ML IV SOLN
INTRAVENOUS | Status: DC | PRN
  Administered 2015-03-19 (×2): 10 mg via INTRAVENOUS
  Administered 2015-03-19: 30 mg via INTRAVENOUS

## 2015-03-19 MED ORDER — SODIUM CHLORIDE 0.9 % IV SOLN
INTRAVENOUS | Status: DC | PRN
  Administered 2015-03-19 (×2): via INTRAVENOUS

## 2015-03-19 MED ORDER — BUPIVACAINE LIPOSOME 1.3 % IJ SUSP
INTRAMUSCULAR | Status: DC | PRN
Start: 2015-03-19 — End: 2015-03-19
  Administered 2015-03-19: 20 mL

## 2015-03-19 MED ORDER — PHENOL 1.4 % MT LIQD: 1.0000 | OROMUCOSAL | Status: DC | PRN

## 2015-03-19 MED ORDER — PRAVASTATIN SODIUM 40 MG PO TABS
40.0000 mg | ORAL_TABLET | Freq: Every evening | ORAL | Status: DC
  Administered 2015-03-19 – 2015-03-20 (×2): 40 mg via ORAL
  Filled 2015-03-19 (×2): qty 1

## 2015-03-19 MED ORDER — ACETAMINOPHEN 325 MG PO TABS: 650.0000 mg | ORAL_TABLET | Freq: Four times a day (QID) | ORAL | Status: DC | PRN

## 2015-03-19 MED ORDER — ACETAMINOPHEN 10 MG/ML IV SOLN
1000.0000 mg | Freq: Once | INTRAVENOUS | Status: AC
  Administered 2015-03-19: 1000 mg via INTRAVENOUS

## 2015-03-19 MED ORDER — ONDANSETRON HCL 4 MG/2ML IJ SOLN
INTRAMUSCULAR | Status: AC
  Filled 2015-03-19: qty 2

## 2015-03-19 MED ORDER — BUPIVACAINE HCL (PF) 0.25 % IJ SOLN
INTRAMUSCULAR | Status: DC | PRN
  Administered 2015-03-19: 20 mL

## 2015-03-19 MED ORDER — ACETAMINOPHEN 650 MG RE SUPP: 650.0000 mg | Freq: Four times a day (QID) | RECTAL | Status: DC | PRN

## 2015-03-19 MED ORDER — SODIUM CHLORIDE 0.9 % IJ SOLN
INTRAMUSCULAR | Status: DC | PRN
  Administered 2015-03-19: 20 mL

## 2015-03-19 MED ORDER — INSULIN ASPART 100 UNIT/ML ~~LOC~~ SOLN
0.0000 [IU] | Freq: Three times a day (TID) | SUBCUTANEOUS | Status: DC
  Administered 2015-03-20: 3 [IU] via SUBCUTANEOUS
  Administered 2015-03-20 (×2): 5 [IU] via SUBCUTANEOUS
  Administered 2015-03-21: 3 [IU] via SUBCUTANEOUS
  Administered 2015-03-21: 5 [IU] via SUBCUTANEOUS

## 2015-03-19 MED ORDER — PROPOFOL 10 MG/ML IV BOLUS
INTRAVENOUS | Status: DC | PRN
  Administered 2015-03-19: 50 mg via INTRAVENOUS
  Administered 2015-03-19: 70 mg via INTRAVENOUS
  Administered 2015-03-19: 130 mg via INTRAVENOUS

## 2015-03-19 MED ORDER — LACTATED RINGERS IV SOLN: INTRAVENOUS | Status: DC

## 2015-03-19 MED ORDER — FLEET ENEMA 7-19 GM/118ML RE ENEM: 1.0000 | ENEMA | Freq: Once | RECTAL | Status: DC | PRN

## 2015-03-19 MED ORDER — HYDROMORPHONE HCL 1 MG/ML IJ SOLN: 0.2500 mg | INTRAMUSCULAR | Status: DC | PRN

## 2015-03-19 MED ORDER — SODIUM CHLORIDE 0.9 % IV SOLN
INTRAVENOUS | Status: DC
  Administered 2015-03-19 – 2015-03-20 (×2): via INTRAVENOUS

## 2015-03-19 MED ORDER — DEXAMETHASONE SODIUM PHOSPHATE 10 MG/ML IJ SOLN
INTRAMUSCULAR | Status: AC
  Filled 2015-03-19: qty 1

## 2015-03-19 MED ORDER — HYDROMORPHONE HCL 1 MG/ML IJ SOLN
1.0000 mg | INTRAMUSCULAR | Status: DC | PRN
  Administered 2015-03-20: 1 mg via INTRAVENOUS
  Filled 2015-03-19: qty 1

## 2015-03-19 MED ORDER — PROPOFOL 10 MG/ML IV BOLUS
INTRAVENOUS | Status: AC
  Filled 2015-03-19: qty 20

## 2015-03-19 MED ORDER — METHOCARBAMOL 1000 MG/10ML IJ SOLN
500.0000 mg | Freq: Four times a day (QID) | INTRAVENOUS | Status: DC | PRN
  Filled 2015-03-19: qty 5

## 2015-03-19 MED ORDER — WARFARIN - PHARMACIST DOSING INPATIENT: Freq: Every day | Status: DC

## 2015-03-19 MED ORDER — LACTATED RINGERS IV SOLN
INTRAVENOUS | Status: DC
  Administered 2015-03-19: 1000 mL via INTRAVENOUS

## 2015-03-19 MED ORDER — CITALOPRAM HYDROBROMIDE 20 MG PO TABS
20.0000 mg | ORAL_TABLET | Freq: Every morning | ORAL | Status: DC
  Administered 2015-03-20 – 2015-03-21 (×2): 20 mg via ORAL
  Filled 2015-03-19 (×2): qty 1

## 2015-03-19 MED ORDER — ALBUTEROL SULFATE HFA 108 (90 BASE) MCG/ACT IN AERS
INHALATION_SPRAY | RESPIRATORY_TRACT | Status: DC | PRN
  Administered 2015-03-19: 3 via RESPIRATORY_TRACT
  Administered 2015-03-19: 5 via RESPIRATORY_TRACT

## 2015-03-19 MED ORDER — MENTHOL 3 MG MT LOZG: 1.0000 | LOZENGE | OROMUCOSAL | Status: DC | PRN

## 2015-03-19 MED ORDER — FUROSEMIDE 10 MG/ML IJ SOLN
INTRAMUSCULAR | Status: DC | PRN
Start: 2015-03-19 — End: 2015-03-19
  Administered 2015-03-19: 20 mg via INTRAMUSCULAR

## 2015-03-19 MED ORDER — FENTANYL CITRATE (PF) 100 MCG/2ML IJ SOLN
INTRAMUSCULAR | Status: AC
Start: 2015-03-19 — End: 2015-03-19
  Filled 2015-03-19: qty 4

## 2015-03-19 SURGICAL SUPPLY — 79 items
BAG DECANTER FOR FLEXI CONT (MISCELLANEOUS) ×2 IMPLANT
BAG SPEC THK2 15X12 ZIP CLS (MISCELLANEOUS)
BAG ZIPLOCK 12X15 (MISCELLANEOUS) IMPLANT
BANDAGE ELASTIC 4 VELCRO ST LF (GAUZE/BANDAGES/DRESSINGS) ×3 IMPLANT
BANDAGE ELASTIC 6 VELCRO ST LF (GAUZE/BANDAGES/DRESSINGS) ×3 IMPLANT
BANDAGE ESMARK 6X9 LF (GAUZE/BANDAGES/DRESSINGS) ×1 IMPLANT
BLADE SAG 18X100X1.27 (BLADE) ×3 IMPLANT
BLADE SAW SGTL 11.0X1.19X90.0M (BLADE) ×3 IMPLANT
BNDG CMPR 9X6 STRL LF SNTH (GAUZE/BANDAGES/DRESSINGS) ×1
BNDG ESMARK 6X9 LF (GAUZE/BANDAGES/DRESSINGS) ×3
BONE CEMENT GENTAMICIN (Cement) ×6 IMPLANT
CAP KNEE TOTAL 3 SIGMA ×2 IMPLANT
CEMENT BONE GENTAMICIN 40 (Cement) ×2 IMPLANT
CUFF TOURN SGL QUICK 34 (TOURNIQUET CUFF) ×3
CUFF TRNQT CYL 34X4X40X1 (TOURNIQUET CUFF) ×1 IMPLANT
DECANTER SPIKE VIAL GLASS SM (MISCELLANEOUS) ×3 IMPLANT
DRAPE EXTREMITY T 121X128X90 (DRAPE) ×3 IMPLANT
DRAPE INCISE IOBAN 66X45 STRL (DRAPES) IMPLANT
DRAPE POUCH INSTRU U-SHP 10X18 (DRAPES) ×3 IMPLANT
DRAPE SHEET LG 3/4 BI-LAMINATE (DRAPES) ×3 IMPLANT
DRAPE U-SHAPE 47X51 STRL (DRAPES) ×3 IMPLANT
DRSG AQUACEL AG ADV 3.5X10 (GAUZE/BANDAGES/DRESSINGS) ×3 IMPLANT
DRSG PAD ABDOMINAL 8X10 ST (GAUZE/BANDAGES/DRESSINGS) ×6 IMPLANT
DRSG TEGADERM 4X4.75 (GAUZE/BANDAGES/DRESSINGS) ×3 IMPLANT
DURAPREP 26ML APPLICATOR (WOUND CARE) ×3 IMPLANT
ELECT REM PT RETURN 9FT ADLT (ELECTROSURGICAL) ×3
ELECTRODE REM PT RTRN 9FT ADLT (ELECTROSURGICAL) ×1 IMPLANT
EVACUATOR 1/8 PVC DRAIN (DRAIN) ×3 IMPLANT
FACESHIELD WRAPAROUND (MASK) ×18 IMPLANT
FACESHIELD WRAPAROUND OR TEAM (MASK) ×5 IMPLANT
GAUZE SPONGE 2X2 8PLY STRL LF (GAUZE/BANDAGES/DRESSINGS) ×1 IMPLANT
GLOVE BIOGEL PI IND STRL 6.5 (GLOVE) ×1 IMPLANT
GLOVE BIOGEL PI IND STRL 8 (GLOVE) ×1 IMPLANT
GLOVE BIOGEL PI INDICATOR 6.5 (GLOVE) ×2
GLOVE BIOGEL PI INDICATOR 8 (GLOVE) ×2
GLOVE ECLIPSE 8.0 STRL XLNG CF (GLOVE) ×6 IMPLANT
GLOVE SURG SS PI 6.5 STRL IVOR (GLOVE) ×3 IMPLANT
GLOVE SURG SS PI 7.0 STRL IVOR (GLOVE) ×6 IMPLANT
GOWN STRL REUS W/TWL LRG LVL3 (GOWN DISPOSABLE) ×3 IMPLANT
GOWN STRL REUS W/TWL XL LVL3 (GOWN DISPOSABLE) ×3 IMPLANT
HANDPIECE INTERPULSE COAX TIP (DISPOSABLE) ×3
IMMOBILIZER KNEE 20 (SOFTGOODS) ×3
IMMOBILIZER KNEE 20 THIGH 36 (SOFTGOODS) ×1 IMPLANT
KIT BASIN OR (CUSTOM PROCEDURE TRAY) ×3 IMPLANT
LIQUID BAND (GAUZE/BANDAGES/DRESSINGS) ×3 IMPLANT
MANIFOLD NEPTUNE II (INSTRUMENTS) ×3 IMPLANT
NDL SAFETY ECLIPSE 18X1.5 (NEEDLE) ×2 IMPLANT
NEEDLE HYPO 18GX1.5 SHARP (NEEDLE) ×6
NEEDLE HYPO 22GX1.5 SAFETY (NEEDLE) ×3 IMPLANT
NS IRRIG 1000ML POUR BTL (IV SOLUTION) IMPLANT
PACK TOTAL JOINT (CUSTOM PROCEDURE TRAY) ×3 IMPLANT
PADDING CAST COTTON 6X4 STRL (CAST SUPPLIES) ×2 IMPLANT
PEN SKIN MARKING BROAD (MISCELLANEOUS) ×3 IMPLANT
POSITIONER SURGICAL ARM (MISCELLANEOUS) ×3 IMPLANT
SET HNDPC FAN SPRY TIP SCT (DISPOSABLE) ×1 IMPLANT
SET PAD KNEE POSITIONER (MISCELLANEOUS) ×3 IMPLANT
SPONGE GAUZE 2X2 STER 10/PKG (GAUZE/BANDAGES/DRESSINGS) ×2
SPONGE LAP 18X18 X RAY DECT (DISPOSABLE) ×2 IMPLANT
SPONGE SURGIFOAM ABS GEL 100 (HEMOSTASIS) ×3 IMPLANT
STAPLER VISISTAT 35W (STAPLE) IMPLANT
SUCTION FRAZIER 12FR DISP (SUCTIONS) ×5 IMPLANT
SUT BONE WAX W31G (SUTURE) ×3 IMPLANT
SUT MNCRL AB 4-0 PS2 18 (SUTURE) ×3 IMPLANT
SUT VIC AB 1 CT1 27 (SUTURE) ×9
SUT VIC AB 1 CT1 27XBRD ANTBC (SUTURE) ×2 IMPLANT
SUT VIC AB 2-0 CT1 27 (SUTURE) ×12
SUT VIC AB 2-0 CT1 TAPERPNT 27 (SUTURE) ×3 IMPLANT
SUT VLOC 180 0 24IN GS25 (SUTURE) ×3 IMPLANT
SYR 20CC LL (SYRINGE) ×6 IMPLANT
SYR 50ML LL SCALE MARK (SYRINGE) ×3 IMPLANT
TOWEL OR 17X26 10 PK STRL BLUE (TOWEL DISPOSABLE) ×3 IMPLANT
TOWEL OR NON WOVEN STRL DISP B (DISPOSABLE) ×2 IMPLANT
TOWER CARTRIDGE SMART MIX (DISPOSABLE) ×3 IMPLANT
TRAY FOLEY W/METER SILVER 14FR (SET/KITS/TRAYS/PACK) ×3 IMPLANT
TRAY FOLEY W/METER SILVER 16FR (SET/KITS/TRAYS/PACK) ×1 IMPLANT
TRAY REVISION SZ 3 (Knees) ×2 IMPLANT
WATER STERILE IRR 1500ML POUR (IV SOLUTION) ×3 IMPLANT
WRAP KNEE MAXI GEL POST OP (GAUZE/BANDAGES/DRESSINGS) ×3 IMPLANT
YANKAUER SUCT BULB TIP 10FT TU (MISCELLANEOUS) ×3 IMPLANT

## 2015-03-19 NOTE — Anesthesia Procedure Notes (Signed)
Procedure Name: Intubation Date/Time: 03/19/2015 1:12 PM Performed by: Ofilia Neas Pre-anesthesia Checklist: Patient identified, Timeout performed, Emergency Drugs available, Suction available and Patient being monitored Patient Re-evaluated:Patient Re-evaluated prior to inductionOxygen Delivery Method: Circle system utilized Preoxygenation: Pre-oxygenation with 100% oxygen Intubation Type: IV induction Ventilation: Mask ventilation without difficulty Laryngoscope Size: Mac and 3 Grade View: Grade I Tube type: Oral Tube size: 7.5 mm Number of attempts: 1 Airway Equipment and Method: Stylet Placement Confirmation: ETT inserted through vocal cords under direct vision,  positive ETCO2 and breath sounds checked- equal and bilateral Secured at: 21 cm Tube secured with: Tape Dental Injury: Teeth and Oropharynx as per pre-operative assessment

## 2015-03-19 NOTE — Progress Notes (Signed)
ANTICOAGULATION CONSULT NOTE - Initial Consult  Pharmacy Consult for Warfarin Indication: atrial fibrillation, chronic Warfarin PTA resumed  Allergies  Allergen Reactions  . Penicillins Other (See Comments)    Pt. Not sure of reaction.   Patient Measurements: Height: 5' (152.4 cm) Weight: 237 lb 3.2 oz (107.593 kg) IBW/kg (Calculated) : 45.5  Vital Signs: Temp: 97.8 F (36.6 C) (08/19 1713) Temp Source: Oral (08/19 0937) BP: 143/64 mmHg (08/19 1713) Pulse Rate: 82 (08/19 1713)  Labs:  Recent Labs  03/19/15 1050  LABPROT 14.6  INR 1.12   Estimated Creatinine Clearance: 36.6 mL/min (by C-G formula based on Cr of 1.36).  Medical History: Past Medical History  Diagnosis Date  . Anemia 2008  . DM (diabetes mellitus) 1999  . Cataract     bilaterla   . Permanent atrial fibrillation   . CAD (coronary artery disease)   . CRI (chronic renal insufficiency) 2008  . Hypertension   . Hyperlipemia   . GERD (gastroesophageal reflux disease)   . Arthritis   . Acute respiratory failure with hypoxia 06/12/2013    secondary to acute on chronic diastolic failure.  . Wrist fracture, left 06/13/2013    03-12-15 no residual issues  . Acute on chronic congestive heart failure with left ventricular diastolic dysfunction 27/09/5007  . Hx of CABG 10/24/2007    Dr. Roxan Hockey  . S/P CABG x 4   . Pacemaker 10/24/2007    Medtronic EnRhythm; implanted for SSS, symptomatic bradycardia, PAF  . Fracture November 2014    Left wrist-03-12-15 no issues now  . Obesity   . Transfusion history   . Venous insufficiency     8-12-16left lower lateral outside healed leg ulcer-scabbed over.   Medications:  Scheduled:  . [START ON 03/20/2015] citalopram  20 mg Oral q morning - 10a  . [START ON 03/20/2015] ferrous sulfate  325 mg Oral TID PC  . furosemide      . furosemide  80 mg Oral TID  . [START ON 03/20/2015] insulin aspart  0-15 Units Subcutaneous TID WC  . [START ON 03/20/2015] levothyroxine   125 mcg Oral QAC breakfast  . metoprolol tartrate  25 mg Oral BID  . pravastatin  40 mg Oral QPM  . [START ON 03/20/2015] vancomycin  1,000 mg Intravenous Q12H   Assessment: 82 yoF s/p L TKA, on chronic Warfarin 5mg  daily for Afib, last dose 8/13. Lovenox bridging from 8/14 of 100mg  bid with last dose 8/18 pm. INR this am 1.12  Goal of Therapy:  INR 2-3 Monitor platelets by anticoagulation protocol: Yes   Plan:   Warfarin 5mg  tonight  Daily PT/INR and CBC ordered  Minda Ditto PharmD Pager [phone] 03/19/2015, 6:54 PM

## 2015-03-19 NOTE — Anesthesia Postprocedure Evaluation (Addendum)
  Anesthesia Post-op Note  Patient: [name]  Procedure(s) Performed: Procedure(s) (LRB): LEFT TOTAL KNEE ARTHROPLASTY (Left)  Patient Location: PACU  Anesthesia Type: General  Level of Consciousness: awake and alert   Airway and Oxygen Therapy: Patient Spontanous Breathing  Post-op Pain: mild  Post-op Assessment: Post-op Vital signs reviewed, Patient's Cardiovascular Status Stable, Respiratory Function Stable, Patent Airway and No signs of Nausea or vomiting. She denies dyspnea.  Last Vitals:  Filed Vitals:   03/19/15 1645  BP: 129/49  Pulse: 83  Temp:   Resp: 21   Oxygen saturation in mid to high 90s on 3 L/min [place]  Post-op Vital Signs: stable   Complications: No apparent anesthesia complications. To telemetry per plan.

## 2015-03-19 NOTE — Interval H&P Note (Signed)
History and Physical Interval Note:  03/19/2015 12:29 PM  [name]  has presented today for surgery, with the diagnosis of OA LEFT KNEE  The various methods of treatment have been discussed with the patient and family. After consideration of risks, benefits and other options for treatment, the patient has consented to  Procedure(s): LEFT TOTAL KNEE ARTHROPLASTY (Left) as a surgical intervention .  The patient's history has been reviewed, patient examined, no change in status, stable for surgery.  I have reviewed the patient's chart and labs.  Questions were answered to the patient's satisfaction.     Iren Whipp A

## 2015-03-19 NOTE — Brief Op Note (Signed)
03/19/2015  3:14 PM  PATIENT:  [name]  81 y.o. female  PRE-OPERATIVE DIAGNOSIS:Primary  OA LEFT KNEE and Morbid Obesity  POST-OPERATIVE DIAGNOSIS:Primary  OA LEFT KNEE and Morbid Obesity  PROCEDURE:  Procedure(s): LEFT TOTAL KNEE ARTHROPLASTY (Left)  SURGEON:  Surgeon(s) and Role:    * Latanya Maudlin, MD - Primary  PHYSICIAN ASSISTANT: Ardeen Jourdain PA  ASSISTANTS:Amber [place] PA  ANESTHESIA:   general  EBL:  Total I/O In: -  Out: 300 [Urine:300]  BLOOD ADMINISTERED:none  DRAINS: (one) Hemovact drain(s) in the Left Knee with  Suction Open   LOCAL MEDICATIONS USED:  MARCAINE 20cc of 0.25% plain and Exparel 20cc mixed with 20cc of Normal Saline.     SPECIMEN:  No Specimen  DISPOSITION OF SPECIMEN:  N/A  COUNTS:  YES  TOURNIQUET:  * Missing tourniquet times found for documented tourniquets in log:  [id number] *  DICTATION: .Other Dictation: Dictation Number [id number]  PLAN OF CARE: Admit to inpatient   PATIENT DISPOSITION:  Stable in OR   Delay start of Pharmacological VTE agent (>24hrs) due to surgical blood loss or risk of bleeding: yes

## 2015-03-19 NOTE — Transfer of Care (Signed)
Immediate Anesthesia Transfer of Care Note  Patient: [name]  Procedure(s) Performed: Procedure(s): LEFT TOTAL KNEE ARTHROPLASTY (Left)  Patient Location: PACU  Anesthesia Type:General  Level of Consciousness: awake, alert , oriented and patient cooperative  Airway & Oxygen Therapy: Patient Spontanous Breathing and Patient connected to face mask oxygen  Post-op Assessment: Report given to RN, Post -op Vital signs reviewed and stable and Patient moving all extremities X 4  Post vital signs: stable  Last Vitals:  Filed Vitals:   03/19/15 0937  BP: 135/65  Pulse: 77  Temp: 36.3 C  Resp: 16    Complications: No apparent anesthesia complications

## 2015-03-19 NOTE — Progress Notes (Signed)
Patient has +4 edema of bilateral lower extremities with redness of right lower extremity. Dr Gladstone Lighter informed

## 2015-03-19 NOTE — Progress Notes (Signed)
Dr. Delma Post in - made aware of patient's SA02s- general condition- O.K. To go to Telemetry

## 2015-03-19 NOTE — Anesthesia Preprocedure Evaluation (Addendum)
Anesthesia Evaluation  Patient identified by MRN, date of birth, ID band Patient awake  General Assessment Comment:Past Medical History Diagnosis Date . Anemia 2008 . DM (diabetes mellitus) 1999 . Cataract    bilaterla  . Permanent atrial fibrillation  . CAD (coronary artery disease)  . CRI (chronic renal insufficiency) 2008 . Hypertension  . Hyperlipemia  . GERD (gastroesophageal reflux disease)  . Arthritis  . Acute respiratory failure with hypoxia 06/12/2013   secondary to acute on chronic diastolic failure. . Wrist fracture, left 06/13/2013   03-12-15 no residual issues . Acute on chronic congestive heart failure with left ventricular diastolic dysfunction 25/42/7062 . Hx of CABG 10/24/2007   Dr. Roxan Hockey . S/P CABG x 4  . Pacemaker 10/24/2007   Medtronic EnRhythm; implanted for SSS, symptomatic bradycardia, PAF . Fracture November 2014   Left wrist-03-12-15 no issues now . Obesity  . Transfusion history  . Venous insufficiency    8-12-16left lower lateral outside healed leg ulcer-scabbed over.       Reviewed: Allergy & Precautions, NPO status , Patient's Chart, lab work & pertinent test results  Airway Mallampati: II  TM Distance: >3 FB Neck ROM: Full    Dental no notable dental hx.    Pulmonary former smoker,  breath sounds clear to auscultation  Pulmonary exam normal       Cardiovascular hypertension, Pt. on medications and Pt. on home beta blockers + CAD, + Peripheral Vascular Disease and +CHF Normal cardiovascular exam+ dysrhythmias Atrial Fibrillation + pacemaker Rhythm:Regular Rate:Normal  Clearance Dr. Jacinta Shoe. Cardiology office visits from 10-05-14, 10-28-14 and 01-18-15 were reviewed.  Cardiolite with EF 37%, recent ECHO showed better EF of 55-60%.  She is at elevated risk for cardiopulmonary complication.    Neuro/Psych negative neurological ROS  negative psych ROS   GI/Hepatic Neg liver ROS, GERD-  Medicated,  Endo/Other  diabetes, Type 1, Insulin DependentMorbid obesity  Renal/GU Renal disease  negative genitourinary   Musculoskeletal  (+) Arthritis -,   Abdominal (+) + obese,   Peds negative pediatric ROS (+)  Hematology  (+) anemia ,   Anesthesia Other Findings   Reproductive/Obstetrics negative OB ROS                          Anesthesia Physical Anesthesia Plan  ASA: III  Anesthesia Plan: General   Post-op Pain Management:    Induction: Intravenous  Airway Management Planned: Oral ETT  Additional Equipment:   Intra-op Plan:   Post-operative Plan: Extubation in OR  Informed Consent: I have reviewed the patients History and Physical, chart, labs and discussed the procedure including the risks, benefits and alternatives for the proposed anesthesia with the patient or authorized representative who has indicated his/her understanding and acceptance.   Dental advisory given  Plan Discussed with: CRNA  Anesthesia Plan Comments: (Off coumadin since 03-13-15. Last lovenox last night at 8 PM she thinks. Plan general.  She is at increased risk for cardiopulmonary complications including CHF exacerbation. She accepts this and wants to proceed.)       Anesthesia Quick Evaluation

## 2015-03-20 LAB — BASIC METABOLIC PANEL
ANION GAP: 7 (ref 5–15)
BUN: 30 mg/dL — ABNORMAL HIGH (ref 6–20)
CHLORIDE: 106 mmol/L (ref 101–111)
CO2: 27 mmol/L (ref 22–32)
Calcium: 8.1 mg/dL — ABNORMAL LOW (ref 8.9–10.3)
Creatinine, Ser: 1.4 mg/dL — ABNORMAL HIGH (ref 0.44–1.00)
GFR calc non Af Amer: 34 mL/min — ABNORMAL LOW (ref >60)
GFR, EST AFRICAN AMERICAN: 40 mL/min — AB (ref >60)
Glucose, Bld: 258 mg/dL — ABNORMAL HIGH (ref 65–99)
POTASSIUM: 4.2 mmol/L (ref 3.5–5.1)
SODIUM: 140 mmol/L (ref 135–145)

## 2015-03-20 LAB — PROTIME-INR
INR: 1.2 (ref 0.00–1.49)
PROTHROMBIN TIME: 15.4 s — AB (ref 11.6–15.2)

## 2015-03-20 LAB — GLUCOSE, CAPILLARY
GLUCOSE-CAPILLARY: 219 mg/dL — AB (ref 65–99)
GLUCOSE-CAPILLARY: 202 mg/dL — AB (ref 65–99)
GLUCOSE-CAPILLARY: 170 mg/dL — AB (ref 65–99)
GLUCOSE-CAPILLARY: 178 mg/dL — AB (ref 65–99)

## 2015-03-20 LAB — CBC
HCT: 36 % (ref 36.0–46.0)
HEMOGLOBIN: 10.7 g/dL — AB (ref 12.0–15.0)
MCH: 25.9 pg — AB (ref 26.0–34.0)
MCHC: 29.7 g/dL — ABNORMAL LOW (ref 30.0–36.0)
MCV: 87.2 fL (ref 78.0–100.0)
Platelets: 174 10*3/uL (ref 150–400)
RBC: 4.13 MIL/uL (ref 3.87–5.11)
RDW: 15.5 % (ref 11.5–15.5)
WBC: 10.2 10*3/uL (ref 4.0–10.5)

## 2015-03-20 MED ORDER — WARFARIN SODIUM 5 MG PO TABS
7.5000 mg | ORAL_TABLET | Freq: Once | ORAL | Status: AC
  Administered 2015-03-20: 7.5 mg via ORAL
  Filled 2015-03-20 (×2): qty 1

## 2015-03-20 NOTE — Op Note (Signed)
[name], [name]              ACCOUNT NO.:  [id number]  MEDICAL RECORD NO.:  [id number]  LOCATION:  4132                         FACILITY:  [facility]  PHYSICIAN:  Kipp Brood. Sanaya Gwilliam, M.D.DATE OF BIRTH:  Dec 13, 1932  DATE OF PROCEDURE:  03/19/2015 DATE OF DISCHARGE:                              OPERATIVE REPORT   SURGEON:  Kipp Brood. Gladstone Lighter, M.D.  ASSISTANT:  Ardeen Jourdain, P.A.  PREOPERATIVE DIAGNOSES: 1. Morbid obesity. 2. Severe bone-on-bone primary osteoarthritis with a medial tibial     plateau fragment fractured off the plateau.  POSTOPERATIVE DIAGNOSES: 1. Morbid obesity. 2. Severe bone-on-bone primary osteoarthritis with a medial tibial     plateau fragment fractured off the plateau.  OPERATIONS:  Left total knee arthroplasty utilizing the DePuy system.  I cemented all 3 components.  Gentamicin was used in the cement.  The sizes used: 1. Size 3 left femoral component posterior cruciate sacrificing type. 2. 38 mm patella with 3 pegs. 3. Size 3 tibial tray, revision type tray. 4. An insert was a size 3, 17.5 mm thickness rotating platform insert.  DESCRIPTION OF PROCEDURE:  Under general anesthesia, routine orthopedic prepping and draping of the left lower extremity was carried out.  The appropriate time-out was first carried out.  The left leg also was marked in the holding area.  The leg was exsanguinated and Esmarch tourniquet was elevated to 350 mmHg.  Leg then was placed in the [facility] knee holder and flexed.  An anterior approach to the knee was carried out after we exsanguinated the leg and inflated the tourniquet to 350 mmHg.  Two flaps were created.  Note, she was very obese, a great deal of time was taken to get down through the subcutaneous tissue down to the tendon.  We finally identified the quadriceps mechanism of the tendon and a median parapatellar incision was made after retractors were inserted.  I then reflected the patella laterally.  I flexed the  knee and did medial and lateral meniscectomies and excised the anterior and posterior cruciate ligaments.  At this particular point, all large spurs were removed.  We elected to leave that fragment medially alone, such that the medial collateral ligament was attached and it was out of harm's way.  A drill hole was made in the intercondylar notch.  A 12 mm thickness was removed from the distal femur.  I then measured the femur to be a size 3.  We did anterior-posterior chamfering cuts for a size 3 left femur.  Following that, tibia was prepared in the usual fashion for a revision tray.  The appropriate drill hole was made in the tibial plateau.  At this time, we then removed 6 mm thickness off the affected using the medial side as a guide.  I realized that we were going to use a large spacer and that was fine, she is 81 years old.  We are able to make a nice cut across that plateau without interfering with a large medial fragment attached to the medial collateral ligament.  At this particular point, we then inserted our lamina spreaders, removed posterior spurs from the femur, and completed our debridement.  We then measured the gap to  be a size 17.5 with the spacer blocks.  We then went on and continued to prepare the tibia for a revision tray, size 3.  We then cut our notch cut out distal femur.  We inserted our trial components.  We had excellent function with a 17.5 mm thickness insert. We then did a resurfacing procedure on the patella for a size 38 patella.  Three drill holes were made in the patella.  Removed all trial components, thoroughly water picked out the knee, cemented all 3 components in simultaneously with gentamicin in the cement.  Once the cement was hardened, we then removed all loose pieces of cement.  We water picked out the knee as well to make sure the cement fragments were removed.  We then injected 20 mL of 0.25% plain Marcaine into the soft tissue structures.   Some thrombin-soaked Gelfoam was inserted posteriorly.  I then went on and inserted my rotating platform size 3 tibial tray 17.5 mm thickness, reduced the knee.  We had excellent flexion and extension.  We had good medial lateral stability as well. We then inserted Hemovac drain and closed the knee in layers in the usual fashion.  Sterile dressings were applied.  She had 1 g of vancomycin preop since she was allergic to penicillin and tranexamic acid was used topically at the end of the procedure.  We also used Exparel 20 mL mixed with 20 mL of saline at the end of the procedure before the wound was closed.          ______________________________ Kipp Brood Gladstone Lighter, M.D.     RAG/MEDQ  D:  03/19/2015  T:  03/20/2015  Job:  [id number]

## 2015-03-20 NOTE — Progress Notes (Signed)
     Subjective: 1 Day Post-Op Procedure(s) (LRB): LEFT TOTAL KNEE ARTHROPLASTY (Left)   Patient reports pain as mild, pain controlled well.  No events throughout the night.   Objective:   VITALS:   Filed Vitals:   03/20/15 0523  BP: 151/61  Pulse: 77  Temp: 97.5 F (36.4 C)  Resp: 19    Dorsiflexion/Plantar flexion intact Incision: dressing C/D/I No cellulitis present Compartment soft  LABS  Recent Labs  03/20/15 0506  HGB 10.7*  HCT 36.0  WBC 10.2  PLT 174     Recent Labs  03/20/15 0506  NA 140  K 4.2  BUN 30*  CREATININE 1.40*  GLUCOSE 258*     Assessment/Plan: 1 Day Post-Op Procedure(s) (LRB): LEFT TOTAL KNEE ARTHROPLASTY (Left) Foley cath d/c'ed HV drain d/c'ed Advance diet Up with therapy D/C IV fluids Discharge home eventually, when ready, possibly Sunday     West Pugh. Yannick Steuber   PAC  03/20/2015, 9:18 AM

## 2015-03-20 NOTE — Evaluation (Signed)
Physical Therapy Evaluation Patient Details Name: [name] MRN: [id number] DOB: 17-Jul-1933 Today's Date: 03/20/2015   History of Present Illness  81 yo female s/p L TKA 03/19/15. Hx of DM, morbid obesity, A fib, bradycardia, PVD, CHF.   Clinical Impression  On eval, pt required Min assist +2 for mobility-walked ~10 feet with RW. Pain rated 4/10 with activity. Recommend SNF for continued rehab.     Follow Up Recommendations SNF    Equipment Recommendations  None recommended by PT    Recommendations for Other Services OT consult     Precautions / Restrictions Precautions Precautions: Fall;Knee Required Braces or Orthoses: Knee Immobilizer - Left Knee Immobilizer - Left: Discontinue once straight leg raise with < 10 degree lag Restrictions Weight Bearing Restrictions: No LLE Weight Bearing: Weight bearing as tolerated      Mobility  Bed Mobility Overal bed mobility: Needs Assistance Bed Mobility: Supine to Sit     Supine to sit: +2 for physical assistance;+2 for safety/equipment;HOB elevated;Min assist     General bed mobility comments: assist for LLE and to scoot forward on bed; used rails and HOB raised.  Cues for sequence  Transfers Overall transfer level: Needs assistance Equipment used: Rolling walker (2 wheeled) Transfers: Sit to/from Stand Sit to Stand: Min assist;+2 physical assistance;+2 safety/equipment;From elevated surface         General transfer comment: cues for UE/LE placement; assist to rise and steady  Ambulation/Gait Ambulation/Gait assistance: Min assist;+2 physical assistance;+2 safety/equipment Ambulation Distance (Feet): 10 Feet Assistive device: Rolling walker (2 wheeled) Gait Pattern/deviations: Step-to pattern;Trunk flexed;Antalgic     General Gait Details: Assist to stabilize and maneuver safely with walker. VCS safety, technique, sequence. Fatigues easily.   Stairs            Wheelchair Mobility    Modified Rankin  (Stroke Patients Only)       Balance Overall balance assessment: Needs assistance         Standing balance support: Bilateral upper extremity supported;During functional activity Standing balance-Leahy Scale: Poor                               Pertinent Vitals/Pain Pain Assessment: 0-10 Pain Score: 4  Pain Location: L knee Pain Descriptors / Indicators: Aching;Sore Pain Intervention(s): Monitored during session;Ice applied;Repositioned    Home Living Family/patient expects to be discharged to:: Skilled nursing facility                 Additional Comments: pt is from [facility] and plans to return there    Prior Function Level of Independence: Independent with assistive device(s)         Comments: for adls/toilet transfers     Hand Dominance        Extremity/Trunk Assessment   Upper Extremity Assessment: Defer to OT evaluation           Lower Extremity Assessment: LLE deficits/detail   LLE Deficits / Details: moves ankle well. At least 2/5 throughout-limited by pain  Cervical / Trunk Assessment: Kyphotic  Communication   Communication: HOH  Cognition Arousal/Alertness: Awake/alert Behavior During Therapy: WFL for tasks assessed/performed Overall Cognitive Status: Within Functional Limits for tasks assessed                      General Comments      Exercises Total Joint Exercises Ankle Circles/Pumps: AROM;Both;10 reps;Supine Quad Sets: AROM;Both;10 reps;Supine Heel Slides: AAROM;Left;10 reps;Supine  Straight Leg Raises: AAROM;Left;10 reps;Supine Goniometric ROM: ~10-40 degrees      Assessment/Plan    PT Assessment Patient needs continued PT services  PT Diagnosis Difficulty walking;Acute pain   PT Problem List Decreased strength;Decreased range of motion;Decreased activity tolerance;Decreased balance;Obesity;Decreased safety awareness;Decreased knowledge of precautions;Decreased knowledge of use of DME;Pain  PT  Treatment Interventions DME instruction;Gait training;Functional mobility training;Therapeutic activities;Patient/family education;Balance training;Therapeutic exercise   PT Goals (Current goals can be found in the Care Plan section) Acute Rehab PT Goals Patient Stated Goal: get back to being independent PT Goal Formulation: With patient Time For Goal Achievement: 03/27/15 Potential to Achieve Goals: Good    Frequency 7X/week   Barriers to discharge        Co-evaluation   Reason for Co-Treatment: For patient/therapist safety PT goals addressed during session: Mobility/safety with mobility OT goals addressed during session: ADL's and self-care       End of Session Equipment Utilized During Treatment: Left knee immobilizer Activity Tolerance: Patient limited by fatigue;Patient limited by pain Patient left: in chair;with call bell/phone within reach;with chair alarm set           Time: 0488-8916 PT Time Calculation (min) (ACUTE ONLY): 24 min   Charges:   PT Evaluation $Initial PT Evaluation Tier I: 1 Procedure     PT G Codes:        Weston Anna, MPT Pager: [phone]

## 2015-03-20 NOTE — Progress Notes (Signed)
ANTICOAGULATION CONSULT NOTE - follow up  Pharmacy Consult for Warfarin Indication: atrial fibrillation, chronic Warfarin PTA resumed s/p L TKA  Allergies  Allergen Reactions  . Penicillins Other (See Comments)    Pt. Not sure of reaction.   Patient Measurements: Height: 5' (152.4 cm) Weight: 237 lb 3.2 oz (107.593 kg) IBW/kg (Calculated) : 45.5  Vital Signs: Temp: 97.5 F (36.4 C) (08/20 0523) Temp Source: Oral (08/20 0523) BP: 151/61 mmHg (08/20 0523) Pulse Rate: 77 (08/20 0523)  Labs:  Recent Labs  03/19/15 1050 03/20/15 0506  HGB  --  10.7*  HCT  --  36.0  PLT  --  174  LABPROT 14.6 15.4*  INR 1.12 1.20  CREATININE  --  1.40*   Estimated Creatinine Clearance: 35.6 mL/min (by C-G formula based on Cr of 1.4).  Medical History: Past Medical History  Diagnosis Date  . Anemia 2008  . DM (diabetes mellitus) 1999  . Cataract     bilaterla   . Permanent atrial fibrillation   . CAD (coronary artery disease)   . CRI (chronic renal insufficiency) 2008  . Hypertension   . Hyperlipemia   . GERD (gastroesophageal reflux disease)   . Arthritis   . Acute respiratory failure with hypoxia 06/12/2013    secondary to acute on chronic diastolic failure.  . Wrist fracture, left 06/13/2013    03-12-15 no residual issues  . Acute on chronic congestive heart failure with left ventricular diastolic dysfunction 50/93/2671  . Hx of CABG 10/24/2007    Dr. Roxan Hockey  . S/P CABG x 4   . Pacemaker 10/24/2007    Medtronic EnRhythm; implanted for SSS, symptomatic bradycardia, PAF  . Fracture November 2014    Left wrist-03-12-15 no issues now  . Obesity   . Transfusion history   . Venous insufficiency     8-12-16left lower lateral outside healed leg ulcer-scabbed over.   Medications:  Scheduled:  . citalopram  20 mg Oral q morning - 10a  . ferrous sulfate  325 mg Oral TID PC  . furosemide  80 mg Oral TID  . insulin aspart  0-15 Units Subcutaneous TID WC  . levothyroxine  125  mcg Oral QAC breakfast  . metoprolol tartrate  25 mg Oral BID  . pravastatin  40 mg Oral QPM  . Warfarin - Pharmacist Dosing Inpatient   Does not apply q1800   Assessment: 66 yoF s/p L TKA, on chronic Warfarin 5mg  daily for Afib, last dose 8/13. Lovenox bridging from 8/14 of 100mg  bid with last dose 8/18 pm.   Today, 03/20/2015  INR subtherapeutic as expected POD1 and first dose of warfarin 5mg  yesterday  Hgb stable  No reported bleeding  Goal of Therapy:  INR 2-3 Monitor platelets by anticoagulation protocol: Yes   Plan:  1) Warfarin 7.5mg  tonight 2) Daily INR   Adrian Saran, PharmD, BCPS Pager [phone] 03/20/2015 9:58 AM

## 2015-03-20 NOTE — Evaluation (Signed)
Occupational Therapy Evaluation Patient Details Name: [name] MRN: [id number] DOB: 1932/11/17 Today's Date: 03/20/2015    History of Present Illness s/p L TKA   Clinical Impression   This 81 year old female was admitted for the above surgery.  At baseline, she is mod I at her skilled facility.  Pt currently needs +2 for safety (min) for transfers and +2 for safety (mod to max) for LB adls.  Focus of OT in acute will be on toileting with min guard level goals and standing for grooming at supervision level    Follow Up Recommendations  SNF    Equipment Recommendations  None recommended by OT (has bsc)    Recommendations for Other Services       Precautions / Restrictions Precautions Precautions: Knee;Fall Required Braces or Orthoses: Knee Immobilizer - Left Restrictions LLE Weight Bearing: Weight bearing as tolerated      Mobility Bed Mobility Overal bed mobility: + 2 for safety/equipment;Needs Assistance Bed Mobility: Supine to Sit     Supine to sit: Min assist;+2 for safety/equipment     General bed mobility comments: assist for LLE and to scoot forward on bed; used rails and HOB raised.  Cues for sequence.  Pt needed mod A to scoot forward to EOB  Transfers Overall transfer level: Needs assistance Equipment used: Rolling walker (2 wheeled) Transfers: Sit to/from Stand Sit to Stand: Min assist;+2 safety/equipment;From elevated surface         General transfer comment: cues for UE/LE placement; assist to rise and steady    Balance                                            ADL Overall ADL's : Needs assistance/impaired     Grooming: Set up;Sitting   Upper Body Bathing: Set up;Sitting   Lower Body Bathing: Moderate assistance;+2 for physical assistance;Sit to/from stand (min +2 to stand)   Upper Body Dressing : Set up;Sitting   Lower Body Dressing: Maximal assistance;+2 for safety/equipment (min +2 to stand)   Toilet  Transfer: Minimal assistance;+2 for safety/equipment;Ambulation             General ADL Comments: Pt initially nervous about standing/ambulating, but able to do so and pleased with herself.       Vision     Perception     Praxis      Pertinent Vitals/Pain Pain Assessment: 0-10 Pain Score: 4  Pain Location: L knee Pain Descriptors / Indicators: Aching Pain Intervention(s): Limited activity within patient's tolerance;Monitored during session;Premedicated before session;Repositioned;Ice applied     Hand Dominance     Extremity/Trunk Assessment Upper Extremity Assessment Upper Extremity Assessment: Overall WFL for tasks assessed           Communication Communication Communication: HOH   Cognition Arousal/Alertness: Awake/alert Behavior During Therapy: WFL for tasks assessed/performed Overall Cognitive Status: Within Functional Limits for tasks assessed                     General Comments       Exercises       Shoulder Instructions      Home Living Family/patient expects to be discharged to:: Skilled nursing facility  Additional Comments: pt is from [facility] and plans to return there      Prior Functioning/Environment Level of Independence: Independent with assistive device(s)        Comments: for adls/toilet transfers    OT Diagnosis: Generalized weakness   OT Problem List: Decreased strength;Decreased activity tolerance;Decreased knowledge of use of DME or AE;Pain   OT Treatment/Interventions: Self-care/ADL training;DME and/or AE instruction;Patient/family education    OT Goals(Current goals can be found in the care plan section) Acute Rehab OT Goals Patient Stated Goal: get back to being independent OT Goal Formulation: With patient Time For Goal Achievement: 03/27/15 Potential to Achieve Goals: Good ADL Goals Pt Will Perform Grooming: with supervision;standing Pt Will Transfer to  Toilet: with min guard assist;ambulating;bedside commode Pt Will Perform Toileting - Clothing Manipulation and hygiene: with min guard assist;sit to/from stand  OT Frequency: Min 2X/week   Barriers to D/C:            Co-evaluation PT/OT/SLP Co-Evaluation/Treatment: Yes Reason for Co-Treatment: For patient/therapist safety PT goals addressed during session: Mobility/safety with mobility OT goals addressed during session: ADL's and self-care      End of Session Nurse Communication: Mobility status  Activity Tolerance: Patient tolerated treatment well Patient left: in chair;with call bell/phone within reach;with chair alarm set   Time: 9458-5929 OT Time Calculation (min): 23 min Charges:  OT General Charges $OT Visit: 1 Procedure OT Evaluation $Initial OT Evaluation Tier I: 1 Procedure G-Codes:    [name] Mar 30, 2015, 12:27 PM  [name], OTR/L [phone] 2015/03/30

## 2015-03-21 LAB — PROTIME-INR
INR: 1.44 (ref 0.00–1.49)
PROTHROMBIN TIME: 17.6 s — AB (ref 11.6–15.2)

## 2015-03-21 LAB — CBC
HCT: 32.7 % — ABNORMAL LOW (ref 36.0–46.0)
Hemoglobin: 9.6 g/dL — ABNORMAL LOW (ref 12.0–15.0)
MCH: 25.7 pg — AB (ref 26.0–34.0)
MCHC: 29.4 g/dL — AB (ref 30.0–36.0)
MCV: 87.7 fL (ref 78.0–100.0)
PLATELETS: 147 10*3/uL — AB (ref 150–400)
RBC: 3.73 MIL/uL — ABNORMAL LOW (ref 3.87–5.11)
RDW: 15.5 % (ref 11.5–15.5)
WBC: 9.9 10*3/uL (ref 4.0–10.5)

## 2015-03-21 LAB — BASIC METABOLIC PANEL
Anion gap: 6 (ref 5–15)
BUN: 38 mg/dL — AB (ref 6–20)
CO2: 27 mmol/L (ref 22–32)
CREATININE: 1.7 mg/dL — AB (ref 0.44–1.00)
Calcium: 8.5 mg/dL — ABNORMAL LOW (ref 8.9–10.3)
Chloride: 105 mmol/L (ref 101–111)
GFR calc Af Amer: 32 mL/min — ABNORMAL LOW (ref >60)
GFR, EST NON AFRICAN AMERICAN: 27 mL/min — AB (ref >60)
GLUCOSE: 218 mg/dL — AB (ref 65–99)
Potassium: 4.2 mmol/L (ref 3.5–5.1)
SODIUM: 138 mmol/L (ref 135–145)

## 2015-03-21 LAB — URINE CULTURE

## 2015-03-21 LAB — GLUCOSE, CAPILLARY
Glucose-Capillary: 201 mg/dL — ABNORMAL HIGH (ref 65–99)
Glucose-Capillary: 167 mg/dL — ABNORMAL HIGH (ref 65–99)

## 2015-03-21 MED ORDER — OXYCODONE-ACETAMINOPHEN 5-325 MG PO TABS: 1.0000 | ORAL_TABLET | ORAL | Status: DC | PRN

## 2015-03-21 MED ORDER — ENOXAPARIN SODIUM 40 MG/0.4ML ~~LOC~~ SOLN
40.0000 mg | SUBCUTANEOUS | Status: AC
  Administered 2015-03-21: 40 mg via SUBCUTANEOUS
  Filled 2015-03-21: qty 0.4

## 2015-03-21 MED ORDER — WARFARIN SODIUM 5 MG PO TABS: 5.0000 mg | ORAL_TABLET | Freq: Once | ORAL | Status: DC

## 2015-03-21 MED ORDER — METHOCARBAMOL 500 MG PO TABS: 500.0000 mg | ORAL_TABLET | Freq: Four times a day (QID) | ORAL | Status: DC | PRN

## 2015-03-21 NOTE — Clinical Social Work Note (Signed)
Clinical Social Work Assessment  Patient Details  Name: MACLAINE AHOLA MRN: [id number] Date of Birth: 07/05/1933  Date of referral:  03/20/15               Reason for consult:  Facility Placement                Permission sought to share information with:  Facility Art therapist granted to share information::  Yes, Verbal Permission Granted  Name::        Agency::     Relationship::     Contact Information:     Housing/Transportation Living arrangements for the past 2 months:  [place] of Information:  Patient Patient Interpreter Needed:  None Criminal Activity/Legal Involvement Pertinent to Current Situation/Hospitalization:    Significant Relationships:  Adult Children Lives with:    Do you feel safe going back to the place where you live?  Yes Need for family participation in patient care:  No (Coment)  Care giving concerns:  No caregiver   Facilities manager / plan:  CSW met with pt at bedside to assess for discharge services.  CSW prompted pt to discuss her history and needs.  CSW provided explanation of role and encouraged pt to ask questions and explore her thoughts and feelings related to hospital stay and discharge needs.  CSW will fax pt information to [facility].  Employment status:  Retired Forensic scientist:    PT Recommendations:  [place] / Referral to community resources:     Patient/Family's Response to care:  Pt found it hard to stay awake through interview process and stated that she was very tired.  Pt stated that she has been living at [facility] for the past 2 years and plans on returning at discharge.  Pt declined any family involvement at this time.  Patient/Family's Understanding of and Emotional Response to Diagnosis, Current Treatment, and Prognosis:  Pt not awake enough to assess  Emotional Assessment Appearance:    Attitude/Demeanor/Rapport:  Lethargic Affect  (typically observed):  Accepting Orientation:  Oriented to Self, Oriented to Place, Oriented to  Time, Oriented to Situation Alcohol / Substance use:    Psych involvement (Current and /or in the community):     Discharge Needs  Concerns to be addressed:    Readmission within the last 30 days:    Current discharge risk:    Barriers to Discharge:  No Barriers Identified   Carlean Jews, LCSW 03/21/2015, 8:55 AM

## 2015-03-21 NOTE — Progress Notes (Signed)
Physical Therapy Treatment Patient Details Name: [name] MRN: [id number] DOB: 1933-03-20 Today's Date: 03/21/2015    History of Present Illness 81 yo female s/p L TKA 03/19/15. Hx of DM, morbid obesity, A fib, bradycardia, PVD, CHF.     PT Comments    +2 assist for sit to stand. Pt able to stand with RW for 2 minutes for pericare following toileting. She walked 50' with RW and min A, distance limited by dizziness. SaO2 83% on RA with walking, BP 105/80 in sitting after walking, RN notified. Performed L TKA exercises with assist.   Follow Up Recommendations  SNF     Equipment Recommendations  None recommended by PT    Recommendations for Other Services OT consult     Precautions / Restrictions Precautions Precautions: Fall;Knee Required Braces or Orthoses: Knee Immobilizer - Left Knee Immobilizer - Left: Discontinue once straight leg raise with < 10 degree lag Restrictions Weight Bearing Restrictions: No LLE Weight Bearing: Weight bearing as tolerated    Mobility  Bed Mobility               General bed mobility comments: NT-up in recliner  Transfers Overall transfer level: Needs assistance Equipment used: Rolling walker (2 wheeled) Transfers: Sit to/from Stand Sit to Stand: +2 physical assistance;+2 safety/equipment;Mod assist         General transfer comment: cues for UE/LE placement; assist to rise and steady  Ambulation/Gait Ambulation/Gait assistance: Min assist;+2 safety/equipment Ambulation Distance (Feet): 12 Feet Assistive device: Rolling walker (2 wheeled) Gait Pattern/deviations: Step-to pattern;Trunk flexed;Decreased stride length;Antalgic Gait velocity: decr Gait velocity interpretation: Below normal speed for age/gender General Gait Details: Assist to stabilize and maneuver safely with walker. VCS safety, technique, sequence. Fatigues easily. SaO2 83% on RA. Pt reported dizziness with walking, BP sitting 105/80.  RN notified of vitals.     Stairs            Wheelchair Mobility    Modified Rankin (Stroke Patients Only)       Balance             Standing balance-Leahy Scale: Poor                      Cognition Arousal/Alertness: Awake/alert Behavior During Therapy: WFL for tasks assessed/performed Overall Cognitive Status: Within Functional Limits for tasks assessed                      Exercises Total Joint Exercises Ankle Circles/Pumps: AROM;Both;10 reps;Supine Quad Sets: AROM;Both;10 reps;Supine Short Arc Quad: AAROM;Left;10 reps;Supine Heel Slides: AAROM;Left;10 reps;Supine Hip ABduction/ADduction: AAROM;Left;10 reps;Supine Straight Leg Raises: AAROM;Left;10 reps;Supine Goniometric ROM: 10-40 degrees AAROM L knee    General Comments        Pertinent Vitals/Pain Pain Score: 0-No pain Pain Intervention(s): Monitored during session;Premedicated before session    Home Living                      Prior Function            PT Goals (current goals can now be found in the care plan section) Acute Rehab PT Goals Patient Stated Goal: get back to being independent PT Goal Formulation: With patient Time For Goal Achievement: 03/27/15 Potential to Achieve Goals: Good Progress towards PT goals: Progressing toward goals    Frequency  7X/week    PT Plan Current plan remains appropriate    Co-evaluation  End of Session Equipment Utilized During Treatment: Left knee immobilizer Activity Tolerance: Patient limited by fatigue;Patient limited by pain;Treatment limited secondary to medical complications (Comment) (hypoxia, dizziness with walking) Patient left: in chair;with call bell/phone within reach;with chair alarm set     Time: 1011-1041 PT Time Calculation (min) (ACUTE ONLY): 30 min  Charges:  $Gait Training: 8-22 mins $Therapeutic Exercise: 8-22 mins                    G Codes:      Philomena Doheny 03/21/2015, 11:44  AM [phone]

## 2015-03-21 NOTE — Clinical Social Work Note (Signed)
CSW was paged for pt discharge today  CSW reviewed shadow chart where FL 2 was left for MD signature. No signature  CSW reviewed discharge summary that reflected fentanal and oxycodone medication for discharge.    CSW reviewed shadow chart, no prescriptions for the above.  CSW called and left message at [facility].  Waiting for return call  .Dede Query, LCSW [facility] Clinical Social Worker - Weekend Coverage cell #: [phone]

## 2015-03-21 NOTE — Progress Notes (Signed)
Subjective: 2 Days Post-Op Procedure(s) (LRB): LEFT TOTAL KNEE ARTHROPLASTY (Left) Patient reports pain as 3 on 0-10 scale. Patient urinated on herself. Aces removed and leg washed. Will have nurse Change her bed.BUN and elevated and was also Pre-Op. Ready for transfer to SNF.   Objective: Vital signs in last 24 hours: Temp:  [97.9 F (36.6 C)-98.7 F (37.1 C)] 98.2 F (36.8 C) (08/21 0502) Pulse Rate:  [63-87] 63 (08/21 0502) Resp:  [17-18] 17 (08/21 0502) BP: (104-136)/(52-75) 136/54 mmHg (08/21 0502) SpO2:  [95 %-96 %] 96 % (08/21 0502)  Intake/Output from previous day: 08/20 0701 - 08/21 0700 In: 1070 [P.O.:120; I.V.:950] Out: 500 [Urine:500] Intake/Output this shift:     Recent Labs  03/20/15 0506 03/21/15 0530  HGB 10.7* 9.6*    Recent Labs  03/20/15 0506 03/21/15 0530  WBC 10.2 9.9  RBC 4.13 3.73*  HCT 36.0 32.7*  PLT 174 147*    Recent Labs  03/20/15 0506 03/21/15 0530  NA 140 138  K 4.2 4.2  CL 106 105  CO2 27 27  BUN 30* 38*  CREATININE 1.40* 1.70*  GLUCOSE 258* 218*  CALCIUM 8.1* 8.5*    Recent Labs  03/20/15 0506 03/21/15 0530  INR 1.20 1.44    Neurologically intact No cellulitis present Compartment soft  Assessment/Plan: 2 Days Post-Op Procedure(s) (LRB): LEFT TOTAL KNEE ARTHROPLASTY (Left) Up with therapy Discharge to SNF  Charla Criscione A 03/21/2015, 8:28 AM

## 2015-03-21 NOTE — Clinical Social Work Note (Signed)
CSW received  Call from Rn that pt was ready to be discharged back to her SNF  CSW called and spoke with [facility] nursing, faxed discharge summary, prepared packet and provided to RN  CSW met with pt at bedside to let her know she was ready for discharge.  She wanted ambulance transport to SNF  CSW called [facility] for pt transport back to SNF  .Dede Query, LCSW [facility] Clinical Social Worker - Weekend Coverage cell #: [phone]

## 2015-03-21 NOTE — Discharge Summary (Addendum)
Physician Discharge Summary   Patient ID: [name] MRN: [id number] DOB/AGE: 1933-04-09 81 y.o.  Admit date: 03/19/2015 Discharge date:   Primary Diagnosis: Primary osteoarthritis, left knee   Admission Diagnoses:  Past Medical History  Diagnosis Date  . Anemia 2008  . DM (diabetes mellitus) 1999  . Cataract     bilaterla   . Permanent atrial fibrillation   . CAD (coronary artery disease)   . CRI (chronic renal insufficiency) 2008  . Hypertension   . Hyperlipemia   . GERD (gastroesophageal reflux disease)   . Arthritis   . Acute respiratory failure with hypoxia 06/12/2013    secondary to acute on chronic diastolic failure.  . Wrist fracture, left 06/13/2013    03-12-15 no residual issues  . Acute on chronic congestive heart failure with left ventricular diastolic dysfunction 35/46/5681  . Hx of CABG 10/24/2007    Dr. Roxan Hockey  . S/P CABG x 4   . Pacemaker 10/24/2007    [employer] EnRhythm; implanted for SSS, symptomatic bradycardia, PAF  . Fracture November 2014    Left wrist-03-12-15 no issues now  . Obesity   . Transfusion history   . Venous insufficiency     8-12-16left lower lateral outside healed leg ulcer-scabbed over.   Discharge Diagnoses:   Active Problems:   History of total knee arthroplasty  Estimated body mass index is 46.32 kg/(m^2) as calculated from the following:   Height as of this encounter: 5' (1.524 m).   Weight as of this encounter: 107.593 kg (237 lb 3.2 oz).  Procedure:  Procedure(s) (LRB): LEFT TOTAL KNEE ARTHROPLASTY (Left)   Consults: None  HPI: [name], 81 y.o. female, has a history of pain and functional disability in the left knee due to arthritis and has failed non-surgical conservative treatments for greater than 12 weeks to includeNSAID's and/or analgesics, corticosteriod injections, flexibility and strengthening excercises, use of assistive devices and activity modification. Onset of symptoms was gradual, starting  5 years ago with gradually worsening course since that time. The patient noted no past surgery on the left knee(s). Patient currently rates pain in the left knee(s) at 8 out of 10 with activity. Patient has night pain, worsening of pain with activity and weight bearing, pain that interferes with activities of daily living, pain with passive range of motion, crepitus and joint swelling. Patient has evidence of periarticular osteophytes, joint subluxation and joint space narrowing by imaging studies. There is no active infection.  Laboratory Data: Admission on 03/19/2015  Component Date Value Ref Range Status  . Prothrombin Time 03/19/2015 14.6  11.6 - 15.2 seconds Final  . INR 03/19/2015 1.12  0.00 - 1.49 Final  . Glucose-Capillary 03/19/2015 120* 65 - 99 mg/dL Final  . Specimen Description 03/19/2015 URINE, CATHETERIZED   Final  . Special Requests 03/19/2015 NONE   Final  . Culture 03/19/2015    Final                   Value:>=100,000 COLONIES/mL GRAM NEGATIVE RODS Performed at [facility]   . Report Status 03/19/2015 PENDING   Incomplete  . Glucose-Capillary 03/19/2015 180* 65 - 99 mg/dL Final  . Comment 1 03/19/2015 Document in Chart   Final  . Prothrombin Time 03/20/2015 15.4* 11.6 - 15.2 seconds Final  . INR 03/20/2015 1.20  0.00 - 1.49 Final  . WBC 03/20/2015 10.2  4.0 - 10.5 K/uL Final  . RBC 03/20/2015 4.13  3.87 - 5.11 MIL/uL Final  . Hemoglobin  03/20/2015 10.7* 12.0 - 15.0 g/dL Final  . HCT 03/20/2015 36.0  36.0 - 46.0 % Final  . MCV 03/20/2015 87.2  78.0 - 100.0 fL Final  . MCH 03/20/2015 25.9* 26.0 - 34.0 pg Final  . MCHC 03/20/2015 29.7* 30.0 - 36.0 g/dL Final  . RDW 03/20/2015 15.5  11.5 - 15.5 % Final  . Platelets 03/20/2015 174  150 - 400 K/uL Final  . Sodium 03/20/2015 140  135 - 145 mmol/L Final  . Potassium 03/20/2015 4.2  3.5 - 5.1 mmol/L Final  . Chloride 03/20/2015 106  101 - 111 mmol/L Final  . CO2 03/20/2015 27  22 - 32 mmol/L Final  . Glucose, Bld  03/20/2015 258* 65 - 99 mg/dL Final  . BUN 03/20/2015 30* 6 - 20 mg/dL Final  . Creatinine, Ser 03/20/2015 1.40* 0.44 - 1.00 mg/dL Final  . Calcium 03/20/2015 8.1* 8.9 - 10.3 mg/dL Final  . GFR calc non Af Amer 03/20/2015 34* >60 mL/min Final  . GFR calc Af Amer 03/20/2015 40* >60 mL/min Final   Comment: (NOTE) The eGFR has been calculated using the CKD EPI equation. This calculation has not been validated in all clinical situations. eGFR's persistently <60 mL/min signify possible Chronic Kidney Disease.   . Anion gap 03/20/2015 7  5 - 15 Final  . Glucose-Capillary 03/19/2015 232* 65 - 99 mg/dL Final  . Glucose-Capillary 03/20/2015 219* 65 - 99 mg/dL Final  . Glucose-Capillary 03/20/2015 202* 65 - 99 mg/dL Final  . Glucose-Capillary 03/20/2015 170* 65 - 99 mg/dL Final  . Prothrombin Time 03/21/2015 17.6* 11.6 - 15.2 seconds Final  . INR 03/21/2015 1.44  0.00 - 1.49 Final  . WBC 03/21/2015 9.9  4.0 - 10.5 K/uL Final  . RBC 03/21/2015 3.73* 3.87 - 5.11 MIL/uL Final  . Hemoglobin 03/21/2015 9.6* 12.0 - 15.0 g/dL Final  . HCT 03/21/2015 32.7* 36.0 - 46.0 % Final  . MCV 03/21/2015 87.7  78.0 - 100.0 fL Final  . MCH 03/21/2015 25.7* 26.0 - 34.0 pg Final  . MCHC 03/21/2015 29.4* 30.0 - 36.0 g/dL Final  . RDW 03/21/2015 15.5  11.5 - 15.5 % Final  . Platelets 03/21/2015 147* 150 - 400 K/uL Final  . Sodium 03/21/2015 138  135 - 145 mmol/L Final  . Potassium 03/21/2015 4.2  3.5 - 5.1 mmol/L Final  . Chloride 03/21/2015 105  101 - 111 mmol/L Final  . CO2 03/21/2015 27  22 - 32 mmol/L Final  . Glucose, Bld 03/21/2015 218* 65 - 99 mg/dL Final  . BUN 03/21/2015 38* 6 - 20 mg/dL Final  . Creatinine, Ser 03/21/2015 1.70* 0.44 - 1.00 mg/dL Final  . Calcium 03/21/2015 8.5* 8.9 - 10.3 mg/dL Final  . GFR calc non Af Amer 03/21/2015 27* >60 mL/min Final  . GFR calc Af Amer 03/21/2015 32* >60 mL/min Final   Comment: (NOTE) The eGFR has been calculated using the CKD EPI equation. This calculation has  not been validated in all clinical situations. eGFR's persistently <60 mL/min signify possible Chronic Kidney Disease.   . Anion gap 03/21/2015 6  5 - 15 Final  . Glucose-Capillary 03/20/2015 178* 65 - 99 mg/dL Final  . Glucose-Capillary 03/21/2015 201* 65 - 99 mg/dL Final  Hospital Outpatient Visit on 03/12/2015  Component Date Value Ref Range Status  . MRSA, PCR 03/12/2015 NEGATIVE  NEGATIVE Final  . Staphylococcus aureus 03/12/2015 NEGATIVE  NEGATIVE Final   Comment:        The Xpert SA Assay (  FDA approved for NASAL specimens in patients over 2 years of age), is one component of a comprehensive surveillance program.  Test performance has been validated by [facility] for patients greater than or equal to 52 year old. It is not intended to diagnose infection nor to guide or monitor treatment.   Marland Kitchen aPTT 03/12/2015 33  24 - 37 seconds Final  . WBC 03/12/2015 8.3  4.0 - 10.5 K/uL Final  . RBC 03/12/2015 4.79  3.87 - 5.11 MIL/uL Final  . Hemoglobin 03/12/2015 12.9  12.0 - 15.0 g/dL Final  . HCT 03/12/2015 41.2  36.0 - 46.0 % Final  . MCV 03/12/2015 86.0  78.0 - 100.0 fL Final  . MCH 03/12/2015 26.9  26.0 - 34.0 pg Final  . MCHC 03/12/2015 31.3  30.0 - 36.0 g/dL Final  . RDW 03/12/2015 15.6* 11.5 - 15.5 % Final  . Platelets 03/12/2015 225  150 - 400 K/uL Final  . Neutrophils Relative % 03/12/2015 68  43 - 77 % Final  . Neutro Abs 03/12/2015 5.6  1.7 - 7.7 K/uL Final  . Lymphocytes Relative 03/12/2015 22  12 - 46 % Final  . Lymphs Abs 03/12/2015 1.8  0.7 - 4.0 K/uL Final  . Monocytes Relative 03/12/2015 6  3 - 12 % Final  . Monocytes Absolute 03/12/2015 0.5  0.1 - 1.0 K/uL Final  . Eosinophils Relative 03/12/2015 4  0 - 5 % Final  . Eosinophils Absolute 03/12/2015 0.3  0.0 - 0.7 K/uL Final  . Basophils Relative 03/12/2015 0  0 - 1 % Final  . Basophils Absolute 03/12/2015 0.0  0.0 - 0.1 K/uL Final  . Sodium 03/12/2015 142  135 - 145 mmol/L Final  . Potassium 03/12/2015 4.1   3.5 - 5.1 mmol/L Final  . Chloride 03/12/2015 103  101 - 111 mmol/L Final  . CO2 03/12/2015 31  22 - 32 mmol/L Final  . Glucose, Bld 03/12/2015 137* 65 - 99 mg/dL Final  . BUN 03/12/2015 45* 6 - 20 mg/dL Final  . Creatinine, Ser 03/12/2015 1.36* 0.44 - 1.00 mg/dL Final  . Calcium 03/12/2015 9.3  8.9 - 10.3 mg/dL Final  . Total Protein 03/12/2015 7.6  6.5 - 8.1 g/dL Final  . Albumin 03/12/2015 3.6  3.5 - 5.0 g/dL Final  . AST 03/12/2015 23  15 - 41 U/L Final  . ALT 03/12/2015 15  14 - 54 U/L Final  . Alkaline Phosphatase 03/12/2015 83  38 - 126 U/L Final  . Total Bilirubin 03/12/2015 0.7  0.3 - 1.2 mg/dL Final  . GFR calc non Af Amer 03/12/2015 36* >60 mL/min Final  . GFR calc Af Amer 03/12/2015 41* >60 mL/min Final   Comment: (NOTE) The eGFR has been calculated using the CKD EPI equation. This calculation has not been validated in all clinical situations. eGFR's persistently <60 mL/min signify possible Chronic Kidney Disease.   . Anion gap 03/12/2015 8  5 - 15 Final  . Prothrombin Time 03/12/2015 20.6* 11.6 - 15.2 seconds Final  . INR 03/12/2015 1.78* 0.00 - 1.49 Final  . ABO/RH(D) 03/12/2015 O NEG   Final  . Antibody Screen 03/12/2015 NEG   Final  . Sample Expiration 03/12/2015 03/22/2015   Final  . Color, Urine 03/12/2015 YELLOW  YELLOW Final  . APPearance 03/12/2015 CLOUDY* CLEAR Final  . Specific Gravity, Urine 03/12/2015 1.014  1.005 - 1.030 Final  . pH 03/12/2015 6.0  5.0 - 8.0 Final  . Glucose, UA 03/12/2015 NEGATIVE  NEGATIVE mg/dL Final  . Hgb urine dipstick 03/12/2015 TRACE* NEGATIVE Final  . Bilirubin Urine 03/12/2015 NEGATIVE  NEGATIVE Final  . Ketones, ur 03/12/2015 NEGATIVE  NEGATIVE mg/dL Final  . Protein, ur 03/12/2015 NEGATIVE  NEGATIVE mg/dL Final  . Urobilinogen, UA 03/12/2015 0.2  0.0 - 1.0 mg/dL Final  . Nitrite 03/12/2015 NEGATIVE  NEGATIVE Final  . Leukocytes, UA 03/12/2015 SMALL* NEGATIVE Final  . ABO/RH(D) 03/12/2015 O NEG   Final  . Squamous  Epithelial / LPF 03/12/2015 FEW* RARE Final  . WBC, UA 03/12/2015 11-20  <3 WBC/hpf Final  . RBC / HPF 03/12/2015 0-2  <3 RBC/hpf Final  . Bacteria, UA 03/12/2015 MANY* RARE Final     X-Rays:Dg Chest 2 View  03/12/2015   CLINICAL DATA:  Preoperative respiratory evaluation prior to left total knee arthroplasty. Current history of coronary artery disease post CABG. Current history of atrial fibrillation and chronic diastolic congestive heart failure. Indwelling pacemaker. Current history of cardiomyopathy.  EXAM: CHEST  2 VIEW  COMPARISON:  05/01/2014 and earlier.  FINDINGS: Prior sternotomy for CABG. Cardiac silhouette markedly enlarged, unchanged dating back to 2014. Left subclavian dual lead transvenous pacemaker with the lead tips at the expected location of the right atrial appendage and right ventricular apex, unchanged. Thoracic aorta atherosclerotic, unchanged. Hilar and mediastinal contours otherwise unremarkable. Pulmonary venous hypertension and mild diffuse interstitial pulmonary edema, similar to prior examinations. No confluent airspace consolidation. No visible pleural effusions. Mild degenerative changes involving the thoracic spine.  IMPRESSION: Stable marked cardiomegaly. Mild chronic interstitial pulmonary edema, unchanged from prior examinations.   Electronically Signed   By: Evangeline Dakin M.D.   On: 03/12/2015 15:59    EKG: Orders placed or performed in visit on 10/05/14  . EKG 12-Lead     Hospital Course: [name] is a 81 y.o. who was admitted to [facility]. They were brought to the operating room on 03/19/2015 and underwent Procedure(s): LEFT TOTAL KNEE ARTHROPLASTY.  Patient tolerated the procedure well and was later transferred to the recovery room and then to the orthopaedic floor for postoperative care.  They were given PO and IV analgesics for pain control following their surgery.  They were given 24 hours of postoperative antibiotics of  Anti-infectives     Start     Dose/Rate Route Frequency Ordered Stop   03/20/15 0000  vancomycin (VANCOCIN) IVPB 1000 mg/200 mL premix     1,000 mg 200 mL/hr over 60 Minutes Intravenous Every 12 hours 03/19/15 1746 03/20/15 0105   03/19/15 0600  vancomycin (VANCOCIN) 1,500 mg in sodium chloride 0.9 % 500 mL IVPB     1,500 mg 250 mL/hr over 120 Minutes Intravenous On call to O.R. 03/18/15 1606 03/19/15 1245     and started on DVT prophylaxis in the form of Aspirin and Coumadin.   PT and OT were ordered for total joint protocol.  Discharge planning consulted to help with postop disposition and equipment needs.  Patient had a fair night on the evening of surgery.  They started to get up OOB with therapy on day one. Hemovac drain was pulled without difficulty.  Continued to work with therapy into day two.  Dressing was changed on day two and the incision was clean and dry.  The patient had progressed with therapy and meeting their goals.  Incision was healing well.  Patient was seen in rounds and was ready to go home.   Diet: Cardiac diet and Diabetic diet Activity:WBAT Follow-up:in 2 weeks  Disposition - Skilled nursing facility Discharged Condition: stable   Discharge Instructions    Call MD / Call 911    Complete by:  As directed   If you experience chest pain or shortness of breath, CALL 911 and be transported to the hospital emergency room.  If you develope a fever above 101 F, pus (white drainage) or increased drainage or redness at the wound, or calf pain, call your surgeon's office.     Constipation Prevention    Complete by:  As directed   Drink plenty of fluids.  Prune juice may be helpful.  You may use a stool softener, such as Colace (over the counter) 100 mg twice a day.  Use MiraLax (over the counter) for constipation as needed.     Diet - low sodium heart healthy    Complete by:  As directed      Diet Carb Modified    Complete by:  As directed      Discharge instructions    Complete by:  As  directed   INSTRUCTIONS AFTER JOINT REPLACEMENT   Remove items at home which could result in a fall. This includes throw rugs or furniture in walking pathways ICE to the affected joint every three hours while awake for 30 minutes at a time, for at least the first 3-5 days, and then as needed for pain and swelling.  Continue to use ice for pain and swelling. You may notice swelling that will progress down to the foot and ankle.  This is normal after surgery.  Elevate your leg when you are not up walking on it.   Continue to use the breathing machine you got in the hospital (incentive spirometer) which will help keep your temperature down.  It is common for your temperature to cycle up and down following surgery, especially at night when you are not up moving around and exerting yourself.  The breathing machine keeps your lungs expanded and your temperature down.   DIET:  As you were doing prior to hospitalization, we recommend a well-balanced diet.  DRESSING / WOUND CARE / SHOWERING  Keep the surgical dressing until follow up.  The dressing is water proof, so you can shower without any extra covering.  IF THE DRESSING FALLS OFF or the wound gets wet inside, change the dressing with sterile gauze.  Please use good hand washing techniques before changing the dressing.  Do not use any lotions or creams on the incision until instructed by your surgeon.    ACTIVITY  Increase activity slowly as tolerated, but follow the weight bearing instructions below.   No driving for 6 weeks or until further direction given by your physician.  You cannot drive while taking narcotics.  No lifting or carrying greater than 10 lbs. until further directed by your surgeon. Avoid periods of inactivity such as sitting longer than an hour when not asleep. This helps prevent blood clots.  You may return to work once you are authorized by your doctor.     WEIGHT BEARING   Weight bearing as tolerated with assist device  (walker, cane, etc) as directed, use it as long as suggested by your surgeon or therapist, typically at least 4-6 weeks.   EXERCISES  Results after joint replacement surgery are often greatly improved when you follow the exercise, range of motion and muscle strengthening exercises prescribed by your doctor. Safety measures are also important to protect the joint from further injury. Any time any of these exercises cause  you to have increased pain or swelling, decrease what you are doing until you are comfortable again and then slowly increase them. If you have problems or questions, call your caregiver or physical therapist for advice.   Rehabilitation is important following a joint replacement. After just a few days of immobilization, the muscles of the leg can become weakened and shrink (atrophy).  These exercises are designed to build up the tone and strength of the thigh and leg muscles and to improve motion. Often times heat used for twenty to thirty minutes before working out will loosen up your tissues and help with improving the range of motion but do not use heat for the first two weeks following surgery (sometimes heat can increase post-operative swelling).   These exercises can be done on a training (exercise) mat, on the floor, on a table or on a bed. Use whatever works the best and is most comfortable for you.    Use music or television while you are exercising so that the exercises are a pleasant break in your day. This will make your life better with the exercises acting as a break in your routine that you can look forward to.   Perform all exercises about fifteen times, three times per day or as directed.  You should exercise both the operative leg and the other leg as well.   Exercises include:   Quad Sets - Tighten up the muscle on the front of the thigh (Quad) and hold for 5-10 seconds.   Straight Leg Raises - With your knee straight (if you were given a brace, keep it on), lift the  leg to 60 degrees, hold for 3 seconds, and slowly lower the leg.  Perform this exercise against resistance later as your leg gets stronger.  Leg Slides: Lying on your back, slowly slide your foot toward your buttocks, bending your knee up off the floor (only go as far as is comfortable). Then slowly slide your foot back down until your leg is flat on the floor again.  Angel Wings: Lying on your back spread your legs to the side as far apart as you can without causing discomfort.  Hamstring Strength:  Lying on your back, push your heel against the floor with your leg straight by tightening up the muscles of your buttocks.  Repeat, but this time bend your knee to a comfortable angle, and push your heel against the floor.  You may put a pillow under the heel to make it more comfortable if necessary.   A rehabilitation program following joint replacement surgery can speed recovery and prevent re-injury in the future due to weakened muscles. Contact your doctor or a physical therapist for more information on knee rehabilitation.    CONSTIPATION  Constipation is defined medically as fewer than three stools per week and severe constipation as less than one stool per week.  Even if you have a regular bowel pattern at home, your normal regimen is likely to be disrupted due to multiple reasons following surgery.  Combination of anesthesia, postoperative narcotics, change in appetite and fluid intake all can affect your bowels.   YOU MUST use at least one of the following options; they are listed in order of increasing strength to get the job done.  They are all available over the counter, and you may need to use some, POSSIBLY even all of these options:    Drink plenty of fluids (prune juice may be helpful) and high fiber foods Colace 100  mg by mouth twice a day  Senokot for constipation as directed and as needed Dulcolax (bisacodyl), take with full glass of water  Miralax (polyethylene glycol) once or twice  a day as needed.  If you have tried all these things and are unable to have a bowel movement in the first 3-4 days after surgery call either your surgeon or your primary doctor.    If you experience loose stools or diarrhea, hold the medications until you stool forms back up.  If your symptoms do not get better within 1 week or if they get worse, check with your doctor.  If you experience "the worst abdominal pain ever" or develop nausea or vomiting, please contact the office immediately for further recommendations for treatment.   ITCHING:  If you experience itching with your medications, try taking only a single pain pill, or even half a pain pill at a time.  You can also use Benadryl over the counter for itching or also to help with sleep.   TED HOSE STOCKINGS:  Use stockings on both legs until for at least 2 weeks or as directed by physician office. They may be removed at night for sleeping.  MEDICATIONS:  See your medication summary on the "After Visit Summary" that nursing will review with you.  You may have some home medications which will be placed on hold until you complete the course of blood thinner medication.  It is important for you to complete the blood thinner medication as prescribed.  PRECAUTIONS:  If you experience chest pain or shortness of breath - call 911 immediately for transfer to the hospital emergency department.   If you develop a fever greater that 101 F, purulent drainage from wound, increased redness or drainage from wound, foul odor from the wound/dressing, or calf pain - CONTACT YOUR SURGEON.                                                   FOLLOW-UP APPOINTMENTS:  If you do not already have a post-op appointment, please call the office for an appointment to be seen by your surgeon.  Guidelines for how soon to be seen are listed in your "After Visit Summary", but are typically between 1-4 weeks after surgery.   MAKE SURE YOU:  Understand these instructions.    Get help right away if you are not doing well or get worse.    Thank you for letting us be a part of your medical care team.  It is a privilege we respect greatly.  We hope these instructions will help you stay on track for a fast and full recovery!     Increase activity slowly as tolerated    Complete by:  As directed             Medication List    STOP taking these medications        doxycycline 100 MG EC tablet  Commonly known as:  DORYX     enoxaparin 100 MG/ML injection  Commonly known as:  LOVENOX      TAKE these medications        ALPRAZolam 0.25 MG tablet  Commonly known as:  XANAX  Take 0.25 mg by mouth 2 (two) times daily as needed for anxiety.     ARGINAID PO  Take 1 packet by mouth 2 (  two) times daily. Mix with 6-8 ounces of water for 28 days for wound healing     aspirin EC 81 MG tablet  Take 81 mg by mouth daily.     cholecalciferol 1000 UNITS tablet  Commonly known as:  VITAMIN D  Take 3,000 Units by mouth daily.     citalopram 20 MG tablet  Commonly known as:  CELEXA  Take 20 mg by mouth every morning.     clindamycin 150 MG capsule  Commonly known as:  CLEOCIN     clindamycin 300 MG capsule  Commonly known as:  CLEOCIN     docusate sodium 100 MG capsule  Commonly known as:  COLACE  Take 100 mg by mouth 2 (two) times daily.     fentaNYL 25 MCG/HR patch  Commonly known as:  DURAGESIC - dosed mcg/hr  Place 25 mcg onto the skin every 3 (three) days.     folic acid 1 MG tablet  Commonly known as:  FOLVITE  Take 1 mg by mouth every morning.     furosemide 80 MG tablet  Commonly known as:  LASIX  Take 80 mg by mouth 3 (three) times daily.     insulin aspart 100 UNIT/ML FlexPen  Commonly known as:  NOVOLOG  Inject 1-10 Units into the skin 3 (three) times daily with meals. Sliding scale 70-100=0 units 101-150=1 unit 151-200=2 units 201-250= 4 units 251-300=6 units 301-350=8 units 350< = 10 units     insulin aspart 100 UNIT/ML injection   Commonly known as:  novoLOG  Inject 5 Units into the skin 3 (three) times daily before meals.     LEVEMIR 100 UNIT/ML injection  Generic drug:  insulin detemir  Inject 10 Units into the skin at bedtime.     levothyroxine 125 MCG tablet  Commonly known as:  SYNTHROID, LEVOTHROID  Take 125 mcg by mouth daily before breakfast.     Melatonin 3 MG Caps  Take 1 capsule by mouth at bedtime.     methocarbamol 500 MG tablet  Commonly known as:  ROBAXIN  Take 1 tablet (500 mg total) by mouth every 6 (six) hours as needed for muscle spasms.     metoprolol tartrate 25 MG tablet  Commonly known as:  LOPRESSOR  Take 25 mg by mouth 2 (two) times daily.     NU-IRON 150 MG capsule  Generic drug:  iron polysaccharides  Take 150 mg by mouth daily.     oxyCODONE-acetaminophen 5-325 MG per tablet  Commonly known as:  PERCOCET/ROXICET  Take 1-2 tablets by mouth every 4 (four) hours as needed for severe pain.     polyethylene glycol packet  Commonly known as:  MIRALAX / GLYCOLAX  Take 17 g by mouth daily as needed for mild constipation.     potassium chloride SA 20 MEQ tablet  Commonly known as:  K-DUR,KLOR-CON     pravastatin 40 MG tablet  Commonly known as:  PRAVACHOL  Take 40 mg by mouth every evening.     sulfamethoxazole-trimethoprim 800-160 MG per tablet  Commonly known as:  BACTRIM DS,SEPTRA DS     traMADol 50 MG tablet  Commonly known as:  ULTRAM  Take 50 mg by mouth 3 (three) times daily as needed for moderate pain.     vitamin C 500 MG tablet  Commonly known as:  ASCORBIC ACID  Take 500 mg by mouth every morning.     warfarin 5 MG tablet  Commonly known as:  COUMADIN  Take 5 mg by mouth every evening.           Follow-up Information    Follow up with GIOFFRE,RONALD A, MD. Schedule an appointment as soon as possible for a visit in 2 weeks.   Specialty:  Orthopedic Surgery   Contact information:   [address] [place] [address] [phone]      ADDITIONAL INSTRUCTIONS AND ORDERS FOR SNF  Patient will need to resume regular PT/INR labs to keep in therapeutic range. Patient has resumed Coumadin and aspirin post op.  Patient will be discharge under 1L oxygen with nasal cannula. This will need to be continued at SNF until patient's oxygen sat is stabilized under room air. SNF staff may attempt to patient off of O2 or increase to 2L at own discretion.   Signed: Ardeen Jourdain, PA-C Orthopaedic Surgery 03/21/2015, 8:31 AM

## 2015-03-21 NOTE — Progress Notes (Signed)
Subjective: 2 Days Post-Op Procedure(s) (LRB): LEFT TOTAL KNEE ARTHROPLASTY (Left) Patient reports pain as 3 on 0-10 scale. She urinated all over her Left leg. I cleaned her leg and changed her dressing.She is doin much better this morning.   Objective: Vital signs in last 24 hours: Temp:  [97.9 F (36.6 C)-98.7 F (37.1 C)] 98.2 F (36.8 C) (08/21 0502) Pulse Rate:  [63-87] 63 (08/21 0502) Resp:  [17-18] 17 (08/21 0502) BP: (104-136)/(52-75) 136/54 mmHg (08/21 0502) SpO2:  [95 %-96 %] 96 % (08/21 0502)  Intake/Output from previous day: 08/20 0701 - 08/21 0700 In: 1070 [P.O.:120; I.V.:950] Out: 500 [Urine:500] Intake/Output this shift:     Recent Labs  03/20/15 0506 03/21/15 0530  HGB 10.7* 9.6*    Recent Labs  03/20/15 0506 03/21/15 0530  WBC 10.2 9.9  RBC 4.13 3.73*  HCT 36.0 32.7*  PLT 174 147*    Recent Labs  03/20/15 0506 03/21/15 0530  NA 140 138  K 4.2 4.2  CL 106 105  CO2 27 27  BUN 30* 38*  CREATININE 1.40* 1.70*  GLUCOSE 258* 218*  CALCIUM 8.1* 8.5*    Recent Labs  03/20/15 0506 03/21/15 0530  INR 1.20 1.44    Dorsiflexion/Plantar flexion intact No cellulitis present Compartment soft  Assessment/Plan: 2 Days Post-Op Procedure(s) (LRB): LEFT TOTAL KNEE ARTHROPLASTY (Left) Up with therapy Discharge to SNF  Chenae Brager A 03/21/2015, 8:33 AM

## 2015-03-21 NOTE — Progress Notes (Signed)
ANTICOAGULATION CONSULT NOTE - follow up  Pharmacy Consult for Warfarin Indication: atrial fibrillation, chronic Warfarin PTA resumed s/p L TKA  Allergies  Allergen Reactions  . Penicillins Other (See Comments)    Pt. Not sure of reaction.   Patient Measurements: Height: 5' (152.4 cm) Weight: 237 lb 3.2 oz (107.593 kg) IBW/kg (Calculated) : 45.5  Vital Signs: Temp: 98.2 F (36.8 C) (08/21 0502) Temp Source: Oral (08/21 0502) BP: 136/54 mmHg (08/21 0502) Pulse Rate: 63 (08/21 0502)  Labs:  Recent Labs  03/19/15 1050 03/20/15 0506 03/21/15 0530  HGB  --  10.7* 9.6*  HCT  --  36.0 32.7*  PLT  --  174 147*  LABPROT 14.6 15.4* 17.6*  INR 1.12 1.20 1.44  CREATININE  --  1.40* 1.70*   Estimated Creatinine Clearance: 29.3 mL/min (by C-G formula based on Cr of 1.7).  Medical History: Past Medical History  Diagnosis Date  . Anemia 2008  . DM (diabetes mellitus) 1999  . Cataract     bilaterla   . Permanent atrial fibrillation   . CAD (coronary artery disease)   . CRI (chronic renal insufficiency) 2008  . Hypertension   . Hyperlipemia   . GERD (gastroesophageal reflux disease)   . Arthritis   . Acute respiratory failure with hypoxia 06/12/2013    secondary to acute on chronic diastolic failure.  . Wrist fracture, left 06/13/2013    03-12-15 no residual issues  . Acute on chronic congestive heart failure with left ventricular diastolic dysfunction 72/53/6644  . Hx of CABG 10/24/2007    Dr. Roxan Hockey  . S/P CABG x 4   . Pacemaker 10/24/2007    Medtronic EnRhythm; implanted for SSS, symptomatic bradycardia, PAF  . Fracture November 2014    Left wrist-03-12-15 no issues now  . Obesity   . Transfusion history   . Venous insufficiency     8-12-16left lower lateral outside healed leg ulcer-scabbed over.   Medications:  Scheduled:  . citalopram  20 mg Oral q morning - 10a  . ferrous sulfate  325 mg Oral TID PC  . furosemide  80 mg Oral TID  . insulin aspart  0-15  Units Subcutaneous TID WC  . levothyroxine  125 mcg Oral QAC breakfast  . metoprolol tartrate  25 mg Oral BID  . pravastatin  40 mg Oral QPM  . Warfarin - Pharmacist Dosing Inpatient   Does not apply q1800   Assessment: 69 yoF s/p L TKA, on chronic Warfarin 5mg  daily for Afib, last dose 8/13. Lovenox bridging from 8/14 of 100mg  bid with last dose 8/18 pm.   Today, 03/21/2015  INR subtherapeutic as expected POD2 and doses of warfarin 5mg  and 7.5mg   Hgb ok  No reported bleeding  Goal of Therapy:  INR 2-3 Monitor platelets by anticoagulation protocol: Yes   Plan:  1) Warfarin 5mg  tonight 2) Daily INR 3) Plan discharge to SNF  Adrian Saran, PharmD, BCPS Pager [phone] 03/21/2015 8:33 AM

## 2015-03-21 NOTE — Discharge Instructions (Signed)
INSTRUCTIONS AFTER JOINT REPLACEMENT  ° °Remove items at home which could result in a fall. This includes throw rugs or furniture in walking pathways °ICE to the affected joint every three hours while awake for 30 minutes at a time, for at least the first 3-5 days, and then as needed for pain and swelling.  Continue to use ice for pain and swelling. You may notice swelling that will progress down to the foot and ankle.  This is normal after surgery.  Elevate your leg when you are not up walking on it.   °Continue to use the breathing machine you got in the hospital (incentive spirometer) which will help keep your temperature down.  It is common for your temperature to cycle up and down following surgery, especially at night when you are not up moving around and exerting yourself.  The breathing machine keeps your lungs expanded and your temperature down. ° ° °DIET:  As you were doing prior to hospitalization, we recommend a well-balanced diet. ° °DRESSING / WOUND CARE / SHOWERING ° °Keep the surgical dressing until follow up.  The dressing is water proof, so you can shower without any extra covering.  IF THE DRESSING FALLS OFF or the wound gets wet inside, change the dressing with sterile gauze.  Please use good hand washing techniques before changing the dressing.  Do not use any lotions or creams on the incision until instructed by your surgeon.   ° °ACTIVITY ° °Increase activity slowly as tolerated, but follow the weight bearing instructions below.   °No driving for 6 weeks or until further direction given by your physician.  You cannot drive while taking narcotics.  °No lifting or carrying greater than 10 lbs. until further directed by your surgeon. °Avoid periods of inactivity such as sitting longer than an hour when not asleep. This helps prevent blood clots.  °You may return to work once you are authorized by your doctor.  ° ° ° °WEIGHT BEARING  ° °Weight bearing as tolerated with assist device (walker, cane,  etc) as directed, use it as long as suggested by your surgeon or therapist, typically at least 4-6 weeks. ° ° °EXERCISES ° °Results after joint replacement surgery are often greatly improved when you follow the exercise, range of motion and muscle strengthening exercises prescribed by your doctor. Safety measures are also important to protect the joint from further injury. Any time any of these exercises cause you to have increased pain or swelling, decrease what you are doing until you are comfortable again and then slowly increase them. If you have problems or questions, call your caregiver or physical therapist for advice.  ° °Rehabilitation is important following a joint replacement. After just a few days of immobilization, the muscles of the leg can become weakened and shrink (atrophy).  These exercises are designed to build up the tone and strength of the thigh and leg muscles and to improve motion. Often times heat used for twenty to thirty minutes before working out will loosen up your tissues and help with improving the range of motion but do not use heat for the first two weeks following surgery (sometimes heat can increase post-operative swelling).  ° °These exercises can be done on a training (exercise) mat, on the floor, on a table or on a bed. Use whatever works the best and is most comfortable for you.    Use music or television while you are exercising so that the exercises are a pleasant break in your   day. This will make your life better with the exercises acting as a break in your routine that you can look forward to.   Perform all exercises about fifteen times, three times per day or as directed.  You should exercise both the operative leg and the other leg as well.  ° °Exercises include: °  °Quad Sets - Tighten up the muscle on the front of the thigh (Quad) and hold for 5-10 seconds.   °Straight Leg Raises - With your knee straight (if you were given a brace, keep it on), lift the leg to 60  degrees, hold for 3 seconds, and slowly lower the leg.  Perform this exercise against resistance later as your leg gets stronger.  °Leg Slides: Lying on your back, slowly slide your foot toward your buttocks, bending your knee up off the floor (only go as far as is comfortable). Then slowly slide your foot back down until your leg is flat on the floor again.  °Angel Wings: Lying on your back spread your legs to the side as far apart as you can without causing discomfort.  °Hamstring Strength:  Lying on your back, push your heel against the floor with your leg straight by tightening up the muscles of your buttocks.  Repeat, but this time bend your knee to a comfortable angle, and push your heel against the floor.  You may put a pillow under the heel to make it more comfortable if necessary.  ° °A rehabilitation program following joint replacement surgery can speed recovery and prevent re-injury in the future due to weakened muscles. Contact your doctor or a physical therapist for more information on knee rehabilitation.  ° ° °CONSTIPATION ° °Constipation is defined medically as fewer than three stools per week and severe constipation as less than one stool per week.  Even if you have a regular bowel pattern at home, your normal regimen is likely to be disrupted due to multiple reasons following surgery.  Combination of anesthesia, postoperative narcotics, change in appetite and fluid intake all can affect your bowels.  ° °YOU MUST use at least one of the following options; they are listed in order of increasing strength to get the job done.  They are all available over the counter, and you may need to use some, POSSIBLY even all of these options:   ° °Drink plenty of fluids (prune juice may be helpful) and high fiber foods °Colace 100 mg by mouth twice a day  °Senokot for constipation as directed and as needed Dulcolax (bisacodyl), take with full glass of water  °Miralax (polyethylene glycol) once or twice a day as  needed. ° °If you have tried all these things and are unable to have a bowel movement in the first 3-4 days after surgery call either your surgeon or your primary doctor.   ° °If you experience loose stools or diarrhea, hold the medications until you stool forms back up.  If your symptoms do not get better within 1 week or if they get worse, check with your doctor.  If you experience "the worst abdominal pain ever" or develop nausea or vomiting, please contact the office immediately for further recommendations for treatment. ° ° °ITCHING:  If you experience itching with your medications, try taking only a single pain pill, or even half a pain pill at a time.  You can also use Benadryl over the counter for itching or also to help with sleep.  ° °TED HOSE STOCKINGS:  Use stockings on   both legs until for at least 2 weeks or as directed by physician office. They may be removed at night for sleeping. ° °MEDICATIONS:  See your medication summary on the “After Visit Summary” that nursing will review with you.  You may have some home medications which will be placed on hold until you complete the course of blood thinner medication.  It is important for you to complete the blood thinner medication as prescribed. ° °PRECAUTIONS:  If you experience chest pain or shortness of breath - call 911 immediately for transfer to the hospital emergency department.  ° °If you develop a fever greater that 101 F, purulent drainage from wound, increased redness or drainage from wound, foul odor from the wound/dressing, or calf pain - CONTACT YOUR SURGEON.   °                                                °FOLLOW-UP APPOINTMENTS:  If you do not already have a post-op appointment, please call the office for an appointment to be seen by your surgeon.  Guidelines for how soon to be seen are listed in your “After Visit Summary”, but are typically between 1-4 weeks after surgery. ° °  °MAKE SURE YOU:  °Understand these instructions.  °Get help  right away if you are not doing well or get worse.  ° ° °Thank you for letting us be a part of your medical care team.  It is a privilege we respect greatly.  We hope these instructions will help you stay on track for a fast and full recovery!  ° °

## 2015-03-21 NOTE — Progress Notes (Signed)
Called RN Carlynn Spry) at [facility].  All questions answered.  Patient VSS.  IV taken out.  Waiting for [facility] to pick patient up.

## 2015-03-22 ENCOUNTER — Encounter (HOSPITAL_COMMUNITY): Payer: Self-pay | Admitting: Orthopedic Surgery

## 2015-04-28 ENCOUNTER — Encounter: Payer: Self-pay | Admitting: Vascular Surgery

## 2015-04-30 ENCOUNTER — Encounter (HOSPITAL_COMMUNITY): Payer: Medicare HMO

## 2015-04-30 ENCOUNTER — Ambulatory Visit: Payer: Medicare HMO | Admitting: Family

## 2015-05-07 ENCOUNTER — Encounter: Payer: Medicare HMO | Admitting: Internal Medicine

## 2015-05-10 ENCOUNTER — Encounter: Payer: Self-pay | Admitting: Family

## 2015-05-11 ENCOUNTER — Ambulatory Visit: Payer: Medicare HMO | Admitting: Family

## 2015-05-11 ENCOUNTER — Encounter (HOSPITAL_COMMUNITY): Payer: Medicare HMO

## 2015-05-14 ENCOUNTER — Encounter: Payer: Medicare HMO | Admitting: Internal Medicine

## 2015-05-18 ENCOUNTER — Encounter: Payer: Self-pay | Admitting: Family

## 2015-05-19 ENCOUNTER — Ambulatory Visit: Payer: Medicare HMO | Admitting: Family

## 2015-05-19 ENCOUNTER — Encounter (HOSPITAL_COMMUNITY): Payer: Medicare HMO

## 2015-06-01 ENCOUNTER — Encounter: Payer: Self-pay | Admitting: *Deleted

## 2015-07-27 ENCOUNTER — Encounter: Payer: Self-pay | Admitting: *Deleted

## 2015-08-05 ENCOUNTER — Ambulatory Visit: Payer: Medicare HMO | Admitting: Family

## 2015-08-05 ENCOUNTER — Encounter (HOSPITAL_COMMUNITY): Payer: Medicare HMO

## 2015-08-18 ENCOUNTER — Ambulatory Visit: Payer: Medicare HMO | Admitting: Cardiovascular Disease

## 2015-08-23 ENCOUNTER — Ambulatory Visit (INDEPENDENT_AMBULATORY_CARE_PROVIDER_SITE_OTHER): Payer: Medicare HMO | Admitting: Cardiovascular Disease

## 2015-08-23 ENCOUNTER — Encounter: Payer: Self-pay | Admitting: Cardiovascular Disease

## 2015-08-23 ENCOUNTER — Encounter: Payer: Self-pay | Admitting: Family

## 2015-08-23 VITALS — BP 150/84 | HR 92 | Ht 64.0 in | Wt 248.0 lb

## 2015-08-23 DIAGNOSIS — Z950 Presence of cardiac pacemaker: Secondary | ICD-10-CM

## 2015-08-23 DIAGNOSIS — I482 Chronic atrial fibrillation, unspecified: Secondary | ICD-10-CM

## 2015-08-23 DIAGNOSIS — I5033 Acute on chronic diastolic (congestive) heart failure: Secondary | ICD-10-CM

## 2015-08-23 DIAGNOSIS — I25812 Atherosclerosis of bypass graft of coronary artery of transplanted heart without angina pectoris: Secondary | ICD-10-CM

## 2015-08-23 DIAGNOSIS — I739 Peripheral vascular disease, unspecified: Secondary | ICD-10-CM

## 2015-08-23 DIAGNOSIS — I10 Essential (primary) hypertension: Secondary | ICD-10-CM

## 2015-08-23 MED ORDER — TORSEMIDE 20 MG PO TABS: 40.0000 mg | ORAL_TABLET | Freq: Two times a day (BID) | ORAL | Status: DC

## 2015-08-23 MED ORDER — METOLAZONE 2.5 MG PO TABS: 2.5000 mg | ORAL_TABLET | Freq: Every day | ORAL | Status: DC

## 2015-08-23 NOTE — Progress Notes (Signed)
Patient ID: [name], female   DOB: [birth year], 82 y.o.   MRN: [id number]      SUBJECTIVE: The patient presents for follow up of coronary artery disease. She denies chest pain and shortness of breath. She has considerable leg swelling. Her weight was 239 pounds on 01/18/15 and weight is 248 pounds today. Had knee surgery and wound reopened, follows with orthopedic surgery. Does not report any recent hospitalizations.   Review of Systems: As per "subjective", otherwise negative.  Allergies  Allergen Reactions  . Penicillins Other (See Comments)    Pt. Not sure of reaction.    Current Outpatient Prescriptions  Medication Sig Dispense Refill  . ALPRAZolam (XANAX) 0.25 MG tablet Take 0.25 mg by mouth 2 (two) times daily as needed for anxiety.     Marland Kitchen aspirin EC 81 MG tablet Take 81 mg by mouth daily.    . cholecalciferol (VITAMIN D) 1000 UNITS tablet Take 3,000 Units by mouth daily.    . citalopram (CELEXA) 20 MG tablet Take 20 mg by mouth every morning.     . docusate sodium (COLACE) 100 MG capsule Take 100 mg by mouth 2 (two) times daily.    Marland Kitchen doxycycline (VIBRAMYCIN) 100 MG capsule Take 100 mg by mouth daily.    . fentaNYL (DURAGESIC - DOSED MCG/HR) 25 MCG/HR patch Place 25 mcg onto the skin every 3 (three) days.    . folic acid (FOLVITE) 1 MG tablet Take 1 mg by mouth every morning.     . furosemide (LASIX) 80 MG tablet Take 80 mg by mouth 3 (three) times daily.    . insulin aspart (NOVOLOG) 100 UNIT/ML FlexPen Inject 1-10 Units into the skin 3 (three) times daily with meals. Sliding scale 70-100=0 units 101-150=1 unit 151-200=2 units 201-250= 4 units 251-300=6 units 301-350=8 units 350< = 10 units    . insulin aspart (NOVOLOG) 100 UNIT/ML injection Inject 5 Units into the skin 3 (three) times daily before meals.    . iron polysaccharides (NU-IRON) 150 MG capsule Take 150 mg by mouth daily.    Marland Kitchen LEVEMIR 100 UNIT/ML injection Inject 10 Units into the skin at bedtime.     Marland Kitchen  levothyroxine (SYNTHROID, LEVOTHROID) 125 MCG tablet Take 125 mcg by mouth daily before breakfast.    . Melatonin 3 MG CAPS Take 1 capsule by mouth at bedtime.    . methocarbamol (ROBAXIN) 500 MG tablet Take 1 tablet (500 mg total) by mouth every 6 (six) hours as needed for muscle spasms. 40 tablet 1  . metoprolol tartrate (LOPRESSOR) 25 MG tablet Take 25 mg by mouth 2 (two) times daily.     . Nutritional Supplements (ARGINAID PO) Take 1 packet by mouth 2 (two) times daily. Mix with 6-8 ounces of water for 28 days for wound healing    . oxyCODONE-acetaminophen (PERCOCET/ROXICET) 5-325 MG per tablet Take 1-2 tablets by mouth every 4 (four) hours as needed for severe pain. 30 tablet 0  . polyethylene glycol (MIRALAX / GLYCOLAX) packet Take 17 g by mouth daily as needed for mild constipation.     . potassium chloride SA (K-DUR,KLOR-CON) 20 MEQ tablet     . pravastatin (PRAVACHOL) 40 MG tablet Take 40 mg by mouth every evening.     . traMADol (ULTRAM) 50 MG tablet Take 50 mg by mouth 3 (three) times daily as needed for moderate pain.     . vitamin C (ASCORBIC ACID) 500 MG tablet Take 500 mg by mouth every morning.    Marland Kitchen  warfarin (COUMADIN) 5 MG tablet Take 7.5 mg by mouth every evening.      No current facility-administered medications for this visit.   Facility-Administered Medications Ordered in Other Visits  Medication Dose Route Frequency Provider Last Rate Last Dose  . bupivacaine liposome (EXPAREL) 1.3 % injection 266 mg  20 mL Infiltration Once Ardeen Jourdain, PA-C        Past Medical History  Diagnosis Date  . Anemia 2008  . DM (diabetes mellitus) ([place]) 1999  . Cataract     bilaterla   . Permanent atrial fibrillation ([place])   . CAD (coronary artery disease)   . CRI (chronic renal insufficiency) 2008  . Hypertension   . Hyperlipemia   . GERD (gastroesophageal reflux disease)   . Arthritis   . Acute respiratory failure with hypoxia ([place]) 06/12/2013    secondary to acute on chronic  diastolic failure.  . Wrist fracture, left 06/13/2013    03-12-15 no residual issues  . Acute on chronic congestive heart failure with left ventricular diastolic dysfunction ([place]) 06/11/2013  . Hx of CABG 10/24/2007    Dr. Roxan Hockey  . S/P CABG x 4   . Pacemaker 10/24/2007    Medtronic EnRhythm; implanted for SSS, symptomatic bradycardia, PAF  . Fracture November 2014    Left wrist-03-12-15 no issues now  . Obesity   . Transfusion history   . Venous insufficiency     8-12-16left lower lateral outside healed leg ulcer-scabbed over.    Past Surgical History  Procedure Laterality Date  . Esophagogastroduodenoscopy  11/07/2007    [id number] Schatzki's ring.  Otherwise normal esophagus   . Appendectomy      APPENDICITIS  . Coronary artery bypass graft  10/24/2007    LIMA to LAD, SVG to DX1, SVG to OM, SVG to distal RCA (Dr. Roxan Hockey)  . Pacemaker insertion  10/24/2007    MDT EnRhythm implanted by Dr Rollene Fare  . Total knee arthroplasty Right     2012  . Coronary angioplasty  11/16/2003    PCI to prox mid RCA (2.75x34mm chromium cobalt vision Guidant stent) (Dr. Marella Chimes)  . Back surgery    . Tonsillectomy      age 73  . Insert / replace / remove pacemaker    . Joint replacement Right   . Colonoscopy with esophagogastroduodenoscopy (egd) N/A 11/25/2012    SLF:Two SMALL AC polyps/Small internal hemorrhoids/The colon IS SLIGHTLY redundant-EGD:Schatzki ring at the gastroesophageal junction/Small hiatal hernia/MILD Non-erosive gastritis  . Transthoracic echocardiogram  08/2012    EF 55-65%, mod conc hypertrophy; mild AV regurg; calcified MV annulus with mod MR; LA mod diated; RV systolic pressure increased; RA mildly dilated; PA peak pressure 4mmHg  . Nm myocar perf wall motion  2011    dipyridamole myoview - moderate ischemai in mid anteroseptal basal inferior inferioer, mid lateral and basal inferolateral region; abnormal study, EF 41%; high risk scan  . Cardiac catheterization   08/08/2007    3 vessel CAD (LAD, mid Cfx, mid RCA) (Dr. Marella Chimes)  . Tubal ligation    . Cataract extraction, bilateral    . Eye surgery Left Aug. 2015    torned retina repair  . Total knee arthroplasty Left 03/19/2015    Procedure: LEFT TOTAL KNEE ARTHROPLASTY;  Surgeon: Latanya Maudlin, MD;  Location: [facility];  Service: Orthopedics;  Laterality: Left;    Social History   Social History  . Marital Status: Widowed    Spouse Name: N/A  . Number of Children: 4  .  Years of Education: N/A   Occupational History  .     Social History Main Topics  . Smoking status: Former Smoker -- 1.00 packs/day for 4 years    Types: Cigarettes    Quit date: 10/20/1999  . Smokeless tobacco: Never Used  . Alcohol Use: No  . Drug Use: No  . Sexual Activity: No   Other Topics Concern  . Not on file   Social History Narrative   Nursing home resident     Filed Vitals:   08/23/15 1414  BP: 150/84  Pulse: 92  Height: 5\' 4"  (1.626 m)  Weight: 248 lb (112.492 kg)  SpO2: 92%    PHYSICAL EXAM General: NAD  Neck: No JVD, no thyromegaly.  Lungs: Normal respiratory effort and end-expiratory wheezes.  CV: Nondisplaced PMI. Irregular rhythm, normal rate, normal S1/S2, no S3, no murmur. Marked 2+ pitting pretibial edema b/l. Abdomen: Soft, nontender, obese.  Neurologic: Alert and oriented.  Psych: Normal affect. Skin: Normal. Musculoskeletal: No gross deformities.   ECG: Most recent ECG reviewed.      ASSESSMENT AND PLAN: 1. CAD/CABG: She remains symptomatically stable. Normal LV systolic function by echocardiogram. Myocardial perfusion images on 10/19/14 demonstrated a mild to moderate amount of anteroapical and anterolateral wall ischemia with anterolateral wall hypokinesis. Left ventricular systolic function was moderately reduced, calculated LVEF 37%.  Echocardiogram on 10/28/14 demonstrated normal left ventricular systolic function, EF 0000000, moderate concentric LVH, high  ventricular filling pressures, ischemic wall motion abnormalities, mild to moderate mitral regurgitation, and mild tricuspid, aortic, and pulmonic regurgitation. Continue aspirin, beta blocker, and pravastatin.  2. Permanent atrial fibrillation: Rate is controlled on metoprolol 25 mg twice daily. Maintained on warfarin, with INR managed by pharmacy.   3. Pacemaker for SSS: Normal device function on 10/16/2014. Patient is in atrial fibrillation 100% of the time with no high ventricular rates noted.  4. Acute on chronic diastolic heart failure: Not responding to high dose diuretics with 9 lb weight gain since June 2016. Will switch Lasix to torsemide 40 mg bid and add metolazone 2.5 mg daily x 3 days. Will check BMET on 1/26.  5. Essential HTN: Mildly elevated on present therapy. Monitor with modified diuretic regimen.  6. Peripheral vascular disease: Follows with Dr. Bridgett Larsson.   Dispo: f/u 1 month.   Kate Sable, M.D., F.A.C.C.

## 2015-08-23 NOTE — Patient Instructions (Signed)
   Stop Lasix   Begin Torsemide 40mg  twice a day    Begin Metolazone 2.5mg  daily x 3 days, then stop. Continue all other medications.   Lab for BMET - due 08/26/2015.   Follow up in  1 month.

## 2015-08-24 ENCOUNTER — Telehealth: Payer: Self-pay | Admitting: *Deleted

## 2015-08-24 NOTE — Telephone Encounter (Signed)
-----   Message from Herminio Commons, MD sent at 08/23/2015  4:13 PM EST ----- No Lasix. Continue torsemide and add metolazone 2.5 mg daily x 5 days instead. She may then have to take metolazone 2.5 mg three times per week, but I'll wait to see what BMET shows.  ----- Message -----    From: Massie Maroon, CMA    Sent: 08/23/2015   4:07 PM      To: Herminio Commons, MD  Nursing center called to confirm pt was already taking Torsemide 80 mg bid, has been off lasix since 06/2015. Do you want to continue lasix 40 mg that you ordered today and metolazone?  Buck Mcaffee

## 2015-08-24 NOTE — Telephone Encounter (Signed)
Spoke with [name] at [facility] center who voiced understanding.

## 2015-09-01 ENCOUNTER — Ambulatory Visit: Payer: Medicare HMO | Admitting: Family

## 2015-09-01 ENCOUNTER — Inpatient Hospital Stay (INDEPENDENT_AMBULATORY_CARE_PROVIDER_SITE_OTHER)
Admission: RE | Admit: 2015-09-01 | Discharge: 2015-09-01 | Disposition: A | Discharge status: Home / Self Care | Payer: Medicare HMO | Source: Ambulatory Visit | Attending: Family | Admitting: Family

## 2015-09-01 DIAGNOSIS — Z48812 Encounter for surgical aftercare following surgery on the circulatory system: Secondary | ICD-10-CM | POA: Diagnosis not present

## 2015-09-01 DIAGNOSIS — I739 Peripheral vascular disease, unspecified: Secondary | ICD-10-CM

## 2015-09-17 ENCOUNTER — Encounter: Payer: Self-pay | Admitting: Vascular Surgery

## 2015-09-22 ENCOUNTER — Ambulatory Visit (HOSPITAL_COMMUNITY)
Admission: RE | Admit: 2015-09-22 | Discharge: 2015-09-22 | Disposition: A | Discharge status: Home / Self Care | Payer: Medicare HMO | Source: Ambulatory Visit | Attending: Family | Admitting: Family

## 2015-09-22 ENCOUNTER — Ambulatory Visit (INDEPENDENT_AMBULATORY_CARE_PROVIDER_SITE_OTHER): Payer: Medicare HMO | Admitting: Family

## 2015-09-22 ENCOUNTER — Encounter: Payer: Self-pay | Admitting: Family

## 2015-09-22 VITALS — BP 133/46 | HR 72 | Temp 97.4°F | Resp 16 | Ht 64.0 in | Wt 220.0 lb

## 2015-09-22 DIAGNOSIS — N189 Chronic kidney disease, unspecified: Secondary | ICD-10-CM | POA: Insufficient documentation

## 2015-09-22 DIAGNOSIS — E1122 Type 2 diabetes mellitus with diabetic chronic kidney disease: Secondary | ICD-10-CM | POA: Diagnosis not present

## 2015-09-22 DIAGNOSIS — Z87891 Personal history of nicotine dependence: Secondary | ICD-10-CM

## 2015-09-22 DIAGNOSIS — I130 Hypertensive heart and chronic kidney disease with heart failure and stage 1 through stage 4 chronic kidney disease, or unspecified chronic kidney disease: Secondary | ICD-10-CM | POA: Insufficient documentation

## 2015-09-22 DIAGNOSIS — E785 Hyperlipidemia, unspecified: Secondary | ICD-10-CM | POA: Diagnosis not present

## 2015-09-22 DIAGNOSIS — I779 Disorder of arteries and arterioles, unspecified: Secondary | ICD-10-CM

## 2015-09-22 DIAGNOSIS — E1151 Type 2 diabetes mellitus with diabetic peripheral angiopathy without gangrene: Secondary | ICD-10-CM

## 2015-09-22 DIAGNOSIS — I509 Heart failure, unspecified: Secondary | ICD-10-CM | POA: Insufficient documentation

## 2015-09-22 DIAGNOSIS — I872 Venous insufficiency (chronic) (peripheral): Secondary | ICD-10-CM

## 2015-09-22 DIAGNOSIS — I8312 Varicose veins of left lower extremity with inflammation: Secondary | ICD-10-CM

## 2015-09-22 DIAGNOSIS — I739 Peripheral vascular disease, unspecified: Secondary | ICD-10-CM | POA: Diagnosis not present

## 2015-09-22 DIAGNOSIS — Z48812 Encounter for surgical aftercare following surgery on the circulatory system: Secondary | ICD-10-CM | POA: Insufficient documentation

## 2015-09-22 DIAGNOSIS — I8311 Varicose veins of right lower extremity with inflammation: Secondary | ICD-10-CM

## 2015-09-22 NOTE — Patient Instructions (Signed)
Peripheral Vascular Disease  Peripheral vascular disease (PVD) is a disease of the blood vessels that are not part of your heart and brain. A simple term for PVD is poor circulation. In most cases, PVD narrows the blood vessels that carry blood from your heart to the rest of your body. This can result in a decreased supply of blood to your arms, legs, and internal organs, like your stomach or kidneys. However, it most often affects a person's lower legs and feet.  There are two types of PVD.  · Organic PVD. This is the more common type. It is caused by damage to the structure of blood vessels.  · Functional PVD. This is caused by conditions that make blood vessels contract and tighten (spasm).  Without treatment, PVD tends to get worse over time.  PVD can also lead to acute ischemic limb. This is when an arm or limb suddenly has trouble getting enough blood. This is a medical emergency.  CAUSES  Each type of PVD has many different causes. The most common cause of PVD is buildup of a fatty material (plaque) inside of your arteries (atherosclerosis). Small amounts of plaque can break off from the walls of the blood vessels and become lodged in a smaller artery. This blocks blood flow and can cause acute ischemic limb.  Other common causes of PVD include:  · Blood clots that form inside of blood vessels.  · Injuries to blood vessels.  · Diseases that cause inflammation of blood vessels or cause blood vessel spasms.  · Health behaviors and health history that increase your risk of developing PVD.  RISK FACTORS   You may have a greater risk of PVD if you:  · Have a family history of PVD.  · Have certain medical conditions, including:    High cholesterol.    Diabetes.    High blood pressure (hypertension).    Coronary heart disease.    Past problems with blood clots.    Past injury, such as burns or a broken bone. These may have damaged blood vessels in your limbs.    Buerger disease. This is caused by inflamed blood  vessels in your hands and feet.    Some forms of arthritis.    Rare birth defects that affect the arteries in your legs.  · Use tobacco.  · Do not get enough exercise.  · Are obese.  · Are age 50 or older.  SIGNS AND SYMPTOMS   PVD may cause many different symptoms. Your symptoms depend on what part of your body is not getting enough blood. Some common signs and symptoms include:  · Cramps in your lower legs. This may be a symptom of poor leg circulation (claudication).  · Pain and weakness in your legs while you are physically active that goes away when you rest (intermittent claudication).  · Leg pain when at rest.  · Leg numbness, tingling, or weakness.  · Coldness in a leg or foot, especially when compared with the other leg.  · Skin or hair changes. These can include:    Hair loss.    Shiny skin.    Pale or bluish skin.    Thick toenails.  · Inability to get or maintain an erection (erectile dysfunction).  People with PVD are more prone to developing ulcers and sores on their toes, feet, or legs. These may take longer than normal to heal.  DIAGNOSIS  Your health care provider may diagnose PVD from your signs and symptoms.   The health care provider will also do a physical exam. You may have tests to find out what is causing your PVD and determine its severity. Tests may include:  · Blood pressure recordings from your arms and legs and measurements of the strength of your pulses (pulse volume recordings).  · Imaging studies using sound waves to take pictures of the blood flow through your blood vessels (Doppler ultrasound).  · Injecting a dye into your blood vessels before having imaging studies using:    X-rays (angiogram or arteriogram).    Computer-generated X-rays (CT angiogram).    A powerful electromagnetic field and a computer (magnetic resonance angiogram or MRA).  TREATMENT  Treatment for PVD depends on the cause of your condition and the severity of your symptoms. It also depends on your age. Underlying  causes need to be treated and controlled. These include long-lasting (chronic) conditions, such as diabetes, high cholesterol, and high blood pressure. You may need to first try making lifestyle changes and taking medicines. Surgery may be needed if these do not work.  Lifestyle changes may include:  · Quitting smoking.  · Exercising regularly.  · Following a low-fat, low-cholesterol diet.  Medicines may include:  · Blood thinners to prevent blood clots.  · Medicines to improve blood flow.  · Medicines to improve your blood cholesterol levels.  Surgical procedures may include:  · A procedure that uses an inflated balloon to open a blocked artery and improve blood flow (angioplasty).  · A procedure to put in a tube (stent) to keep a blocked artery open (stent implant).  · Surgery to reroute blood flow around a blocked artery (peripheral bypass surgery).  · Surgery to remove dead tissue from an infected wound on the affected limb.  · Amputation. This is surgical removal of the affected limb. This may be necessary in cases of acute ischemic limb that are not improved through medical or surgical treatments.  HOME CARE INSTRUCTIONS  · Take medicines only as directed by your health care provider.  · Do not use any tobacco products, including cigarettes, chewing tobacco, or electronic cigarettes.  If you need help quitting, ask your health care provider.  · Lose weight if you are overweight, and maintain a healthy weight as directed by your health care provider.  · Eat a diet that is low in fat and cholesterol. If you need help, ask your health care provider.  · Exercise regularly. Ask your health care provider to suggest some good activities for you.  · Use compression stockings or other mechanical devices as directed by your health care provider.  · Take good care of your feet.    Wear comfortable shoes that fit well.    Check your feet often for any cuts or sores.  SEEK MEDICAL CARE IF:  · You have cramps in your legs  while walking.  · You have leg pain when you are at rest.  · You have coldness in a leg or foot.  · Your skin changes.  · You have erectile dysfunction.  · You have cuts or sores on your feet that are not healing.  SEEK IMMEDIATE MEDICAL CARE IF:  · Your arm or leg turns cold and blue.  · Your arms or legs become red, warm, swollen, painful, or numb.  · You have chest pain or trouble breathing.  · You suddenly have weakness in your face, arm, or leg.  · You become very confused or lose the ability to speak.  ·   You suddenly have a very bad headache or lose your vision.     This information is not intended to replace advice given to you by your health care provider. Make sure you discuss any questions you have with your health care provider.     Document Released: 08/24/2004 Document Revised: 08/07/2014 Document Reviewed: 12/25/2013  Elsevier Interactive Patient Education ©2016 [employer]    Venous Stasis or Chronic Venous Insufficiency  Chronic venous insufficiency, also called venous stasis, is a condition that affects the veins in the legs. The condition prevents blood from being pumped through these veins effectively. Blood may no longer be pumped effectively from the legs back to the heart. This condition can range from mild to severe. With proper treatment, you should be able to continue with an active life.  CAUSES   Chronic venous insufficiency occurs when the vein walls become stretched, weakened, or damaged or when valves within the vein are damaged. Some common causes of this include:  · High blood pressure inside the veins (venous hypertension).  · Increased blood pressure in the leg veins from long periods of sitting or standing.  · A blood clot that blocks blood flow in a vein (deep vein thrombosis).  · Inflammation of a superficial vein (phlebitis) that causes a blood clot to form.  RISK FACTORS  Various things can make you more likely to develop chronic venous insufficiency, including:  · Family  history of this condition.  · Obesity.  · Pregnancy.  · Sedentary lifestyle.  · Smoking.  · Jobs requiring long periods of standing or sitting in one place.  · Being a certain age. Women in their 40s and 50s and men in their 70s are more likely to develop this condition.  SIGNS AND SYMPTOMS   Symptoms may include:   · Varicose veins.  · Skin breakdown or ulcers.  · Reddened or discolored skin on the leg.  · Brown, smooth, tight, and painful skin just above the ankle, usually on the inside surface (lipodermatosclerosis).  · Swelling.  DIAGNOSIS   To diagnose this condition, your health care provider will take a medical history and do a physical exam. The following tests may be ordered to confirm the diagnosis:  · Duplex ultrasound--A procedure that produces a picture of a blood vessel and nearby organs and also provides information on blood flow through the blood vessel.  · Plethysmography--A procedure that tests blood flow.  · A venogram, or venography--A procedure used to look at the veins using X-ray and dye.  TREATMENT  The goals of treatment are to help you return to an active life and to minimize pain or disability. Treatment will depend on the severity of the condition. Medical procedures may be needed for severe cases. Treatment options may include:   · Use of compression stockings. These can help with symptoms and lower the chances of the problem getting worse, but they do not cure the problem.  · Sclerotherapy--A procedure involving an injection of a material that "dissolves" the damaged veins. Other veins in the network of blood vessels take over the function of the damaged veins.  · Surgery to remove the vein or cut off blood flow through the vein (vein stripping or laser ablation surgery).  · Surgery to repair a valve.  HOME CARE INSTRUCTIONS   · Wear compression stockings as directed by your health care provider.  · Only take over-the-counter or prescription medicines for pain, discomfort, or fever as  directed by your health   care provider.  · Follow up with your health care provider as directed.  SEEK MEDICAL CARE IF:   · You have redness, swelling, or increasing pain in the affected area.  · You see a red streak or line that extends up or down from the affected area.  · You have a breakdown or loss of skin in the affected area, even if the breakdown is small.  · You have an injury to the affected area.  SEEK IMMEDIATE MEDICAL CARE IF:   · You have an injury and open wound in the affected area.  · Your pain is severe and does not improve with medicine.  · You have sudden numbness or weakness in the foot or ankle below the affected area, or you have trouble moving your foot or ankle.  · You have a fever or persistent symptoms for more than 2-3 days.  · You have a fever and your symptoms suddenly get worse.  MAKE SURE YOU:   · Understand these instructions.  · Will watch your condition.  · Will get help right away if you are not doing well or get worse.     This information is not intended to replace advice given to you by your health care provider. Make sure you discuss any questions you have with your health care provider.     Document Released: 11/20/2006 Document Revised: 05/07/2013 Document Reviewed: 03/24/2013  Elsevier Interactive Patient Education ©2016 Elsevier Inc.

## 2015-09-22 NOTE — Progress Notes (Signed)
[facility] [place] HISTORY AND PHYSICAL -PAD  History of Present Illness MELISS HANAHAN is a 82 y.o. female patient of Dr. Bridgett Larsson who presents with chief complaint: healing uclers, PAD, and chronic venous insufficiency. The patient has no rest pain. Her bilateral achilles ulcers and left shin ulcers have healed.   The patient notes symptoms have improved. The patient's treatment regimen currently included: maximal medical management.  She is in [facility] since August of 2016 when she had a left total knee replacement by Dr. Gladstone Lighter. Dr. Gladstone Lighter is also managing the healing wound of her left knee, she is taking oral doxycycline now and has been receiving wound care at the nursing facility under Dr. Charlestine Night direction per pt and the nursing staff member with her, Colletta [place].  Pt denies fever or chills, denies pain.  She states she was receiving physical therapy.  Pt states she plans to live at the nursing facility the remainder of her life.  She saw her cardiologist, Dr. Jacinta Shoe, early February or late January 2017, will see him again next week; he apparently adjusted her torsemide due to the edema in her legs.   Pt Diabetic: Yes, pt states her glucose stays less than 200 Pt smoker: former-smoker, quit in 2001   Pt meds include:  Statin :Yes  Betablocker: Yes  ASA: Yes  Other anticoagulants/antiplatelets: coumadin, history if atrial fib    Past Medical History  Diagnosis Date  . Anemia 2008  . DM (diabetes mellitus) ([place]) 1999  . Cataract     bilaterla   . Permanent atrial fibrillation ([place])   . CAD (coronary artery disease)   . CRI (chronic renal insufficiency) 2008  . Hypertension   . Hyperlipemia   . GERD (gastroesophageal reflux disease)   . Arthritis   . Acute respiratory failure with hypoxia ([place]) 06/12/2013    secondary to acute on chronic diastolic failure.  . Wrist fracture, left 06/13/2013    03-12-15 no residual issues   . Acute on chronic congestive heart failure with left ventricular diastolic dysfunction ([place]) 06/11/2013  . Hx of CABG 10/24/2007    Dr. Roxan Hockey  . S/P CABG x 4   . Pacemaker 10/24/2007    Medtronic EnRhythm; implanted for SSS, symptomatic bradycardia, PAF  . Fracture November 2014    Left wrist-03-12-15 no issues now  . Obesity   . Transfusion history   . Venous insufficiency     8-12-16left lower lateral outside healed leg ulcer-scabbed over.    Social History Social History  Substance Use Topics  . Smoking status: Former Smoker -- 1.00 packs/day for 4 years    Types: Cigarettes    Quit date: 10/20/1999  . Smokeless tobacco: Never Used  . Alcohol Use: No    Family History Family History  Problem Relation Age of Onset  . Colon cancer Neg Hx   . Colon polyps Neg Hx   . Hodgkin's lymphoma Mother   . Diabetes Son   . Stroke Father     Past Surgical History  Procedure Laterality Date  . Esophagogastroduodenoscopy  11/07/2007    [id number] Schatzki's ring.  Otherwise normal esophagus   . Appendectomy      APPENDICITIS  . Coronary artery bypass graft  10/24/2007    LIMA to LAD, SVG to DX1, SVG to OM, SVG to distal RCA (Dr. Roxan Hockey)  . Pacemaker insertion  10/24/2007    MDT EnRhythm implanted by Dr Rollene Fare  . Total knee arthroplasty Right  2012  . Coronary angioplasty  11/16/2003    PCI to prox mid RCA (2.75x50mm chromium cobalt vision Guidant stent) (Dr. Marella Chimes)  . Back surgery    . Tonsillectomy      age 39  . Insert / replace / remove pacemaker    . Joint replacement Right   . Colonoscopy with esophagogastroduodenoscopy (egd) N/A 11/25/2012    SLF:Two SMALL AC polyps/Small internal hemorrhoids/The colon IS SLIGHTLY redundant-EGD:Schatzki ring at the gastroesophageal junction/Small hiatal hernia/MILD Non-erosive gastritis  . Transthoracic echocardiogram  08/2012    EF 55-65%, mod conc hypertrophy; mild AV regurg; calcified MV annulus with mod MR; LA mod  diated; RV systolic pressure increased; RA mildly dilated; PA peak pressure 76mmHg  . Nm myocar perf wall motion  2011    dipyridamole myoview - moderate ischemai in mid anteroseptal basal inferior inferioer, mid lateral and basal inferolateral region; abnormal study, EF 41%; high risk scan  . Cardiac catheterization  08/08/2007    3 vessel CAD (LAD, mid Cfx, mid RCA) (Dr. Marella Chimes)  . Tubal ligation    . Cataract extraction, bilateral    . Eye surgery Left Aug. 2015    torned retina repair  . Total knee arthroplasty Left 03/19/2015    Procedure: LEFT TOTAL KNEE ARTHROPLASTY;  Surgeon: Latanya Maudlin, MD;  Location: [facility];  Service: Orthopedics;  Laterality: Left;    Allergies  Allergen Reactions  . Penicillins Other (See Comments)    Pt. Not sure of reaction.    Current Outpatient Prescriptions  Medication Sig Dispense Refill  . ALPRAZolam (XANAX) 0.25 MG tablet Take 0.25 mg by mouth 2 (two) times daily as needed for anxiety.     Marland Kitchen aspirin EC 81 MG tablet Take 81 mg by mouth daily.    . cholecalciferol (VITAMIN D) 1000 UNITS tablet Take 3,000 Units by mouth daily.    . citalopram (CELEXA) 20 MG tablet Take 20 mg by mouth every morning.     . docusate sodium (COLACE) 100 MG capsule Take 100 mg by mouth 2 (two) times daily.    Marland Kitchen doxycycline (VIBRAMYCIN) 100 MG capsule Take 100 mg by mouth daily.    . fentaNYL (DURAGESIC - DOSED MCG/HR) 25 MCG/HR patch Place 25 mcg onto the skin every 3 (three) days.    . folic acid (FOLVITE) 1 MG tablet Take 1 mg by mouth every morning.     . insulin aspart (NOVOLOG) 100 UNIT/ML FlexPen Inject 1-10 Units into the skin 3 (three) times daily with meals. Sliding scale 70-100=0 units 101-150=1 unit 151-200=2 units 201-250= 4 units 251-300=6 units 301-350=8 units 350< = 10 units    . insulin aspart (NOVOLOG) 100 UNIT/ML injection Inject 5 Units into the skin 3 (three) times daily before meals.    . iron polysaccharides (NU-IRON) 150 MG capsule Take  150 mg by mouth daily.    Marland Kitchen LEVEMIR 100 UNIT/ML injection Inject 10 Units into the skin at bedtime.     Marland Kitchen levothyroxine (SYNTHROID, LEVOTHROID) 125 MCG tablet Take 125 mcg by mouth daily before breakfast.    . Melatonin 3 MG CAPS Take 1 capsule by mouth at bedtime.    . methocarbamol (ROBAXIN) 500 MG tablet Take 1 tablet (500 mg total) by mouth every 6 (six) hours as needed for muscle spasms. 40 tablet 1  . metolazone (ZAROXOLYN) 2.5 MG tablet Take 1 tablet (2.5 mg total) by mouth daily. X 3 days only, then stop.    . metoprolol tartrate (LOPRESSOR) 25  MG tablet Take 25 mg by mouth 2 (two) times daily.     . Nutritional Supplements (ARGINAID PO) Take 1 packet by mouth 2 (two) times daily. Mix with 6-8 ounces of water for 28 days for wound healing    . oxyCODONE-acetaminophen (PERCOCET/ROXICET) 5-325 MG per tablet Take 1-2 tablets by mouth every 4 (four) hours as needed for severe pain. 30 tablet 0  . polyethylene glycol (MIRALAX / GLYCOLAX) packet Take 17 g by mouth daily as needed for mild constipation.     . potassium chloride SA (K-DUR,KLOR-CON) 20 MEQ tablet     . pravastatin (PRAVACHOL) 40 MG tablet Take 40 mg by mouth every evening.     . torsemide (DEMADEX) 20 MG tablet Take 2 tablets (40 mg total) by mouth 2 (two) times daily.    . traMADol (ULTRAM) 50 MG tablet Take 50 mg by mouth 3 (three) times daily as needed for moderate pain.     . vitamin C (ASCORBIC ACID) 500 MG tablet Take 500 mg by mouth every morning.    . warfarin (COUMADIN) 5 MG tablet Take 7.5 mg by mouth every evening.      No current facility-administered medications for this visit.   Facility-Administered Medications Ordered in Other Visits  Medication Dose Route Frequency Provider Last Rate Last Dose  . bupivacaine liposome (EXPAREL) 1.3 % injection 266 mg  20 mL Infiltration Once [employer], PA-C        ROS: See HPI for pertinent positives and negatives.   Physical Examination  Filed Vitals:   09/22/15  1024  BP: 133/46  Pulse: 72  Temp: 97.4 F (36.3 C)  TempSrc: Oral  Resp: 16  Height: 5\' 4"  (1.626 m)  Weight: 220 lb (99.791 kg)  SpO2: 93%   Body mass index is 37.74 kg/(m^2).   General: A&O x 3, WD, obese female, seated in wheelchair Pulmonary: Sym exp, good air movt, CTAB, no rales, rhonchi, & wheezing  Cardiac: RRR, Nl S1, S2, no murmur detected   Vascular:  Vessel  Right  Left   Radial  2+Palpable  2+Palpable   Carotid  without bruit  without bruit   Aorta  Not palpable  N/A   Femoral  Not Palpable due to pannus  Not Palpable due to pannus   Popliteal  Not palpable  Not palpable   PT  Not Palpable  Not Palpable   DP  Not Palpable  Not Palpable    Gastrointestinal: soft, NTND, -G/R, - HSM, - masses, - CVAT B  Musculoskeletal: M/S 4/5 throughout , B edema 2+, no ulcers, + chronic venous stasis dermatitis in both lower legs. Healing surgical related wound anterior and below left knee (left total knee replacement).  Neurologic: Pain and light touch intact in extremities except decreased in feet, Motor exam as listed above. Hearing impaired.          Non-Invasive Vascular Imaging: DATE: 09/22/2015 ABI (Date: 09/22/2015)  R: [place] ([place], 04/24/14), DP: biphasic, PT: monophasic, TBI: dampened  L: [place] ([place]), DP: biphasic, PT: monophasic, TBI: dampened   ASSESSMENT: ZIARAH BOCKUS is a 82 y.o. female who presents with chief complaint: healing uclers, PAD, and chronic venous insufficiency.  Her bilateral achilles ulcers and left shin ulcers have healed.  Dr. Gladstone Lighter, ortho, is managing the healing wound just below her left knee. Staff member from the nursing facility with pt and pt indicate the wound is related to the left knee replacement pt had in August 2016. Pt had  physical therapy in the nursing facility. She walks with her walker.  Her ABI's are not accurate due to non compressible vessels in the lower legs, most likely due to chronic  hyperglycemic damage to the endothelium.  Waveforms in both legs have degraded since 04/24/14 at which time right PT was monophasic and right DP was triphasic; left PT and DP were triphasic. Today both DP's are biphasic and both PT's are monophasic.   Chronic venous insufficiency: by pt and staff member account, pt seems to be keeping her legs dependent during the day for the most part, when she is not walking. Legs in dependent position encourage edema in the lower legs; pt already has chronic venous stasis dermatitis secondary to chronic edema. In order to avoid further skin breakdown and eventual venous stasis ulcer formation, pt needs to elevate her legs 3-4x/day for 20 minutes, feet above the level of her heart. Pt and staff member from the nursing facility instructed in this.  After the edema has subsided somewhat, and the below left knee post knee replacement wound has healed, pt should then be properly fitted for knee high compression hose, 20-30 mm Hg graduated compression, donn in the morning, remove at bedtime.     PLAN:  Based on the patient's vascular studies and examination, pt will return to clinic in 6 months with ABI's. I advised pt and the nursing facility staff member with her to notify us immediately if pt develops non healing wounds in her feet/legs.  I discussed in depth with the patient the nature of atherosclerosis, and emphasized the importance of maximal medical management including strict control of blood pressure, blood glucose, and lipid levels, obtaining regular exercise, and continued cessation of smoking.  The patient is aware that without maximal medical management the underlying atherosclerotic disease process will progress, limiting the benefit of any interventions.  The patient was given information about PAD including signs, symptoms, treatment, what symptoms should prompt the patient to seek immediate medical care, and risk reduction measures to take.  Clemon Chambers, RN, MSN, FNP-C [facility] Phone: [phone]  Clinic MD: Scot Dock  09/22/2015 10:20 AM

## 2015-09-27 ENCOUNTER — Ambulatory Visit (INDEPENDENT_AMBULATORY_CARE_PROVIDER_SITE_OTHER): Payer: Medicare HMO | Admitting: Cardiovascular Disease

## 2015-09-27 ENCOUNTER — Encounter: Payer: Self-pay | Admitting: Cardiovascular Disease

## 2015-09-27 VITALS — BP 130/88 | HR 76 | Ht 64.0 in | Wt 225.0 lb

## 2015-09-27 DIAGNOSIS — I482 Chronic atrial fibrillation, unspecified: Secondary | ICD-10-CM

## 2015-09-27 DIAGNOSIS — I25812 Atherosclerosis of bypass graft of coronary artery of transplanted heart without angina pectoris: Secondary | ICD-10-CM

## 2015-09-27 DIAGNOSIS — I5033 Acute on chronic diastolic (congestive) heart failure: Secondary | ICD-10-CM | POA: Diagnosis not present

## 2015-09-27 DIAGNOSIS — I739 Peripheral vascular disease, unspecified: Secondary | ICD-10-CM

## 2015-09-27 DIAGNOSIS — I10 Essential (primary) hypertension: Secondary | ICD-10-CM | POA: Diagnosis not present

## 2015-09-27 DIAGNOSIS — Z950 Presence of cardiac pacemaker: Secondary | ICD-10-CM

## 2015-09-27 MED ORDER — METOLAZONE 2.5 MG PO TABS: ORAL_TABLET | ORAL | Status: DC

## 2015-09-27 NOTE — Progress Notes (Signed)
Patient ID: [name], female   DOB: 01/08/1933, 82 y.o.   MRN: [id number]      SUBJECTIVE: The patient presents for follow-up of acute on chronic diastolic heart failure.  she weighed 248 pounds on 1/23 and weighs 225 pounds today. Her legs feel much less heavy.  She denies chest pain and shortness of breath.  ECG performed in the office today demonstrated atrial fibrillation, heart rate 83 bpm, isolated PVC, and nonspecific ST segment and T-wave abnormalities.   Review of Systems: As per "subjective", otherwise negative.  Allergies  Allergen Reactions  . Penicillins Other (See Comments)    Pt. Not sure of reaction.  Tresa Moore Hcl]     Current Outpatient Prescriptions  Medication Sig Dispense Refill  . ALPRAZolam (XANAX) 0.25 MG tablet Take 0.25 mg by mouth 2 (two) times daily as needed for anxiety.     Marland Kitchen aspirin EC 81 MG tablet Take 81 mg by mouth daily.    . cholecalciferol (VITAMIN D) 1000 UNITS tablet Take 3,000 Units by mouth daily.    . citalopram (CELEXA) 20 MG tablet Take 20 mg by mouth every morning.     . docusate sodium (COLACE) 100 MG capsule Take 100 mg by mouth 2 (two) times daily.    Marland Kitchen doxycycline (VIBRAMYCIN) 100 MG capsule Take 100 mg by mouth daily.    . fentaNYL (DURAGESIC - DOSED MCG/HR) 25 MCG/HR patch Place 25 mcg onto the skin every 3 (three) days.    . folic acid (FOLVITE) 1 MG tablet Take 1 mg by mouth every morning.     . insulin aspart (NOVOLOG) 100 UNIT/ML FlexPen Inject 1-10 Units into the skin 3 (three) times daily with meals. Sliding scale 70-100=0 units 101-150=1 unit 151-200=2 units 201-250= 4 units 251-300=6 units 301-350=8 units 350< = 10 units    . insulin aspart (NOVOLOG) 100 UNIT/ML injection Inject 5 Units into the skin 3 (three) times daily before meals.    . iron polysaccharides (NU-IRON) 150 MG capsule Take 150 mg by mouth daily.    Marland Kitchen LEVEMIR 100 UNIT/ML injection Inject 10 Units into the skin at bedtime.     Marland Kitchen  levothyroxine (SYNTHROID, LEVOTHROID) 125 MCG tablet Take 125 mcg by mouth daily before breakfast.    . Melatonin 3 MG CAPS Take 1 capsule by mouth at bedtime.    . methocarbamol (ROBAXIN) 500 MG tablet Take 1 tablet (500 mg total) by mouth every 6 (six) hours as needed for muscle spasms. 40 tablet 1  . metoprolol tartrate (LOPRESSOR) 25 MG tablet Take 25 mg by mouth 2 (two) times daily.     . Nutritional Supplements (ARGINAID PO) Take 1 packet by mouth 2 (two) times daily. Mix with 6-8 ounces of water for 28 days for wound healing    . polyethylene glycol (MIRALAX / GLYCOLAX) packet Take 17 g by mouth daily as needed for mild constipation.     . potassium chloride SA (K-DUR,KLOR-CON) 20 MEQ tablet     . pravastatin (PRAVACHOL) 40 MG tablet Take 40 mg by mouth every evening.     . torsemide (DEMADEX) 20 MG tablet Take 80 mg by mouth 2 (two) times daily.    . traMADol (ULTRAM) 50 MG tablet Take 50 mg by mouth 3 (three) times daily as needed for moderate pain.     . vitamin C (ASCORBIC ACID) 500 MG tablet Take 500 mg by mouth every morning.    . warfarin (COUMADIN) 5 MG tablet Take  7.5 mg by mouth every evening.      No current facility-administered medications for this visit.   Facility-Administered Medications Ordered in Other Visits  Medication Dose Route Frequency Provider Last Rate Last Dose  . bupivacaine liposome (EXPAREL) 1.3 % injection 266 mg  20 mL Infiltration Once Ardeen Jourdain, PA-C        Past Medical History  Diagnosis Date  . Anemia 2008  . DM (diabetes mellitus) ([place]) 1999  . Cataract     bilaterla   . Permanent atrial fibrillation ([place])   . CAD (coronary artery disease)   . CRI (chronic renal insufficiency) 2008  . Hypertension   . Hyperlipemia   . GERD (gastroesophageal reflux disease)   . Arthritis   . Acute respiratory failure with hypoxia ([place]) 06/12/2013    secondary to acute on chronic diastolic failure.  . Wrist fracture, left 06/13/2013    03-12-15 no  residual issues  . Acute on chronic congestive heart failure with left ventricular diastolic dysfunction ([place]) 06/11/2013  . Hx of CABG 10/24/2007    Dr. Roxan Hockey  . S/P CABG x 4   . Pacemaker 10/24/2007    Medtronic EnRhythm; implanted for SSS, symptomatic bradycardia, PAF  . Fracture November 2014    Left wrist-03-12-15 no issues now  . Obesity   . Transfusion history   . Venous insufficiency     8-12-16left lower lateral outside healed leg ulcer-scabbed over.    Past Surgical History  Procedure Laterality Date  . Esophagogastroduodenoscopy  11/07/2007    [id number] Schatzki's ring.  Otherwise normal esophagus   . Appendectomy      APPENDICITIS  . Coronary artery bypass graft  10/24/2007    LIMA to LAD, SVG to DX1, SVG to OM, SVG to distal RCA (Dr. Roxan Hockey)  . Pacemaker insertion  10/24/2007    MDT EnRhythm implanted by Dr Rollene Fare  . Total knee arthroplasty Right     2012  . Coronary angioplasty  11/16/2003    PCI to prox mid RCA (2.75x21mm chromium cobalt vision Guidant stent) (Dr. Marella Chimes)  . Back surgery    . Tonsillectomy      age 47  . Insert / replace / remove pacemaker    . Joint replacement Right   . Colonoscopy with esophagogastroduodenoscopy (egd) N/A 11/25/2012    SLF:Two SMALL AC polyps/Small internal hemorrhoids/The colon IS SLIGHTLY redundant-EGD:Schatzki ring at the gastroesophageal junction/Small hiatal hernia/MILD Non-erosive gastritis  . Transthoracic echocardiogram  08/2012    EF 55-65%, mod conc hypertrophy; mild AV regurg; calcified MV annulus with mod MR; LA mod diated; RV systolic pressure increased; RA mildly dilated; PA peak pressure 1mmHg  . Nm myocar perf wall motion  2011    dipyridamole myoview - moderate ischemai in mid anteroseptal basal inferior inferioer, mid lateral and basal inferolateral region; abnormal study, EF 41%; high risk scan  . Cardiac catheterization  08/08/2007    3 vessel CAD (LAD, mid Cfx, mid RCA) (Dr. Marella Chimes)  .  Tubal ligation    . Cataract extraction, bilateral    . Eye surgery Left Aug. 2015    torned retina repair  . Total knee arthroplasty Left 03/19/2015    Procedure: LEFT TOTAL KNEE ARTHROPLASTY;  Surgeon: Latanya Maudlin, MD;  Location: [facility];  Service: Orthopedics;  Laterality: Left;    Social History   Social History  . Marital Status: Widowed    Spouse Name: N/A  . Number of Children: 4  . Years of Education: N/A  Occupational History  .     Social History Main Topics  . Smoking status: Former Smoker -- 1.00 packs/day for 4 years    Types: Cigarettes    Quit date: 10/20/1999  . Smokeless tobacco: Never Used  . Alcohol Use: No  . Drug Use: No  . Sexual Activity: No   Other Topics Concern  . Not on file   Social History Narrative   Nursing home resident     Filed Vitals:   09/27/15 1357  BP: 130/88  Pulse: 76  Height: 5\' 4"  (1.626 m)  Weight: 225 lb (102.059 kg)  SpO2: 98%    PHYSICAL EXAM General: NAD  Neck: No JVD, no thyromegaly.  Lungs: Normal respiratory effort and end-expiratory wheezes.  CV: Nondisplaced PMI. Irregular rhythm, normal rate, normal S1/S2, no S3, no murmur. 1+ pitting pretibial edema b/l with scaling noted. Abdomen: Soft, nontender, obese.  Neurologic: Alert and oriented.  Psych: Normal affect. Skin: Normal. Musculoskeletal: No gross deformities.   ECG: Most recent ECG reviewed.      ASSESSMENT AND PLAN: 1. CAD/CABG: She remains symptomatically stable. Normal LV systolic function by echocardiogram. Myocardial perfusion images on 10/19/14 demonstrated a mild to moderate amount of anteroapical and anterolateral wall ischemia with anterolateral wall hypokinesis. Left ventricular systolic function was moderately reduced, calculated LVEF 37%.  Echocardiogram on 10/28/14 demonstrated normal left ventricular systolic function, EF 0000000, moderate concentric LVH, high ventricular filling pressures, ischemic wall motion abnormalities,  mild to moderate mitral regurgitation, and mild tricuspid, aortic, and pulmonic regurgitation. Continue beta blocker and pravastatin. ASA previously stopped by a different provider.  2. Permanent atrial fibrillation: Rate is controlled on metoprolol 25 mg twice daily. Maintained on warfarin, with INR managed by pharmacy.   3. Pacemaker for SSS: Normal device function on 10/16/2014. Patient is in atrial fibrillation 100% of the time with no high ventricular rates noted.  4. Acute on chronic diastolic heart failure: Euvolemic. Remains on torsemide 80 mg bid. Would recommend metolazone 2.5 mg on Mon and Fri.  5. Essential HTN: Reasonably controlled. No changes.  6. Peripheral vascular disease: Follows with Dr. Bridgett Larsson.   Dispo: f/u 3 months.  Kate Sable, M.D., F.A.C.C.

## 2015-09-27 NOTE — Patient Instructions (Signed)
   Metolazone 2.5mg  on Monday & Friday only.  Continue all other medications.   Follow up in  3 months

## 2015-10-04 DIAGNOSIS — S81009A Unspecified open wound, unspecified knee, initial encounter: Secondary | ICD-10-CM | POA: Insufficient documentation

## 2015-11-05 ENCOUNTER — Encounter: Payer: Medicare HMO | Admitting: Internal Medicine

## 2015-12-30 ENCOUNTER — Encounter: Payer: Self-pay | Admitting: Cardiovascular Disease

## 2015-12-30 ENCOUNTER — Ambulatory Visit (INDEPENDENT_AMBULATORY_CARE_PROVIDER_SITE_OTHER): Payer: Medicare HMO | Admitting: Cardiovascular Disease

## 2015-12-30 ENCOUNTER — Observation Stay (HOSPITAL_COMMUNITY)
Admission: AD | Admit: 2015-12-30 | Discharge: 2016-01-01 | Disposition: A | Discharge status: Skilled Nursing Facility | Payer: Medicare HMO | Source: Ambulatory Visit | Attending: Internal Medicine | Admitting: Internal Medicine

## 2015-12-30 ENCOUNTER — Encounter (HOSPITAL_COMMUNITY): Payer: Self-pay | Admitting: General Practice

## 2015-12-30 VITALS — BP 130/64 | HR 108 | Ht 64.0 in | Wt 215.0 lb

## 2015-12-30 DIAGNOSIS — I4821 Permanent atrial fibrillation: Secondary | ICD-10-CM | POA: Diagnosis present

## 2015-12-30 DIAGNOSIS — I482 Chronic atrial fibrillation, unspecified: Secondary | ICD-10-CM

## 2015-12-30 DIAGNOSIS — I2510 Atherosclerotic heart disease of native coronary artery without angina pectoris: Secondary | ICD-10-CM | POA: Insufficient documentation

## 2015-12-30 DIAGNOSIS — I5033 Acute on chronic diastolic (congestive) heart failure: Secondary | ICD-10-CM | POA: Diagnosis present

## 2015-12-30 DIAGNOSIS — Z9842 Cataract extraction status, left eye: Secondary | ICD-10-CM | POA: Diagnosis not present

## 2015-12-30 DIAGNOSIS — R32 Unspecified urinary incontinence: Secondary | ICD-10-CM | POA: Insufficient documentation

## 2015-12-30 DIAGNOSIS — I10 Essential (primary) hypertension: Secondary | ICD-10-CM | POA: Diagnosis present

## 2015-12-30 DIAGNOSIS — L03119 Cellulitis of unspecified part of limb: Secondary | ICD-10-CM

## 2015-12-30 DIAGNOSIS — L03116 Cellulitis of left lower limb: Secondary | ICD-10-CM | POA: Insufficient documentation

## 2015-12-30 DIAGNOSIS — Z888 Allergy status to other drugs, medicaments and biological substances status: Secondary | ICD-10-CM | POA: Diagnosis not present

## 2015-12-30 DIAGNOSIS — I340 Nonrheumatic mitral (valve) insufficiency: Secondary | ICD-10-CM | POA: Insufficient documentation

## 2015-12-30 DIAGNOSIS — Z833 Family history of diabetes mellitus: Secondary | ICD-10-CM | POA: Insufficient documentation

## 2015-12-30 DIAGNOSIS — E785 Hyperlipidemia, unspecified: Secondary | ICD-10-CM | POA: Diagnosis not present

## 2015-12-30 DIAGNOSIS — E119 Type 2 diabetes mellitus without complications: Secondary | ICD-10-CM

## 2015-12-30 DIAGNOSIS — N183 Chronic kidney disease, stage 3 unspecified: Secondary | ICD-10-CM

## 2015-12-30 DIAGNOSIS — I872 Venous insufficiency (chronic) (peripheral): Secondary | ICD-10-CM | POA: Insufficient documentation

## 2015-12-30 DIAGNOSIS — I371 Nonrheumatic pulmonary valve insufficiency: Secondary | ICD-10-CM | POA: Insufficient documentation

## 2015-12-30 DIAGNOSIS — K219 Gastro-esophageal reflux disease without esophagitis: Secondary | ICD-10-CM | POA: Diagnosis not present

## 2015-12-30 DIAGNOSIS — Z950 Presence of cardiac pacemaker: Secondary | ICD-10-CM | POA: Diagnosis not present

## 2015-12-30 DIAGNOSIS — Z794 Long term (current) use of insulin: Secondary | ICD-10-CM | POA: Diagnosis not present

## 2015-12-30 DIAGNOSIS — L02419 Cutaneous abscess of limb, unspecified: Secondary | ICD-10-CM | POA: Diagnosis present

## 2015-12-30 DIAGNOSIS — Z87891 Personal history of nicotine dependence: Secondary | ICD-10-CM | POA: Insufficient documentation

## 2015-12-30 DIAGNOSIS — Z880 Allergy status to penicillin: Secondary | ICD-10-CM | POA: Insufficient documentation

## 2015-12-30 DIAGNOSIS — Z823 Family history of stroke: Secondary | ICD-10-CM | POA: Diagnosis not present

## 2015-12-30 DIAGNOSIS — L02416 Cutaneous abscess of left lower limb: Secondary | ICD-10-CM | POA: Diagnosis not present

## 2015-12-30 DIAGNOSIS — Z9841 Cataract extraction status, right eye: Secondary | ICD-10-CM | POA: Diagnosis not present

## 2015-12-30 DIAGNOSIS — Z951 Presence of aortocoronary bypass graft: Secondary | ICD-10-CM | POA: Diagnosis not present

## 2015-12-30 DIAGNOSIS — I25812 Atherosclerosis of bypass graft of coronary artery of transplanted heart without angina pectoris: Secondary | ICD-10-CM | POA: Diagnosis not present

## 2015-12-30 DIAGNOSIS — E039 Hypothyroidism, unspecified: Secondary | ICD-10-CM | POA: Diagnosis not present

## 2015-12-30 DIAGNOSIS — Z6839 Body mass index (BMI) 39.0-39.9, adult: Secondary | ICD-10-CM | POA: Insufficient documentation

## 2015-12-30 DIAGNOSIS — E669 Obesity, unspecified: Secondary | ICD-10-CM | POA: Insufficient documentation

## 2015-12-30 DIAGNOSIS — IMO0001 Reserved for inherently not codable concepts without codable children: Secondary | ICD-10-CM

## 2015-12-30 DIAGNOSIS — I5043 Acute on chronic combined systolic (congestive) and diastolic (congestive) heart failure: Secondary | ICD-10-CM | POA: Insufficient documentation

## 2015-12-30 DIAGNOSIS — E1122 Type 2 diabetes mellitus with diabetic chronic kidney disease: Principal | ICD-10-CM | POA: Insufficient documentation

## 2015-12-30 DIAGNOSIS — E038 Other specified hypothyroidism: Secondary | ICD-10-CM

## 2015-12-30 DIAGNOSIS — Z7982 Long term (current) use of aspirin: Secondary | ICD-10-CM | POA: Diagnosis not present

## 2015-12-30 DIAGNOSIS — I130 Hypertensive heart and chronic kidney disease with heart failure and stage 1 through stage 4 chronic kidney disease, or unspecified chronic kidney disease: Secondary | ICD-10-CM | POA: Insufficient documentation

## 2015-12-30 DIAGNOSIS — Z7901 Long term (current) use of anticoagulants: Secondary | ICD-10-CM | POA: Insufficient documentation

## 2015-12-30 DIAGNOSIS — Z96653 Presence of artificial knee joint, bilateral: Secondary | ICD-10-CM | POA: Diagnosis not present

## 2015-12-30 DIAGNOSIS — I509 Heart failure, unspecified: Secondary | ICD-10-CM

## 2015-12-30 DIAGNOSIS — I739 Peripheral vascular disease, unspecified: Secondary | ICD-10-CM

## 2015-12-30 DIAGNOSIS — Z807 Family history of other malignant neoplasms of lymphoid, hematopoietic and related tissues: Secondary | ICD-10-CM | POA: Insufficient documentation

## 2015-12-30 LAB — COMPREHENSIVE METABOLIC PANEL
ALT: 16 U/L (ref 14–54)
ANION GAP: 6 (ref 5–15)
AST: 19 U/L (ref 15–41)
Albumin: 2.6 g/dL — ABNORMAL LOW (ref 3.5–5.0)
Alkaline Phosphatase: 61 U/L (ref 38–126)
BUN: 29 mg/dL — ABNORMAL HIGH (ref 6–20)
CHLORIDE: 105 mmol/L (ref 101–111)
CO2: 24 mmol/L (ref 22–32)
Calcium: 8.8 mg/dL — ABNORMAL LOW (ref 8.9–10.3)
Creatinine, Ser: 1.4 mg/dL — ABNORMAL HIGH (ref 0.44–1.00)
GFR, EST AFRICAN AMERICAN: 40 mL/min — AB (ref >60)
GFR, EST NON AFRICAN AMERICAN: 34 mL/min — AB (ref >60)
Glucose, Bld: 99 mg/dL (ref 65–99)
POTASSIUM: 4.6 mmol/L (ref 3.5–5.1)
SODIUM: 135 mmol/L (ref 135–145)
Total Bilirubin: 0.4 mg/dL (ref 0.3–1.2)
Total Protein: 7.3 g/dL (ref 6.5–8.1)

## 2015-12-30 LAB — CBC WITH DIFFERENTIAL/PLATELET
Basophils Absolute: 0 10*3/uL (ref 0.0–0.1)
Basophils Relative: 0 %
EOS ABS: 0.2 10*3/uL (ref 0.0–0.7)
Eosinophils Relative: 3 %
HEMATOCRIT: 30.9 % — AB (ref 36.0–46.0)
HEMOGLOBIN: 9.4 g/dL — AB (ref 12.0–15.0)
LYMPHS ABS: 1.2 10*3/uL (ref 0.7–4.0)
LYMPHS PCT: 17 %
MCH: 25.2 pg — AB (ref 26.0–34.0)
MCHC: 30.4 g/dL (ref 30.0–36.0)
MCV: 82.8 fL (ref 78.0–100.0)
MONOS PCT: 4 %
Monocytes Absolute: 0.3 10*3/uL (ref 0.1–1.0)
NEUTROS ABS: 5.5 10*3/uL (ref 1.7–7.7)
NEUTROS PCT: 76 %
Platelets: 307 10*3/uL (ref 150–400)
RBC: 3.73 MIL/uL — AB (ref 3.87–5.11)
RDW: 20 % — ABNORMAL HIGH (ref 11.5–15.5)
WBC: 7.3 10*3/uL (ref 4.0–10.5)

## 2015-12-30 LAB — GLUCOSE, CAPILLARY
GLUCOSE-CAPILLARY: 156 mg/dL — AB (ref 65–99)
GLUCOSE-CAPILLARY: 175 mg/dL — AB (ref 65–99)

## 2015-12-30 LAB — BRAIN NATRIURETIC PEPTIDE: B NATRIURETIC PEPTIDE 5: 616 pg/mL — AB (ref 0.0–100.0)

## 2015-12-30 LAB — PROTIME-INR
INR: 2.39 — ABNORMAL HIGH (ref 0.00–1.49)
PROTHROMBIN TIME: 25.8 s — AB (ref 11.6–15.2)

## 2015-12-30 LAB — MRSA PCR SCREENING: MRSA by PCR: NEGATIVE

## 2015-12-30 LAB — MAGNESIUM: Magnesium: 2 mg/dL (ref 1.7–2.4)

## 2015-12-30 MED ORDER — DOCUSATE SODIUM 100 MG PO CAPS
100.0000 mg | ORAL_CAPSULE | Freq: Two times a day (BID) | ORAL | Status: DC
  Administered 2015-12-30 – 2016-01-01 (×4): 100 mg via ORAL
  Filled 2015-12-30 (×4): qty 1

## 2015-12-30 MED ORDER — PRAVASTATIN SODIUM 40 MG PO TABS
40.0000 mg | ORAL_TABLET | Freq: Every evening | ORAL | Status: DC
  Administered 2015-12-30 – 2015-12-31 (×2): 40 mg via ORAL
  Filled 2015-12-30 (×2): qty 1

## 2015-12-30 MED ORDER — POLYETHYLENE GLYCOL 3350 17 G PO PACK: 17.0000 g | PACK | Freq: Every day | ORAL | Status: DC | PRN

## 2015-12-30 MED ORDER — FOLIC ACID 1 MG PO TABS
1.0000 mg | ORAL_TABLET | Freq: Every morning | ORAL | Status: DC
  Administered 2015-12-31 – 2016-01-01 (×2): 1 mg via ORAL
  Filled 2015-12-30 (×2): qty 1

## 2015-12-30 MED ORDER — METOPROLOL TARTRATE 25 MG PO TABS
25.0000 mg | ORAL_TABLET | Freq: Two times a day (BID) | ORAL | Status: DC
  Administered 2015-12-30 – 2016-01-01 (×4): 25 mg via ORAL
  Filled 2015-12-30 (×4): qty 1

## 2015-12-30 MED ORDER — WARFARIN SODIUM 5 MG PO TABS
5.0000 mg | ORAL_TABLET | Freq: Once | ORAL | Status: AC
  Administered 2015-12-30: 5 mg via ORAL
  Filled 2015-12-30: qty 1

## 2015-12-30 MED ORDER — LEVOTHYROXINE SODIUM 25 MCG PO TABS
125.0000 ug | ORAL_TABLET | Freq: Every day | ORAL | Status: DC
  Administered 2015-12-31 – 2016-01-01 (×2): 125 ug via ORAL
  Filled 2015-12-30 (×2): qty 1

## 2015-12-30 MED ORDER — VANCOMYCIN HCL IN DEXTROSE 1-5 GM/200ML-% IV SOLN
1000.0000 mg | Freq: Once | INTRAVENOUS | Status: AC
  Administered 2015-12-30: 1000 mg via INTRAVENOUS
  Filled 2015-12-30: qty 200

## 2015-12-30 MED ORDER — SODIUM CHLORIDE 0.9% FLUSH
3.0000 mL | Freq: Two times a day (BID) | INTRAVENOUS | Status: DC
  Administered 2015-12-31 – 2016-01-01 (×2): 3 mL via INTRAVENOUS

## 2015-12-30 MED ORDER — INSULIN DETEMIR 100 UNIT/ML ~~LOC~~ SOLN
5.0000 [IU] | Freq: Every day | SUBCUTANEOUS | Status: DC
  Administered 2015-12-30 – 2015-12-31 (×2): 5 [IU] via SUBCUTANEOUS
  Filled 2015-12-30 (×4): qty 0.05

## 2015-12-30 MED ORDER — FUROSEMIDE 10 MG/ML IJ SOLN
40.0000 mg | Freq: Two times a day (BID) | INTRAMUSCULAR | Status: DC
  Administered 2015-12-30 – 2016-01-01 (×4): 40 mg via INTRAVENOUS
  Filled 2015-12-30 (×4): qty 4

## 2015-12-30 MED ORDER — METOPROLOL TARTRATE 5 MG/5ML IV SOLN
5.0000 mg | Freq: Once | INTRAVENOUS | Status: AC
  Administered 2015-12-30: 5 mg via INTRAVENOUS
  Filled 2015-12-30: qty 5

## 2015-12-30 MED ORDER — INSULIN ASPART 100 UNIT/ML ~~LOC~~ SOLN
0.0000 [IU] | Freq: Three times a day (TID) | SUBCUTANEOUS | Status: DC
  Administered 2015-12-31 – 2016-01-01 (×3): 3 [IU] via SUBCUTANEOUS

## 2015-12-30 MED ORDER — POLYSACCHARIDE IRON COMPLEX 150 MG PO CAPS
150.0000 mg | ORAL_CAPSULE | Freq: Every day | ORAL | Status: DC
  Administered 2015-12-30 – 2016-01-01 (×3): 150 mg via ORAL
  Filled 2015-12-30 (×3): qty 1

## 2015-12-30 MED ORDER — VITAMIN D 1000 UNITS PO TABS
3000.0000 [IU] | ORAL_TABLET | Freq: Every day | ORAL | Status: DC
  Administered 2015-12-30 – 2016-01-01 (×3): 3000 [IU] via ORAL
  Filled 2015-12-30 (×5): qty 3

## 2015-12-30 MED ORDER — WARFARIN - PHARMACIST DOSING INPATIENT
Status: DC
  Administered 2015-12-31: 16:00:00

## 2015-12-30 MED ORDER — VANCOMYCIN HCL 10 G IV SOLR
1250.0000 mg | INTRAVENOUS | Status: DC
  Administered 2015-12-31: 1250 mg via INTRAVENOUS
  Filled 2015-12-30 (×3): qty 1250

## 2015-12-30 MED ORDER — FENTANYL 25 MCG/HR TD PT72
25.0000 ug | MEDICATED_PATCH | TRANSDERMAL | Status: DC
  Administered 2015-12-30: 25 ug via TRANSDERMAL
  Filled 2015-12-30: qty 1

## 2015-12-30 MED ORDER — METHOCARBAMOL 500 MG PO TABS: 500.0000 mg | ORAL_TABLET | Freq: Four times a day (QID) | ORAL | Status: DC | PRN

## 2015-12-30 MED ORDER — SODIUM CHLORIDE 0.9% FLUSH: 3.0000 mL | INTRAVENOUS | Status: DC | PRN

## 2015-12-30 MED ORDER — ASPIRIN EC 81 MG PO TBEC
81.0000 mg | DELAYED_RELEASE_TABLET | Freq: Every day | ORAL | Status: DC
  Administered 2015-12-30 – 2016-01-01 (×3): 81 mg via ORAL
  Filled 2015-12-30 (×3): qty 1

## 2015-12-30 MED ORDER — CITALOPRAM HYDROBROMIDE 20 MG PO TABS
20.0000 mg | ORAL_TABLET | Freq: Every morning | ORAL | Status: DC
  Administered 2015-12-31 – 2016-01-01 (×2): 20 mg via ORAL
  Filled 2015-12-30 (×2): qty 1

## 2015-12-30 MED ORDER — TRAMADOL HCL 50 MG PO TABS
50.0000 mg | ORAL_TABLET | Freq: Three times a day (TID) | ORAL | Status: DC | PRN
  Administered 2015-12-30 – 2016-01-01 (×3): 50 mg via ORAL
  Filled 2015-12-30 (×3): qty 1

## 2015-12-30 MED ORDER — VITAMIN C 500 MG PO TABS
500.0000 mg | ORAL_TABLET | Freq: Every morning | ORAL | Status: DC
  Administered 2015-12-31 – 2016-01-01 (×2): 500 mg via ORAL
  Filled 2015-12-30 (×2): qty 1

## 2015-12-30 MED ORDER — HEPARIN SODIUM (PORCINE) 5000 UNIT/ML IJ SOLN: 5000.0000 [IU] | Freq: Three times a day (TID) | INTRAMUSCULAR | Status: DC

## 2015-12-30 MED ORDER — SODIUM CHLORIDE 0.9 % IV SOLN: 250.0000 mL | INTRAVENOUS | Status: DC | PRN

## 2015-12-30 MED ORDER — ALPRAZOLAM 0.25 MG PO TABS
0.2500 mg | ORAL_TABLET | Freq: Two times a day (BID) | ORAL | Status: DC | PRN
  Administered 2015-12-30 – 2015-12-31 (×2): 0.25 mg via ORAL
  Filled 2015-12-30 (×2): qty 1

## 2015-12-30 NOTE — Progress Notes (Addendum)
Pharmacy Antibiotic Note  [name] is a 82 y.o. female admitted on 12/30/2015 with cellulitis.  Pharmacy has been consulted for Vancomycin dosing. Home med list pending.  Recent Levaquin and Linezolid as out patient.  Plan: Initial dose of Vancomycin 1gm IV x 1 ordered.  Will give additional 1gm dose to complete 2gm load. Obesity/Normalized CrCl Vancomycin dosing protocol will be initiated with an estimated normalized CrCl = 66ml/min.  Vancomycin 1250 IV every 24 hours.  Goal trough 10-15 mcg/mL. Monitor labs, micro and vitals.   Height: 5\' 4"  (162.6 cm) Weight: 229 lb (103.874 kg) IBW/kg (Calculated) : 54.7  Temp (24hrs), Avg:97.6 F (36.4 C), Min:97.6 F (36.4 C), Max:97.6 F (36.4 C)   Recent Labs Lab 12/30/15 1735  WBC 7.3  CREATININE 1.40*    Estimated Creatinine Clearance: 37 mL/min (by C-G formula based on Cr of 1.4).    Allergies  Allergen Reactions  . Penicillins Other (See Comments)    Pt. Not sure of reaction.  Tresa Moore Hcl]     Antimicrobials this admission: Vancomycin 6/1 >>   Dose adjustments this admission:   Microbiology results: 6/1 MRSA PCR: pending Recent MRSA Cx per MD Note  Thank you for allowing pharmacy to be a part of this patient's care.  Pricilla Larsson 12/30/2015 7:00 PM

## 2015-12-30 NOTE — Progress Notes (Signed)
Patient ID: [name], female   DOB: Jul 16, 1933, 82 y.o.   MRN: [id number]      SUBJECTIVE: The patient presents for follow-up of acute on chronic diastolic heart failure.  Wt 215 lbs (225 2/27)  She had been on torsemide 80 mg twice daily but this was stopped on 12/11/15 by her PCP. She is scheduled to see a nephrologist tomorrow. She is being treated for MRSA/left leg wound with Levaquin. Labs provided to me demonstrated the following:  5/13: BNP 2460  5/1: BUN 133, creatinine 1.81, potassium 3.3  5/8: BUN 129, creatinine 1.61  5/12 BUN 132, creatinine 1.67  She reportedly had labs drawn on 5/29 but these are not provided to me. She denies chest pain and shortness of breath.    Review of Systems: As per "subjective", otherwise negative.  Allergies  Allergen Reactions  . Penicillins Other (See Comments)    Pt. Not sure of reaction.  Tresa Moore Hcl]     Current Outpatient Prescriptions  Medication Sig Dispense Refill  . ALPRAZolam (XANAX) 0.25 MG tablet Take 0.25 mg by mouth 2 (two) times daily as needed for anxiety.     Marland Kitchen aspirin EC 81 MG tablet Take 81 mg by mouth daily.    . cholecalciferol (VITAMIN D) 1000 UNITS tablet Take 3,000 Units by mouth daily.    . citalopram (CELEXA) 20 MG tablet Take 20 mg by mouth every morning.     . docusate sodium (COLACE) 100 MG capsule Take 100 mg by mouth 2 (two) times daily.    . fentaNYL (DURAGESIC - DOSED MCG/HR) 25 MCG/HR patch Place 25 mcg onto the skin every 3 (three) days.    . folic acid (FOLVITE) 1 MG tablet Take 1 mg by mouth every morning.     . insulin aspart (NOVOLOG) 100 UNIT/ML FlexPen Inject 1-10 Units into the skin 3 (three) times daily with meals. Sliding scale 70-100=0 units 101-150=1 unit 151-200=2 units 201-250= 4 units 251-300=6 units 301-350=8 units 350< = 10 units    . insulin aspart (NOVOLOG) 100 UNIT/ML injection Inject 5 Units into the skin 3 (three) times daily before meals.    . iron  polysaccharides (NU-IRON) 150 MG capsule Take 150 mg by mouth daily.    Marland Kitchen LEVEMIR 100 UNIT/ML injection Inject 10 Units into the skin at bedtime.     Marland Kitchen levofloxacin (LEVAQUIN) 500 MG tablet Take 500 mg by mouth daily.    Marland Kitchen levothyroxine (SYNTHROID, LEVOTHROID) 125 MCG tablet Take 125 mcg by mouth daily before breakfast.    . linezolid (ZYVOX) 600 MG tablet Take 600 mg by mouth 2 (two) times daily.    . Melatonin 3 MG CAPS Take 1 capsule by mouth at bedtime.    . methocarbamol (ROBAXIN) 500 MG tablet Take 1 tablet (500 mg total) by mouth every 6 (six) hours as needed for muscle spasms. 40 tablet 1  . metolazone (ZAROXOLYN) 2.5 MG tablet Take one tab by mouth on Monday & Friday.    . metoprolol tartrate (LOPRESSOR) 25 MG tablet Take 25 mg by mouth 2 (two) times daily.     . Nutritional Supplements (ARGINAID PO) Take 1 packet by mouth 2 (two) times daily. Mix with 6-8 ounces of water for 28 days for wound healing    . polyethylene glycol (MIRALAX / GLYCOLAX) packet Take 17 g by mouth daily as needed for mild constipation.     . potassium chloride SA (K-DUR,KLOR-CON) 20 MEQ tablet     .  pravastatin (PRAVACHOL) 40 MG tablet Take 40 mg by mouth every evening.     . traMADol (ULTRAM) 50 MG tablet Take 50 mg by mouth 3 (three) times daily as needed for moderate pain.     . vitamin C (ASCORBIC ACID) 500 MG tablet Take 500 mg by mouth every morning.    . warfarin (COUMADIN) 5 MG tablet Take 7.5 mg by mouth every evening.      No current facility-administered medications for this visit.   Facility-Administered Medications Ordered in Other Visits  Medication Dose Route Frequency Provider Last Rate Last Dose  . bupivacaine liposome (EXPAREL) 1.3 % injection 266 mg  20 mL Infiltration Once Ardeen Jourdain, PA-C        Past Medical History  Diagnosis Date  . Anemia 2008  . DM (diabetes mellitus) ([place]) 1999  . Cataract     bilaterla   . Permanent atrial fibrillation ([place])   . CAD (coronary artery  disease)   . CRI (chronic renal insufficiency) 2008  . Hypertension   . Hyperlipemia   . GERD (gastroesophageal reflux disease)   . Arthritis   . Acute respiratory failure with hypoxia ([place]) 06/12/2013    secondary to acute on chronic diastolic failure.  . Wrist fracture, left 06/13/2013    03-12-15 no residual issues  . Acute on chronic congestive heart failure with left ventricular diastolic dysfunction ([place]) 06/11/2013  . Hx of CABG 10/24/2007    Dr. Roxan Hockey  . S/P CABG x 4   . Pacemaker 10/24/2007    Medtronic EnRhythm; implanted for SSS, symptomatic bradycardia, PAF  . Fracture November 2014    Left wrist-03-12-15 no issues now  . Obesity   . Transfusion history   . Venous insufficiency     8-12-16left lower lateral outside healed leg ulcer-scabbed over.    Past Surgical History  Procedure Laterality Date  . Esophagogastroduodenoscopy  11/07/2007    [id number] Schatzki's ring.  Otherwise normal esophagus   . Appendectomy      APPENDICITIS  . Coronary artery bypass graft  10/24/2007    LIMA to LAD, SVG to DX1, SVG to OM, SVG to distal RCA (Dr. Roxan Hockey)  . Pacemaker insertion  10/24/2007    MDT EnRhythm implanted by Dr Rollene Fare  . Total knee arthroplasty Right     2012  . Coronary angioplasty  11/16/2003    PCI to prox mid RCA (2.75x73mm chromium cobalt vision Guidant stent) (Dr. Marella Chimes)  . Back surgery    . Tonsillectomy      age 7  . Insert / replace / remove pacemaker    . Joint replacement Right   . Colonoscopy with esophagogastroduodenoscopy (egd) N/A 11/25/2012    SLF:Two SMALL AC polyps/Small internal hemorrhoids/The colon IS SLIGHTLY redundant-EGD:Schatzki ring at the gastroesophageal junction/Small hiatal hernia/MILD Non-erosive gastritis  . Transthoracic echocardiogram  08/2012    EF 55-65%, mod conc hypertrophy; mild AV regurg; calcified MV annulus with mod MR; LA mod diated; RV systolic pressure increased; RA mildly dilated; PA peak pressure 10mmHg    . Nm myocar perf wall motion  2011    dipyridamole myoview - moderate ischemai in mid anteroseptal basal inferior inferioer, mid lateral and basal inferolateral region; abnormal study, EF 41%; high risk scan  . Cardiac catheterization  08/08/2007    3 vessel CAD (LAD, mid Cfx, mid RCA) (Dr. Marella Chimes)  . Tubal ligation    . Cataract extraction, bilateral    . Eye surgery Left Aug. 2015  torned retina repair  . Total knee arthroplasty Left 03/19/2015    Procedure: LEFT TOTAL KNEE ARTHROPLASTY;  Surgeon: Latanya Maudlin, MD;  Location: [facility];  Service: Orthopedics;  Laterality: Left;    Social History   Social History  . Marital Status: Widowed    Spouse Name: N/A  . Number of Children: 4  . Years of Education: N/A   Occupational History  .     Social History Main Topics  . Smoking status: Former Smoker -- 1.00 packs/day for 4 years    Types: Cigarettes    Quit date: 10/20/1999  . Smokeless tobacco: Never Used  . Alcohol Use: No  . Drug Use: No  . Sexual Activity: No   Other Topics Concern  . Not on file   Social History Narrative   Nursing home resident     Filed Vitals:   12/30/15 1318  BP: 130/64  Pulse: 108  Height: 5\' 4"  (1.626 m)  Weight: 215 lb (97.523 kg)  SpO2: 95%    PHYSICAL EXAM General: NAD  Neck: +JVD, no thyromegaly.  Lungs: Crackles b/l.  CV: Nondisplaced PMI. Irregular rhythm, rate slightly tachycardic, normal S1/S2, no S3, no murmur. 2+ pitting pretibial edema b/l with scaling noted and left leg wound. Abdomen: Soft, nontender, obese.  Neurologic: Alert and oriented.  Psych: Normal affect. Skin: Normal.    ECG: Most recent ECG reviewed.      ASSESSMENT AND PLAN: 1. CAD/CABG: She remains symptomatically stable. Normal LV systolic function by echocardiogram. Myocardial perfusion images on 10/19/14 demonstrated a mild to moderate amount of anteroapical and anterolateral wall ischemia with anterolateral wall hypokinesis. Left  ventricular systolic function was moderately reduced, calculated LVEF 37%.  Echocardiogram on 10/28/14 demonstrated normal left ventricular systolic function, EF 0000000, moderate concentric LVH, high ventricular filling pressures, ischemic wall motion abnormalities, mild to moderate mitral regurgitation, and mild tricuspid, aortic, and pulmonic regurgitation. Continue beta blocker and pravastatin. ASA previously stopped by a different provider.  2. Permanent atrial fibrillation: Rate is mildly elevated on metoprolol 25 mg twice daily. Maintained on warfarin, with INR managed by pharmacy.   3. Pacemaker for SSS: Normal device function on 10/16/2014. Patient is in atrial fibrillation 100% of the time with no high ventricular rates noted.  4. Acute on chronic diastolic heart failure: Decompensated after PCP stopped diuretics due to elevated BUN. Had been on torsemide 80 mg bid.  She is scheduled to see nephrology tomorrow. Given her marked lower extreme edema and problems with renal dysfunction, I have recommended hospitalization for inpatient diuresis and kidney function monitoring. She is in agreement with this plan. I will plan to directly admit her to [facility]. I have spoken with Dr. Roderic Palau.  5. Essential HTN: Reasonably controlled. Needs monitoring with need for IV diuresis.  6. Peripheral vascular disease: Follows with Dr. Bridgett Larsson.   Dispo:  I will plan to directly admit her to [facility]. I have spoken with Dr. Roderic Palau. Will obtain an echocardiogram and chest xray and have nephrology evaluate her.  Time spent: 40 minutes, of which greater than 50% was spent reviewing symptoms, relevant blood tests and studies, and discussing management plan with the patient.   Kate Sable, M.D., F.A.C.C.

## 2015-12-30 NOTE — Progress Notes (Signed)
ANTICOAGULATION CONSULT NOTE Pharmacy Consult for Warfarin Indication: atrial fibrillation  Allergies  Allergen Reactions  . Penicillins Other (See Comments)    Pt. Not sure of reaction.  Deirdre Peer. Zofran [Ondansetron Hcl]    Patient Measurements: Height: 5\' 4"  (162.6 cm) Weight: 229 lb (103.874 kg) IBW/kg (Calculated) : 54.7  Vital Signs: Temp: 97.6 F (36.4 C) (06/01 1532) Temp Source: Oral (06/01 1532) BP: 138/62 mmHg (06/01 1532) Pulse Rate: 94 (06/01 1532)  Labs:  Recent Labs  12/30/15 1735  HGB 9.4*  HCT 30.9*  PLT 307  LABPROT 25.8*  INR 2.39*  CREATININE 1.40*   Estimated Creatinine Clearance: 37 mL/min (by C-G formula based on Cr of 1.4).  Medical History: Past Medical History  Diagnosis Date  . Anemia 2008  . DM (diabetes mellitus) ([facility]) 1999  . Cataract     bilaterla   . Permanent atrial fibrillation ([facility])   . CAD (coronary artery disease)   . CRI (chronic renal insufficiency) 2008  . Hypertension   . Hyperlipemia   . GERD (gastroesophageal reflux disease)   . Arthritis   . Acute respiratory failure with hypoxia ([facility]) 06/12/2013    secondary to acute on chronic diastolic failure.  . Wrist fracture, left 06/13/2013    03-12-15 no residual issues  . Acute on chronic congestive heart failure with left ventricular diastolic dysfunction ([facility]) 06/11/2013  . Hx of CABG 10/24/2007    Dr. Dorris FetchHendrickson  . S/P CABG x 4   . Pacemaker 10/24/2007    Medtronic EnRhythm; implanted for SSS, symptomatic bradycardia, PAF  . Fracture November 2014    Left wrist-03-12-15 no issues now  . Obesity   . Transfusion history   . Venous insufficiency     8-12-16left lower lateral outside healed leg ulcer-scabbed over.   Medications:  Prescriptions prior to admission  Medication Sig Dispense Refill Last Dose  . aspirin EC 81 MG tablet Take 81 mg by mouth daily.   12/30/2015 at Unknown time  . cholecalciferol (VITAMIN D) 1000 UNITS tablet Take 3,000 Units by mouth daily.    12/30/2015 at Unknown time  . citalopram (CELEXA) 20 MG tablet Take 20 mg by mouth every morning.    12/30/2015 at Unknown time  . docusate sodium (COLACE) 100 MG capsule Take 100 mg by mouth 2 (two) times daily.   12/30/2015 at Unknown time  . fentaNYL (DURAGESIC - DOSED MCG/HR) 25 MCG/HR patch Place 25 mcg onto the skin every 3 (three) days.   12/26/2015  . folic acid (FOLVITE) 1 MG tablet Take 1 mg by mouth every morning.    12/30/2015 at Unknown time  . insulin aspart (NOVOLOG) 100 UNIT/ML FlexPen Inject 1-10 Units into the skin 3 (three) times daily with meals. Sliding scale 70-100=0 units 101-150=1 unit 151-200=2 units 201-250= 4 units 251-300=6 units 301-350=8 units 350< = 10 units   12/30/2015 at Unknown time  . insulin aspart (NOVOLOG) 100 UNIT/ML injection Inject 5 Units into the skin 2 (two) times daily.    12/30/2015 at Unknown time  . iron polysaccharides (NU-IRON) 150 MG capsule Take 150 mg by mouth daily.   12/30/2015 at Unknown time  . LEVEMIR 100 UNIT/ML injection Inject 10 Units into the skin at bedtime.    12/29/2015 at Unknown time  . levofloxacin (LEVAQUIN) 500 MG tablet Take 500 mg by mouth daily.   12/29/2015 at Unknown time  . levothyroxine (SYNTHROID, LEVOTHROID) 125 MCG tablet Take 125 mcg by mouth daily before breakfast.   12/30/2015 at  Unknown time  . Melatonin 3 MG CAPS Take 1 capsule by mouth at bedtime.   12/29/2015 at Unknown time  . metoprolol tartrate (LOPRESSOR) 25 MG tablet Take 25 mg by mouth 2 (two) times daily.    12/30/2015 at [place]  . Multiple Vitamin (MULTIVITAMIN WITH MINERALS) TABS tablet Take 1 tablet by mouth daily.   12/30/2015 at Unknown time  . polyethylene glycol (MIRALAX / GLYCOLAX) packet Take 17 g by mouth daily.    12/30/2015 at Unknown time  . potassium chloride SA (K-DUR,KLOR-CON) 20 MEQ tablet Take 20 mEq by mouth 2 (two) times daily.    12/30/2015 at Unknown time  . pravastatin (PRAVACHOL) 40 MG tablet Take 40 mg by mouth every evening.    12/29/2015 at Unknown time   . warfarin (COUMADIN) 5 MG tablet Take 7.5 mg by mouth every evening. 7.5mg  on Mondays and Fridays. Take 5mg  on all other days   12/29/2015 at Unknown time   Assessment: Okay for Protocol, INR within goal range.  Last dose on 5/31 per Nursing Home report.  Goal of Therapy:  INR 2-3   Plan:  Warfarin  Daily PT/INR  Pricilla Larsson 12/30/2015,6:59 PM

## 2015-12-30 NOTE — Progress Notes (Signed)
Patient's EKG Afib RVR 116. Dr. Myna Hidalgo notified.

## 2015-12-30 NOTE — H&P (Signed)
History and Physical    [name] [id number] DOB: [birth year] DOA: 12/30/2015  PCP: Neale Burly, MD   Patient coming from: Home, via cardiology clinic   Chief Complaint: Bilateral LE edema   HPI: [name] is a 82 y.o. female with medical history significant for chronic diastolic CHF, coronary artery disease status post CABG, chronic atrial fibrillation on Coumadin, insulin-dependent diabetes mellitus, hypertension, and osteoarthritis status post left TKA approximately 6 months ago complicated by cellulitis currently under treatment with linezolid who presents to the ED at the direction of her cardiologist for evaluation and management of acute on chronic diastolic CHF in the setting of worsening renal function. Patient had been managed with torsemide 80 mg twice daily plus metolazone twice weekly until these agents were stopped approximately 2 weeks ago by her PCP out of concern for BUN in the 130 range. Over the past 2 weeks, patient has developed marked increase in bilateral lower extremity edema resulting in bilateral leg pain. She denies any chest pain, palpitations, dyspnea, or orthopnea. She was evaluated by her cardiologist in the clinic today, and given her massive lower extremity edema and worsening renal function, direct admission for IV diuresis with close monitoring of renal function was arranged.  Patient is interviewed and examined on the telemetry unit at [facility] where she is afebrile, saturating well on room air, and with stable heart rate and blood pressure. She will be admitted to the telemetry unit for ongoing evaluation and management of acute on chronic diastolic CHF in the setting of worsening CKD.  Review of Systems:  All other systems reviewed and apart from HPI, are negative.  Past Medical History  Diagnosis Date  . Anemia 2008  . DM (diabetes mellitus) ([place]) 1999  . Cataract     bilaterla   . Permanent atrial fibrillation ([place])   . CAD  (coronary artery disease)   . CRI (chronic renal insufficiency) 2008  . Hypertension   . Hyperlipemia   . GERD (gastroesophageal reflux disease)   . Arthritis   . Acute respiratory failure with hypoxia ([place]) 06/12/2013    secondary to acute on chronic diastolic failure.  . Wrist fracture, left 06/13/2013    03-12-15 no residual issues  . Acute on chronic congestive heart failure with left ventricular diastolic dysfunction ([place]) 06/11/2013  . Hx of CABG 10/24/2007    Dr. Roxan Hockey  . S/P CABG x 4   . Pacemaker 10/24/2007    Medtronic EnRhythm; implanted for SSS, symptomatic bradycardia, PAF  . Fracture November 2014    Left wrist-03-12-15 no issues now  . Obesity   . Transfusion history   . Venous insufficiency     8-12-16left lower lateral outside healed leg ulcer-scabbed over.    Past Surgical History  Procedure Laterality Date  . Esophagogastroduodenoscopy  11/07/2007    [id number] Schatzki's ring.  Otherwise normal esophagus   . Appendectomy      APPENDICITIS  . Coronary artery bypass graft  10/24/2007    LIMA to LAD, SVG to DX1, SVG to OM, SVG to distal RCA (Dr. Roxan Hockey)  . Pacemaker insertion  10/24/2007    MDT EnRhythm implanted by Dr Rollene Fare  . Total knee arthroplasty Right     2012  . Coronary angioplasty  11/16/2003    PCI to prox mid RCA (2.75x37mm chromium cobalt vision Guidant stent) (Dr. Marella Chimes)  . Back surgery    . Tonsillectomy      age 24  . Insert /  replace / remove pacemaker    . Joint replacement Right   . Colonoscopy with esophagogastroduodenoscopy (egd) N/A 11/25/2012    SLF:Two SMALL AC polyps/Small internal hemorrhoids/The colon IS SLIGHTLY redundant-EGD:Schatzki ring at the gastroesophageal junction/Small hiatal hernia/MILD Non-erosive gastritis  . Transthoracic echocardiogram  08/2012    EF 55-65%, mod conc hypertrophy; mild AV regurg; calcified MV annulus with mod MR; LA mod diated; RV systolic pressure increased; RA mildly dilated; PA peak  pressure 75mmHg  . Nm myocar perf wall motion  2011    dipyridamole myoview - moderate ischemai in mid anteroseptal basal inferior inferioer, mid lateral and basal inferolateral region; abnormal study, EF 41%; high risk scan  . Cardiac catheterization  08/08/2007    3 vessel CAD (LAD, mid Cfx, mid RCA) (Dr. Marella Chimes)  . Tubal ligation    . Cataract extraction, bilateral    . Eye surgery Left Aug. 2015    torned retina repair  . Total knee arthroplasty Left 03/19/2015    Procedure: LEFT TOTAL KNEE ARTHROPLASTY;  Surgeon: Latanya Maudlin, MD;  Location: [facility] ORS;  Service: Orthopedics;  Laterality: Left;     reports that she quit smoking about 16 years ago. Her smoking use included Cigarettes. She has a 4 pack-year smoking history. She has never used smokeless tobacco. She reports that she does not drink alcohol or use illicit drugs.  Allergies  Allergen Reactions  . Penicillins Other (See Comments)    Pt. Not sure of reaction.  Tresa Moore Hcl]     Family History  Problem Relation Age of Onset  . Colon cancer Neg Hx   . Colon polyps Neg Hx   . Hodgkin's lymphoma Mother   . Diabetes Son   . Stroke Father      Prior to Admission medications   Medication Sig Start Date End Date Taking? Authorizing Provider  ALPRAZolam (XANAX) 0.25 MG tablet Take 0.25 mg by mouth 2 (two) times daily as needed for anxiety.     Historical Provider, MD  aspirin EC 81 MG tablet Take 81 mg by mouth daily.    Historical Provider, MD  cholecalciferol (VITAMIN D) 1000 UNITS tablet Take 3,000 Units by mouth daily.    Historical Provider, MD  citalopram (CELEXA) 20 MG tablet Take 20 mg by mouth every morning.     Historical Provider, MD  docusate sodium (COLACE) 100 MG capsule Take 100 mg by mouth 2 (two) times daily.    Historical Provider, MD  fentaNYL (DURAGESIC - DOSED MCG/HR) 25 MCG/HR patch Place 25 mcg onto the skin every 3 (three) days.    Historical Provider, MD  folic acid (FOLVITE) 1 MG  tablet Take 1 mg by mouth every morning.     Historical Provider, MD  insulin aspart (NOVOLOG) 100 UNIT/ML FlexPen Inject 1-10 Units into the skin 3 (three) times daily with meals. Sliding scale 70-100=0 units 101-150=1 unit 151-200=2 units 201-250= 4 units 251-300=6 units 301-350=8 units 350< = 10 units    Historical Provider, MD  insulin aspart (NOVOLOG) 100 UNIT/ML injection Inject 5 Units into the skin 3 (three) times daily before meals.    Historical Provider, MD  iron polysaccharides (NU-IRON) 150 MG capsule Take 150 mg by mouth daily.    Historical Provider, MD  LEVEMIR 100 UNIT/ML injection Inject 10 Units into the skin at bedtime.  12/07/14   Historical Provider, MD  levofloxacin (LEVAQUIN) 500 MG tablet Take 500 mg by mouth daily.    Historical Provider, MD  levothyroxine (  SYNTHROID, LEVOTHROID) 125 MCG tablet Take 125 mcg by mouth daily before breakfast.    Historical Provider, MD  linezolid (ZYVOX) 600 MG tablet Take 600 mg by mouth 2 (two) times daily.    Historical Provider, MD  Melatonin 3 MG CAPS Take 1 capsule by mouth at bedtime.    Historical Provider, MD  methocarbamol (ROBAXIN) 500 MG tablet Take 1 tablet (500 mg total) by mouth every 6 (six) hours as needed for muscle spasms. 03/21/15   Amber Cecilio Asper, PA-C  metolazone (ZAROXOLYN) 2.5 MG tablet Take one tab by mouth on Monday & Friday. 09/27/15   Herminio Commons, MD  metoprolol tartrate (LOPRESSOR) 25 MG tablet Take 25 mg by mouth 2 (two) times daily.  03/05/15   Historical Provider, MD  Nutritional Supplements (ARGINAID PO) Take 1 packet by mouth 2 (two) times daily. Mix with 6-8 ounces of water for 28 days for wound healing    Historical Provider, MD  polyethylene glycol (MIRALAX / GLYCOLAX) packet Take 17 g by mouth daily as needed for mild constipation.     Historical Provider, MD  potassium chloride SA (K-DUR,KLOR-CON) 20 MEQ tablet  02/26/15   Historical Provider, MD  pravastatin (PRAVACHOL) 40 MG tablet Take 40 mg by  mouth every evening.  08/14/12   Historical Provider, MD  traMADol (ULTRAM) 50 MG tablet Take 50 mg by mouth 3 (three) times daily as needed for moderate pain.  06/16/13   Rexene Alberts, MD  vitamin C (ASCORBIC ACID) 500 MG tablet Take 500 mg by mouth every morning.    Historical Provider, MD  warfarin (COUMADIN) 5 MG tablet Take 7.5 mg by mouth every evening.     Historical Provider, MD    Physical Exam: Filed Vitals:   12/30/15 1532  BP: 138/62  Pulse: 94  Temp: 97.6 F (36.4 C)  TempSrc: Oral  Resp: 18  Height: 5\' 4"  (1.626 m)  Weight: 103.874 kg (229 lb)  SpO2: 96%      Constitutional: NAD, calm, comfortable Eyes: PERTLA, lids and conjunctivae normal ENMT: Mucous membranes are moist. Posterior pharynx clear of any exudate or lesions.   Neck: normal, supple, no masses, no thyromegaly Respiratory: clear to auscultation bilaterally, no wheezing, no crackles. Normal respiratory effort.  Cardiovascular: Rate ~100 and irregular. Massive LE edema b/l. No carotid bruits.   Abdomen: No distension, no tenderness, no masses palpated. Bowel sounds normal.  Musculoskeletal: no clubbing / cyanosis. No joint deformity upper and lower extremities.    Skin:  Warm, dry, well-perfused. Erythema, heat, and tenderness overlying left knee anteriorly with serosanguinous drainage. Neurologic: CN 2-12 grossly intact. Sensation intact, DTR normal. Strength 5/5 in all 4 limbs.  Psychiatric: Normal judgment and insight. Alert and oriented x 3. Normal mood and affect.     Labs on Admission: I have personally reviewed following labs and imaging studies  CBC: No results for input(s): WBC, NEUTROABS, HGB, HCT, MCV, PLT in the last 168 hours. Basic Metabolic Panel: No results for input(s): NA, K, CL, CO2, GLUCOSE, BUN, CREATININE, CALCIUM, MG, PHOS in the last 168 hours. GFR: CrCl cannot be calculated (Patient has no serum creatinine result on file.). Liver Function Tests: No results for input(s):  AST, ALT, ALKPHOS, BILITOT, PROT, ALBUMIN in the last 168 hours. No results for input(s): LIPASE, AMYLASE in the last 168 hours. No results for input(s): AMMONIA in the last 168 hours. Coagulation Profile: No results for input(s): INR, PROTIME in the last 168 hours. Cardiac Enzymes: No results for  input(s): CKTOTAL, CKMB, CKMBINDEX, TROPONINI in the last 168 hours. BNP (last 3 results) No results for input(s): PROBNP in the last 8760 hours. HbA1C: No results for input(s): HGBA1C in the last 72 hours. CBG: No results for input(s): GLUCAP in the last 168 hours. Lipid Profile: No results for input(s): CHOL, HDL, LDLCALC, TRIG, CHOLHDL, LDLDIRECT in the last 72 hours. Thyroid Function Tests: No results for input(s): TSH, T4TOTAL, FREET4, T3FREE, THYROIDAB in the last 72 hours. Anemia Panel: No results for input(s): VITAMINB12, FOLATE, FERRITIN, TIBC, IRON, RETICCTPCT in the last 72 hours. Urine analysis:    Component Value Date/Time   COLORURINE YELLOW 03/12/2015 1402   APPEARANCEUR CLOUDY* 03/12/2015 1402   LABSPEC 1.014 03/12/2015 1402   PHURINE 6.0 03/12/2015 1402   GLUCOSEU NEGATIVE 03/12/2015 1402   HGBUR TRACE* 03/12/2015 1402   BILIRUBINUR NEGATIVE 03/12/2015 1402   KETONESUR NEGATIVE 03/12/2015 1402   PROTEINUR NEGATIVE 03/12/2015 1402   UROBILINOGEN 0.2 03/12/2015 1402   NITRITE NEGATIVE 03/12/2015 1402   LEUKOCYTESUR SMALL* 03/12/2015 1402   Sepsis Labs: @LABRCNTIP (procalcitonin:4,lacticidven:4) )No results found for this or any previous visit (from the past 240 hour(s)).   Radiological Exams on Admission: No results found.  EKG:  Independently reviewed: Atrial fibrillation with RVR (rate 116), anterolateral TWI's  Assessment/Plan  1. Acute on chronic diastolic CHF  - Presents with marked peripheral edema after being off her diuretics for ~2 wks d/t worsening renal function  - No respiratory complaints on admission  - Had been on torsemide 80 mg BID with  metolazone twice-weekly  - Diurese, starting with Lasix 40 mg IV BID, adjusted prn  - Follow renal function closely  - Strict I/Os, daily wts, fluid-restrict diet, SLIV  - Update echocardiogram ordered    2. CKD stage III  - SCr 1.40, BUN 29 at time of admission; better than recent priors  - Will need close monitoring during diuresis  - BMP qAM  - Avoid nephrotoxins where feasible    3. Atrial fibrillation, chronic  - In RVR at time of admission, normalized with 5 mg IVP metoprolol  - CHADS-VASc 6 (age x2, gender, HTN, DM, CAD) - Managed with coumadin and Lopressor at home  - Check INR and continue coumadin with pharmacy to dose  - Continue current-dose metoprolol 25 mg BID  - Monitor on telemetry    4. Cellulitis with abscess of LLE - Pt reports MRSA was cultured; has been on Zyvox at SNF  - Continue treatment with vancomycin while in hospital   5. CAD s/p CABG  - No anginal complaints on admission  - Managed with ASA 81, Pravachol, Lopressor - Continue current management   6. Hypertension - At goal currently  - Continue current management with diuretics and metoprolol    7. Type II DM   - A1c 9.7% in November 2014, reflecting poor glycemic-control at that time  - Managed with Levemir 10 units qHS and Novolin per sliding-scale at her SNF  - Continue Levemir at reduced-dose to start, plus sliding-scale correctional  - Check CBG with meals and qHS, adjust insulin regimen prn  - Update A1c, pending    8. Hypothyroidism - Appears stable  - Continue current-dose Synthroid    DVT prophylaxis: sq heparin  Code Status: Full  Family Communication: Updated son at bedside  Disposition Plan: Admit to telemetry  Consults called: None Admission status: Inpatient    Vianne Bulls, MD [facility] Pager [phone]  If 7PM-7AM, please contact night-coverage [email or web address] Password [email or web address]  12/30/2015, 5:27 PM

## 2015-12-30 NOTE — Patient Instructions (Signed)
   Admit to [facility] for acute on chronic diastolic heart failure & renal failure.

## 2015-12-31 ENCOUNTER — Inpatient Hospital Stay (HOSPITAL_BASED_OUTPATIENT_CLINIC_OR_DEPARTMENT_OTHER): Payer: Medicare HMO

## 2015-12-31 ENCOUNTER — Inpatient Hospital Stay (HOSPITAL_COMMUNITY): Payer: Medicare HMO

## 2015-12-31 DIAGNOSIS — E1122 Type 2 diabetes mellitus with diabetic chronic kidney disease: Secondary | ICD-10-CM | POA: Diagnosis not present

## 2015-12-31 DIAGNOSIS — I509 Heart failure, unspecified: Secondary | ICD-10-CM

## 2015-12-31 DIAGNOSIS — I5033 Acute on chronic diastolic (congestive) heart failure: Secondary | ICD-10-CM | POA: Diagnosis not present

## 2015-12-31 LAB — BASIC METABOLIC PANEL
ANION GAP: 6 (ref 5–15)
BUN: 30 mg/dL — ABNORMAL HIGH (ref 6–20)
CALCIUM: 8.8 mg/dL — AB (ref 8.9–10.3)
CO2: 25 mmol/L (ref 22–32)
CREATININE: 1.35 mg/dL — AB (ref 0.44–1.00)
Chloride: 107 mmol/L (ref 101–111)
GFR calc Af Amer: 41 mL/min — ABNORMAL LOW (ref >60)
GFR, EST NON AFRICAN AMERICAN: 36 mL/min — AB (ref >60)
GLUCOSE: 81 mg/dL (ref 65–99)
Potassium: 4.3 mmol/L (ref 3.5–5.1)
Sodium: 138 mmol/L (ref 135–145)

## 2015-12-31 LAB — GLUCOSE, CAPILLARY
GLUCOSE-CAPILLARY: 80 mg/dL (ref 65–99)
GLUCOSE-CAPILLARY: 106 mg/dL — AB (ref 65–99)
GLUCOSE-CAPILLARY: 187 mg/dL — AB (ref 65–99)
Glucose-Capillary: 229 mg/dL — ABNORMAL HIGH (ref 65–99)

## 2015-12-31 LAB — HEMOGLOBIN A1C
Hgb A1c MFr Bld: 8.1 % — ABNORMAL HIGH (ref 4.8–5.6)
Mean Plasma Glucose: 186 mg/dL

## 2015-12-31 LAB — ECHOCARDIOGRAM COMPLETE
Height: 64 in
Weight: 3710.4 oz

## 2015-12-31 LAB — PROTIME-INR
INR: 2.28 — AB (ref 0.00–1.49)
PROTHROMBIN TIME: 24.9 s — AB (ref 11.6–15.2)

## 2015-12-31 MED ORDER — ENSURE ENLIVE PO LIQD
237.0000 mL | Freq: Two times a day (BID) | ORAL | Status: DC
  Administered 2015-12-31 – 2016-01-01 (×4): 237 mL via ORAL

## 2015-12-31 MED ORDER — WARFARIN SODIUM 5 MG PO TABS
7.5000 mg | ORAL_TABLET | Freq: Once | ORAL | Status: AC
  Administered 2015-12-31: 7.5 mg via ORAL
  Filled 2015-12-31: qty 2

## 2015-12-31 NOTE — Progress Notes (Signed)
Echocardiogram 2D Echocardiogram has been performed.  [name] 12/31/2015, 2:49 PM

## 2015-12-31 NOTE — Progress Notes (Signed)
[place] for Warfarin Indication: atrial fibrillation  Allergies  Allergen Reactions  . Penicillins Other (See Comments)    Pt. Not sure of reaction.  Tresa Moore Hcl]    Patient Measurements: Height: 5\' 4"  (162.6 cm) Weight: 231 lb 14.4 oz (105.189 kg) IBW/kg (Calculated) : 54.7  Vital Signs: Temp: 98.2 F (36.8 C) (06/02 0649) Temp Source: Oral (06/02 0649) BP: 148/48 mmHg (06/02 0937) Pulse Rate: 92 (06/02 0937)  Labs:  Recent Labs  12/30/15 1735 12/31/15 0606  HGB 9.4*  --   HCT 30.9*  --   PLT 307  --   LABPROT 25.8* 24.9*  INR 2.39* 2.28*  CREATININE 1.40* 1.35*   Estimated Creatinine Clearance: 38.6 mL/min (by C-G formula based on Cr of 1.35).  Medical History: Past Medical History  Diagnosis Date  . Anemia 2008  . DM (diabetes mellitus) ([place]) 1999  . Cataract     bilaterla   . Permanent atrial fibrillation ([place])   . CAD (coronary artery disease)   . CRI (chronic renal insufficiency) 2008  . Hypertension   . Hyperlipemia   . GERD (gastroesophageal reflux disease)   . Arthritis   . Acute respiratory failure with hypoxia ([place]) 06/12/2013    secondary to acute on chronic diastolic failure.  . Wrist fracture, left 06/13/2013    03-12-15 no residual issues  . Acute on chronic congestive heart failure with left ventricular diastolic dysfunction ([place]) 06/11/2013  . Hx of CABG 10/24/2007    Dr. Roxan Hockey  . S/P CABG x 4   . Pacemaker 10/24/2007    Medtronic EnRhythm; implanted for SSS, symptomatic bradycardia, PAF  . Fracture November 2014    Left wrist-03-12-15 no issues now  . Obesity   . Transfusion history   . Venous insufficiency     8-12-16left lower lateral outside healed leg ulcer-scabbed over.   Medications:  Prescriptions prior to admission  Medication Sig Dispense Refill Last Dose  . aspirin EC 81 MG tablet Take 81 mg by mouth daily.   12/30/2015 at Unknown time  . cholecalciferol (VITAMIN  D) 1000 UNITS tablet Take 3,000 Units by mouth daily.   12/30/2015 at Unknown time  . citalopram (CELEXA) 20 MG tablet Take 20 mg by mouth every morning.    12/30/2015 at Unknown time  . docusate sodium (COLACE) 100 MG capsule Take 100 mg by mouth 2 (two) times daily.   12/30/2015 at Unknown time  . fentaNYL (DURAGESIC - DOSED MCG/HR) 25 MCG/HR patch Place 25 mcg onto the skin every 3 (three) days.   [id number]  . folic acid (FOLVITE) 1 MG tablet Take 1 mg by mouth every morning.    12/30/2015 at Unknown time  . insulin aspart (NOVOLOG) 100 UNIT/ML FlexPen Inject 1-10 Units into the skin 3 (three) times daily with meals. Sliding scale 70-100=0 units 101-150=1 unit 151-200=2 units 201-250= 4 units 251-300=6 units 301-350=8 units 350< = 10 units   12/30/2015 at Unknown time  . insulin aspart (NOVOLOG) 100 UNIT/ML injection Inject 5 Units into the skin 2 (two) times daily.    12/30/2015 at Unknown time  . iron polysaccharides (NU-IRON) 150 MG capsule Take 150 mg by mouth daily.   12/30/2015 at Unknown time  . LEVEMIR 100 UNIT/ML injection Inject 10 Units into the skin at bedtime.    12/29/2015 at Unknown time  . levofloxacin (LEVAQUIN) 500 MG tablet Take 500 mg by mouth daily.   12/29/2015 at Unknown time  . levothyroxine (  SYNTHROID, LEVOTHROID) 125 MCG tablet Take 125 mcg by mouth daily before breakfast.   12/30/2015 at Unknown time  . linezolid (ZYVOX) 600 MG tablet Take 600 mg by mouth 2 (two) times daily.   12/30/2015 at Unknown time  . Melatonin 3 MG CAPS Take 1 capsule by mouth at bedtime.   12/29/2015 at Unknown time  . metoprolol tartrate (LOPRESSOR) 25 MG tablet Take 25 mg by mouth 2 (two) times daily.    12/30/2015 at [place]  . Multiple Vitamin (MULTIVITAMIN WITH MINERALS) TABS tablet Take 1 tablet by mouth daily.   12/30/2015 at Unknown time  . polyethylene glycol (MIRALAX / GLYCOLAX) packet Take 17 g by mouth daily.    12/30/2015 at Unknown time  . potassium chloride SA (K-DUR,KLOR-CON) 20 MEQ tablet Take 20 mEq  by mouth 2 (two) times daily.    12/30/2015 at Unknown time  . pravastatin (PRAVACHOL) 40 MG tablet Take 40 mg by mouth every evening.    12/29/2015 at Unknown time  . warfarin (COUMADIN) 5 MG tablet Take 7.5 mg by mouth every evening. 7.5mg  on Mondays and Fridays. Take 5mg  on all other days   12/29/2015 at Unknown time   Assessment: Okay for Protocol, INR within goal range.  Last dose on 5/31 per Nursing Home report.  Goal of Therapy:  INR 2-3   Plan:  Warfarin 7.5mg  today (PTA dose per med rec) Daily PT/INR  [place] Crane, Cordel Drewes A 12/31/2015,11:53 AM

## 2015-12-31 NOTE — Progress Notes (Signed)
PROGRESS NOTE    [name]  [id number] DOB: 06-30-1933 DOA: 12/30/2015 PCP: Neale Burly, MD     Brief Narrative:  82 year old lady admitted on 6 last one from nursing home due to bilateral lower extremity edema. She was thought to have acute on chronic diastolic CHF and is admitted for management.   Assessment & Plan:   Principal Problem:   Acute on chronic diastolic CHF (congestive heart failure) (HCC) Active Problems:   Permanent atrial fibrillation (HCC)   Cellulitis and abscess of leg   CKD (chronic kidney disease), stage III   Insulin dependent diabetes mellitus ([place])   Essential hypertension   Hypothyroidism   Acute on chronic heart failure (HCC)   Acute on chronic diastolic CHF -Prior echo in February 2014 shows an ejection fraction of 55-65% and moderate concentric hypertrophy. Repeat echo is pending. -Wonder about accuracy of intake and output that is documented she appears to be positive. -Continue Lasix 40 mg IV twice a day.  Stage III chronic kidney disease  -creatinine appears to be at baseline.  Chronic atrial fibrillation -Anticoagulated on Coumadin, rate controlled on Lopressor.  Cellulitis with abscess of left lower extremity. -It appears she has been on Zyvox at SNF, agree with continuing vancomycin and can revert back to Zyvox at time of discharge.  Coronary artery disease -No chest pain, stable, continue aspirin, Lopressor, statin next  Type 2 diabetes -Well-controlled  Hypothyroidism -Continue Synthroid  Hypertension -Fair control   DVT prophylaxis: On Coumadin Code Status: Full code Family Communication: Patient only Disposition Plan: Hope for discharge back to SNF in 24-48 hours  Consultants:   None  Procedures:   None  Antimicrobials:   None    Subjective: Feels well other than some right lower extremity pain  Objective: Filed Vitals:   12/30/15 1532 12/30/15 2108 12/31/15 0649 12/31/15 0937  BP: 138/62  115/86 121/67 148/48  Pulse: 94 71 85 92  Temp: 97.6 F (36.4 C) 98.1 F (36.7 C) 98.2 F (36.8 C)   TempSrc: Oral Oral Oral   Resp: 18 20 20    Height: 5\' 4"  (1.626 m)     Weight: 103.874 kg (229 lb)  105.189 kg (231 lb 14.4 oz)   SpO2: 96% 97% 95%     Intake/Output Summary (Last 24 hours) at 12/31/15 1517 Last data filed at 12/31/15 0940  Gross per 24 hour  Intake    243 ml  Output      0 ml  Net    243 ml   Filed Weights   12/30/15 1532 12/31/15 0649  Weight: 103.874 kg (229 lb) 105.189 kg (231 lb 14.4 oz)    Examination:  General exam: Alert, awake, oriented x 3 Respiratory system: Clear to auscultation. Respiratory effort normal. Cardiovascular system:RRR. No murmurs, rubs, gallops. Gastrointestinal system: Abdomen is nondistended, soft and nontender. No organomegaly or masses felt. Normal bowel sounds heard. Central nervous system: Alert and oriented. No focal neurological deficits. Extremities: 1-2+ pitting edema bilaterally, left upper shin covered in bandage with what appears to be significant drainage Skin: No rashes, lesions or ulcers Psychiatry: Judgement and insight appear normal. Mood & affect appropriate.     Data Reviewed: I have personally reviewed following labs and imaging studies  CBC:  Recent Labs Lab 12/30/15 1735  WBC 7.3  NEUTROABS 5.5  HGB 9.4*  HCT 30.9*  MCV 82.8  PLT [id number]   Basic Metabolic Panel:  Recent Labs Lab 12/30/15 1735 12/31/15 0606  NA 135 138  K 4.6 4.3  CL 105 107  CO2 24 25  GLUCOSE 99 81  BUN 29* 30*  CREATININE 1.40* 1.35*  CALCIUM 8.8* 8.8*  MG 2.0  --    GFR: Estimated Creatinine Clearance: 38.6 mL/min (by C-G formula based on Cr of 1.35). Liver Function Tests:  Recent Labs Lab 12/30/15 1735  AST 19  ALT 16  ALKPHOS 61  BILITOT 0.4  PROT 7.3  ALBUMIN 2.6*   No results for input(s): LIPASE, AMYLASE in the last 168 hours. No results for input(s): AMMONIA in the last 168 hours. Coagulation  Profile:  Recent Labs Lab 12/30/15 1735 12/31/15 0606  INR 2.39* 2.28*   Cardiac Enzymes: No results for input(s): CKTOTAL, CKMB, CKMBINDEX, TROPONINI in the last 168 hours. BNP (last 3 results) No results for input(s): PROBNP in the last 8760 hours. HbA1C:  Recent Labs  12/30/15 1735  HGBA1C 8.1*   CBG:  Recent Labs Lab 12/30/15 1840 12/30/15 2158 12/31/15 0754 12/31/15 1202  GLUCAP 156* 175* 80 106*   Lipid Profile: No results for input(s): CHOL, HDL, LDLCALC, TRIG, CHOLHDL, LDLDIRECT in the last 72 hours. Thyroid Function Tests: No results for input(s): TSH, T4TOTAL, FREET4, T3FREE, THYROIDAB in the last 72 hours. Anemia Panel: No results for input(s): VITAMINB12, FOLATE, FERRITIN, TIBC, IRON, RETICCTPCT in the last 72 hours. Urine analysis:    Component Value Date/Time   COLORURINE YELLOW 03/12/2015 1402   APPEARANCEUR CLOUDY* 03/12/2015 1402   LABSPEC 1.014 03/12/2015 1402   PHURINE 6.0 03/12/2015 1402   GLUCOSEU NEGATIVE 03/12/2015 1402   HGBUR TRACE* 03/12/2015 1402   BILIRUBINUR NEGATIVE 03/12/2015 1402   KETONESUR NEGATIVE 03/12/2015 1402   PROTEINUR NEGATIVE 03/12/2015 1402   UROBILINOGEN 0.2 03/12/2015 1402   NITRITE NEGATIVE 03/12/2015 1402   LEUKOCYTESUR SMALL* 03/12/2015 1402   Sepsis Labs: @LABRCNTIP (procalcitonin:4,lacticidven:4)  ) Recent Results (from the past 240 hour(s))  MRSA PCR Screening     Status: None   Collection Time: 12/30/15  4:07 PM  Result Value Ref Range Status   MRSA by PCR NEGATIVE NEGATIVE Final    Comment:        The GeneXpert MRSA Assay (FDA approved for NASAL specimens only), is one component of a comprehensive MRSA colonization surveillance program. It is not intended to diagnose MRSA infection nor to guide or monitor treatment for MRSA infections.          Radiology Studies: Dg Chest Port 1 View  12/31/2015  CLINICAL DATA: Acute CHF. EXAM: PORTABLE CHEST 1 VIEW COMPARISON:  03/12/2015. FINDINGS:  Cardiac pacer noted with lead tips in right atrium right ventricle. Prior CABG. Diffuse bilateral from interstitial prominence. Small left pleural effusion cannot be excluded. These findings suggest congestive heart failure. Underlying chronic interstitial lung disease also most likely present. No pneumothorax. IMPRESSION: 1. Cardiac pacer stable position. Prior CABG. Cardiomegaly with diffuse bilateral from interstitial prominence and small left pleural effusions suggesting congestive heart failure. 2.  Underlying chronic interstitial lung disease. Electronically Signed   By: Marcello Moores  Register   On: 12/31/2015 07:05        Scheduled Meds: . aspirin EC  81 mg Oral Daily  . cholecalciferol  3,000 Units Oral Daily  . citalopram  20 mg Oral q morning - 10a  . docusate sodium  100 mg Oral BID  . feeding supplement (ENSURE ENLIVE)  237 mL Oral BID BM  . fentaNYL  25 mcg Transdermal Q72H  . folic acid  1 mg Oral q morning - 10a  .  furosemide  40 mg Intravenous BID  . insulin aspart  0-15 Units Subcutaneous TID WC  . insulin detemir  5 Units Subcutaneous QHS  . iron polysaccharides  150 mg Oral Daily  . levothyroxine  125 mcg Oral QAC breakfast  . metoprolol tartrate  25 mg Oral BID  . pravastatin  40 mg Oral QPM  . sodium chloride flush  3 mL Intravenous Q12H  . vancomycin  1,250 mg Intravenous Q24H  . vitamin C  500 mg Oral q morning - 10a  . warfarin  7.5 mg Oral Once  . Warfarin - Pharmacist Dosing Inpatient   Does not apply Q24H   Continuous Infusions:    LOS: 1 day    Time spent: 25 minutes. Greater than 50% of this time was spent in direct contact with the patient coordinating care.     Lelon Frohlich, MD [facility] Pager [phone]  If 7PM-7AM, please contact night-coverage [email or web address] Password [facility] 12/31/2015, 3:17 PM

## 2015-12-31 NOTE — Progress Notes (Signed)
Initial Nutrition Assessment  DOCUMENTATION CODES:   Obesity unspecified  INTERVENTION:  Ensure Enlive po BID, each supplement provides 350 kcal and 20 grams of protein    NUTRITION DIAGNOSIS:   Inadequate oral intake related to mouth pain, poor appetite as evidenced by meal completion < 50%.  GOAL:   Patient will meet greater than or equal to 90% of their needs   MONITOR:   PO intake, Supplement acceptance, Weight trends, Labs, Skin  REASON FOR ASSESSMENT:   Malnutrition Screening Tool    ASSESSMENT:  82 y.o. female with medical history significant for chronic diastolic CHF, coronary artery disease status post CABG, chronic atrial fibrillation on Coumadin, insulin-dependent diabetes mellitus, hypertension, and osteoarthritis status post left TKA approximately 6 months ago complicated by cellulitis currently under treatment with linezolid who presents to the ED at the direction of her cardiologist for evaluation and management of acute on chronic diastolic CHF in the setting of worsening renal function  Pt says her mouth is hurting and has been since "I started on that medicine for my knee". Suspect this is a contributing factor in her poor meal intake. Encouraged soft foods and emphasized the importance of adequate nutrients. She says her appetite just has not been good. She is able to feed herself but would benefit from tray set-up. Pt reports 50# wt loss but hospital records don't confirm this. In February at her Cardiology follow up her weight was 225# and MD noted improved edema at the time. Currently she has 2+ edema bi-lateral lower extremity.  Ms Velador lives at [facility] and has for the past 3 years. According to her she is able to walk when her legs  "aren't swollen".  NFPE- Bilateral lower extremity edema, no depletions of fat mass.  Recent Labs Lab 12/30/15 1735 12/31/15 0606  NA 135 138  K 4.6 4.3  CL 105 107  CO2 24 25  BUN 29* 30*  CREATININE 1.40*  1.35*  CALCIUM 8.8* 8.8*  MG 2.0  --   GLUCOSE 99 81    CBG's- 156, 175, 80 respectively   Meds: lasix BID, folic acid, vitamin D, insulin   Diet Order:  Diet heart healthy/carb modified Room service appropriate?: Yes; Fluid consistency:: Thin; Fluid restriction:: 1500 mL Fluid  Skin:   left knee has an open area that is being treated . Ms Magnin says it's been there since her knee surgery last year.  Last BM:   5/31   Height:   Ht Readings from Last 1 Encounters:  12/30/15 5\' 4"  (1.626 m)    Weight:   Wt Readings from Last 1 Encounters:  12/31/15 231 lb 14.4 oz (105.189 kg)    Ideal Body Weight:  52.2 kg  BMI:  Body mass index is 39.79 kg/(m^2).  Estimated Nutritional Needs:   Kcal:  2193 kcal  Protein:  63-75 gr  Fluid:  1.5 liters daily per MD  EDUCATION NEEDS: none at this time   Colman Cater MS,RD,CSG,LDN Office: [phone] Pager: [phone]

## 2015-12-31 NOTE — Care Management Important Message (Signed)
Important Message  Patient Details  Name: ARYKA TREECE MRN: [id number] Date of Birth: [birth year]   Medicare Important Message Given:  Yes    Tarron Krolak, Chauncey Reading, RN 12/31/2015, 12:20 PM

## 2015-12-31 NOTE — Progress Notes (Signed)
Pt consistently tachycardic. Informed by central telemetry that patient is consistently tachycardic. Pt asymptomatic and reports no acute distress or discomfort. MD E-Paged.

## 2015-12-31 NOTE — Care Management Obs Status (Signed)
[place] NOTIFICATION   Patient Details  Name: [name] MRN: [id number] Date of Birth: [birth year]   Medicare Observation Status Notification Given:  Yes    Daniela Hernan, Chauncey Reading, RN 12/31/2015, 2:27 PM

## 2015-12-31 NOTE — Clinical Social Work Note (Addendum)
Clinical Social Work Assessment  Patient Details  Name: [name] MRN: [id number] Date of Birth: [birth year]  Date of referral:  12/31/15               Reason for consult:  Discharge Planning                Permission sought to share information with:  Facility Art therapist granted to share information::  Yes, Verbal Permission Granted  Name::        Agency::  [facility]  Relationship::  facility  Contact Information:     Housing/Transportation Living arrangements for the past 2 months:  [place] of Information:  Patient, Facility Patient Interpreter Needed:  None Criminal Activity/Legal Involvement Pertinent to Current Situation/Hospitalization:  No - Comment as needed Significant Relationships:  Adult Children Lives with:  Facility Resident Do you feel safe going back to the place where you live?  Yes Need for family participation in patient care:  Yes (Comment)  Care giving concerns:  None reported. Pt is [facility] resident at [facility].   Social Worker assessment / plan:  CSW met with pt at bedside. Pt alert and oriented and reports she has been a resident at [facility] for about 3 years. She has 3 sons who are involved and visit regularly. Pt's daughter is in a SNF in [place]. Support provided as pt shared this has been difficult since she was primary caregiver for daughter until she went to [place]. Pt indicates she loves it at [facility] and plans to return at d/c. Per Mardene Celeste at facility, pt is nursing level of care and okay to return. Pt has long term Medicaid and will return nursing level of care so no need for [facility] authorization. At baseline, pt uses wheelchair.   Employment status:  Retired Nurse, adult, Medicaid In [place] PT Recommendations:  Not assessed at this time [place] / Referral to community resources:  Other (Comment Required) (return to [facility])  Patient/Family's Response to care:  Pt requests to return to [facility] when medically stable.   Patient/Family's Understanding of and Emotional Response to Diagnosis, Current Treatment, and Prognosis:  Pt is aware of admission diagnosis and shared she was at cardiology appointment yesterday and they direct admitted her.   Emotional Assessment Appearance:  Appears stated age Attitude/Demeanor/Rapport:  Other (Cooperative) Affect (typically observed):  Appropriate Orientation:  Oriented to Self, Oriented to Place, Oriented to  Time, Oriented to Situation Alcohol / Substance use:  Not Applicable Psych involvement (Current and /or in the community):  No (Comment)  Discharge Needs  Concerns to be addressed:  Discharge Planning Concerns Readmission within the last 30 days:  No Current discharge risk:  None Barriers to Discharge:  Continued Medical Work up   [employer], [employer], [place] 12/31/2015, 9:03 AM [phone]

## 2015-12-31 NOTE — NC FL2 (Signed)
[place] LEVEL OF CARE SCREENING TOOL     IDENTIFICATION  Patient Name: [name] Birthdate: Jan 31, 1933 Sex: female Admission Date (Current Location): 12/30/2015  [place] and [place] Number:  [place] [id number] [place] and Address:  [place] [address] [place]      Provider Number: [id number]  Attending Physician Name and Address:  Koleen Nimrod Acost*  Relative Name and Phone Number:       Current Level of Care: Hospital Recommended Level of Care: [place] Prior Approval Number:    Date Approved/Denied:   PASRR Number: [id number]  Discharge Plan: SNF    Current Diagnoses: Patient Active Problem List   Diagnosis Date Noted  . Acute on chronic diastolic CHF (congestive heart failure) ([place]) 12/30/2015  . CKD (chronic kidney disease), stage III 12/30/2015  . Insulin dependent diabetes mellitus ([place]) 12/30/2015  . Essential hypertension 12/30/2015  . Hypothyroidism 12/30/2015  . History of total knee arthroplasty 03/19/2015  . Aftercare following surgery of the circulatory system, [place] 04/24/2014  . Symptomatic bradycardia 04/17/2014  . Peripheral vascular disease, unspecified ([place]) 01/23/2014  . Venous stasis ulcer ([place]) 10/17/2013  . Atherosclerosis of native arteries of the extremities with ulceration(440.23) 08/29/2013  . Colles' fracture of left radius 08/19/2013  . Wrist fracture, left 06/13/2013  . Fall 06/13/2013  . Acute respiratory failure with hypoxia ([place]) 06/12/2013  . Visual hallucinations 06/12/2013  . DM (diabetes mellitus), type 2 ([place]) 06/11/2013  . Acute on chronic diastolic heart failure ([place]) 06/11/2013  . Morbid obesity ([place]) 06/11/2013  . ARF (acute renal failure) ([place]) 06/07/2013  . Cellulitis and abscess of leg 06/05/2013  . CHF exacerbation ([place]) 11/19/2012  . Chronic lower GI bleeding 11/19/2012  . Type II or unspecified type diabetes mellitus without mention  of complication, not stated as uncontrolled 11/19/2012  . Chronic kidney disease (CKD), stage III (moderate) 11/19/2012  . Permanent atrial fibrillation ([place]) 11/15/2012  . Long term (current) use of anticoagulants 11/15/2012  . Anemia     Orientation RESPIRATION BLADDER Height & Weight     Self, Time, Situation, Place  Normal Incontinent Weight: 231 lb 14.4 oz (105.189 kg) Height:  5\' 4"  (162.6 cm)  BEHAVIORAL SYMPTOMS/MOOD NEUROLOGICAL BOWEL NUTRITION STATUS  Other (Comment) (n/a)  (n/a) Incontinent Diet (heart healthy/carb modified 1500 mL fluid restriction)  AMBULATORY STATUS COMMUNICATION OF NEEDS Skin   Extensive Assist Verbally Other (Comment) (Blister and weeping to bilateral legs)                       Personal Care Assistance Level of Assistance  Bathing, Feeding, Dressing Bathing Assistance: Maximum assistance Feeding assistance: Limited assistance Dressing Assistance: Maximum assistance     Functional Limitations Info  Sight, Hearing, Speech Sight Info: Adequate Hearing Info: Adequate Speech Info: Adequate    SPECIAL CARE FACTORS FREQUENCY                       Contractures Contractures Info: Not present    Additional Factors Info  Psychotropic, Insulin Sliding Scale Code Status Info: Full code Allergies Info: Penicillins, Zofran Psychotropic Info: Xanax   Isolation Precautions Info: Contact precautions     Current Medications (12/31/2015):  This is the current hospital active medication list Current Facility-Administered Medications  Medication Dose Route Frequency Provider Last Rate Last Dose  . 0.9 %  sodium chloride infusion  250 mL Intravenous PRN Vianne Bulls, MD      .  ALPRAZolam Duanne Moron) tablet 0.25 mg  0.25 mg Oral BID PRN Vianne Bulls, MD   0.25 mg at 12/30/15 2218  . aspirin EC tablet 81 mg  81 mg Oral Daily Vianne Bulls, MD   81 mg at 12/31/15 [id number]  . cholecalciferol (VITAMIN D) tablet 3,000 Units  3,000 Units Oral Daily  Vianne Bulls, MD   3,000 Units at 12/31/15 [phone]  . citalopram (CELEXA) tablet 20 mg  20 mg Oral q morning - 10a Vianne Bulls, MD   20 mg at 12/31/15 0937  . docusate sodium (COLACE) capsule 100 mg  100 mg Oral BID Vianne Bulls, MD   100 mg at 12/31/15 [id number]  . feeding supplement (ENSURE ENLIVE) (ENSURE ENLIVE) liquid 237 mL  237 mL Oral BID BM Estela Leonie Green, MD      . fentaNYL (Irion - dosed mcg/hr) patch 25 mcg  25 mcg Transdermal Q72H Vianne Bulls, MD   25 mcg at 12/30/15 1823  . folic acid (FOLVITE) tablet 1 mg  1 mg Oral q morning - 10a Vianne Bulls, MD   1 mg at 12/31/15 0937  . furosemide (LASIX) injection 40 mg  40 mg Intravenous BID Vianne Bulls, MD   40 mg at 12/31/15 0824  . insulin aspart (novoLOG) injection 0-15 Units  0-15 Units Subcutaneous TID WC Vianne Bulls, MD   0 Units at 12/30/15 1745  . insulin detemir (LEVEMIR) injection 5 Units  5 Units Subcutaneous QHS Vianne Bulls, MD   5 Units at 12/30/15 2218  . iron polysaccharides (NIFEREX) capsule 150 mg  150 mg Oral Daily Vianne Bulls, MD   150 mg at 12/31/15 0937  . levothyroxine (SYNTHROID, LEVOTHROID) tablet 125 mcg  125 mcg Oral QAC breakfast Vianne Bulls, MD   125 mcg at 12/31/15 0824  . methocarbamol (ROBAXIN) tablet 500 mg  500 mg Oral Q6H PRN Vianne Bulls, MD      . metoprolol tartrate (LOPRESSOR) tablet 25 mg  25 mg Oral BID Vianne Bulls, MD   25 mg at 12/31/15 0937  . polyethylene glycol (MIRALAX / GLYCOLAX) packet 17 g  17 g Oral Daily PRN Vianne Bulls, MD      . pravastatin (PRAVACHOL) tablet 40 mg  40 mg Oral QPM Vianne Bulls, MD   40 mg at 12/30/15 1824  . sodium chloride flush (NS) 0.9 % injection 3 mL  3 mL Intravenous Q12H Vianne Bulls, MD   3 mL at 12/31/15 0940  . sodium chloride flush (NS) 0.9 % injection 3 mL  3 mL Intravenous PRN Vianne Bulls, MD      . traMADol (ULTRAM) tablet 50 mg  50 mg Oral TID PRN Vianne Bulls, MD   50 mg at 12/30/15 2218  . vancomycin  (VANCOCIN) 1,250 mg in sodium chloride 0.9 % 250 mL IVPB  1,250 mg Intravenous Q24H Ilene Qua Opyd, MD      . vitamin C (ASCORBIC ACID) tablet 500 mg  500 mg Oral q morning - 10a Vianne Bulls, MD   500 mg at 12/31/15 0937  . Warfarin - Pharmacist Dosing Inpatient   Does not apply Q24H Vianne Bulls, MD       Facility-Administered Medications Ordered in Other Encounters  Medication Dose Route Frequency Provider Last Rate Last Dose  . bupivacaine liposome (EXPAREL) 1.3 % injection 266 mg  20 mL Infiltration Once [employer], PA-C  Discharge Medications: Please see discharge summary for a list of discharge medications.  Relevant Imaging Results:  Relevant Lab Results:   Additional Information    Salome Arnt, [place]

## 2015-12-31 NOTE — Care Management Note (Signed)
Case Management Note  Patient Details  Name: [name] MRN: [id number] Date of Birth: [birth year]  Subjective/Objective : Patient is from [facility], anticipated discharge back to [facility]. CSW aware and following patient.                 Action/Plan: Anticipate no CM needs. Will cont. To follow until discharge.    Expected Discharge Date:    01/01/2016              Expected Discharge Plan:  [place]  In-House Referral:     Discharge planning Services  CM Consult  Post Acute Care Choice:  NA Choice offered to:  NA  DME Arranged:    DME Agency:     HH Arranged:    [place] Agency:     Status of Service:  Completed, signed off  Medicare Important Message Given:    Date Medicare IM Given:    Medicare IM give by:    Date Additional Medicare IM Given:    Additional Medicare Important Message give by:     If discussed at [place] of Stay Meetings, dates discussed:    Additional Comments:  Diallo Ponder, Chauncey Reading, RN 12/31/2015, 11:38 AM

## 2016-01-01 DIAGNOSIS — I5033 Acute on chronic diastolic (congestive) heart failure: Secondary | ICD-10-CM | POA: Diagnosis not present

## 2016-01-01 DIAGNOSIS — E1122 Type 2 diabetes mellitus with diabetic chronic kidney disease: Secondary | ICD-10-CM | POA: Diagnosis not present

## 2016-01-01 LAB — BASIC METABOLIC PANEL
ANION GAP: 8 (ref 5–15)
BUN: 34 mg/dL — ABNORMAL HIGH (ref 6–20)
CALCIUM: 8.5 mg/dL — AB (ref 8.9–10.3)
CO2: 24 mmol/L (ref 22–32)
Chloride: 101 mmol/L (ref 101–111)
Creatinine, Ser: 1.54 mg/dL — ABNORMAL HIGH (ref 0.44–1.00)
GFR, EST AFRICAN AMERICAN: 35 mL/min — AB (ref >60)
GFR, EST NON AFRICAN AMERICAN: 30 mL/min — AB (ref >60)
GLUCOSE: 167 mg/dL — AB (ref 65–99)
Potassium: 3.9 mmol/L (ref 3.5–5.1)
Sodium: 133 mmol/L — ABNORMAL LOW (ref 135–145)

## 2016-01-01 LAB — PROTIME-INR
INR: 3.42 — ABNORMAL HIGH (ref 0.00–1.49)
PROTHROMBIN TIME: 33.8 s — AB (ref 11.6–15.2)

## 2016-01-01 LAB — GLUCOSE, CAPILLARY
GLUCOSE-CAPILLARY: 165 mg/dL — AB (ref 65–99)
GLUCOSE-CAPILLARY: 158 mg/dL — AB (ref 65–99)

## 2016-01-01 MED ORDER — FUROSEMIDE 20 MG PO TABS: 20.0000 mg | ORAL_TABLET | Freq: Every day | ORAL | Status: DC

## 2016-01-01 MED ORDER — METOPROLOL TARTRATE 5 MG/5ML IV SOLN
2.5000 mg | Freq: Once | INTRAVENOUS | Status: AC
  Administered 2016-01-01: 2.5 mg via INTRAVENOUS
  Filled 2016-01-01: qty 5

## 2016-01-01 NOTE — Progress Notes (Deleted)
Pt states that ensure she received last night made her sick. No complaints of nausea or vomiting at this time.

## 2016-01-01 NOTE — Progress Notes (Deleted)
Pt up in chair.

## 2016-01-01 NOTE — Progress Notes (Signed)
Pt's heart rate consistently in the 130s to 140s. Was given scheduled 25mg  lopressor po about 1 hour ago. Heart rate continues to be elevated. She is asymptomatic at this time and in no acute distress. Paged midlevel. One time order received for iv lopressor. Will continue to monitor closely.

## 2016-01-01 NOTE — Discharge Summary (Signed)
Physician Discharge Summary  [name] [id number] DOB: 05/20/1933 DOA: 12/30/2015  PCP: Neale Burly, MD  Admit date: 12/30/2015 Discharge date: 01/01/2016  Time spent: 45 minutes  Recommendations for Outpatient Follow-up:  -Will be discharged back to SNF today. -Check renal function in about 3 days.   Discharge Diagnoses:  Principal Problem:   Acute on chronic diastolic CHF (congestive heart failure) (HCC) Active Problems:   Permanent atrial fibrillation (HCC)   Cellulitis and abscess of leg   CKD (chronic kidney disease), stage III   Insulin dependent diabetes mellitus ([place])   Essential hypertension   Hypothyroidism   Acute on chronic heart failure [facility])   Discharge Condition: Stable and improved  Filed Weights   12/30/15 1532 12/31/15 0649 01/01/16 0452  Weight: 103.874 kg (229 lb) 105.189 kg (231 lb 14.4 oz) 103.692 kg (228 lb 9.6 oz)    History of present illness:  As per Dr. Myna Hidalgo on 6/1: [name] is a 82 y.o. female with medical history significant for chronic diastolic CHF, coronary artery disease status post CABG, chronic atrial fibrillation on Coumadin, insulin-dependent diabetes mellitus, hypertension, and osteoarthritis status post left TKA approximately 6 months ago complicated by cellulitis currently under treatment with linezolid who presents to the ED at the direction of her cardiologist for evaluation and management of acute on chronic diastolic CHF in the setting of worsening renal function. Patient had been managed with torsemide 80 mg twice daily plus metolazone twice weekly until these agents were stopped approximately 2 weeks ago by her PCP out of concern for BUN in the 130 range. Over the past 2 weeks, patient has developed marked increase in bilateral lower extremity edema resulting in bilateral leg pain. She denies any chest pain, palpitations, dyspnea, or orthopnea. She was evaluated by her cardiologist in the clinic today, and given her  massive lower extremity edema and worsening renal function, direct admission for IV diuresis with close monitoring of renal function was arranged.  Patient is interviewed and examined on the telemetry unit at [facility] where she is afebrile, saturating well on room air, and with stable heart rate and blood pressure. She will be admitted to the telemetry unit for ongoing evaluation and management of acute on chronic diastolic CHF in the setting of worsening CKD.  Hospital Course:   Acute on chronic diastolic CHF -ECHO: EF 99991111, moderate LVH. -Wonder about accuracy of intake and output that is documented she appears to be positive. Nursing staff states that she is incontinent and is quite a heavy wetter. -Follow status appears compensated. -We'll discharge on Lasix 20 mg daily.  Stage III chronic kidney disease  -creatinine appears to be at baseline. -Has slightly increased from 1.3-1.5 likely due to diuresis. -We need to be closely monitored in the outpatient setting.  Chronic atrial fibrillation -Anticoagulated on Coumadin, rate controlled on Lopressor.  Cellulitis with abscess of left lower extremity. -Per notes from SNF, she has completed course of antibiotics, will discontinue at this time.   Coronary artery disease -No chest pain, stable, continue aspirin, Lopressor, statin next  Type 2 diabetes -Well-controlled  Hypothyroidism -Continue Synthroid  Hypertension -Fair control   Procedures:  None   Consultations:  None  Discharge Instructions  Discharge Instructions    Diet - low sodium heart healthy    Complete by:  As directed      Increase activity slowly    Complete by:  As directed  Medication List    STOP taking these medications        insulin aspart 100 UNIT/ML FlexPen  Commonly known as:  NOVOLOG     insulin aspart 100 UNIT/ML injection  Commonly known as:  novoLOG     levofloxacin 500 MG tablet  Commonly known as:   LEVAQUIN     linezolid 600 MG tablet  Commonly known as:  ZYVOX      TAKE these medications        aspirin EC 81 MG tablet  Take 81 mg by mouth daily.     cholecalciferol 1000 units tablet  Commonly known as:  VITAMIN D  Take 3,000 Units by mouth daily.     citalopram 20 MG tablet  Commonly known as:  CELEXA  Take 20 mg by mouth every morning.     docusate sodium 100 MG capsule  Commonly known as:  COLACE  Take 100 mg by mouth 2 (two) times daily.     fentaNYL 25 MCG/HR patch  Commonly known as:  DURAGESIC - dosed mcg/hr  Place 25 mcg onto the skin every 3 (three) days.     folic acid 1 MG tablet  Commonly known as:  FOLVITE  Take 1 mg by mouth every morning.     furosemide 20 MG tablet  Commonly known as:  LASIX  Take 1 tablet (20 mg total) by mouth daily.     LEVEMIR 100 UNIT/ML injection  Generic drug:  insulin detemir  Inject 10 Units into the skin at bedtime.     levothyroxine 125 MCG tablet  Commonly known as:  SYNTHROID, LEVOTHROID  Take 125 mcg by mouth daily before breakfast.     Melatonin 3 MG Caps  Take 1 capsule by mouth at bedtime.     metoprolol tartrate 25 MG tablet  Commonly known as:  LOPRESSOR  Take 25 mg by mouth 2 (two) times daily.     multivitamin with minerals Tabs tablet  Take 1 tablet by mouth daily.     NU-IRON 150 MG capsule  Generic drug:  iron polysaccharides  Take 150 mg by mouth daily.     polyethylene glycol packet  Commonly known as:  MIRALAX / GLYCOLAX  Take 17 g by mouth daily.     potassium chloride SA 20 MEQ tablet  Commonly known as:  K-DUR,KLOR-CON  Take 20 mEq by mouth 2 (two) times daily.     pravastatin 40 MG tablet  Commonly known as:  PRAVACHOL  Take 40 mg by mouth every evening.     warfarin 5 MG tablet  Commonly known as:  COUMADIN  Take 7.5 mg by mouth every evening. 7.5mg  on Mondays and Fridays. Take 5mg  on all other days       Allergies  Allergen Reactions  . Penicillins Other (See Comments)      Pt. Not sure of reaction.  Tresa Moore Hcl]        Follow-up Information    Follow up with Neale Burly, MD. Schedule an appointment as soon as possible for a visit in 2 weeks.   Specialty:  Internal Medicine   Contact information:   [place] [phone]        The results of significant diagnostics from this hospitalization (including imaging, microbiology, ancillary and laboratory) are listed below for reference.    Significant Diagnostic Studies: Dg Chest Port 1 View  12/31/2015  CLINICAL DATA: Acute CHF. EXAM: PORTABLE  CHEST 1 VIEW COMPARISON:  03/12/2015. FINDINGS: Cardiac pacer noted with lead tips in right atrium right ventricle. Prior CABG. Diffuse bilateral from interstitial prominence. Small left pleural effusion cannot be excluded. These findings suggest congestive heart failure. Underlying chronic interstitial lung disease also most likely present. No pneumothorax. IMPRESSION: 1. Cardiac pacer stable position. Prior CABG. Cardiomegaly with diffuse bilateral from interstitial prominence and small left pleural effusions suggesting congestive heart failure. 2.  Underlying chronic interstitial lung disease. Electronically Signed   By: Marcello Moores  Register   On: 12/31/2015 07:05    Microbiology: Recent Results (from the past 240 hour(s))  MRSA PCR Screening     Status: None   Collection Time: 12/30/15  4:07 PM  Result Value Ref Range Status   MRSA by PCR NEGATIVE NEGATIVE Final    Comment:        The GeneXpert MRSA Assay (FDA approved for NASAL specimens only), is one component of a comprehensive MRSA colonization surveillance program. It is not intended to diagnose MRSA infection nor to guide or monitor treatment for MRSA infections.      Labs: Basic Metabolic Panel:  Recent Labs Lab 12/30/15 1735 12/31/15 0606 01/01/16 0603  NA 135 138 133*  K 4.6 4.3 3.9  CL 105 107 101  CO2 24 25 24   GLUCOSE 99 81 167*  BUN  29* 30* 34*  CREATININE 1.40* 1.35* 1.54*  CALCIUM 8.8* 8.8* 8.5*  MG 2.0  --   --    Liver Function Tests:  Recent Labs Lab 12/30/15 1735  AST 19  ALT 16  ALKPHOS 61  BILITOT 0.4  PROT 7.3  ALBUMIN 2.6*   No results for input(s): LIPASE, AMYLASE in the last 168 hours. No results for input(s): AMMONIA in the last 168 hours. CBC:  Recent Labs Lab 12/30/15 1735  WBC 7.3  NEUTROABS 5.5  HGB 9.4*  HCT 30.9*  MCV 82.8  PLT 307   Cardiac Enzymes: No results for input(s): CKTOTAL, CKMB, CKMBINDEX, TROPONINI in the last 168 hours. BNP: BNP (last 3 results)  Recent Labs  12/30/15 1735  BNP 616.0*    ProBNP (last 3 results) No results for input(s): PROBNP in the last 8760 hours.  CBG:  Recent Labs Lab 12/31/15 1202 12/31/15 1659 12/31/15 2149 01/01/16 0747 01/01/16 1053  GLUCAP 106* 187* 229* 165* 158*       Signed:  HERNANDEZ ACOSTA,ESTELA  [facility] Pager: [phone] 01/01/2016, 11:43 AM

## 2016-01-01 NOTE — Progress Notes (Signed)
Pt's IV catheter removed and intact. Pt's IV site clean dry and intact. Report called and given to [facility] All questions were answered and no further questions at this time. Pt in stable condition and in no acute distress at time of discharge. Pt will be escorted via [facility].

## 2016-01-01 NOTE — Progress Notes (Signed)
[place] for Warfarin Indication: atrial fibrillation  Allergies  Allergen Reactions  . Penicillins Other (See Comments)    Pt. Not sure of reaction.  Tresa Moore Hcl]    Patient Measurements: Height: 5\' 4"  (162.6 cm) Weight: 228 lb 9.6 oz (103.692 kg) IBW/kg (Calculated) : 54.7  Vital Signs: Temp: 99.2 F (37.3 C) (06/03 0454) Temp Source: Oral (06/03 0454) BP: 126/63 mmHg (06/03 1043) Pulse Rate: 116 (06/03 1043)  Labs:  Recent Labs  12/30/15 1735 12/31/15 0606 01/01/16 0603  HGB 9.4*  --   --   HCT 30.9*  --   --   PLT 307  --   --   LABPROT 25.8* 24.9* 33.8*  INR 2.39* 2.28* 3.42*  CREATININE 1.40* 1.35* 1.54*   Estimated Creatinine Clearance: 33.6 mL/min (by C-G formula based on Cr of 1.54).  Medical History: Past Medical History  Diagnosis Date  . Anemia 2008  . DM (diabetes mellitus) ([place]) 1999  . Cataract     bilaterla   . Permanent atrial fibrillation ([place])   . CAD (coronary artery disease)   . CRI (chronic renal insufficiency) 2008  . Hypertension   . Hyperlipemia   . GERD (gastroesophageal reflux disease)   . Arthritis   . Acute respiratory failure with hypoxia ([place]) 06/12/2013    secondary to acute on chronic diastolic failure.  . Wrist fracture, left 06/13/2013    03-12-15 no residual issues  . Acute on chronic congestive heart failure with left ventricular diastolic dysfunction ([place]) 06/11/2013  . Hx of CABG 10/24/2007    Dr. Roxan Hockey  . S/P CABG x 4   . Pacemaker 10/24/2007    Medtronic EnRhythm; implanted for SSS, symptomatic bradycardia, PAF  . Fracture November 2014    Left wrist-03-12-15 no issues now  . Obesity   . Transfusion history   . Venous insufficiency     8-12-16left lower lateral outside healed leg ulcer-scabbed over.   Medications:  Prescriptions prior to admission  Medication Sig Dispense Refill Last Dose  . aspirin EC 81 MG tablet Take 81 mg by mouth daily.    12/30/2015 at Unknown time  . cholecalciferol (VITAMIN D) 1000 UNITS tablet Take 3,000 Units by mouth daily.   12/30/2015 at Unknown time  . citalopram (CELEXA) 20 MG tablet Take 20 mg by mouth every morning.    12/30/2015 at Unknown time  . docusate sodium (COLACE) 100 MG capsule Take 100 mg by mouth 2 (two) times daily.   12/30/2015 at Unknown time  . fentaNYL (DURAGESIC - DOSED MCG/HR) 25 MCG/HR patch Place 25 mcg onto the skin every 3 (three) days.   [id number]  . folic acid (FOLVITE) 1 MG tablet Take 1 mg by mouth every morning.    12/30/2015 at Unknown time  . insulin aspart (NOVOLOG) 100 UNIT/ML FlexPen Inject 1-10 Units into the skin 3 (three) times daily with meals. Sliding scale 70-100=0 units 101-150=1 unit 151-200=2 units 201-250= 4 units 251-300=6 units 301-350=8 units 350< = 10 units   12/30/2015 at Unknown time  . insulin aspart (NOVOLOG) 100 UNIT/ML injection Inject 5 Units into the skin 2 (two) times daily.    12/30/2015 at Unknown time  . iron polysaccharides (NU-IRON) 150 MG capsule Take 150 mg by mouth daily.   12/30/2015 at Unknown time  . LEVEMIR 100 UNIT/ML injection Inject 10 Units into the skin at bedtime.    12/29/2015 at Unknown time  . levofloxacin (LEVAQUIN) 500 MG tablet Take  500 mg by mouth daily.   12/29/2015 at Unknown time  . levothyroxine (SYNTHROID, LEVOTHROID) 125 MCG tablet Take 125 mcg by mouth daily before breakfast.   12/30/2015 at Unknown time  . linezolid (ZYVOX) 600 MG tablet Take 600 mg by mouth 2 (two) times daily.   12/30/2015 at Unknown time  . Melatonin 3 MG CAPS Take 1 capsule by mouth at bedtime.   12/29/2015 at Unknown time  . metoprolol tartrate (LOPRESSOR) 25 MG tablet Take 25 mg by mouth 2 (two) times daily.    12/30/2015 at [place]  . Multiple Vitamin (MULTIVITAMIN WITH MINERALS) TABS tablet Take 1 tablet by mouth daily.   12/30/2015 at Unknown time  . polyethylene glycol (MIRALAX / GLYCOLAX) packet Take 17 g by mouth daily.    12/30/2015 at Unknown time  . potassium  chloride SA (K-DUR,KLOR-CON) 20 MEQ tablet Take 20 mEq by mouth 2 (two) times daily.    12/30/2015 at Unknown time  . pravastatin (PRAVACHOL) 40 MG tablet Take 40 mg by mouth every evening.    12/29/2015 at Unknown time  . warfarin (COUMADIN) 5 MG tablet Take 7.5 mg by mouth every evening. 7.5mg  on Mondays and Fridays. Take 5mg  on all other days   12/29/2015 at Unknown time   Assessment: Okay for Protocol, INR 3.42 , above goal.    Goal of Therapy:  INR 2-3   Plan:  No coumadin today Daily PT/INR  Isac Sarna, BS Vena [place], BCPS Clinical Pharmacist Pager [phone]  01/01/2016,1:02 PM

## 2016-02-04 ENCOUNTER — Encounter: Payer: Medicare HMO | Admitting: Internal Medicine

## 2016-02-11 ENCOUNTER — Encounter: Payer: Medicare HMO | Admitting: Internal Medicine

## 2016-02-17 ENCOUNTER — Encounter: Payer: Self-pay | Admitting: *Deleted

## 2016-02-28 ENCOUNTER — Encounter: Payer: Medicare HMO | Admitting: Internal Medicine

## 2016-03-03 ENCOUNTER — Ambulatory Visit (INDEPENDENT_AMBULATORY_CARE_PROVIDER_SITE_OTHER): Payer: Medicare HMO | Admitting: Internal Medicine

## 2016-03-03 ENCOUNTER — Encounter: Payer: Self-pay | Admitting: Internal Medicine

## 2016-03-03 VITALS — BP 122/71 | HR 74 | Ht 64.0 in | Wt 226.4 lb

## 2016-03-03 DIAGNOSIS — I5032 Chronic diastolic (congestive) heart failure: Secondary | ICD-10-CM

## 2016-03-03 DIAGNOSIS — I482 Chronic atrial fibrillation, unspecified: Secondary | ICD-10-CM

## 2016-03-03 DIAGNOSIS — Z950 Presence of cardiac pacemaker: Secondary | ICD-10-CM

## 2016-03-03 DIAGNOSIS — I10 Essential (primary) hypertension: Secondary | ICD-10-CM | POA: Diagnosis not present

## 2016-03-03 LAB — CUP PACEART INCLINIC DEVICE CHECK
Brady Statistic RV Percent Paced: 17.87 %
Implantable Lead Implant Date: 20090326
Implantable Lead Implant Date: 20090326
Lead Channel Impedance Value: 392 Ohm
Lead Channel Impedance Value: 672 Ohm
Lead Channel Pacing Threshold Pulse Width: 0.4 ms
Lead Channel Sensing Intrinsic Amplitude: 0.358 mV
Lead Channel Setting Pacing Amplitude: 2.5 V
Lead Channel Setting Pacing Pulse Width: 0.4 ms
MDC IDC LEAD LOCATION: 753859
MDC IDC LEAD LOCATION: 753860
MDC IDC MSMT BATTERY VOLTAGE: 2.85 V
MDC IDC MSMT LEADCHNL RV PACING THRESHOLD AMPLITUDE: 1 V
MDC IDC MSMT LEADCHNL RV SENSING INTR AMPL: 10.115 mV
MDC IDC SESS DTM: 20170804093525
MDC IDC SET LEADCHNL RV SENSING SENSITIVITY: 0.9 mV

## 2016-03-03 NOTE — Patient Instructions (Addendum)
Medication Instructions:   Your physician recommends that you continue on your current medications as directed. Please refer to the Current Medication list given to you today.  Labwork: NONE  Testing/Procedures: NONE  Follow-Up:  Your physician recommends that you schedule a follow-up appointment in: 6 months in the device clinic. You will receive a reminder letter in the mail in about 4 months reminding you to call and schedule your appointment. If you don't receive this letter, please contact our office. Your physician recommends that you schedule a follow-up appointment in: 1 year with Dr. Rayann Heman. Please schedule this appointment today before leaving the office.   Any Other Special Instructions Will Be Listed Below (If Applicable).  Please schedule a follow up appointment with Dr. Bronson Ing in 4 weeks.  If you need a refill on your cardiac medications before your next appointment, please call your pharmacy.

## 2016-03-05 NOTE — Progress Notes (Signed)
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 female with a h/o bradycardia sp PPM (MDT) by Dr Rollene Fare who presents today to follow-up in the Electrophysiology device clinic.  She has multiple chronic health concerns and is a nursing home resident.  She has had progression to permanent atrial fibrillation.  She is not very active and has difficulty with chronic edema/ venous insufficiency.  She was hospitalized in June.  Due to follow-up with Dr Bronson Ing. Today, she  denies symptoms of palpitations, chest pain, shortness of breath,   dizziness, presyncope, syncope, or neurologic sequela.  The patientis tolerating medications without difficulties and is otherwise without complaint today.   Past Medical History:  Diagnosis Date  . Acute on chronic congestive heart failure with left ventricular diastolic dysfunction ([place]) 06/11/2013  . Acute respiratory failure with hypoxia ([place]) 06/12/2013   secondary to acute on chronic diastolic failure.  . Anemia 2008  . Arthritis   . CAD (coronary artery disease)   . Cataract    bilaterla   . CRI (chronic renal insufficiency) 2008  . DM (diabetes mellitus) ([place]) 1999  . Fracture November 2014   Left wrist-03-12-15 no issues now  . GERD (gastroesophageal reflux disease)   . Hx of CABG 10/24/2007   Dr. Roxan Hockey  . Hyperlipemia   . Hypertension   . Obesity   . Pacemaker 10/24/2007   Medtronic EnRhythm; implanted for SSS, symptomatic bradycardia, PAF  . Permanent atrial fibrillation ([place])   . S/P CABG x 4   . Transfusion history   . Venous insufficiency    8-12-16left lower lateral outside healed leg ulcer-scabbed over.  . Wrist fracture, left 06/13/2013   03-12-15 no residual issues   Past Surgical History:  Procedure Laterality Date  . APPENDECTOMY     APPENDICITIS  . BACK SURGERY    . CARDIAC CATHETERIZATION  08/08/2007   3 vessel CAD (LAD, mid Cfx, mid RCA) (Dr. Marella Chimes)  . CATARACT  EXTRACTION, BILATERAL    . COLONOSCOPY WITH ESOPHAGOGASTRODUODENOSCOPY (EGD) N/A 11/25/2012   SLF:Two SMALL AC polyps/Small internal hemorrhoids/The colon IS SLIGHTLY redundant-EGD:Schatzki ring at the gastroesophageal junction/Small hiatal hernia/MILD Non-erosive gastritis  . CORONARY ANGIOPLASTY  11/16/2003   PCI to prox mid RCA (2.75x47mm chromium cobalt vision Guidant stent) (Dr. Marella Chimes)  . CORONARY ARTERY BYPASS GRAFT  10/24/2007   LIMA to LAD, SVG to DX1, SVG to OM, SVG to distal RCA (Dr. Roxan Hockey)  . ESOPHAGOGASTRODUODENOSCOPY  11/07/2007   [id number] Schatzki's ring.  Otherwise normal esophagus   . EYE SURGERY Left Aug. 2015   torned retina repair  . INSERT / REPLACE / REMOVE PACEMAKER    . JOINT REPLACEMENT Right   . NM MYOCAR PERF WALL MOTION  2011   dipyridamole myoview - moderate ischemai in mid anteroseptal basal inferior inferioer, mid lateral and basal inferolateral region; abnormal study, EF 41%; high risk scan  . PACEMAKER INSERTION  10/24/2007   MDT EnRhythm implanted by Dr Rollene Fare  . TONSILLECTOMY     age 21  . TOTAL KNEE ARTHROPLASTY Right    2012  . TOTAL KNEE ARTHROPLASTY Left 03/19/2015   Procedure: LEFT TOTAL KNEE ARTHROPLASTY;  Surgeon: Latanya Maudlin, MD;  Location: [facility];  Service: Orthopedics;  Laterality: Left;  . TRANSTHORACIC ECHOCARDIOGRAM  08/2012   EF 55-65%, mod conc hypertrophy; mild AV regurg; calcified MV annulus with mod MR; LA mod diated; RV systolic pressure increased; RA mildly dilated; PA  peak pressure 5mmHg  . TUBAL LIGATION      Social History   Social History  . Marital status: Widowed    Spouse name: N/A  . Number of children: 4  . Years of education: N/A   Occupational History  .  Retired   Social History Main Topics  . Smoking status: Former Smoker    Packs/day: 1.00    Years: 4.00    Types: Cigarettes    Quit date: 10/20/1999  . Smokeless tobacco: Never Used  . Alcohol use No  . Drug use: No  . Sexual activity: No     Other Topics Concern  . Not on file   Social History Narrative   Nursing home resident    Family History  Problem Relation Age of Onset  . Colon cancer Neg Hx   . Colon polyps Neg Hx   . Hodgkin's lymphoma Mother   . Diabetes Son   . Stroke Father     Allergies  Allergen Reactions  . Penicillins Other (See Comments)    Pt. Not sure of reaction.  Tresa Moore Hcl]     Current Outpatient Prescriptions  Medication Sig Dispense Refill  . aspirin EC 81 MG tablet Take 81 mg by mouth daily.    . cholecalciferol (VITAMIN D) 1000 UNITS tablet Take 3,000 Units by mouth daily.    . citalopram (CELEXA) 20 MG tablet Take 20 mg by mouth every morning.     . docusate sodium (COLACE) 100 MG capsule Take 100 mg by mouth 2 (two) times daily.    . fentaNYL (DURAGESIC - DOSED MCG/HR) 25 MCG/HR patch Place 25 mcg onto the skin every 3 (three) days.    . folic acid (FOLVITE) 1 MG tablet Take 1 mg by mouth every morning.     . furosemide (LASIX) 20 MG tablet Take 1 tablet (20 mg total) by mouth daily. (Patient taking differently: Take 20 mg by mouth 2 (two) times daily. ) 30 tablet 0  . iron polysaccharides (NU-IRON) 150 MG capsule Take 150 mg by mouth daily.    Marland Kitchen LEVEMIR 100 UNIT/ML injection Inject 10 Units into the skin at bedtime.     Marland Kitchen levothyroxine (SYNTHROID, LEVOTHROID) 125 MCG tablet Take 125 mcg by mouth daily before breakfast.    . Melatonin 3 MG CAPS Take 1 capsule by mouth at bedtime.    . metoprolol tartrate (LOPRESSOR) 25 MG tablet Take 25 mg by mouth 2 (two) times daily.     . Multiple Vitamin (MULTIVITAMIN WITH MINERALS) TABS tablet Take 1 tablet by mouth daily.    . polyethylene glycol (MIRALAX / GLYCOLAX) packet Take 17 g by mouth daily.     . potassium chloride SA (K-DUR,KLOR-CON) 20 MEQ tablet Take 20 mEq by mouth 2 (two) times daily.     . pravastatin (PRAVACHOL) 40 MG tablet Take 40 mg by mouth every evening.     . warfarin (COUMADIN) 5 MG tablet Take 7.5 mg by  mouth every evening. 7.5mg  on Mondays and Fridays. Take 5mg  on all other days     No current facility-administered medications for this visit.    Facility-Administered Medications Ordered in Other Visits  Medication Dose Route Frequency Provider Last Rate Last Dose  . bupivacaine liposome (EXPAREL) 1.3 % injection 266 mg  20 mL Infiltration Once [employer], PA-C        ROS- all systems are reviewed and negative except as per HPI  Physical Exam: Vitals:   03/03/16  0844  BP: 122/71  Pulse: 74  SpO2: 96%  Weight: 226 lb 6.4 oz (102.7 kg)  Height: 5\' 4"  (1.626 m)    GEN- The patient is elderly appearing, alert and oriented x 3 today.   Head- normocephalic, atraumatic Eyes-  Sclera clear, conjunctiva pink Ears- hearing intact Oropharynx- clear Neck- supple  Lungs- Clear to ausculation bilaterally, normal work of breathing Chest- pacemaker pocket is well healed Heart- irregular rate and rhythm  GI- soft, NT, ND, + BS Extremities- no clubbing, cyanosis, + dependant edema MS- diffuse muscle atrophy Skin- no rash or lesion Psych- euthymic mood, full affect Neuro- strength and sensation are intact  Pacemaker interrogation- reviewed in detail today,  See PACEART report  Assessment and Plan:  1. Symptomatic bradycardia Normal pacemaker function See Pace Art report No changes today  2. Permanent atrial fibrillation chad2vasc score is at least 6.  Continue long term anticoagulation  3. CAD Stable No change required today  4. Obesity Body mass index is 38.86 kg/m. Weight loss is advised  5. Chronic venous insufficiency Stable No change required today  Return to the device clinic in 6 months I will see in a year Due to follow-up with Dr Bronson Ing Appointment made today  Thompson Grayer MD, [facility] 03/05/2016 9:33 AM

## 2016-03-15 ENCOUNTER — Encounter: Payer: Medicare HMO | Admitting: Internal Medicine

## 2016-03-24 ENCOUNTER — Ambulatory Visit: Payer: Medicare HMO | Admitting: Family

## 2016-03-24 ENCOUNTER — Encounter (HOSPITAL_COMMUNITY): Payer: Medicare HMO

## 2016-04-10 ENCOUNTER — Ambulatory Visit: Payer: Medicare HMO | Admitting: Cardiovascular Disease

## 2016-04-13 ENCOUNTER — Encounter: Payer: Self-pay | Admitting: Cardiovascular Disease

## 2016-04-13 ENCOUNTER — Ambulatory Visit (INDEPENDENT_AMBULATORY_CARE_PROVIDER_SITE_OTHER): Payer: Medicare HMO | Admitting: Cardiovascular Disease

## 2016-04-13 ENCOUNTER — Encounter: Payer: Self-pay | Admitting: Family

## 2016-04-13 VITALS — BP 136/80 | HR 85 | Ht 67.0 in | Wt 225.0 lb

## 2016-04-13 DIAGNOSIS — I5033 Acute on chronic diastolic (congestive) heart failure: Secondary | ICD-10-CM

## 2016-04-13 DIAGNOSIS — I10 Essential (primary) hypertension: Secondary | ICD-10-CM | POA: Diagnosis not present

## 2016-04-13 DIAGNOSIS — I482 Chronic atrial fibrillation, unspecified: Secondary | ICD-10-CM

## 2016-04-13 DIAGNOSIS — I739 Peripheral vascular disease, unspecified: Secondary | ICD-10-CM

## 2016-04-13 DIAGNOSIS — R0989 Other specified symptoms and signs involving the circulatory and respiratory systems: Secondary | ICD-10-CM

## 2016-04-13 DIAGNOSIS — R0602 Shortness of breath: Secondary | ICD-10-CM

## 2016-04-13 DIAGNOSIS — N183 Chronic kidney disease, stage 3 unspecified: Secondary | ICD-10-CM

## 2016-04-13 DIAGNOSIS — I25812 Atherosclerosis of bypass graft of coronary artery of transplanted heart without angina pectoris: Secondary | ICD-10-CM

## 2016-04-13 DIAGNOSIS — Z950 Presence of cardiac pacemaker: Secondary | ICD-10-CM

## 2016-04-13 MED ORDER — FUROSEMIDE 40 MG PO TABS: 40.0000 mg | ORAL_TABLET | Freq: Every day | ORAL | Status: AC

## 2016-04-13 NOTE — Patient Instructions (Addendum)
Medication Instructions:   Increase Lasix to 40mg  daily.  Continue all other medications.    Labwork:  BMET - due 04/17/2016.   Office will contact with results via phone or letter.    Testing/Procedures: None  Referrals:  Nephrology - Dr. Donato Heinz [facility]  Follow-Up: 3 months   Any Other Special Instructions Will Be Listed Below (If Applicable).  If you need a refill on your cardiac medications before your next appointment, please call your pharmacy.

## 2016-04-13 NOTE — Progress Notes (Signed)
SUBJECTIVE: Patient presents for routine follow-up. Hospitalized for acute on chronic diastolic heart failure in June 2017.  Echocardiogram 12/31/15 normal left ventricular systolic function, LVEF 99991111, ischemic wall motion abnormalities, moderate LVH, severely elevated pulmonary pressures.  Wt 225 (226 at Dr. Jackalyn Lombard visit, 229 at discharge in June).  Legs are swollen. Using oxygen. Denies chest pain.   Review of Systems: As per "subjective", otherwise negative.  Allergies  Allergen Reactions  . Penicillins Other (See Comments)    Pt. Not sure of reaction.  Tresa Moore Hcl]     Current Outpatient Prescriptions  Medication Sig Dispense Refill  . aspirin EC 81 MG tablet Take 81 mg by mouth daily.    . cholecalciferol (VITAMIN D) 1000 UNITS tablet Take 3,000 Units by mouth daily.    . ciprofloxacin (CIPRO) 750 MG tablet Take 750 mg by mouth 2 (two) times daily.    . citalopram (CELEXA) 20 MG tablet Take 20 mg by mouth every morning.     . docusate sodium (COLACE) 100 MG capsule Take 100 mg by mouth 2 (two) times daily.    . fentaNYL (DURAGESIC - DOSED MCG/HR) 25 MCG/HR patch Place 25 mcg onto the skin every 3 (three) days.    . folic acid (FOLVITE) 1 MG tablet Take 1 mg by mouth every morning.     . furosemide (LASIX) 20 MG tablet Take 1 tablet (20 mg total) by mouth daily. (Patient taking differently: Take 20 mg by mouth 2 (two) times daily. ) 30 tablet 0  . iron polysaccharides (NU-IRON) 150 MG capsule Take 150 mg by mouth daily.    Marland Kitchen LEVEMIR 100 UNIT/ML injection Inject 10 Units into the skin at bedtime.     Marland Kitchen levothyroxine (SYNTHROID, LEVOTHROID) 125 MCG tablet Take 125 mcg by mouth daily before breakfast.    . Melatonin 3 MG CAPS Take 1 capsule by mouth at bedtime.    . metoprolol tartrate (LOPRESSOR) 25 MG tablet Take 25 mg by mouth 2 (two) times daily.     . Multiple Vitamin (MULTIVITAMIN WITH MINERALS) TABS tablet Take 1 tablet by mouth daily.    .  polyethylene glycol (MIRALAX / GLYCOLAX) packet Take 17 g by mouth daily.     . potassium chloride SA (K-DUR,KLOR-CON) 20 MEQ tablet Take 20 mEq by mouth 2 (two) times daily.     . pravastatin (PRAVACHOL) 40 MG tablet Take 40 mg by mouth every evening.     . promethazine (PHENERGAN) 12.5 MG tablet Take 12.5 mg by mouth every 8 (eight) hours as needed for nausea or vomiting.    . warfarin (COUMADIN) 5 MG tablet Take 7.5 mg by mouth every evening. 7.5mg  on Mondays and Fridays. Take 5mg  on all other days     No current facility-administered medications for this visit.    Facility-Administered Medications Ordered in Other Visits  Medication Dose Route Frequency Provider Last Rate Last Dose  . bupivacaine liposome (EXPAREL) 1.3 % injection 266 mg  20 mL Infiltration Once Ardeen Jourdain, PA-C        Past Medical History:  Diagnosis Date  . Acute on chronic congestive heart failure with left ventricular diastolic dysfunction ([place]) 06/11/2013  . Acute respiratory failure with hypoxia ([place]) 06/12/2013   secondary to acute on chronic diastolic failure.  . Anemia 2008  . Arthritis   . CAD (coronary artery disease)   . Cataract    bilaterla   . CRI (chronic renal insufficiency) 2008  .  DM (diabetes mellitus) ([place]) 1999  . Fracture November 2014   Left wrist-03-12-15 no issues now  . GERD (gastroesophageal reflux disease)   . Hx of CABG 10/24/2007   Dr. Roxan Hockey  . Hyperlipemia   . Hypertension   . Obesity   . Pacemaker 10/24/2007   Medtronic EnRhythm; implanted for SSS, symptomatic bradycardia, PAF  . Permanent atrial fibrillation ([place])   . S/P CABG x 4   . Transfusion history   . Venous insufficiency    8-12-16left lower lateral outside healed leg ulcer-scabbed over.  . Wrist fracture, left 06/13/2013   03-12-15 no residual issues    Past Surgical History:  Procedure Laterality Date  . APPENDECTOMY     APPENDICITIS  . BACK SURGERY    . CARDIAC CATHETERIZATION  08/08/2007   3  vessel CAD (LAD, mid Cfx, mid RCA) (Dr. Marella Chimes)  . CATARACT EXTRACTION, BILATERAL    . COLONOSCOPY WITH ESOPHAGOGASTRODUODENOSCOPY (EGD) N/A 11/25/2012   SLF:Two SMALL AC polyps/Small internal hemorrhoids/The colon IS SLIGHTLY redundant-EGD:Schatzki ring at the gastroesophageal junction/Small hiatal hernia/MILD Non-erosive gastritis  . CORONARY ANGIOPLASTY  11/16/2003   PCI to prox mid RCA (2.75x70mm chromium cobalt vision Guidant stent) (Dr. Marella Chimes)  . CORONARY ARTERY BYPASS GRAFT  10/24/2007   LIMA to LAD, SVG to DX1, SVG to OM, SVG to distal RCA (Dr. Roxan Hockey)  . ESOPHAGOGASTRODUODENOSCOPY  11/07/2007   [id number] Schatzki's ring.  Otherwise normal esophagus   . EYE SURGERY Left Aug. 2015   torned retina repair  . INSERT / REPLACE / REMOVE PACEMAKER    . JOINT REPLACEMENT Right   . NM MYOCAR PERF WALL MOTION  2011   dipyridamole myoview - moderate ischemai in mid anteroseptal basal inferior inferioer, mid lateral and basal inferolateral region; abnormal study, EF 41%; high risk scan  . PACEMAKER INSERTION  10/24/2007   MDT EnRhythm implanted by Dr Rollene Fare  . TONSILLECTOMY     age 89  . TOTAL KNEE ARTHROPLASTY Right    2012  . TOTAL KNEE ARTHROPLASTY Left 03/19/2015   Procedure: LEFT TOTAL KNEE ARTHROPLASTY;  Surgeon: Latanya Maudlin, MD;  Location: [facility];  Service: Orthopedics;  Laterality: Left;  . TRANSTHORACIC ECHOCARDIOGRAM  08/2012   EF 55-65%, mod conc hypertrophy; mild AV regurg; calcified MV annulus with mod MR; LA mod diated; RV systolic pressure increased; RA mildly dilated; PA peak pressure 34mmHg  . TUBAL LIGATION      Social History   Social History  . Marital status: Widowed    Spouse name: N/A  . Number of children: 4  . Years of education: N/A   Occupational History  .  Retired   Social History Main Topics  . Smoking status: Former Smoker    Packs/day: 1.00    Years: 4.00    Types: Cigarettes    Quit date: 10/20/1999  . Smokeless tobacco:  Never Used  . Alcohol use No  . Drug use: No  . Sexual activity: No   Other Topics Concern  . Not on file   Social History Narrative   Nursing home resident     Vitals:   04/13/16 1428  BP: 136/80  Pulse: 85  SpO2: 99%  Weight: 225 lb (102.1 kg)  Height: 5\' 7"  (1.702 m)    PHYSICAL EXAM General: NAD, using oxygen by [place] HEENT: Normal. Neck: No JVD, no thyromegaly. Lungs: Bilateral crackles throughout CV: Nondisplaced PMI.  Regular rate and irregular rhythm, normal 99991111, no S3, systolic murmur along LSB. Legs  bandaged, gross pretibial edema b/l. Abdomen: Soft, obese.  Neurologic: Alert and oriented.     ECG: Most recent ECG reviewed.      ASSESSMENT AND PLAN: 1. CAD/CABG: She remains symptomatically stable. Normal LV systolic function by echocardiogram 12/2015. Myocardial perfusion images on 10/19/14 demonstrated a mild to moderate amount of anteroapical and anterolateral wall ischemia with anterolateral wall hypokinesis.  Continue ASA, beta blocker, and pravastatin.   2. Permanent atrial fibrillation: Rate is controlled on metoprolol 25 mg twice daily. Maintained on warfarin, with INR managed by pharmacy.   3. Pacemaker for SSS: Normal device function. Follows with Dr. Rayann Heman.  4. Acute on chronic diastolic heart failure: Will obtain chest x-ray. I will increase Lasix from 20 to 40 mg daily. Will check basic metabolic panel this upcoming Monday, September 18. I will obtain a chest x-ray today. Diuretics have been difficult to manage in the past due to marked renal dysfunction. I will make a nephrology referral.  5. Essential HTN: Controlled. No changes.  6. Peripheral vascular disease: Follows with Dr. Bridgett Larsson.  Dispo: fu 3 months.  Kate Sable, M.D., F.A.C.C.

## 2016-04-13 NOTE — Addendum Note (Signed)
Addended by: Laurine Blazer on: 04/13/2016 03:01 PM   Modules accepted: Orders

## 2016-04-15 ENCOUNTER — Other Ambulatory Visit: Payer: Self-pay | Admitting: *Deleted

## 2016-04-15 DIAGNOSIS — I739 Peripheral vascular disease, unspecified: Secondary | ICD-10-CM

## 2016-04-19 ENCOUNTER — Ambulatory Visit (HOSPITAL_COMMUNITY): Payer: Medicare HMO

## 2016-04-19 ENCOUNTER — Ambulatory Visit: Payer: Medicare HMO | Admitting: Family

## 2016-05-09 ENCOUNTER — Encounter: Payer: Self-pay | Admitting: Family

## 2016-05-12 ENCOUNTER — Encounter: Payer: Self-pay | Admitting: Family

## 2016-05-12 ENCOUNTER — Ambulatory Visit (INDEPENDENT_AMBULATORY_CARE_PROVIDER_SITE_OTHER): Payer: Medicare HMO | Admitting: Family

## 2016-05-12 ENCOUNTER — Ambulatory Visit (HOSPITAL_COMMUNITY)
Admission: RE | Admit: 2016-05-12 | Discharge: 2016-05-12 | Disposition: A | Discharge status: Home / Self Care | Payer: Medicare HMO | Source: Ambulatory Visit | Attending: Vascular Surgery | Admitting: Vascular Surgery

## 2016-05-12 VITALS — BP 146/87 | HR 76 | Temp 97.1°F | Resp 18 | Ht 67.0 in | Wt 225.0 lb

## 2016-05-12 DIAGNOSIS — I872 Venous insufficiency (chronic) (peripheral): Secondary | ICD-10-CM | POA: Diagnosis not present

## 2016-05-12 DIAGNOSIS — I779 Disorder of arteries and arterioles, unspecified: Secondary | ICD-10-CM

## 2016-05-12 DIAGNOSIS — E1151 Type 2 diabetes mellitus with diabetic peripheral angiopathy without gangrene: Secondary | ICD-10-CM | POA: Diagnosis not present

## 2016-05-12 DIAGNOSIS — I10 Essential (primary) hypertension: Secondary | ICD-10-CM | POA: Insufficient documentation

## 2016-05-12 DIAGNOSIS — E785 Hyperlipidemia, unspecified: Secondary | ICD-10-CM | POA: Diagnosis not present

## 2016-05-12 DIAGNOSIS — I739 Peripheral vascular disease, unspecified: Secondary | ICD-10-CM | POA: Diagnosis not present

## 2016-05-12 DIAGNOSIS — Z87891 Personal history of nicotine dependence: Secondary | ICD-10-CM | POA: Insufficient documentation

## 2016-05-12 NOTE — Progress Notes (Signed)
[facility] [place]   CC: Follow up peripheral artery occlusive disease  History of Present Illness [name] is a 82 y.o. female patient of Dr. Bridgett Larsson who presents with chief complaint: healing uclers, PAD, and chronic venous insufficiency. The patient has no rest pain. Her bilateral achilles ulcers and left shin ulcers have healed.   The patient notes symptoms have improved. The patient's treatment regimen currently included: maximal medical management.  She is in [facility] facility since August of 2016 when she had a left total knee replacement by Dr. Gladstone Lighter. Dr. Gladstone Lighter is also managing the healing wound of her left knee, she has been receiving wound care at the nursing facility under Dr. Charlestine Night direction per pt and the nursing staff member with her, Colletta Maryland.  Pt denies fever or chills, denies pain.  She states she was receiving physical therapy.  Pt states she plans to live at the nursing facility the remainder of her life.  Dr. Jacinta Shoe is her cardiologist; he apparently adjusted her torsemide due to the edema in her legs.   Pt Diabetic: Yes, pt states her glucose stays less than 200 Pt smoker: former-smoker, quit in 2001   Pt meds include:  Statin :Yes  Betablocker: Yes  ASA: Yes  Other anticoagulants/antiplatelets: coumadin, history of atrial fib   Past Medical History:  Diagnosis Date  . Acute on chronic congestive heart failure with left ventricular diastolic dysfunction ([place]) 06/11/2013  . Acute respiratory failure with hypoxia ([place]) 06/12/2013   secondary to acute on chronic diastolic failure.  . Anemia 2008  . Arthritis   . CAD (coronary artery disease)   . Cataract    bilaterla   . CRI (chronic renal insufficiency) 2008  . DM (diabetes mellitus) ([place]) 1999  . Fracture November 2014   Left wrist-03-12-15 no issues now  . GERD (gastroesophageal reflux disease)   . Hx of CABG 10/24/2007   Dr. Roxan Hockey  .  Hyperlipemia   . Hypertension   . Obesity   . Pacemaker 10/24/2007   Medtronic EnRhythm; implanted for SSS, symptomatic bradycardia, PAF  . Permanent atrial fibrillation ([place])   . S/P CABG x 4   . Transfusion history   . Venous insufficiency    8-12-16left lower lateral outside healed leg ulcer-scabbed over.  . Wrist fracture, left 06/13/2013   03-12-15 no residual issues    Social History Social History  Substance Use Topics  . Smoking status: Former Smoker    Packs/day: 1.00    Years: 4.00    Types: Cigarettes    Quit date: 10/20/1999  . Smokeless tobacco: Never Used  . Alcohol use No    Family History Family History  Problem Relation Age of Onset  . Hodgkin's lymphoma Mother   . Stroke Father   . Diabetes Son   . Colon cancer Neg Hx   . Colon polyps Neg Hx     Past Surgical History:  Procedure Laterality Date  . APPENDECTOMY     APPENDICITIS  . BACK SURGERY    . CARDIAC CATHETERIZATION  08/08/2007   3 vessel CAD (LAD, mid Cfx, mid RCA) (Dr. Marella Chimes)  . CATARACT EXTRACTION, BILATERAL    . COLONOSCOPY WITH ESOPHAGOGASTRODUODENOSCOPY (EGD) N/A 11/25/2012   SLF:Two SMALL AC polyps/Small internal hemorrhoids/The colon IS SLIGHTLY redundant-EGD:Schatzki ring at the gastroesophageal junction/Small hiatal hernia/MILD Non-erosive gastritis  . CORONARY ANGIOPLASTY  11/16/2003   PCI to prox mid RCA (2.75x88mm chromium cobalt vision Guidant stent) (Dr. Marella Chimes)  .  CORONARY ARTERY BYPASS GRAFT  10/24/2007   LIMA to LAD, SVG to DX1, SVG to OM, SVG to distal RCA (Dr. Roxan Hockey)  . ESOPHAGOGASTRODUODENOSCOPY  11/07/2007   [id number] Schatzki's ring.  Otherwise normal esophagus   . EYE SURGERY Left Aug. 2015   torned retina repair  . INSERT / REPLACE / REMOVE PACEMAKER    . JOINT REPLACEMENT Right   . NM MYOCAR PERF WALL MOTION  2011   dipyridamole myoview - moderate ischemai in mid anteroseptal basal inferior inferioer, mid lateral and basal inferolateral region;  abnormal study, EF 41%; high risk scan  . PACEMAKER INSERTION  10/24/2007   MDT EnRhythm implanted by Dr Rollene Fare  . TONSILLECTOMY     age 40  . TOTAL KNEE ARTHROPLASTY Right    2012  . TOTAL KNEE ARTHROPLASTY Left 03/19/2015   Procedure: LEFT TOTAL KNEE ARTHROPLASTY;  Surgeon: Latanya Maudlin, MD;  Location: [facility];  Service: Orthopedics;  Laterality: Left;  . TRANSTHORACIC ECHOCARDIOGRAM  08/2012   EF 55-65%, mod conc hypertrophy; mild AV regurg; calcified MV annulus with mod MR; LA mod diated; RV systolic pressure increased; RA mildly dilated; PA peak pressure 38mmHg  . TUBAL LIGATION      Allergies  Allergen Reactions  . Penicillins Other (See Comments)    Pt. Not sure of reaction.  [name]]     Current Outpatient Prescriptions  Medication Sig Dispense Refill  . aspirin EC 81 MG tablet Take 81 mg by mouth daily.    . cholecalciferol (VITAMIN D) 1000 UNITS tablet Take 3,000 Units by mouth daily.    . citalopram (CELEXA) 20 MG tablet Take 20 mg by mouth every morning.     . docusate sodium (COLACE) 100 MG capsule Take 100 mg by mouth 2 (two) times daily.    . fentaNYL (DURAGESIC - DOSED MCG/HR) 25 MCG/HR patch Place 25 mcg onto the skin every 3 (three) days.    . folic acid (FOLVITE) 1 MG tablet Take 1 mg by mouth every morning.     . furosemide (LASIX) 40 MG tablet Take 1 tablet (40 mg total) by mouth daily.    Marland Kitchen LEVEMIR 100 UNIT/ML injection Inject 10 Units into the skin at bedtime.     Marland Kitchen levothyroxine (SYNTHROID, LEVOTHROID) 125 MCG tablet Take 125 mcg by mouth daily before breakfast.    . Melatonin 3 MG CAPS Take 1 capsule by mouth at bedtime.    . metoprolol tartrate (LOPRESSOR) 25 MG tablet Take 25 mg by mouth 2 (two) times daily.     . Multiple Vitamin (MULTIVITAMIN WITH MINERALS) TABS tablet Take 1 tablet by mouth daily.    . polyethylene glycol (MIRALAX / GLYCOLAX) packet Take 17 g by mouth daily.     . potassium chloride SA (K-DUR,KLOR-CON) 20 MEQ tablet  Take 20 mEq by mouth 2 (two) times daily.     Marland Kitchen warfarin (COUMADIN) 5 MG tablet Take 7.5 mg by mouth every evening. 7.5mg  on Mondays and Fridays. Take 5mg  on all other days    . ciprofloxacin (CIPRO) 750 MG tablet Take 750 mg by mouth 2 (two) times daily.    . iron polysaccharides (NU-IRON) 150 MG capsule Take 150 mg by mouth daily.    . pravastatin (PRAVACHOL) 40 MG tablet Take 40 mg by mouth every evening.     . promethazine (PHENERGAN) 12.5 MG tablet Take 12.5 mg by mouth every 8 (eight) hours as needed for nausea or vomiting.     No  current facility-administered medications for this visit.    Facility-Administered Medications Ordered in Other Visits  Medication Dose Route Frequency Provider Last Rate Last Dose  . bupivacaine liposome (EXPAREL) 1.3 % injection 266 mg  20 mL Infiltration Once [employer], PA-C        ROS: See HPI for pertinent positives and negatives.   Physical Examination  Vitals:   05/12/16 1206  BP: (!) 146/87  Pulse: 76  Resp: 18  Temp: 97.1 F (36.2 C)  TempSrc: Oral  SpO2: 98%  Weight: 225 lb (102.1 kg)  Height: 5\' 7"  (1.702 m)   Body mass index is 35.24 kg/m.  General: A&O x 3, WD, obese female, seated in wheelchair Pulmonary: Sym exp, respirations are non labored, good air movt, CTAB, no rales, rhonchi, or wheezing  Cardiac: RRR, Nl S1, S2, no murmur detected   Vascular:  Vessel  Right  Left   Radial  2+Palpable  2+Palpable   Carotid  without bruit  without bruit   Aorta  Not palpable  N/A   Femoral  Not Palpable due to pannus  Not Palpable due to pannus   Popliteal  Not palpable  Not palpable   PT  Not Palpable  Not Palpable   DP  Not Palpable  Not Palpable    Gastrointestinal: soft, NTND, -G/R, - HSM, - palpable masses, - CVAT B  Musculoskeletal: M/S 4/5 throughout , B edema 2+, no ulcers, + chronic venous stasis dermatitis in both lower legs. Healing surgical related wound anterior and below left knee  (left total knee replacement).  Neurologic: Pain and light touch intact in extremities except decreased in feet, Motor exam as listed above. Hearing impaired.    ASSESSMENT: SUMAYYAH MOXLEY is a 82 y.o. female who presents with chief complaint: healed uclers, PAD, and chronic venous insufficiency.  Her bilateral achilles ulcers and left shin ulcers have healed.  Dr. Gladstone Lighter, ortho, is managing the healing wound just below her left knee. Staff member from the nursing facility with pt and pt indicate the wound is related to the left knee replacement pt had in August 2016. Pt had physical therapy in the nursing facility. She walks with her walker. The nursing facility is treating her venous insufficiency and severe edema in both lower legs with compression dressings which pt states are helping.  Chronic venous insufficiency: by pt and staff member account, pt is elevating her legs more than previously. Legs in dependent position encourage edema in the lower legs; pt already has chronic venous stasis dermatitis secondary to chronic edema. In order to avoid further skin breakdown and difficulty healing the current venous stasis ulcers, pt needs to elevate her legs 3-4x/day for 20 minutes, feet above the level of her heart. Pt and staff members from the nursing facility instructed in this.  After the edema has subsided somewhat, and the below left knee post knee replacement wound has healed, pt should then be properly fitted for knee high compression hose, 20-30 mm Hg graduated compression, donn in the morning, remove at bedtime.    DATA ABI's: no pressures were obtains due to bilateral compression dressings on her lower legs. Waveforms in bilateral dorsalis pedis are biphasic; monophasic in bilateral posterior tibial arteries.  Both TBI's are normal.     PLAN:  Based on the patient's vascular studies and examination, pt will return to clinic in 6 months with ABI's.  Daily seated leg  exercises discussed and demonstrated.   I discussed in depth with the  patient the nature of atherosclerosis, and emphasized the importance of maximal medical management including strict control of blood pressure, blood glucose, and lipid levels, obtaining regular exercise, and continued cessation of smoking.  The patient is aware that without maximal medical management the underlying atherosclerotic disease process will progress, limiting the benefit of any interventions.  The patient was given information about PAD including signs, symptoms, treatment, what symptoms should prompt the patient to seek immediate medical care, and risk reduction measures to take.  Clemon Chambers, RN, MSN, FNP-C [facility] Phone: [phone]  Clinic MD: Donzetta Matters  05/12/16 12:16 PM

## 2016-05-13 NOTE — Patient Instructions (Addendum)
Peripheral Vascular Disease Peripheral vascular disease (PVD) is a disease of the blood vessels that are not part of your heart and brain. A simple term for PVD is poor circulation. In most cases, PVD narrows the blood vessels that carry blood from your heart to the rest of your body. This can result in a decreased supply of blood to your arms, legs, and internal organs, like your stomach or kidneys. However, it most often affects a person's lower legs and feet. There are two types of PVD.  Organic PVD. This is the more common type. It is caused by damage to the structure of blood vessels.  Functional PVD. This is caused by conditions that make blood vessels contract and tighten (spasm). Without treatment, PVD tends to get worse over time. PVD can also lead to acute ischemic limb. This is when an arm or limb suddenly has trouble getting enough blood. This is a medical emergency. CAUSES Each type of PVD has many different causes. The most common cause of PVD is buildup of a fatty material (plaque) inside of your arteries (atherosclerosis). Small amounts of plaque can break off from the walls of the blood vessels and become lodged in a smaller artery. This blocks blood flow and can cause acute ischemic limb. Other common causes of PVD include:  Blood clots that form inside of blood vessels.  Injuries to blood vessels.  Diseases that cause inflammation of blood vessels or cause blood vessel spasms.  Health behaviors and health history that increase your risk of developing PVD. RISK FACTORS  You may have a greater risk of PVD if you:  Have a family history of PVD.  Have certain medical conditions, including:  High cholesterol.  Diabetes.  High blood pressure (hypertension).  Coronary heart disease.  Past problems with blood clots.  Past injury, such as burns or a broken bone. These may have damaged blood vessels in your limbs.  Buerger disease. This is caused by inflamed blood  vessels in your hands and feet.  Some forms of arthritis.  Rare birth defects that affect the arteries in your legs.  Use tobacco.  Do not get enough exercise.  Are obese.  Are age 50 or older. SIGNS AND SYMPTOMS  PVD may cause many different symptoms. Your symptoms depend on what part of your body is not getting enough blood. Some common signs and symptoms include:  Cramps in your lower legs. This may be a symptom of poor leg circulation (claudication).  Pain and weakness in your legs while you are physically active that goes away when you rest (intermittent claudication).  Leg pain when at rest.  Leg numbness, tingling, or weakness.  Coldness in a leg or foot, especially when compared with the other leg.  Skin or hair changes. These can include:  Hair loss.  Shiny skin.  Pale or bluish skin.  Thick toenails.  Inability to get or maintain an erection (erectile dysfunction). People with PVD are more prone to developing ulcers and sores on their toes, feet, or legs. These may take longer than normal to heal. DIAGNOSIS Your health care provider may diagnose PVD from your signs and symptoms. The health care provider will also do a physical exam. You may have tests to find out what is causing your PVD and determine its severity. Tests may include:  Blood pressure recordings from your arms and legs and measurements of the strength of your pulses (pulse volume recordings).  Imaging studies using sound waves to take pictures of   the blood flow through your blood vessels (Doppler ultrasound).  Injecting a dye into your blood vessels before having imaging studies using:  X-rays (angiogram or arteriogram).  Computer-generated X-rays (CT angiogram).  A powerful electromagnetic field and a computer (magnetic resonance angiogram or MRA). TREATMENT Treatment for PVD depends on the cause of your condition and the severity of your symptoms. It also depends on your age. Underlying  causes need to be treated and controlled. These include long-lasting (chronic) conditions, such as diabetes, high cholesterol, and high blood pressure. You may need to first try making lifestyle changes and taking medicines. Surgery may be needed if these do not work. Lifestyle changes may include:  Quitting smoking.  Exercising regularly.  Following a low-fat, low-cholesterol diet. Medicines may include:  Blood thinners to prevent blood clots.  Medicines to improve blood flow.  Medicines to improve your blood cholesterol levels. Surgical procedures may include:  A procedure that uses an inflated balloon to open a blocked artery and improve blood flow (angioplasty).  A procedure to put in a tube (stent) to keep a blocked artery open (stent implant).  Surgery to reroute blood flow around a blocked artery (peripheral bypass surgery).  Surgery to remove dead tissue from an infected wound on the affected limb.  Amputation. This is surgical removal of the affected limb. This may be necessary in cases of acute ischemic limb that are not improved through medical or surgical treatments. HOME CARE INSTRUCTIONS  Take medicines only as directed by your health care provider.  Do not use any tobacco products, including cigarettes, chewing tobacco, or electronic cigarettes. If you need help quitting, ask your health care provider.  Lose weight if you are overweight, and maintain a healthy weight as directed by your health care provider.  Eat a diet that is low in fat and cholesterol. If you need help, ask your health care provider.  Exercise regularly. Ask your health care provider to suggest some good activities for you.  Use compression stockings or other mechanical devices as directed by your health care provider.  Take good care of your feet.  Wear comfortable shoes that fit well.  Check your feet often for any cuts or sores. SEEK MEDICAL CARE IF:  You have cramps in your legs  while walking.  You have leg pain when you are at rest.  You have coldness in a leg or foot.  Your skin changes.  You have erectile dysfunction.  You have cuts or sores on your feet that are not healing. SEEK IMMEDIATE MEDICAL CARE IF:  Your arm or leg turns cold and blue.  Your arms or legs become red, warm, swollen, painful, or numb.  You have chest pain or trouble breathing.  You suddenly have weakness in your face, arm, or leg.  You become very confused or lose the ability to speak.  You suddenly have a very bad headache or lose your vision.   This information is not intended to replace advice given to you by your health care provider. Make sure you discuss any questions you have with your health care provider.   Document Released: 08/24/2004 Document Revised: 08/07/2014 Document Reviewed: 12/25/2013 [employer] Interactive Patient Education 2016 [employer]     Venous Ulcer A venous ulcer is a shallow sore on your lower leg that is caused by poor circulation in your veins. This condition used to be called stasis ulcer.  Veins have valves that help return blood to the heart. If these valves do not  work properly, it can cause blood to flow backward and to back up into the veins near the skin. When that happens, blood can pool in your lower legs. The blood can then leak out of your veins, which can irritate your skin. This may cause a break in your skin that becomes a venous ulcer. Venous ulcer is the most common type of lower leg ulcer. You may have venous ulcers on one leg or on both legs. The area where this condition most commonly develops is around the ankles. A venous ulcer may last for a long time (chronic ulcer) or it may return repeatedly (recurrent ulcer). CAUSES Any condition that causes poor circulation to your legs can lead to a venous ulcer.  RISK FACTORS This condition is more likely to develop in:  People who are 106 years of age or older.  People who are  overweight.  People who are not active.  People who have had a leg ulcer in the past.  People who have clots in their lower leg veins (deep vein thrombosis).  People who have inflammation of their leg veins (phlebitis).  Women who have given birth.  People who smoke. SYMPTOMS  The most common symptom of this condition is an open sore near your ankle. Other symptoms may include:  Swelling.  Thickening of the skin.  Fluid leaking from the ulcer.  Bleeding.  Itching.  Pain and swelling that gets worse when you stand up and feels better when you raise your leg.  Blotchy skin.  Darkening of the skin. DIAGNOSIS  Your health care provider may suspect a venous ulcer based on your medical history and your risk factors. Your health care provider will check the skin on your legs. Other tests may be done to learn more about the ulcer and to determine the best way to treat it. Tests that may be done include:  Measuring the blood pressure in your arms and legs.  Using sound waves (ultrasound) to measure the blood flow in your leg veins. TREATMENT You may need to try several different types of treatment to get your venous ulcer to heal. Healing may take a long time. Treatment may include:  Keeping your leg raised (elevated).  Wearing a type of bandage or stocking to compress the veins of your leg (compression therapy). Venous wounds are not likely to heal or to stay healed without compression.  Taking medicines to improve blood flow.  Taking antibiotic medicines to treat infection.  Cleaning your ulcer and removing any dead tissue from the wound (debridement).  Placing various types of medicated bandage (dressings) or medicated wraps on your ulcer. This helps the ulcer to heal and helps to prevent infection. Surgery is sometimes needed to close the wound using a piece of skin taken from another area of your body (graft). You may need surgery if other treatments are not working or  if your ulcer is very deep. HOME CARE INSTRUCTIONS Wound Care  Follow instructions from your health care provider about:  How to take care of your wound.  When and how you should change your bandage (dressing).  When you should remove your dressing. If your dressing is dry and sticks to your leg when you try to remove it, moisten or wet the dressing with saline solution or water so that the dressing can be removed without harming your skin or wound tissue.  Check your wound every day for signs of infection. Have a caregiver do this for you if you are  not able to do it yourself. Check for:  More redness, swelling, or pain. More fluid or blood.  Pus, warmth, or a bad smell. Medicines   Take over-the-counter and prescription medicines only as told by your health care provider.  If you were prescribed an antibiotic medicine, take it or apply it as told by your health care provider. Do not stop taking or using the antibiotic even if your condition improves. Activity  Do not stand or sit in one position for a long period of time. Rest with your legs raised during the day. If possible, keep your legs above your heart for 30 minutes, 3-4 times a day, or as told by your health care provider.  Do not sit with your legs crossed.   Walk often to increase the blood flow in your legs.Ask your health care provider what level of activity is safe for you.  If you are taking a long ride in a car or plane, take a break to walk around at least once every two hours, or as often as your health care provider recommends. Ask your health care provider if you should take aspirin before long trips.  General Instructions  Wear elastic stockings, compression stockings, or support hose as told by your health care provider. This is very important.  Raise the foot of your bed as told by your health care provider.  Do not smoke.  Keep all follow-up visits as told by your health care provider. This is  important. SEEK MEDICAL CARE IF:   You have a fever.   Your ulcer is getting larger or is not healing.   Your pain gets worse.   You have more redness or swelling around your ulcer.  You have more fluid, blood, or pus coming from your ulcer after it has been cleaned by you or your health care provider.  You have warmth or a bad smell coming from your ulcer.   This information is not intended to replace advice given to you by your health care provider. Make sure you discuss any questions you have with your health care provider.   Document Released: 04/11/2001 Document Revised: 04/07/2015 Document Reviewed: 11/25/2014 [employer] Interactive Patient Education [employer].

## 2016-05-30 ENCOUNTER — Ambulatory Visit (HOSPITAL_COMMUNITY): Payer: Medicare HMO | Attending: Internal Medicine | Admitting: Physical Therapy

## 2016-05-30 DIAGNOSIS — M79662 Pain in left lower leg: Secondary | ICD-10-CM

## 2016-05-30 DIAGNOSIS — I83228 Varicose veins of left lower extremity with both ulcer of other part of lower extremity and inflammation: Secondary | ICD-10-CM | POA: Diagnosis present

## 2016-05-30 DIAGNOSIS — I83218 Varicose veins of right lower extremity with both ulcer of other part of lower extremity and inflammation: Secondary | ICD-10-CM | POA: Insufficient documentation

## 2016-05-30 DIAGNOSIS — L97829 Non-pressure chronic ulcer of other part of left lower leg with unspecified severity: Secondary | ICD-10-CM

## 2016-05-30 DIAGNOSIS — L97819 Non-pressure chronic ulcer of other part of right lower leg with unspecified severity: Secondary | ICD-10-CM

## 2016-05-30 DIAGNOSIS — M79661 Pain in right lower leg: Secondary | ICD-10-CM

## 2016-05-30 DIAGNOSIS — R262 Difficulty in walking, not elsewhere classified: Secondary | ICD-10-CM | POA: Insufficient documentation

## 2016-05-30 DIAGNOSIS — I890 Lymphedema, not elsewhere classified: Secondary | ICD-10-CM | POA: Insufficient documentation

## 2016-05-30 NOTE — Therapy (Signed)
[place], [place], [address] Phone: [phone]   Fax:  [phone]  Wound Care Evaluation  Patient Details  Name: [name] MRN: [id number] Date of Birth: July 28, 1933 Referring Tonnette Zwiebel: Neale Burly  Encounter Date: 05/30/2016      PT End of Session - 05/30/16 1409    Visit Number 1   Number of Visits 16   Date for PT Re-Evaluation 06/29/16   Authorization Type [employer] medicare   Authorization - Visit Number 1   Authorization - Number of Visits 10   PT Start Time [phone]   PT Stop Time 1105   PT Time Calculation (min) 75 min   Activity Tolerance Patient tolerated treatment well   Behavior During Therapy [facility] for tasks assessed/performed      Past Medical History:  Diagnosis Date  . Acute on chronic congestive heart failure with left ventricular diastolic dysfunction ([place]) 06/11/2013  . Acute respiratory failure with hypoxia ([place]) 06/12/2013   secondary to acute on chronic diastolic failure.  . Anemia 2008  . Arthritis   . CAD (coronary artery disease)   . Cataract    bilaterla   . CRI (chronic renal insufficiency) 2008  . DM (diabetes mellitus) ([place]) 1999  . Fracture November 2014   Left wrist-03-12-15 no issues now  . GERD (gastroesophageal reflux disease)   . Hx of CABG 10/24/2007   Dr. Roxan Hockey  . Hyperlipemia   . Hypertension   . Obesity   . Pacemaker 10/24/2007   Medtronic EnRhythm; implanted for SSS, symptomatic bradycardia, PAF  . Permanent atrial fibrillation ([place])   . S/P CABG x 4   . Transfusion history   . Venous insufficiency    8-12-16left lower lateral outside healed leg ulcer-scabbed over.  . Wrist fracture, left 06/13/2013   03-12-15 no residual issues    Past Surgical History:  Procedure Laterality Date  . APPENDECTOMY     APPENDICITIS  . BACK SURGERY    . CARDIAC CATHETERIZATION  08/08/2007   3 vessel CAD (LAD, mid Cfx, mid RCA) (Dr. Marella Chimes)  . CATARACT  EXTRACTION, BILATERAL    . COLONOSCOPY WITH ESOPHAGOGASTRODUODENOSCOPY (EGD) N/A 11/25/2012   SLF:Two SMALL AC polyps/Small internal hemorrhoids/The colon IS SLIGHTLY redundant-EGD:Schatzki ring at the gastroesophageal junction/Small hiatal hernia/MILD Non-erosive gastritis  . CORONARY ANGIOPLASTY  11/16/2003   PCI to prox mid RCA (2.75x61mm chromium cobalt vision Guidant stent) (Dr. Marella Chimes)  . CORONARY ARTERY BYPASS GRAFT  10/24/2007   LIMA to LAD, SVG to DX1, SVG to OM, SVG to distal RCA (Dr. Roxan Hockey)  . ESOPHAGOGASTRODUODENOSCOPY  11/07/2007   [id number] Schatzki's ring.  Otherwise normal esophagus   . EYE SURGERY Left Aug. 2015   torned retina repair  . INSERT / REPLACE / REMOVE PACEMAKER    . JOINT REPLACEMENT Right   . NM MYOCAR PERF WALL MOTION  2011   dipyridamole myoview - moderate ischemai in mid anteroseptal basal inferior inferioer, mid lateral and basal inferolateral region; abnormal study, EF 41%; high risk scan  . PACEMAKER INSERTION  10/24/2007   MDT EnRhythm implanted by Dr Rollene Fare  . TONSILLECTOMY     age 84  . TOTAL KNEE ARTHROPLASTY Right    2012  . TOTAL KNEE ARTHROPLASTY Left 03/19/2015   Procedure: LEFT TOTAL KNEE ARTHROPLASTY;  Surgeon: Latanya Maudlin, MD;  Location: [facility];  Service: Orthopedics;  Laterality: Left;  . TRANSTHORACIC ECHOCARDIOGRAM  08/2012   EF 55-65%, mod conc hypertrophy; mild  AV regurg; calcified MV annulus with mod MR; LA mod diated; RV systolic pressure increased; RA mildly dilated; PA peak pressure 60mmHg  . TUBAL LIGATION      There were no vitals filed for this visit.        [facility] PT Assessment - 05/30/16 0001      Assessment   Medical Diagnosis lymphedema bilaterally with venous stasis wounds.    Referring Nani Ingram Neale Burly   Onset Date/Surgical Date 06/01/15   Next MD Visit unknown   Prior Therapy [name]. Drughn is an 82 yo female who resides at [facility]. The facility has been treating her wounds for  over a year now with multiple ointments, antibiotics      Precautions   Precautions Fall     Restrictions   Weight Bearing Restrictions No     Balance Screen   Has the patient fallen in the past 6 months No   Has the patient had a decrease in activity level because of a fear of falling?  Yes   Is the patient reluctant to leave their home because of a fear of falling?  Yes  lives at a nursing facility.      Prior Function   Level of Independence Needs assistance with ADLs;Needs assistance with homemaking;Needs assistance with gait;Needs assistance with transfers     Cognition   Overall Cognitive Status Within Functional Limits for tasks assessed         Wound Therapy - 05/30/16 1249    Subjective [name] states that her legs are terribly painful    Patient and Family Stated Goals Wounds to finally heal    Date of Onset 06/01/15   Prior Treatments multiple    Pain Assessment 0-10   Pain Score 10-Worst pain ever   Pain Type Chronic pain   Pain Location Leg   Pain Orientation Right;Left   Pain Onset On-going   Patients Stated Pain Goal 5   Pain Intervention(s) Distraction   Evaluation and Treatment Procedures Explained to Patient/Family Yes   Evaluation and Treatment Procedures agreed to   Wound Properties Date First Assessed: 05/30/16 Wound Type: Other (Comment) Location: Leg Wound Description (Comments): Lt anterior superior wound  Present on Admission: Yes   Dressing Type Foam   Dressing Changed Changed   Dressing Status New drainage   Site / Wound Assessment Friable;Painful;Yellow   % Wound base Red or Granulating 0%   % Wound base Yellow 100%   Peri-wound Assessment Edema;Erythema (blanchable)   Wound Length (cm) 8.5 cm   Wound Width (cm) 7 cm   Wound Depth (cm) --  unknown at this time    Drainage Amount Moderate   Treatment Cleansed;Debridement (Selective);Other (Comment)  compression    Wound Properties Date First Assessed: 05/30/16 Time First Assessed:  1000 Wound Type: Other (Comment) Location: Leg Location Orientation: Left Wound Description (Comments): middle anterior wound    % Wound base Red or Granulating 0%   % Wound base Yellow 100%   Wound Length (cm) 3.5 cm   Wound Width (cm) 2.5 cm   Drainage Amount Moderate   Treatment Cleansed;Debridement (Selective)   Wound Properties Date First Assessed: 05/30/16 Time First Assessed: 1000 Location: Leg Location Orientation: Left Wound Description (Comments): inferior anterior wound    Dressing Type Foam   Dressing Changed Changed   % Wound base Red or Granulating 0%   % Wound base Yellow 100%   Wound Length (cm) 4 cm   Wound Width (  cm) 7 cm   Wound Depth (cm) --  unknown at this time    Treatment Cleansed;Debridement (Selective)   Wound Properties Date First Assessed: 05/30/16 Time First Assessed: 1015 Location: Leg Location Orientation: Left Wound Description (Comments): lateral aspect of LE  Present on Admission: Yes   % Wound base Red or Granulating 0%   % Wound base Yellow 100%   Wound Length (cm) 2.5 cm   Wound Width (cm) 2 cm   Wound Properties Date First Assessed: 05/30/16 Time First Assessed: 1000 Wound Type: Other (Comment) Location: Leg Location Orientation: Right;Lateral Wound Description (Comments): Superior of the two lateral wounds  Present on Admission: Yes   Dressing Type Foam   Site / Wound Assessment Yellow   % Wound base Yellow 100%   Peri-wound Assessment Edema   Wound Length (cm) 1 cm   Wound Width (cm) 1 cm   Wound Depth (cm) --  unknown   Treatment Cleansed;Debridement (Selective);Other (Comment)   Wound Properties Date First Assessed: 05/30/16 Time First Assessed: 1015 Wound Type: Other (Comment) Location: Leg Location Orientation: Right;Lateral Wound Description (Comments): More distal of the two lateral wounds  Present on Admission: Yes   Dressing Type Foam   Dressing Changed Changed   Dressing Change Frequency PRN   % Wound base Red or Granulating 0%   %  Wound base Yellow 100%   Wound Length (cm) 3.8 cm   Wound Width (cm) 4 cm   Drainage Amount Moderate   Treatment Cleansed;Debridement (Selective)   Wound Properties Date First Assessed: 05/30/16 Time First Assessed: 1015 Wound Type: Other (Comment) Location: Leg Location Orientation: Right;Medial Present on Admission: Yes   Dressing Type Foam   Dressing Changed Changed   Dressing Change Frequency PRN   % Wound base Red or Granulating 0%   % Wound base Yellow 100%   Wound Length (cm) 4 cm   Wound Width (cm) 6 cm   Wound Depth (cm) --  unknown   Drainage Amount Moderate   Treatment Cleansed;Debridement (Selective)   Wound Properties Date First Assessed: 05/30/16 Time First Assessed: 1015 Location: Leg Location Orientation: Posterior Wound Description (Comments): There are four seperate wounds very close together that spans 10x10 cm  Present on Admission: Yes   Dressing Type Foam   Dressing Changed Changed   % Wound base Red or Granulating 0%   % Wound base Yellow 100%   Peri-wound Assessment Edema   Wound Length (cm) 10 cm   Wound Width (cm) 10 cm   Treatment Cleansed   Incision Properties Date First Assessed: 05/30/16 Time First Assessed: 1000 Present on Admission: Yes Final Assessment Date: 05/30/16 Final Assessment Time: 1358   Dressing Type --   Selective Debridement - Location wound bed   Selective Debridement - Tools Used Forceps   Selective Debridement - Tissue Removed slough    Wound Therapy - Clinical Statement [name] is an 82 yo female who has a complicated history of CHF, Cellulitis of B LE, Chronic kidney disease, PVD, DM, A-fib,venous insufficiency  with chroinc bilateral wounds and edema most likely secondary lymphedema.  She has been treated at the nursing home where she resides for over a year for wound care to her LE but the wounds continue to progress therefore she is being referred to skilled physical therapy for wound care.  Examination shows increased edema  bilaterally with weeping LE and multiple wounds throughout.  [name] will benefit from skilled physical therapy for total decongestive techniques to reduce  her edema and promote an enviroment for wound healling.  [name] will benefit from seeing skilled physical therapy twice a week for the next eight weeks.    Wound Therapy - Functional Problem List difficult to walk, decreased mobility    Factors Delaying/Impairing Wound Healing Altered sensation;Diabetes Mellitus;Infection - systemic/local;Immobility;Multiple medical problems;Polypharmacy;Vascular compromise   Hydrotherapy Plan Debridement;Dressing change;Patient/family education;Pulsatile lavage with suction;Other (comment)  manual techniques and compression bandaging    Wound Therapy - Frequency Other (comment)  2x a week x 8 weeks    Wound Therapy - Current Recommendations PT   Wound Plan Total decongestive techniques debridement, pulse lavage as needed.  Dress with silverhydrofiber and compression dressing   until wounds are not draining then may change dressing.     Dressing  silverhydrofiber to wounds followed by profore lite to B LE with extra cotton used to make a cone shape    Manual Therapy from hip to knee anterior only  due to time constraints.           LYMPHEDEMA/ONCOLOGY QUESTIONNAIRE - 05/30/16 1242      What other symptoms do you have   Are you Having Heaviness or Tightness Yes   Are you having Pain Yes   Are you having pitting edema Yes   Body Site --  B legs   Is it Hard or Difficult finding clothes that fit Yes   Other Symptoms indurating      Lymphedema Stage   Stage STAGE 2 SPONTANEOUSLY IRREVERSIBLE     Lymphedema Assessments   Lymphedema Assessments Lower extremities     Right Lower Extremity Lymphedema   At Midpatella/Popliteal Crease 51.3 cm   30 cm Proximal to Floor at Lateral Plantar Foot 51.5 cm   20 cm Proximal to Floor at Lateral Plantar Foot 35.2 1   10  cm Proximal to Floor at Lateral  Malleoli 25 cm   Circumference of ankle/heel 33 cm.   5 cm Proximal to 1st MTP Joint 24.8 cm   Across MTP Joint 24.2 cm     Left Lower Extremity Lymphedema   At Midpatella/Popliteal Crease 57.5 cm   30 cm Proximal to Floor at Lateral Plantar Foot 46.3 cm   20 cm Proximal to Floor at Lateral Plantar Foot 36 cm   10 cm Proximal to Floor at Lateral Malleoli 24 cm   Circumference of ankle/heel 32.8 cm.   5 cm Proximal to 1st MTP Joint 23.8 cm   Across MTP Joint 22.6 cm                      PT Education - 05/30/16 1427    Education provided Yes   Education Details Given LE exercises to promote lymph circulation.   Instructed to take dressing off if the dressing is painful    Person(s) Educated Patient   Methods Explanation   Comprehension Verbalized understanding          PT Short Term Goals - 05/30/16 1421      PT SHORT TERM GOAL #1   Title Pt wounds to be 50% granulated to decrease risk of infection    Time 3   Period Weeks   Status New     PT SHORT TERM GOAL #2   Title Pt drainage to be minimal to decrease risk of infecton    Time 3   Period Weeks   Status New     PT SHORT TERM GOAL #3   Title Pt edema to  be decreased by 3 cm to decrease risk of cellulitis and to be able to don socks on easier    Time 4   Period Weeks   Status New     PT SHORT TERM GOAL #4   Title Pt pain in both legs to have decreased to 7/10 to allow pt to transfer with moderate assistance    Time 3   Period Weeks   Status New           PT Long Term Goals - 06/11/2016 1423      PT LONG TERM GOAL #1   Title Pt wounds to be 100% granulated to allow nursing staff to take over care of LE    Time 8   Period Weeks   Status New     PT LONG TERM GOAL #2   Title Pt wound size to have decreased by 50% for nursing staff to feel comfortable taking over wound care    Time 8   Period Weeks   Status New     PT LONG TERM GOAL #3   Title Pt to have obtained compression  garments,(most likely [id number])  for both LE and nursing staff to be donning and doffing    Time 8   Period Weeks   Status New     PT LONG TERM GOAL #4   Title Pt pain to be decreased to no greater than a 5/10 to allow pt to ambulate with rolling walker for five to ten minutes with supervision.    Time 8   Period Weeks   Status New              Plan - 06-11-2016 1412    Clinical Impression Statement [name] is an 82 yo female who has had history of leg ulcers for over a year.  She is now being referred to skilled therapy for wound care.  Please see above.     Rehab Potential Fair   PT Frequency 2x / week   PT Duration 8 weeks   PT Treatment/Interventions Therapeutic exercise;Compression bandaging;Manual lymph drainage;Manual techniques;Patient/family education;Other (comment)  debridment both with sharp massage and  or pulse lavage    PT Next Visit Plan Assess how drainage if may need to add calcium alginate; may use pulse lavage if needed, discuss compression bandaging option with son.        Patient will benefit from skilled therapeutic intervention in order to improve the following deficits and impairments:  Decreased activity tolerance, Increased edema, Pain, Other (comment) (non healing wounds )  Visit Diagnosis: Pain in right lower leg  Pain in left lower leg  Lymphedema, not elsewhere classified  Difficulty in walking, not elsewhere classified  Varicose veins of left lower extremity with both ulcer of other part of lower extremity and inflammation (HCC)  Varicose veins of right lower extremity with both ulcer of other part of lower extremity and inflammation ([place])      G-Codes - 06/11/16 1416    Functional Assessment Tool Used clinical judgement: granulation of wounds; time that the wounds have been present    Functional Limitation Other PT primary   Other PT Primary Current Status [id number]) At least 80 percent but less than 100 percent impaired, limited or  restricted   Other PT Primary Goal Status [id number]) At least 40 percent but less than 60 percent impaired, limited or restricted      Problem List Patient Active Problem List   Diagnosis Date Noted  .  Acute on chronic heart failure ([place]) 12/31/2015  . Acute on chronic diastolic CHF (congestive heart failure) ([place]) 12/30/2015  . CKD (chronic kidney disease), stage III 12/30/2015  . Insulin dependent diabetes mellitus ([place]) 12/30/2015  . Essential hypertension 12/30/2015  . Hypothyroidism 12/30/2015  . Open knee wound 10/04/2015  . History of total knee arthroplasty 03/19/2015  . Aftercare following surgery of the circulatory system, [place] 04/24/2014  . Symptomatic bradycardia 04/17/2014  . Peripheral vascular disease, unspecified 01/23/2014  . Venous stasis ulcer ([place]) 10/17/2013  . Atherosclerosis of native arteries of the extremities with ulceration(440.23) 08/29/2013  . Colles' fracture of left radius 08/19/2013  . Wrist fracture, left 06/13/2013  . Fall 06/13/2013  . Acute respiratory failure with hypoxia ([place]) 06/12/2013  . Visual hallucinations 06/12/2013  . DM (diabetes mellitus), type 2 ([place]) 06/11/2013  . Acute on chronic diastolic heart failure ([place]) 06/11/2013  . Morbid obesity ([place]) 06/11/2013  . ARF (acute renal failure) ([place]) 06/07/2013  . Cellulitis and abscess of leg 06/05/2013  . CHF exacerbation ([place]) 11/19/2012  . Chronic lower GI bleeding 11/19/2012  . Type II or unspecified type diabetes mellitus without mention of complication, not stated as uncontrolled 11/19/2012  . Chronic kidney disease (CKD), stage III (moderate) 11/19/2012  . Permanent atrial fibrillation ([place]) 11/15/2012  . Long term (current) use of anticoagulants 11/15/2012  . Anemia    Rayetta Humphrey, PT CLT [phone] 05/30/2016, 2:31 PM  [place] [address] [place], [place], [address] Phone: [phone]   Fax:  [phone]  Name: RITAJ LANTAGNE MRN: [id number] Date of Birth: March 27, 1933

## 2016-06-01 ENCOUNTER — Ambulatory Visit (HOSPITAL_COMMUNITY): Payer: Medicare HMO | Attending: Internal Medicine | Admitting: Physical Therapy

## 2016-06-01 DIAGNOSIS — L97819 Non-pressure chronic ulcer of other part of right lower leg with unspecified severity: Secondary | ICD-10-CM

## 2016-06-01 DIAGNOSIS — I83218 Varicose veins of right lower extremity with both ulcer of other part of lower extremity and inflammation: Secondary | ICD-10-CM | POA: Diagnosis present

## 2016-06-01 DIAGNOSIS — M79662 Pain in left lower leg: Secondary | ICD-10-CM | POA: Insufficient documentation

## 2016-06-01 DIAGNOSIS — I83228 Varicose veins of left lower extremity with both ulcer of other part of lower extremity and inflammation: Secondary | ICD-10-CM | POA: Insufficient documentation

## 2016-06-01 DIAGNOSIS — M79661 Pain in right lower leg: Secondary | ICD-10-CM | POA: Insufficient documentation

## 2016-06-01 DIAGNOSIS — I890 Lymphedema, not elsewhere classified: Secondary | ICD-10-CM | POA: Diagnosis present

## 2016-06-01 DIAGNOSIS — R262 Difficulty in walking, not elsewhere classified: Secondary | ICD-10-CM | POA: Diagnosis present

## 2016-06-01 DIAGNOSIS — L97829 Non-pressure chronic ulcer of other part of left lower leg with unspecified severity: Secondary | ICD-10-CM

## 2016-06-01 NOTE — Therapy (Signed)
[place], [place], [address] Phone: [phone]   Fax:  [phone]  Wound Care Therapy  Patient Details  Name: [name] MRN: [id number] Date of Birth: July 03, 1933 Referring Provider: Neale Burly  Encounter Date: 06/01/2016      PT End of Session - 06/01/16 1637    Visit Number 2   Number of Visits 16   Date for PT Re-Evaluation 06/29/16   Authorization Type [employer] medicare   Authorization - Visit Number 2   Authorization - Number of Visits 10   PT Start Time 1310   PT Stop Time 1420   PT Time Calculation (min) 70 min   Activity Tolerance Patient tolerated treatment well   Behavior During Therapy [facility] for tasks assessed/performed      Past Medical History:  Diagnosis Date  . Acute on chronic congestive heart failure with left ventricular diastolic dysfunction ([place]) 06/11/2013  . Acute respiratory failure with hypoxia ([place]) 06/12/2013   secondary to acute on chronic diastolic failure.  . Anemia 2008  . Arthritis   . CAD (coronary artery disease)   . Cataract    bilaterla   . CRI (chronic renal insufficiency) 2008  . DM (diabetes mellitus) ([place]) 1999  . Fracture November 2014   Left wrist-03-12-15 no issues now  . GERD (gastroesophageal reflux disease)   . Hx of CABG 10/24/2007   Dr. Roxan Hockey  . Hyperlipemia   . Hypertension   . Obesity   . Pacemaker 10/24/2007   Medtronic EnRhythm; implanted for SSS, symptomatic bradycardia, PAF  . Permanent atrial fibrillation ([place])   . S/P CABG x 4   . Transfusion history   . Venous insufficiency    8-12-16left lower lateral outside healed leg ulcer-scabbed over.  . Wrist fracture, left 06/13/2013   03-12-15 no residual issues    Past Surgical History:  Procedure Laterality Date  . APPENDECTOMY     APPENDICITIS  . BACK SURGERY    . CARDIAC CATHETERIZATION  08/08/2007   3 vessel CAD (LAD, mid Cfx, mid RCA) (Dr. Marella Chimes)  . CATARACT  EXTRACTION, BILATERAL    . COLONOSCOPY WITH ESOPHAGOGASTRODUODENOSCOPY (EGD) N/A 11/25/2012   SLF:Two SMALL AC polyps/Small internal hemorrhoids/The colon IS SLIGHTLY redundant-EGD:Schatzki ring at the gastroesophageal junction/Small hiatal hernia/MILD Non-erosive gastritis  . CORONARY ANGIOPLASTY  11/16/2003   PCI to prox mid RCA (2.75x29mm chromium cobalt vision Guidant stent) (Dr. Marella Chimes)  . CORONARY ARTERY BYPASS GRAFT  10/24/2007   LIMA to LAD, SVG to DX1, SVG to OM, SVG to distal RCA (Dr. Roxan Hockey)  . ESOPHAGOGASTRODUODENOSCOPY  11/07/2007   [id number] Schatzki's ring.  Otherwise normal esophagus   . EYE SURGERY Left Aug. 2015   torned retina repair  . INSERT / REPLACE / REMOVE PACEMAKER    . JOINT REPLACEMENT Right   . NM MYOCAR PERF WALL MOTION  2011   dipyridamole myoview - moderate ischemai in mid anteroseptal basal inferior inferioer, mid lateral and basal inferolateral region; abnormal study, EF 41%; high risk scan  . PACEMAKER INSERTION  10/24/2007   MDT EnRhythm implanted by Dr Rollene Fare  . TONSILLECTOMY     age 82  . TOTAL KNEE ARTHROPLASTY Right    2012  . TOTAL KNEE ARTHROPLASTY Left 03/19/2015   Procedure: LEFT TOTAL KNEE ARTHROPLASTY;  Surgeon: Latanya Maudlin, MD;  Location: [facility];  Service: Orthopedics;  Laterality: Left;  . TRANSTHORACIC ECHOCARDIOGRAM  08/2012   EF 55-65%, mod conc hypertrophy; mild  AV regurg; calcified MV annulus with mod MR; LA mod diated; RV systolic pressure increased; RA mildly dilated; PA peak pressure 24mmHg  . TUBAL LIGATION      There were no vitals filed for this visit.                  Wound Therapy - 06/01/16 1430    Subjective Ms. Borjon states that her legs are terribly painful    Patient and Family Stated Goals Wounds to finally heal    Date of Onset 06/01/15   Prior Treatments multiple    Pain Assessment 0-10   Pain Score 7    Pain Type Chronic pain   Pain Location Leg   Pain Orientation Right;Left   Pain  Onset Other (Comment)  with pressure   Patients Stated Pain Goal 2   Pain Intervention(s) Emotional support   Evaluation and Treatment Procedures Explained to Patient/Family Yes   Evaluation and Treatment Procedures agreed to   Wound Properties Date First Assessed: 05/30/16 Wound Type: Other (Comment) Location: Leg Wound Description (Comments): Lt anterior superior wound  Present on Admission: Yes   Dressing Type Compression wrap;Silver hydrofiber   Dressing Changed Changed   Dressing Status New drainage   Dressing Change Frequency PRN   Site / Wound Assessment Friable;Painful;Yellow   % Wound base Red or Granulating 5%   % Wound base Yellow 95%   Peri-wound Assessment Edema;Erythema (blanchable)   Drainage Amount Moderate   Treatment Cleansed;Debridement (Selective);Hydrotherapy (Pulse lavage)   Wound Properties Date First Assessed: 05/30/16 Time First Assessed: 1000 Wound Type: Other (Comment) Location: Leg Location Orientation: Left Wound Description (Comments): middle anterior wound    Dressing Type Compression wrap;Silver hydrofiber   Dressing Changed Changed   Dressing Status Old drainage   Dressing Change Frequency PRN   Site / Wound Assessment Painful;Yellow   % Wound base Red or Granulating 5%   % Wound base Yellow 95%   Peri-wound Assessment Edema   Drainage Amount Moderate   Treatment Cleansed;Debridement (Selective)   Wound Properties Date First Assessed: 05/30/16 Time First Assessed: 1000 Location: Leg Location Orientation: Left Wound Description (Comments): inferior anterior wound    Dressing Type Compression wrap;Silver hydrofiber   Dressing Changed Changed   Dressing Status Old drainage   Dressing Change Frequency PRN   Site / Wound Assessment Painful;Yellow   % Wound base Red or Granulating 5%   % Wound base Yellow 95%   Peri-wound Assessment Edema   Drainage Amount Moderate   Treatment Cleansed;Debridement (Selective)   Wound Properties Date First Assessed:  05/30/16 Time First Assessed: 1015 Location: Leg Location Orientation: Left Wound Description (Comments): lateral aspect of LE  Present on Admission: Yes   Dressing Type Compression wrap;Silver hydrofiber   Dressing Changed Changed   Dressing Status Old drainage   Dressing Change Frequency PRN   Site / Wound Assessment Painful;Yellow   % Wound base Red or Granulating 5%   % Wound base Yellow 95%   Peri-wound Assessment Edema   Drainage Amount Moderate   Treatment Cleansed;Debridement (Selective)   Wound Properties Date First Assessed: 05/30/16 Time First Assessed: 1000 Wound Type: Other (Comment) Location: Leg Location Orientation: Right;Lateral Wound Description (Comments): Superior of the two lateral wounds  Present on Admission: Yes   Dressing Type Compression wrap;Silver dressings   Dressing Change Frequency PRN   Site / Wound Assessment Painful;Yellow   % Wound base Red or Granulating 0%   % Wound base Yellow 100%   Peri-wound  Assessment Edema   Drainage Amount Moderate   Drainage Description Purulent   Treatment Cleansed;Debridement (Selective)   Wound Properties Date First Assessed: 05/30/16 Time First Assessed: 1015 Wound Type: Other (Comment) Location: Leg Location Orientation: Right;Lateral Wound Description (Comments): More distal of the two lateral wounds  Present on Admission: Yes   Dressing Type Compression wrap;Silver hydrofiber   Dressing Changed Changed   Dressing Change Frequency PRN   Site / Wound Assessment Painful;Yellow   % Wound base Red or Granulating 0%   % Wound base Yellow 100%   Drainage Amount Moderate   Treatment Cleansed;Debridement (Selective)   Wound Properties Date First Assessed: 05/30/16 Time First Assessed: 1015 Wound Type: Other (Comment) Location: Leg Location Orientation: Right;Medial Present on Admission: Yes   Dressing Type Compression wrap;Silver hydrofiber   Dressing Changed Changed   Dressing Change Frequency PRN   Site / Wound Assessment  Painful;Yellow   % Wound base Red or Granulating 0%   % Wound base Yellow 100%   Drainage Amount Moderate   Drainage Description Purulent   Treatment Cleansed;Debridement (Selective)   Wound Properties Date First Assessed: 05/30/16 Time First Assessed: 1015 Location: Leg Location Orientation: Posterior Wound Description (Comments): There are four seperate wounds very close together that spans 10x10 cm  Present on Admission: Yes   Dressing Type Compression wrap;Silver hydrofiber   Dressing Changed Changed   Dressing Status Old drainage   Dressing Change Frequency PRN   Site / Wound Assessment Painful;Yellow   % Wound base Red or Granulating 0%   % Wound base Yellow 100%   Peri-wound Assessment Edema   Drainage Amount Moderate   Treatment Cleansed;Debridement (Selective)   Pulsed lavage therapy - wound location Lt anterior wound only    Pulsed Lavage with Suction (psi) 4 psi   Pulsed Lavage with Suction - Normal Saline Used 1000 mL   Pulsed Lavage Tip Tip with splash shield   Selective Debridement - Location wound bed   Selective Debridement - Tools Used Forceps;Scalpel;Scissors   Selective Debridement - Tissue Removed slough    Wound Therapy - Clinical Statement Wounds on Lt LE are slightly improved with no significant change on the Rt LE wounds.  Pt did well with compression dressing.  Will continue current wound care.     Wound Therapy - Functional Problem List difficult to walk, decreased mobility    Factors Delaying/Impairing Wound Healing Altered sensation;Diabetes Mellitus;Infection - systemic/local;Immobility;Multiple medical problems;Polypharmacy;Vascular compromise   Hydrotherapy Plan Debridement;Dressing change;Patient/family education;Pulsatile lavage with suction;Other (comment)  manual techniques and compression bandaging    Wound Therapy - Frequency Other (comment)  2x a week x 8 weeks    Wound Therapy - Current Recommendations PT   Wound Plan Total decongestive  techniques debridement, pulse lavage as needed.  Dress with silverhydrofiber and compression dressing   until wounds are not draining then may change dressing.     Dressing  silverhydrofiber to wounds followed by profore lite to B LE with extra cotton used to make a cone shape    Manual Therapy Supraclavicular, deep and superficial abdominal followed by routing fluid using inguinal/axillary anastomosis and  hip to knee anterior only  due to time constraints.                 PT Education - 06/01/16 1637    Education provided Yes   Education Details reminded pt to have nursing staff take dressings off if they become painful   Person(s) Educated Spouse;Patient   Methods Explanation  Comprehension Verbalized understanding          PT Short Term Goals - 06/01/16 1639      PT SHORT TERM GOAL #1   Title Pt wounds to be 50% granulated to decrease risk of infection    Time 3   Period Weeks   Status On-going     PT SHORT TERM GOAL #2   Title Pt drainage to be minimal to decrease risk of infecton    Time 3   Period Weeks   Status On-going     PT SHORT TERM GOAL #3   Title Pt edema to be decreased by 3 cm to decrease risk of cellulitis and to be able to don socks on easier    Time 4   Period Weeks   Status On-going     PT SHORT TERM GOAL #4   Title Pt pain in both legs to have decreased to 7/10 to allow pt to transfer with moderate assistance    Time 3   Period Weeks   Status On-going           PT Long Term Goals - 06/01/16 1639      PT LONG TERM GOAL #1   Title Pt wounds to be 100% granulated to allow nursing staff to take over care of LE    Time 8   Period Weeks   Status On-going     PT LONG TERM GOAL #2   Title Pt wound size to have decreased by 50% for nursing staff to feel comfortable taking over wound care    Time 8   Period Weeks   Status On-going     PT LONG TERM GOAL #3   Title Pt to have obtained compression garments,(most likely [id number])  for  both LE and nursing staff to be donning and doffing    Time 8   Period Weeks   Status On-going     PT LONG TERM GOAL #4   Title Pt pain to be decreased to no greater than a 5/10 to allow pt to ambulate with rolling walker for five to ten minutes with supervision.    Time 8   Period Weeks   Status On-going               Plan - 06/01/16 1638    Clinical Impression Statement see above    Rehab Potential Fair   PT Frequency 2x / week   PT Duration 8 weeks   PT Treatment/Interventions Therapeutic exercise;Compression bandaging;Manual lymph drainage;Manual techniques;Patient/family education;Other (comment)  debridment both with sharp massage and  or pulse lavage    PT Next Visit Plan Continue use pulse lavage ,       Patient will benefit from skilled therapeutic intervention in order to improve the following deficits and impairments:  Decreased activity tolerance, Increased edema, Pain, Other (comment) (non healing wounds )  Visit Diagnosis: Pain in right lower leg  Pain in left lower leg  Lymphedema, not elsewhere classified  Difficulty in walking, not elsewhere classified  Varicose veins of left lower extremity with both ulcer of other part of lower extremity and inflammation (HCC)  Varicose veins of right lower extremity with both ulcer of other part of lower extremity and inflammation ([place])     Problem List Patient Active Problem List   Diagnosis Date Noted  . Acute on chronic heart failure ([place]) 12/31/2015  . Acute on chronic diastolic CHF (congestive heart failure) ([place]) 12/30/2015  . CKD (chronic kidney  disease), stage III 12/30/2015  . Insulin dependent diabetes mellitus ([place]) 12/30/2015  . Essential hypertension 12/30/2015  . Hypothyroidism 12/30/2015  . Open knee wound 10/04/2015  . History of total knee arthroplasty 03/19/2015  . Aftercare following surgery of the circulatory system, [place] 04/24/2014  . Symptomatic bradycardia 04/17/2014  . Peripheral  vascular disease, unspecified 01/23/2014  . Venous stasis ulcer ([place]) 10/17/2013  . Atherosclerosis of native arteries of the extremities with ulceration(440.23) 08/29/2013  . Colles' fracture of left radius 08/19/2013  . Wrist fracture, left 06/13/2013  . Fall 06/13/2013  . Acute respiratory failure with hypoxia ([place]) 06/12/2013  . Visual hallucinations 06/12/2013  . DM (diabetes mellitus), type 2 ([place]) 06/11/2013  . Acute on chronic diastolic heart failure ([place]) 06/11/2013  . Morbid obesity ([place]) 06/11/2013  . ARF (acute renal failure) ([place]) 06/07/2013  . Cellulitis and abscess of leg 06/05/2013  . CHF exacerbation ([place]) 11/19/2012  . Chronic lower GI bleeding 11/19/2012  . Type II or unspecified type diabetes mellitus without mention of complication, not stated as uncontrolled 11/19/2012  . Chronic kidney disease (CKD), stage III (moderate) 11/19/2012  . Permanent atrial fibrillation ([place]) 11/15/2012  . Long term (current) use of anticoagulants 11/15/2012  . Anemia     Rayetta Humphrey, [place] CLT [phone] 06/01/2016, 4:41 PM  [facility] [address] [place], [place], [address] Phone: [phone]   Fax:  [phone]  Name: TERE BAAR MRN: [id number] Date of Birth: July 09, 1933

## 2016-06-06 ENCOUNTER — Telehealth (HOSPITAL_COMMUNITY): Payer: Self-pay | Admitting: Physical Therapy

## 2016-06-06 NOTE — Telephone Encounter (Signed)
[name] @ [facility] pt has been in the hospital and will not be here tomorrow, will try to get her here on Firday

## 2016-06-07 ENCOUNTER — Ambulatory Visit (HOSPITAL_COMMUNITY): Payer: Medicare HMO | Admitting: Physical Therapy

## 2016-06-09 ENCOUNTER — Ambulatory Visit (HOSPITAL_COMMUNITY): Payer: Medicare HMO | Admitting: Physical Therapy

## 2016-06-12 ENCOUNTER — Ambulatory Visit (HOSPITAL_COMMUNITY): Payer: Medicare HMO | Admitting: Physical Therapy

## 2016-06-12 DIAGNOSIS — L97829 Non-pressure chronic ulcer of other part of left lower leg with unspecified severity: Secondary | ICD-10-CM

## 2016-06-12 DIAGNOSIS — M79662 Pain in left lower leg: Secondary | ICD-10-CM

## 2016-06-12 DIAGNOSIS — L97819 Non-pressure chronic ulcer of other part of right lower leg with unspecified severity: Secondary | ICD-10-CM

## 2016-06-12 DIAGNOSIS — I83228 Varicose veins of left lower extremity with both ulcer of other part of lower extremity and inflammation: Secondary | ICD-10-CM

## 2016-06-12 DIAGNOSIS — M79661 Pain in right lower leg: Secondary | ICD-10-CM

## 2016-06-12 DIAGNOSIS — I83218 Varicose veins of right lower extremity with both ulcer of other part of lower extremity and inflammation: Secondary | ICD-10-CM

## 2016-06-12 DIAGNOSIS — I890 Lymphedema, not elsewhere classified: Secondary | ICD-10-CM

## 2016-06-12 DIAGNOSIS — R262 Difficulty in walking, not elsewhere classified: Secondary | ICD-10-CM

## 2016-06-12 NOTE — Therapy (Signed)
[place], [place], [address] Phone: [phone]   Fax:  [phone]  Wound Care Therapy  Patient Details  Name: [name] MRN: [id number] Date of Birth: [birth year] Referring Provider: Neale [name]  Encounter Date: 06/12/2016      PT End of Session - 06/12/16 1522    Visit Number 3   Number of Visits 16   Date for PT Re-Evaluation 06/29/16   Authorization Type [employer] medicare   Authorization - Visit Number 3   Authorization - Number of Visits 10   PT Start Time 1350   PT Stop Time 1503   PT Time Calculation (min) 73 min   Activity Tolerance Patient tolerated treatment well   Behavior During Therapy [facility] for tasks assessed/performed      Past Medical History:  Diagnosis Date  . Acute on chronic congestive heart failure with left ventricular diastolic dysfunction ([place]) 06/11/2013  . Acute respiratory failure with hypoxia ([place]) 06/12/2013   secondary to acute on chronic diastolic failure.  . Anemia 2008  . Arthritis   . CAD (coronary artery disease)   . Cataract    [place]   . CRI (chronic renal insufficiency) 2008  . DM (diabetes mellitus) ([place]) 1999  . Fracture November 2014   Left wrist-03-12-15 no issues now  . GERD (gastroesophageal reflux disease)   . Hx of CABG 10/24/2007   Dr. Roxan [name]  . Hyperlipemia   . Hypertension   . Obesity   . Pacemaker 10/24/2007   Medtronic EnRhythm; implanted for SSS, symptomatic bradycardia, PAF  . Permanent atrial fibrillation ([place])   . S/P CABG x 4   . Transfusion history   . Venous insufficiency    8-12-16left lower lateral outside healed leg ulcer-scabbed over.  . Wrist fracture, left 06/13/2013   03-12-15 no residual issues    Past Surgical History:  Procedure Laterality Date  . APPENDECTOMY     APPENDICITIS  . BACK SURGERY    . CARDIAC CATHETERIZATION  08/08/2007   3 vessel CAD (LAD, mid Cfx, mid RCA) (Dr. Marella [name])  . CATARACT  EXTRACTION, BILATERAL    . COLONOSCOPY WITH ESOPHAGOGASTRODUODENOSCOPY (EGD) N/A 11/25/2012   SLF:Two SMALL AC polyps/Small internal hemorrhoids/The colon IS SLIGHTLY redundant-EGD:Schatzki ring at the gastroesophageal junction/Small hiatal hernia/MILD Non-erosive gastritis  . CORONARY ANGIOPLASTY  11/16/2003   PCI to prox mid RCA (2.75x45mm chromium cobalt vision Guidant stent) (Dr. Marella [name])  . CORONARY ARTERY BYPASS GRAFT  10/24/2007   LIMA to LAD, SVG to DX1, SVG to OM, SVG to distal RCA (Dr. Roxan [name])  . ESOPHAGOGASTRODUODENOSCOPY  11/07/2007   [id number] Schatzki's ring.  Otherwise normal esophagus   . EYE SURGERY Left Aug. 2015   torned retina repair  . INSERT / REPLACE / REMOVE PACEMAKER    . JOINT REPLACEMENT Right   . NM MYOCAR PERF WALL MOTION  2011   dipyridamole myoview - moderate ischemai in mid anteroseptal basal inferior inferioer, mid lateral and basal inferolateral region; abnormal study, EF 41%; high risk scan  . PACEMAKER INSERTION  10/24/2007   MDT EnRhythm implanted by Dr [name]  . TONSILLECTOMY     age 79  . TOTAL KNEE ARTHROPLASTY Right    2012  . TOTAL KNEE ARTHROPLASTY Left 03/19/2015   Procedure: LEFT TOTAL KNEE ARTHROPLASTY;  Surgeon: [name] Maudlin, MD;  Location: [facility];  Service: Orthopedics;  Laterality: Left;  . TRANSTHORACIC ECHOCARDIOGRAM  08/2012   EF 55-65%, mod conc hypertrophy; mild  AV regurg; calcified MV annulus with mod MR; LA mod diated; RV systolic pressure increased; RA mildly dilated; PA peak pressure 39mmHg  . TUBAL LIGATION      There were no vitals filed for this visit.                  Wound Therapy - 06/12/16 1505    Subjective [name] states that she has been in the hospital since she last came in.  Son reports that they gave her IV antibiotics.  Nurse has came with pt and states that pt has a wound on her bottom but that nursing staff is working with this wound.   Patient and Family Stated Goals Wounds to  finally heal    Date of Onset 06/01/15   Prior Treatments multiple    Pain Assessment 0-10   Pain Score 7   only when wounds are being touched    Pain Type Chronic pain   Pain Location Leg   Pain Orientation Right;Left   Patients Stated Pain Goal 2   Pain Intervention(s) Emotional support   Evaluation and Treatment Procedures Explained to Patient/Family Yes   Evaluation and Treatment Procedures agreed to   Wound Properties Date First Assessed: 05/30/16 Wound Type: Other (Comment) Location: Leg Wound Description (Comments): Lt anterior superior wound  Present on Admission: Yes   Dressing Type Compression wrap   Dressing Changed Changed   Dressing Status New drainage   Dressing Change Frequency PRN   Site / Wound Assessment Friable;Painful;Yellow   % Wound base Red or Granulating 40%   % Wound base Yellow/Fibrinous Exudate 65%   Peri-wound Assessment Edema   Wound Length (cm) 8 cm  was 8.5   Wound Width (cm) 7 cm   Wound Depth (cm) 4 cm   Drainage Amount Moderate   Treatment Cleansed;Debridement (Selective)   Wound Properties Date First Assessed: 05/30/16 Time First Assessed: 1000 Wound Type: Other (Comment) Location: Leg Location Orientation: Left Wound Description (Comments): middle anterior wound    Dressing Type Compression wrap;Impregnated gauze (bismuth)   Dressing Changed Changed   Dressing Status Old drainage   Dressing Change Frequency PRN   Site / Wound Assessment Painful;Red   % Wound base Red or Granulating 100%  was 5   % Wound base Yellow/Fibrinous Exudate 0%  was 95   Peri-wound Assessment Edema   Wound Length (cm) 2.5 cm   Wound Width (cm) 1.5 cm   Drainage Amount Minimal   Treatment Cleansed   Wound Properties Date First Assessed: 05/30/16 Time First Assessed: 1000 Location: Leg Location Orientation: Left Wound Description (Comments): inferior anterior wound    Dressing Type Compression wrap;Impregnated gauze (bismuth)   Dressing Changed Changed   Dressing  Status Old drainage   Dressing Change Frequency PRN   Site / Wound Assessment Painful;Red   % Wound base Red or Granulating 100%  was 5%   % Wound base Yellow/Fibrinous Exudate 0%  was 95%   Peri-wound Assessment Edema   Drainage Amount Minimal  was moderate    Wound Properties Date First Assessed: 05/30/16 Time First Assessed: 1015 Location: Leg Location Orientation: Left Wound Description (Comments): lateral aspect of LE  Present on Admission: Yes   Dressing Type Compression wrap;Impregnated gauze (bismuth)   Dressing Changed Changed   Dressing Status Old drainage   Dressing Change Frequency PRN   Site / Wound Assessment Painful;Yellow   % Wound base Red or Granulating 100%  was 5%   % Wound base  Yellow/Fibrinous Exudate 0%  was 95%   Peri-wound Assessment Edema   Wound Length (cm) 2.3 cm   Wound Width (cm) 1.2 cm   Wound Depth (cm) 0 cm   Drainage Amount Minimal   Treatment Cleansed   Wound Properties Date First Assessed: 05/30/16 Time First Assessed: 1000 Wound Type: Other (Comment) Location: Leg Location Orientation: Right;Lateral Wound Description (Comments): Superior of the two lateral wounds  Present on Admission: Yes   Dressing Type Compression wrap;Silver dressings   Dressing Change Frequency PRN   Site / Wound Assessment Painful;Yellow   % Wound base Red or Granulating 25%  was 0   % Wound base Yellow/Fibrinous Exudate 75%  was 100   Peri-wound Assessment Edema   Wound Length (cm) 1.8 cm   Wound Width (cm) 1 cm   Drainage Amount Minimal   Drainage Description Serosanguineous   Treatment Cleansed;Debridement (Selective)   Wound Properties Date First Assessed: 05/30/16 Time First Assessed: 1015 Wound Type: Other (Comment) Location: Leg Location Orientation: Right;Lateral Wound Description (Comments): More distal of the two lateral wounds  Present on Admission: Yes   Dressing Type Compression wrap;Silver hydrofiber   Dressing Change Frequency PRN   Site / Wound  Assessment Painful;Yellow   % Wound base Red or Granulating 15%  was 0   % Wound base Yellow/Fibrinous Exudate 85%  was 100   Wound Length (cm) 4 cm   Wound Width (cm) 3.5 cm   Drainage Amount Moderate   Treatment Cleansed;Debridement (Selective)   Wound Properties Date First Assessed: 05/30/16 Time First Assessed: 1015 Wound Type: Other (Comment) Location: Leg Location Orientation: Right;Medial Present on Admission: Yes   Dressing Type Compression wrap;Silver hydrofiber   Dressing Change Frequency PRN   Site / Wound Assessment Painful   % Wound base Red or Granulating 60%  was 40   % Wound base Yellow/Fibrinous Exudate 40%   % Wound base Black/Eschar --  was 100   Wound Length (cm) 4 cm   Wound Width (cm) 3.5 cm   Drainage Amount Moderate   Drainage Description Serosanguineous   Treatment Cleansed;Debridement (Selective)   Wound Properties Date First Assessed: 05/30/16 Time First Assessed: 1015 Location: Leg Location Orientation: Posterior Wound Description (Comments): There are four seperate wounds very close together that spans 10x10 cm  Present on Admission: Yes   Dressing Type Compression wrap;Silver hydrofiber   Dressing Status Old drainage   Dressing Change Frequency PRN   Site / Wound Assessment Painful;Yellow   % Wound base Red or Granulating 0%   % Wound base Yellow/Fibrinous Exudate 100%   Peri-wound Assessment Edema   Wound Length (cm) 8.5 cm   Wound Width (cm) 5 cm   Drainage Amount Moderate   Treatment Cleansed   Pulsed lavage therapy - wound location --   Pulsed Lavage with Suction (psi) --   Pulsed Lavage with Suction - Normal Saline Used --   Pulsed Lavage Tip --   Selective Debridement - Location wound bed   Selective Debridement - Tools Used Forceps;Scalpel;Scissors   Selective Debridement - Tissue Removed slough    Wound Therapy - Clinical Statement Wounds are well granulated on Lt LE at this time; Rt LE remains very painful to debride wtih significant  slough.  Pt came in with dressing on right LE from ankle to knee, ( therapist wrote on pt chart that all dressings should be from MTP to knee.).  Rt LE wounds remain full of slough. Thighs continue to have induration.   Pt  will contiue to benefit from skilled therapy.    Wound Therapy - Functional Problem List difficult to walk, decreased mobility    Factors Delaying/Impairing Wound Healing Altered sensation;Diabetes Mellitus;Infection - systemic/local;Immobility;Multiple medical problems;Polypharmacy;Vascular compromise   Hydrotherapy Plan Debridement;Dressing change;Patient/family education;Pulsatile lavage with suction;Other (comment)  manual techniques and compression bandaging    Wound Therapy - Frequency Other (comment)  2x a week x 8 weeks    Wound Therapy - Current Recommendations PT   Wound Plan Total decongestive techniques debridement,  as needed.  Dress with silverhydrofiber and compression dressing   until wounds are not draining then may change dressing.     Dressing  silverhydrofiber used on all wounds on the Lt LE and on anterior wound on left.  All other wounds recieved xeroform both LE recieved profore lite dresing. with extra cotton used to make sure LE has cone shape.    Manual Therapy Supraclavicular, deep and superficial abdominal followed by routing fluid using inguinal/axillary anastomosis and  hip to knee anterior only  due to time constraints.                   PT Short Term Goals - 06/01/16 1639      PT SHORT TERM GOAL #1   Title Pt wounds to be 50% granulated to decrease risk of infection    Time 3   Period Weeks   Status On-going     PT SHORT TERM GOAL #2   Title Pt drainage to be minimal to decrease risk of infecton    Time 3   Period Weeks   Status On-going     PT SHORT TERM GOAL #3   Title Pt edema to be decreased by 3 cm to decrease risk of cellulitis and to be able to don socks on easier    Time 4   Period Weeks   Status On-going     PT  SHORT TERM GOAL #4   Title Pt pain in both legs to have decreased to 7/10 to allow pt to transfer with moderate assistance    Time 3   Period Weeks   Status On-going           PT Long Term Goals - 06/01/16 1639      PT LONG TERM GOAL #1   Title Pt wounds to be 100% granulated to allow nursing staff to take over care of LE    Time 8   Period Weeks   Status On-going     PT LONG TERM GOAL #2   Title Pt wound size to have decreased by 50% for nursing staff to feel comfortable taking over wound care    Time 8   Period Weeks   Status On-going     PT LONG TERM GOAL #3   Title Pt to have obtained compression garments,(most likely [id number])  for both LE and nursing staff to be donning and doffing    Time 8   Period Weeks   Status On-going     PT LONG TERM GOAL #4   Title Pt pain to be decreased to no greater than a 5/10 to allow pt to ambulate with rolling walker for five to ten minutes with supervision.    Time 8   Period Weeks   Status On-going               Plan - 06/12/16 1522    Clinical Impression Statement see above    Rehab Potential Fair  PT Frequency 2x / week   PT Duration 8 weeks   PT Treatment/Interventions Therapeutic exercise;Compression bandaging;Manual lymph drainage;Manual techniques;Patient/family education;Other (comment)  debridment both with sharp massage and  or pulse lavage    PT Next Visit Plan continue debridement, manual decongestive techniques and compression dressing       Patient will benefit from skilled therapeutic intervention in order to improve the following deficits and impairments:  Decreased activity tolerance, Increased edema, Pain, Other (comment) (non healing wounds )  Visit Diagnosis: Pain in right lower leg  Pain in left lower leg  Lymphedema, not elsewhere classified  Difficulty in walking, not elsewhere classified  Varicose veins of left lower extremity with both ulcer of other part of lower extremity and  inflammation (HCC)  Varicose veins of right lower extremity with both ulcer of other part of lower extremity and inflammation ([place])     Problem List Patient Active Problem List   Diagnosis Date Noted  . Acute on chronic heart failure ([place]) 12/31/2015  . Acute on chronic diastolic CHF (congestive heart failure) ([place]) 12/30/2015  . CKD (chronic kidney disease), stage III 12/30/2015  . Insulin dependent diabetes mellitus ([place]) 12/30/2015  . Essential hypertension 12/30/2015  . Hypothyroidism 12/30/2015  . Open knee wound 10/04/2015  . History of total knee arthroplasty 03/19/2015  . Aftercare following surgery of the circulatory system, [place] 04/24/2014  . Symptomatic bradycardia 04/17/2014  . Peripheral vascular disease, unspecified 01/23/2014  . Venous stasis ulcer ([place]) 10/17/2013  . Atherosclerosis of native arteries of the extremities with ulceration(440.23) 08/29/2013  . Colles' fracture of left radius 08/19/2013  . Wrist fracture, left 06/13/2013  . Fall 06/13/2013  . Acute respiratory failure with hypoxia ([place]) 06/12/2013  . Visual hallucinations 06/12/2013  . DM (diabetes mellitus), type 2 ([place]) 06/11/2013  . Acute on chronic diastolic heart failure ([place]) 06/11/2013  . Morbid obesity ([place]) 06/11/2013  . ARF (acute renal failure) ([place]) 06/07/2013  . Cellulitis and abscess of leg 06/05/2013  . CHF exacerbation ([place]) 11/19/2012  . Chronic lower GI bleeding 11/19/2012  . Type II or unspecified type diabetes mellitus without mention of complication, not stated as uncontrolled 11/19/2012  . Chronic kidney disease (CKD), stage III (moderate) 11/19/2012  . Permanent atrial fibrillation ([employer]) 11/15/2012  . Long term (current) use of anticoagulants 11/15/2012  . Anemia   Rayetta Humphrey, PT CLT [phone] 06/12/2016, 3:24 PM  [facility] [address] [place], [place], [address] Phone: [phone]   Fax:  [phone]  Name:  [name] MRN: [id number] Date of Birth: 1932/08/21

## 2016-06-13 NOTE — Addendum Note (Signed)
Addended by: Mena Goes on: 06/13/2016 03:12 PM   Modules accepted: Orders

## 2016-06-15 ENCOUNTER — Ambulatory Visit (HOSPITAL_COMMUNITY): Payer: Medicare HMO | Admitting: Physical Therapy

## 2016-06-15 DIAGNOSIS — M79661 Pain in right lower leg: Secondary | ICD-10-CM | POA: Diagnosis not present

## 2016-06-15 DIAGNOSIS — L97829 Non-pressure chronic ulcer of other part of left lower leg with unspecified severity: Secondary | ICD-10-CM

## 2016-06-15 DIAGNOSIS — I83228 Varicose veins of left lower extremity with both ulcer of other part of lower extremity and inflammation: Secondary | ICD-10-CM

## 2016-06-15 DIAGNOSIS — M79662 Pain in left lower leg: Secondary | ICD-10-CM

## 2016-06-15 DIAGNOSIS — I83218 Varicose veins of right lower extremity with both ulcer of other part of lower extremity and inflammation: Secondary | ICD-10-CM

## 2016-06-15 DIAGNOSIS — L97819 Non-pressure chronic ulcer of other part of right lower leg with unspecified severity: Secondary | ICD-10-CM

## 2016-06-15 DIAGNOSIS — I890 Lymphedema, not elsewhere classified: Secondary | ICD-10-CM

## 2016-06-15 DIAGNOSIS — R262 Difficulty in walking, not elsewhere classified: Secondary | ICD-10-CM

## 2016-06-15 NOTE — Therapy (Signed)
[place], [place], [address] Phone: [phone]   Fax:  [phone]  Wound Care Therapy  Patient Details  Name: [name] MRN: [id number] Date of Birth: Jan 01, 1933 Referring Provider: Neale Burly  Encounter Date: 06/15/2016      PT End of Session - 06/15/16 1638    Visit Number 4   Number of Visits 16   Date for PT Re-Evaluation 06/29/16   Authorization Type [employer] medicare   Authorization - Visit Number 4   Authorization - Number of Visits 10   PT Start Time [id number]   PT Stop Time 1555   PT Time Calculation (min) 83 min   Activity Tolerance Patient tolerated treatment well   Behavior During Therapy [facility] for tasks assessed/performed      Past Medical History:  Diagnosis Date  . Acute on chronic congestive heart failure with left ventricular diastolic dysfunction ([place]) 06/11/2013  . Acute respiratory failure with hypoxia ([place]) 06/12/2013   secondary to acute on chronic diastolic failure.  . Anemia 2008  . Arthritis   . CAD (coronary artery disease)   . Cataract    [place]   . CRI (chronic renal insufficiency) 2008  . DM (diabetes mellitus) ([place]) 1999  . Fracture November 2014   Left wrist-03-12-15 no issues now  . GERD (gastroesophageal reflux disease)   . Hx of CABG 10/24/2007   Dr. Roxan Hockey  . Hyperlipemia   . Hypertension   . Obesity   . Pacemaker 10/24/2007   Medtronic EnRhythm; implanted for SSS, symptomatic bradycardia, PAF  . Permanent atrial fibrillation ([place])   . S/P CABG x 4   . Transfusion history   . Venous insufficiency    8-12-16left lower lateral outside healed leg ulcer-scabbed over.  . Wrist fracture, left 06/13/2013   03-12-15 no residual issues    Past Surgical History:  Procedure Laterality Date  . APPENDECTOMY     APPENDICITIS  . BACK SURGERY    . CARDIAC CATHETERIZATION  08/08/2007   3 vessel CAD (LAD, mid Cfx, mid RCA) (Dr. Marella Chimes)  . CATARACT  EXTRACTION, BILATERAL    . COLONOSCOPY WITH ESOPHAGOGASTRODUODENOSCOPY (EGD) N/A 11/25/2012   SLF:Two SMALL AC polyps/Small internal hemorrhoids/The colon IS SLIGHTLY redundant-EGD:Schatzki ring at the gastroesophageal junction/Small hiatal hernia/MILD Non-erosive gastritis  . CORONARY ANGIOPLASTY  11/16/2003   PCI to prox mid RCA (2.75x55mm chromium cobalt vision Guidant stent) (Dr. Marella Chimes)  . CORONARY ARTERY BYPASS GRAFT  10/24/2007   LIMA to LAD, SVG to DX1, SVG to OM, SVG to distal RCA (Dr. Roxan Hockey)  . ESOPHAGOGASTRODUODENOSCOPY  11/07/2007   [id number] Schatzki's ring.  Otherwise normal esophagus   . EYE SURGERY Left Aug. 2015   torned retina repair  . INSERT / REPLACE / REMOVE PACEMAKER    . JOINT REPLACEMENT Right   . NM MYOCAR PERF WALL MOTION  2011   dipyridamole myoview - moderate ischemai in mid anteroseptal basal inferior inferioer, mid lateral and basal inferolateral region; abnormal study, EF 41%; high risk scan  . PACEMAKER INSERTION  10/24/2007   MDT EnRhythm implanted by Dr Rollene Fare  . TONSILLECTOMY     age 15  . TOTAL KNEE ARTHROPLASTY Right    2012  . TOTAL KNEE ARTHROPLASTY Left 03/19/2015   Procedure: LEFT TOTAL KNEE ARTHROPLASTY;  Surgeon: Latanya Maudlin, MD;  Location: [facility];  Service: Orthopedics;  Laterality: Left;  . TRANSTHORACIC ECHOCARDIOGRAM  08/2012   EF 55-65%, mod conc hypertrophy; mild  AV regurg; calcified MV annulus with mod MR; LA mod diated; RV systolic pressure increased; RA mildly dilated; PA peak pressure 5mmHg  . TUBAL LIGATION      There were no vitals filed for this visit.                  Wound Therapy - 06/15/16 1610    Subjective Pt accompanied by 2 nurses who staate she has increased confusion and dilusions today.  Pt without verbalized pain.   Patient and Family Stated Goals Wounds to finally heal    Date of Onset 06/01/15   Prior Treatments multiple    Pain Assessment Faces   Pain Score 4    Evaluation and  Treatment Procedures Explained to Patient/Family Yes   Evaluation and Treatment Procedures Patient unable to consent due to mental status   Wound Properties Date First Assessed: 05/30/16 Wound Type: Other (Comment) Location: Leg Wound Description (Comments): Lt anterior superior wound  Present on Admission: Yes   Dressing Type Compression wrap   Dressing Changed Changed   Dressing Status Old drainage   Dressing Change Frequency PRN   Site / Wound Assessment Friable;Painful;Yellow   % Wound base Red or Granulating 40%   % Wound base Yellow/Fibrinous Exudate 60%   Peri-wound Assessment Edema   Margins Unattached edges (unapproximated)   Drainage Amount Moderate   Drainage Description Serosanguineous   Treatment Cleansed;Debridement (Selective)   Wound Properties Date First Assessed: 05/30/16 Time First Assessed: 1000 Wound Type: Other (Comment) Location: Leg Location Orientation: Left Wound Description (Comments): middle anterior wound    Dressing Type Compression wrap;Impregnated gauze (bismuth)   Dressing Changed Changed   Dressing Status Old drainage   Dressing Change Frequency PRN   Site / Wound Assessment Painful;Red   % Wound base Red or Granulating 100%  was 5   % Wound base Yellow/Fibrinous Exudate 0%  was 95   Peri-wound Assessment Edema   Drainage Amount Minimal   Drainage Description Serosanguineous   Treatment Cleansed;Debridement (Selective)   Wound Properties Date First Assessed: 05/30/16 Time First Assessed: 1000 Location: Leg Location Orientation: Left Wound Description (Comments): inferior anterior wound    Dressing Type Compression wrap;Impregnated gauze (bismuth)   Dressing Changed Changed   Dressing Status Old drainage   Dressing Change Frequency PRN   Site / Wound Assessment Painful;Red   % Wound base Red or Granulating 100%  was 5%   % Wound base Yellow/Fibrinous Exudate 0%  was 95%   Peri-wound Assessment Edema   Drainage Amount Minimal  was moderate     Drainage Description Serosanguineous   Treatment Cleansed;Debridement (Selective)   Wound Properties Date First Assessed: 05/30/16 Time First Assessed: 1015 Location: Leg Location Orientation: Left Wound Description (Comments): lateral aspect of LE  Present on Admission: Yes   Dressing Type Compression wrap;Impregnated gauze (bismuth)   Dressing Changed Changed   Dressing Status Old drainage   Dressing Change Frequency PRN   Site / Wound Assessment Painful;Yellow   % Wound base Red or Granulating 100%  was 5%   % Wound base Yellow/Fibrinous Exudate 0%  was 95%   Peri-wound Assessment Edema   Drainage Amount Minimal   Drainage Description Serosanguineous   Treatment Cleansed;Debridement (Selective)   Wound Properties Date First Assessed: 05/30/16 Time First Assessed: 1000 Wound Type: Other (Comment) Location: Leg Location Orientation: Right;Lateral Wound Description (Comments): Superior of the two lateral wounds  Present on Admission: Yes   Dressing Type Compression wrap;Silver dressings   Dressing Change  Frequency PRN   Site / Wound Assessment Painful;Yellow   % Wound base Red or Granulating 60%  was 0   % Wound base Yellow/Fibrinous Exudate 40%  was 100   Peri-wound Assessment Edema   Drainage Amount Minimal   Drainage Description Serosanguineous   Treatment Cleansed;Debridement (Selective)   Wound Properties Date First Assessed: 05/30/16 Time First Assessed: 1015 Wound Type: Other (Comment) Location: Leg Location Orientation: Right;Lateral Wound Description (Comments): More distal of the two lateral wounds  Present on Admission: Yes   Dressing Type Compression wrap;Silver hydrofiber   Dressing Change Frequency PRN   Site / Wound Assessment Painful;Yellow   % Wound base Red or Granulating 25%  was 0   % Wound base Yellow/Fibrinous Exudate 75%  was 100   Drainage Amount Moderate   Drainage Description Serosanguineous   Treatment Cleansed;Debridement (Selective)   Wound  Properties Date First Assessed: 05/30/16 Time First Assessed: 1015 Wound Type: Other (Comment) Location: Leg Location Orientation: Right;Medial Present on Admission: Yes   Dressing Type Compression wrap;Silver hydrofiber   Dressing Changed Changed   Dressing Status Old drainage   Dressing Change Frequency PRN   Site / Wound Assessment Painful   % Wound base Red or Granulating 60%  was 40   % Wound base Yellow/Fibrinous Exudate 40%   % Wound base Black/Eschar --  was 100   Drainage Amount Moderate   Drainage Description Serosanguineous   Treatment Cleansed;Debridement (Selective)   Wound Properties Date First Assessed: 05/30/16 Time First Assessed: 1015 Location: Leg Location Orientation: Posterior Wound Description (Comments): There are four seperate wounds very close together that spans 10x10 cm  Present on Admission: Yes   Dressing Type Compression wrap;Silver hydrofiber   Dressing Changed Changed   Dressing Status Old drainage   Dressing Change Frequency PRN   Site / Wound Assessment Painful;Yellow   % Wound base Red or Granulating 15%   % Wound base Yellow/Fibrinous Exudate 85%   Peri-wound Assessment Edema   Drainage Amount Moderate   Drainage Description Serosanguineous   Treatment Cleansed;Debridement (Selective)   Selective Debridement - Location wound bed   Selective Debridement - Tools Used Forceps;Scalpel;Scissors   Selective Debridement - Tissue Removed slough    Wound Therapy - Clinical Statement PT with increased confusion, seeing people, petting dogs and calling out for [place].  Pt's son was not present this session and nurses had to leave upon dropping her off.  Bandages remained intact with some sticking of dressings to wound beds.  Extra saline used to loosen dressings.  Pt very sensitive to skin around wounds allowing only minimal massage and careful cleansing and moisturizing.  Pt did allow more debridement today, however.  Able to remove alot of slough from several  wounds with resulting improved granulation.  Only requiring silver hydro for Lt anterior and Rt posterior wounds as still draining the most.     Wound Therapy - Functional Problem List difficult to walk, decreased mobility    Factors Delaying/Impairing Wound Healing Altered sensation;Diabetes Mellitus;Infection - systemic/local;Immobility;Multiple medical problems;Polypharmacy;Vascular compromise   Hydrotherapy Plan Debridement;Dressing change;Patient/family education;Pulsatile lavage with suction;Other (comment)  manual techniques and compression bandaging    Wound Therapy - Frequency Other (comment)  2x a week x 8 weeks    Wound Therapy - Current Recommendations PT   Wound Plan Total decongestive techniques debridement,  as needed.  Dress with silverhydrofiber and compression dressing   until wounds are not draining then may change dressing.     Dressing  silverhydrofiber on  Lt anterior superior and Rt posterior wounds.  All other wounds recieved xeroform both LE recieved profore lite dresing. with extra cotton used to make sure LE has cone shape.    Manual Therapy Supraclavicular, deep and superficial abdominal followed by routing fluid using inguinal/axillary anastomosis and  hip to knee anterior only  due to time constraints.  Little massage to distal LE's as very tender to the touch.                    PT Short Term Goals - 06/01/16 1639      PT SHORT TERM GOAL #1   Title Pt wounds to be 50% granulated to decrease risk of infection    Time 3   Period Weeks   Status On-going     PT SHORT TERM GOAL #2   Title Pt drainage to be minimal to decrease risk of infecton    Time 3   Period Weeks   Status On-going     PT SHORT TERM GOAL #3   Title Pt edema to be decreased by 3 cm to decrease risk of cellulitis and to be able to don socks on easier    Time 4   Period Weeks   Status On-going     PT SHORT TERM GOAL #4   Title Pt pain in both legs to have decreased to 7/10 to  allow pt to transfer with moderate assistance    Time 3   Period Weeks   Status On-going           PT Long Term Goals - 06/01/16 1639      PT LONG TERM GOAL #1   Title Pt wounds to be 100% granulated to allow nursing staff to take over care of LE    Time 8   Period Weeks   Status On-going     PT LONG TERM GOAL #2   Title Pt wound size to have decreased by 50% for nursing staff to feel comfortable taking over wound care    Time 8   Period Weeks   Status On-going     PT LONG TERM GOAL #3   Title Pt to have obtained compression garments,(most likely [id number])  for both LE and nursing staff to be donning and doffing    Time 8   Period Weeks   Status On-going     PT LONG TERM GOAL #4   Title Pt pain to be decreased to no greater than a 5/10 to allow pt to ambulate with rolling walker for five to ten minutes with supervision.    Time 8   Period Weeks   Status On-going             Patient will benefit from skilled therapeutic intervention in order to improve the following deficits and impairments:     Visit Diagnosis: Pain in right lower leg  Pain in left lower leg  Lymphedema, not elsewhere classified  Difficulty in walking, not elsewhere classified  Varicose veins of left lower extremity with both ulcer of other part of lower extremity and inflammation (HCC)  Varicose veins of right lower extremity with both ulcer of other part of lower extremity and inflammation ([place])     Problem List Patient Active Problem List   Diagnosis Date Noted  . Acute on chronic heart failure ([place]) 12/31/2015  . Acute on chronic diastolic CHF (congestive heart failure) ([place]) 12/30/2015  . CKD (chronic kidney disease), stage III 12/30/2015  .  Insulin dependent diabetes mellitus ([place]) 12/30/2015  . Essential hypertension 12/30/2015  . Hypothyroidism 12/30/2015  . Open knee wound 10/04/2015  . History of total knee arthroplasty 03/19/2015  . Aftercare following surgery of the  circulatory system, [place] 04/24/2014  . Symptomatic bradycardia 04/17/2014  . Peripheral vascular disease, unspecified 01/23/2014  . Venous stasis ulcer ([place]) 10/17/2013  . Atherosclerosis of native arteries of the extremities with ulceration(440.23) 08/29/2013  . Colles' fracture of left radius 08/19/2013  . Wrist fracture, left 06/13/2013  . Fall 06/13/2013  . Acute respiratory failure with hypoxia ([place]) 06/12/2013  . Visual hallucinations 06/12/2013  . DM (diabetes mellitus), type 2 ([place]) 06/11/2013  . Acute on chronic diastolic heart failure ([place]) 06/11/2013  . Morbid obesity ([place]) 06/11/2013  . ARF (acute renal failure) ([place]) 06/07/2013  . Cellulitis and abscess of leg 06/05/2013  . CHF exacerbation ([place]) 11/19/2012  . Chronic lower GI bleeding 11/19/2012  . Type II or unspecified type diabetes mellitus without mention of complication, not stated as uncontrolled 11/19/2012  . Chronic kidney disease (CKD), stage III (moderate) 11/19/2012  . Permanent atrial fibrillation ([place]) 11/15/2012  . Long term (current) use of anticoagulants 11/15/2012  . Anemia     Teena Irani, PTA/CLT [phone]  06/15/2016, 4:56 PM  [place] [address] [place], [place], [address] Phone: [phone]   Fax:  [phone]  Name: [name] MRN: [id number] Date of Birth: [birth year]

## 2016-06-19 ENCOUNTER — Ambulatory Visit (HOSPITAL_COMMUNITY): Payer: Medicare HMO | Admitting: Physical Therapy

## 2016-06-19 DIAGNOSIS — L97829 Non-pressure chronic ulcer of other part of left lower leg with unspecified severity: Secondary | ICD-10-CM

## 2016-06-19 DIAGNOSIS — M79661 Pain in right lower leg: Secondary | ICD-10-CM | POA: Diagnosis not present

## 2016-06-19 DIAGNOSIS — I890 Lymphedema, not elsewhere classified: Secondary | ICD-10-CM

## 2016-06-19 DIAGNOSIS — I83218 Varicose veins of right lower extremity with both ulcer of other part of lower extremity and inflammation: Secondary | ICD-10-CM

## 2016-06-19 DIAGNOSIS — I83228 Varicose veins of left lower extremity with both ulcer of other part of lower extremity and inflammation: Secondary | ICD-10-CM

## 2016-06-19 DIAGNOSIS — L97819 Non-pressure chronic ulcer of other part of right lower leg with unspecified severity: Secondary | ICD-10-CM

## 2016-06-19 DIAGNOSIS — M79662 Pain in left lower leg: Secondary | ICD-10-CM

## 2016-06-19 NOTE — Therapy (Signed)
[place], [place], [address] Phone: [phone]   Fax:  [phone]  Wound Care Therapy  Patient Details  Name: [name] MRN: [id number] Date of Birth: August 18, 1932 Referring Provider: Neale Burly  Encounter Date: 06/19/2016      PT End of Session - 06/19/16 1433    Visit Number 5   Number of Visits 16   Date for PT Re-Evaluation 06/29/16   Authorization Type [employer] medicare   Authorization - Visit Number 5   Authorization - Number of Visits 10   PT Start Time [id number]   PT Stop Time 1421   PT Time Calculation (min) 77 min   Activity Tolerance Patient tolerated treatment well   Behavior During Therapy [facility] for tasks assessed/performed      Past Medical History:  Diagnosis Date  . Acute on chronic congestive heart failure with left ventricular diastolic dysfunction ([place]) 06/11/2013  . Acute respiratory failure with hypoxia ([place]) 06/12/2013   secondary to acute on chronic diastolic failure.  . Anemia 2008  . Arthritis   . CAD (coronary artery disease)   . Cataract    bil[place]la   . CRI (chronic renal insufficiency) 2008  . DM (diabetes mellitus) ([place]) 1999  . Fracture November 2014   Left wrist-03-12-15 no issues now  . GERD (gastroesophageal reflux disease)   . Hx of CABG 10/24/2007   Dr. Roxan Hockey  . Hyperlipemia   . Hypertension   . Obesity   . Pacemaker 10/24/2007   Medtronic EnRhythm; implanted for SSS, symptomatic bradycardia, PAF  . Permanent atrial fibrillation ([place])   . S/P CABG x 4   . Transfusion history   . Venous insufficiency    8-12-16left lower lateral outside healed leg ulcer-scabbed over.  . Wrist fracture, left 06/13/2013   03-12-15 no residual issues    Past Surgical History:  Procedure Laterality Date  . APPENDECTOMY     APPENDICITIS  . BACK SURGERY    . CARDIAC CATHETERIZATION  08/08/2007   3 vessel CAD (LAD, mid Cfx, mid RCA) (Dr. Marella Chimes)  . CATARACT  EXTRACTION, BILATERAL    . COLONOSCOPY WITH ESOPHAGOGASTRODUODENOSCOPY (EGD) N/A 11/25/2012   SLF:Two SMALL AC polyps/Small internal hemorrhoids/The colon IS SLIGHTLY redundant-EGD:Schatzki ring at the gastroesophageal junction/Small hiatal hernia/MILD Non-erosive gastritis  . CORONARY ANGIOPLASTY  11/16/2003   PCI to prox mid RCA (2.75x65mm chromium cobalt vision Guidant stent) (Dr. Marella Chimes)  . CORONARY ARTERY BYPASS GRAFT  10/24/2007   LIMA to LAD, SVG to DX1, SVG to OM, SVG to distal RCA (Dr. Roxan Hockey)  . ESOPHAGOGASTRODUODENOSCOPY  11/07/2007   [id number] Schatzki's ring.  Otherwise normal esophagus   . EYE SURGERY Left Aug. 2015   torned retina repair  . INSERT / REPLACE / REMOVE PACEMAKER    . JOINT REPLACEMENT Right   . NM MYOCAR PERF WALL MOTION  2011   dipyridamole myoview - moderate ischemai in mid anteroseptal basal inferior inferioer, mid lateral and basal inferolateral region; abnormal study, EF 41%; high risk scan  . PACEMAKER INSERTION  10/24/2007   MDT EnRhythm implanted by Dr Rollene Fare  . TONSILLECTOMY     age 54  . TOTAL KNEE ARTHROPLASTY Right    2012  . TOTAL KNEE ARTHROPLASTY Left 03/19/2015   Procedure: LEFT TOTAL KNEE ARTHROPLASTY;  Surgeon: Latanya Maudlin, MD;  Location: [facility];  Service: Orthopedics;  Laterality: Left;  . TRANSTHORACIC ECHOCARDIOGRAM  08/2012   EF 55-65%, mod conc hypertrophy; mild  AV regurg; calcified MV annulus with mod MR; LA mod diated; RV systolic pressure increased; RA mildly dilated; PA peak pressure 9mmHg  . TUBAL LIGATION      There were no vitals filed for this visit.                  Wound Therapy - 06/19/16 1424    Subjective Pt states she has no pain except with pressure or debridement.   Patient and Family Stated Goals Wounds to finally heal    Date of Onset 06/01/15   Prior Treatments multiple    Pain Assessment Faces   Pain Score 7   With debridement only   Pain Type Chronic pain   Pain Location Leg    Pain Orientation Right;Left   Patients Stated Pain Goal 2   Pain Intervention(s) Distraction   Evaluation and Treatment Procedures Explained to Patient/Family Yes   Evaluation and Treatment Procedures Patient unable to consent due to mental status   Wound Properties Date First Assessed: 05/30/16 Wound Type: Other (Comment) Location: Leg Wound Description (Comments): Lt anterior superior wound  Present on Admission: Yes   Dressing Type Compression wrap;Silver hydrofiber   Dressing Changed Changed   Dressing Status Old drainage   Dressing Change Frequency PRN   Site / Wound Assessment Friable;Painful;Yellow   % Wound base Red or Granulating 55%   % Wound base Yellow/Fibrinous Exudate 45%   Peri-wound Assessment Edema   Margins Unattached edges (unapproximated)   Drainage Amount Moderate   Drainage Description Serosanguineous   Treatment Cleansed;Debridement (Selective)   Wound Properties Date First Assessed: 05/30/16 Time First Assessed: 1000 Wound Type: Other (Comment) Location: Leg Location Orientation: Left Wound Description (Comments): middle anterior wound    Dressing Type Compression wrap;Impregnated gauze (bismuth)   Dressing Changed Changed   Dressing Status Old drainage   Dressing Change Frequency PRN   Site / Wound Assessment Painful;Red   % Wound base Red or Granulating 100%  was 5   % Wound base Yellow/Fibrinous Exudate 0%  was 95   Peri-wound Assessment Edema   Drainage Amount Scant   Drainage Description Serosanguineous   Treatment Cleansed   Wound Properties Date First Assessed: 05/30/16 Time First Assessed: 1000 Location: Leg Location Orientation: Left Wound Description (Comments): inferior anterior wound    Dressing Type Compression wrap;Impregnated gauze (bismuth)   Dressing Changed Changed   Dressing Status Old drainage   Dressing Change Frequency PRN   Site / Wound Assessment Painful;Red   % Wound base Red or Granulating 100%  was 5%   % Wound base  Yellow/Fibrinous Exudate 0%  was 95%   Peri-wound Assessment Edema   Drainage Amount Scant  was moderate    Drainage Description Serosanguineous   Treatment Cleansed   Wound Properties Date First Assessed: 05/30/16 Time First Assessed: 1015 Location: Leg Location Orientation: Left Wound Description (Comments): lateral aspect of LE  Present on Admission: Yes   Dressing Type Compression wrap;Impregnated gauze (bismuth)   Dressing Changed Changed   Dressing Status Old drainage   Dressing Change Frequency PRN   Site / Wound Assessment Painful;Yellow   % Wound base Red or Granulating 100%  was 5%   % Wound base Yellow/Fibrinous Exudate 0%  was 95%   Peri-wound Assessment Edema   Drainage Amount Scant   Drainage Description Serosanguineous   Treatment Cleansed   Wound Properties Date First Assessed: 05/30/16 Time First Assessed: 1000 Wound Type: Other (Comment) Location: Leg Location Orientation: Right;Lateral Wound Description (Comments):  Superior of the two lateral wounds  Present on Admission: Yes   Dressing Type Compression wrap;Silver dressings   Dressing Change Frequency PRN   Site / Wound Assessment Painful;Yellow   % Wound base Red or Granulating 75%  was 0   % Wound base Yellow/Fibrinous Exudate 25%  was 100   Peri-wound Assessment Edema   Drainage Amount Scant   Drainage Description Serosanguineous   Wound Properties Date First Assessed: 05/30/16 Time First Assessed: 1015 Wound Type: Other (Comment) Location: Leg Location Orientation: Right;Lateral Wound Description (Comments): More distal of the two lateral wounds  Present on Admission: Yes   Dressing Type Compression wrap;Silver hydrofiber   Dressing Changed Changed   Dressing Change Frequency PRN   Site / Wound Assessment Painful;Yellow   % Wound base Red or Granulating 35%  was 0   % Wound base Yellow/Fibrinous Exudate 65%  was 100   Drainage Amount Moderate   Drainage Description Serosanguineous   Treatment  Cleansed;Debridement (Selective)   Wound Properties Date First Assessed: 05/30/16 Time First Assessed: 1015 Wound Type: Other (Comment) Location: Leg Location Orientation: Right;Medial Present on Admission: Yes   Dressing Type Compression wrap;Silver hydrofiber   Dressing Status Old drainage   Dressing Change Frequency PRN   Site / Wound Assessment Painful   % Wound base Red or Granulating 80%  was 40   % Wound base Yellow/Fibrinous Exudate 20%   % Wound base Black/Eschar --  was 100   Drainage Amount Minimal   Drainage Description Serosanguineous   Treatment Cleansed   Wound Properties Date First Assessed: 05/30/16 Time First Assessed: 1015 Location: Leg Location Orientation: Posterior Wound Description (Comments): There are four seperate wounds very close together that spans 10x10 cm  Present on Admission: Yes   Dressing Type Compression wrap;Silver hydrofiber   Dressing Status Old drainage   Dressing Change Frequency PRN   Site / Wound Assessment Painful;Yellow   % Wound base Red or Granulating 15%   % Wound base Yellow/Fibrinous Exudate 85%   Peri-wound Assessment Edema   Drainage Amount Moderate   Drainage Description Serosanguineous   Treatment Cleansed;Debridement (Selective)   Selective Debridement - Location wound bed   Selective Debridement - Tools Used Forceps;Scalpel;Scissors   Selective Debridement - Tissue Removed slough    Wound Therapy - Clinical Statement Pt continues to be confused but will answer questions correctly when asked.  Pt edma in LE has decreased considerably.  Pt wounds continue to improve in granulation.  PT will continue to need skilled therapy for debridement of Lt anterior wound and Rt wounds.     Wound Therapy - Functional Problem List difficult to walk, decreased mobility    Factors Delaying/Impairing Wound Healing Altered sensation;Diabetes Mellitus;Infection - systemic/local;Immobility;Multiple medical problems;Polypharmacy;Vascular compromise    Hydrotherapy Plan Debridement;Dressing change;Patient/family education;Other (comment)  manual techniques and compression bandaging    Wound Therapy - Frequency Other (comment)  2x a week x 8 weeks    Wound Therapy - Current Recommendations PT   Wound Plan Total decongestive techniques debridement,  as needed.  Dress with silverhydrofiber and compression dressing   until wounds are not draining then may change dressing.     Dressing  silverhydrofiber on Lt anterior superior and Rt posterior wounds.  All other wounds recieved xeroform both LE recieved profore lite dresing. with extra cotton used to make sure LE has cone shape.    Manual Therapy Supraclavicular, deep and superficial abdominal followed by routing fluid using inguinal/axillary anastomosis and  hip to knee  anterior only  due to time constraints.  Little massage to distal LE's as very tender to the touch.                    PT Short Term Goals - 06/01/16 1639      PT SHORT TERM GOAL #1   Title Pt wounds to be 50% granulated to decrease risk of infection    Time 3   Period Weeks   Status On-going     PT SHORT TERM GOAL #2   Title Pt drainage to be minimal to decrease risk of infecton    Time 3   Period Weeks   Status On-going     PT SHORT TERM GOAL #3   Title Pt edema to be decreased by 3 cm to decrease risk of cellulitis and to be able to don socks on easier    Time 4   Period Weeks   Status On-going     PT SHORT TERM GOAL #4   Title Pt pain in both legs to have decreased to 7/10 to allow pt to transfer with moderate assistance    Time 3   Period Weeks   Status On-going           PT Long Term Goals - 06/01/16 1639      PT LONG TERM GOAL #1   Title Pt wounds to be 100% granulated to allow nursing staff to take over care of LE    Time 8   Period Weeks   Status On-going     PT LONG TERM GOAL #2   Title Pt wound size to have decreased by 50% for nursing staff to feel comfortable taking over wound  care    Time 8   Period Weeks   Status On-going     PT LONG TERM GOAL #3   Title Pt to have obtained compression garments,(most likely [id number])  for both LE and nursing staff to be donning and doffing    Time 8   Period Weeks   Status On-going     PT LONG TERM GOAL #4   Title Pt pain to be decreased to no greater than a 5/10 to allow pt to ambulate with rolling walker for five to ten minutes with supervision.    Time 8   Period Weeks   Status On-going               Plan - 06/19/16 1433    Clinical Impression Statement see above    Rehab Potential Fair   PT Frequency 2x / week   PT Duration 8 weeks   PT Treatment/Interventions Therapeutic exercise;Compression bandaging;Manual lymph drainage;Manual techniques;Patient/family education;Other (comment)  debridment both with sharp massage and  or pulse lavage    PT Next Visit Plan continue debridement, manual decongestive techniques and compression dressing.        Patient will benefit from skilled therapeutic intervention in order to improve the following deficits and impairments:  Decreased activity tolerance, Increased edema, Pain, Other (comment) (non healing wounds )  Visit Diagnosis: Pain in right lower leg  Pain in left lower leg  Lymphedema, not elsewhere classified  Varicose veins of left lower extremity with both ulcer of other part of lower extremity and inflammation (HCC)  Varicose veins of right lower extremity with both ulcer of other part of lower extremity and inflammation ([place])     Problem List Patient Active Problem List   Diagnosis Date Noted  . Acute  on chronic heart failure ([place]) 12/31/2015  . Acute on chronic diastolic CHF (congestive heart failure) ([employer]) 12/30/2015  . CKD (chronic kidney disease), stage III 12/30/2015  . Insulin dependent diabetes mellitus ([place]) 12/30/2015  . Essential hypertension 12/30/2015  . Hypothyroidism 12/30/2015  . Open knee wound 10/04/2015  . History of total  knee arthroplasty 03/19/2015  . Aftercare following surgery of the circulatory system, [place] 04/24/2014  . Symptomatic bradycardia 04/17/2014  . Peripheral vascular disease, unspecified 01/23/2014  . Venous stasis ulcer ([place]) 10/17/2013  . Atherosclerosis of native arteries of the extremities with ulceration(440.23) 08/29/2013  . Colles' fracture of left radius 08/19/2013  . Wrist fracture, left 06/13/2013  . Fall 06/13/2013  . Acute respiratory failure with hypoxia ([place]) 06/12/2013  . Visual hallucinations 06/12/2013  . DM (diabetes mellitus), type 2 ([place]) 06/11/2013  . Acute on chronic diastolic heart failure ([place]) 06/11/2013  . Morbid obesity ([place]) 06/11/2013  . ARF (acute renal failure) ([place]) 06/07/2013  . Cellulitis and abscess of leg 06/05/2013  . CHF exacerbation ([place]) 11/19/2012  . Chronic lower GI bleeding 11/19/2012  . Type II or unspecified type diabetes mellitus without mention of complication, not stated as uncontrolled 11/19/2012  . Chronic kidney disease (CKD), stage III (moderate) 11/19/2012  . Permanent atrial fibrillation ([place]) 11/15/2012  . Long term (current) use of anticoagulants 11/15/2012  . Anemia    Rayetta Humphrey, PT CLT [phone] 06/19/2016, 2:34 PM  [place] [address] [place], [place], [address] Phone: [phone]   Fax:  [phone]  Name: [name] MRN: [id number] Date of Birth: 1933-07-09

## 2016-06-21 ENCOUNTER — Ambulatory Visit (HOSPITAL_COMMUNITY): Payer: Medicare HMO | Admitting: Physical Therapy

## 2016-06-21 DIAGNOSIS — M79661 Pain in right lower leg: Secondary | ICD-10-CM | POA: Diagnosis not present

## 2016-06-21 DIAGNOSIS — M79662 Pain in left lower leg: Secondary | ICD-10-CM

## 2016-06-21 DIAGNOSIS — I890 Lymphedema, not elsewhere classified: Secondary | ICD-10-CM

## 2016-06-21 NOTE — Therapy (Signed)
[place], [place], [address] Phone: [phone]   Fax:  [phone]  Wound Care Therapy  Patient Details  Name: [name] MRN: [id number] Date of Birth: 03/08/1933 Referring Provider: Neale Burly  Encounter Date: 06/21/2016      PT End of Session - 06/21/16 1436    Visit Number 6   Number of Visits 16   Date for PT Re-Evaluation 06/29/16   Authorization Type [employer] medicare   Authorization - Visit Number 6   Authorization - Number of Visits 10   PT Start Time [id number]   PT Stop Time 1425   PT Time Calculation (min) 80 min   Activity Tolerance Patient tolerated treatment well   Behavior During Therapy [facility] for tasks assessed/performed      Past Medical History:  Diagnosis Date  . Acute on chronic congestive heart failure with left ventricular diastolic dysfunction ([place]) 06/11/2013  . Acute respiratory failure with hypoxia ([place]) 06/12/2013   secondary to acute on chronic diastolic failure.  . Anemia 2008  . Arthritis   . CAD (coronary artery disease)   . Cataract    bilaterla   . CRI (chronic renal insufficiency) 2008  . DM (diabetes mellitus) ([place]) 1999  . Fracture November 2014   Left wrist-03-12-15 no issues now  . GERD (gastroesophageal reflux disease)   . Hx of CABG 10/24/2007   Dr. Roxan Hockey  . Hyperlipemia   . Hypertension   . Obesity   . Pacemaker 10/24/2007   Medtronic EnRhythm; implanted for SSS, symptomatic bradycardia, PAF  . Permanent atrial fibrillation ([place])   . S/P CABG x 4   . Transfusion history   . Venous insufficiency    8-12-16left lower lateral outside healed leg ulcer-scabbed over.  . Wrist fracture, left 06/13/2013   03-12-15 no residual issues    Past Surgical History:  Procedure Laterality Date  . APPENDECTOMY     APPENDICITIS  . BACK SURGERY    . CARDIAC CATHETERIZATION  08/08/2007   3 vessel CAD (LAD, mid Cfx, mid RCA) (Dr. Marella Chimes)  . CATARACT  EXTRACTION, BILATERAL    . COLONOSCOPY WITH ESOPHAGOGASTRODUODENOSCOPY (EGD) N/A 11/25/2012   SLF:Two SMALL AC polyps/Small internal hemorrhoids/The colon IS SLIGHTLY redundant-EGD:Schatzki ring at the gastroesophageal junction/Small hiatal hernia/MILD Non-erosive gastritis  . CORONARY ANGIOPLASTY  11/16/2003   PCI to prox mid RCA (2.75x44mm chromium cobalt vision Guidant stent) (Dr. Marella Chimes)  . CORONARY ARTERY BYPASS GRAFT  10/24/2007   LIMA to LAD, SVG to DX1, SVG to OM, SVG to distal RCA (Dr. Roxan Hockey)  . ESOPHAGOGASTRODUODENOSCOPY  11/07/2007   [id number] Schatzki's ring.  Otherwise normal esophagus   . EYE SURGERY Left Aug. 2015   torned retina repair  . INSERT / REPLACE / REMOVE PACEMAKER    . JOINT REPLACEMENT Right   . NM MYOCAR PERF WALL MOTION  2011   dipyridamole myoview - moderate ischemai in mid anteroseptal basal inferior inferioer, mid lateral and basal inferolateral region; abnormal study, EF 41%; high risk scan  . PACEMAKER INSERTION  10/24/2007   MDT EnRhythm implanted by Dr Rollene Fare  . TONSILLECTOMY     age 82  . TOTAL KNEE ARTHROPLASTY Right    2012  . TOTAL KNEE ARTHROPLASTY Left 03/19/2015   Procedure: LEFT TOTAL KNEE ARTHROPLASTY;  Surgeon: Latanya Maudlin, MD;  Location: [facility];  Service: Orthopedics;  Laterality: Left;  . TRANSTHORACIC ECHOCARDIOGRAM  08/2012   EF 55-65%, mod conc hypertrophy; mild  AV regurg; calcified MV annulus with mod MR; LA mod diated; RV systolic pressure increased; RA mildly dilated; PA peak pressure 37mmHg  . TUBAL LIGATION      There were no vitals filed for this visit.                  Wound Therapy - 06/21/16 1427    Subjective Pt accompanied today by her son.  Appears drowsy but in no distress.  Pt transported in wheelchair.   Patient and Family Stated Goals Wounds to finally heal    Date of Onset 06/01/15   Prior Treatments multiple    Pain Assessment Faces   Faces Pain Scale Hurts little more   Pain Onset --   only with debridement on Rt LE.   Evaluation and Treatment Procedures Explained to Patient/Family Yes   Evaluation and Treatment Procedures Patient unable to consent due to mental status   Wound Properties Date First Assessed: 05/30/16 Wound Type: Other (Comment) Location: Leg Wound Description (Comments): Lt anterior superior wound  Present on Admission: Yes   Dressing Type Compression wrap;Silver hydrofiber   Dressing Changed Changed   Dressing Status Old drainage   Dressing Change Frequency PRN   Site / Wound Assessment Friable;Painful;Yellow   % Wound base Red or Granulating 60%   % Wound base Yellow/Fibrinous Exudate 40%   Peri-wound Assessment Edema   Margins Unattached edges (unapproximated)   Drainage Amount Moderate   Drainage Description Serosanguineous   Treatment Cleansed;Debridement (Selective)   Wound Properties Date First Assessed: 05/30/16 Time First Assessed: 1000 Wound Type: Other (Comment) Location: Leg Location Orientation: Left Wound Description (Comments): middle anterior wound    Dressing Type Compression wrap;Impregnated gauze (bismuth)   Dressing Changed Changed   Dressing Status Old drainage   Dressing Change Frequency PRN   Site / Wound Assessment Painful;Red   % Wound base Red or Granulating 100%  was 5   % Wound base Yellow/Fibrinous Exudate 0%  was 95   Peri-wound Assessment Edema   Drainage Amount Minimal   Drainage Description Serosanguineous   Treatment Cleansed;Debridement (Selective)   Wound Properties Date First Assessed: 05/30/16 Time First Assessed: 1000 Location: Leg Location Orientation: Left Wound Description (Comments): inferior anterior wound    Dressing Type Compression wrap;Impregnated gauze (bismuth)   Dressing Changed Changed   Dressing Status Old drainage   Dressing Change Frequency PRN   Site / Wound Assessment Painful;Red   % Wound base Red or Granulating 100%  was 5%   % Wound base Yellow/Fibrinous Exudate 0%  was 95%    Peri-wound Assessment Edema   Drainage Amount Minimal  was moderate    Drainage Description Serosanguineous   Treatment Cleansed;Debridement (Selective)   Wound Properties Date First Assessed: 05/30/16 Time First Assessed: 1015 Location: Leg Location Orientation: Left Wound Description (Comments): lateral aspect of LE  Present on Admission: Yes   Dressing Type Compression wrap;Impregnated gauze (bismuth)   Dressing Changed Changed   Dressing Status Old drainage   Dressing Change Frequency PRN   Site / Wound Assessment Painful;Yellow   % Wound base Red or Granulating 100%  was 5%   % Wound base Yellow/Fibrinous Exudate 0%  was 95%   Peri-wound Assessment Edema   Drainage Amount Scant   Drainage Description Serosanguineous   Treatment Cleansed   Wound Properties Date First Assessed: 05/30/16 Time First Assessed: 1000 Wound Type: Other (Comment) Location: Leg Location Orientation: Right;Lateral Wound Description (Comments): Superior of the two lateral wounds  Present on  Admission: Yes   Dressing Type Compression wrap;Silver dressings   Dressing Changed Changed   Dressing Status Intact   Dressing Change Frequency PRN   Site / Wound Assessment Painful;Yellow   % Wound base Red or Granulating 75%  was 0   % Wound base Yellow/Fibrinous Exudate 25%  was 100   Peri-wound Assessment Edema   Drainage Amount Minimal   Drainage Description Serosanguineous   Treatment Cleansed;Debridement (Selective)   Wound Properties Date First Assessed: 05/30/16 Time First Assessed: 1015 Wound Type: Other (Comment) Location: Leg Location Orientation: Right;Lateral Wound Description (Comments): More distal of the two lateral wounds  Present on Admission: Yes   Dressing Type Compression wrap;Silver hydrofiber   Dressing Changed Changed   Dressing Change Frequency PRN   Site / Wound Assessment Painful;Yellow   % Wound base Red or Granulating 50%  was 0   % Wound base Yellow/Fibrinous Exudate 50%  was 100    Drainage Amount Minimal   Drainage Description Serosanguineous   Treatment Cleansed;Debridement (Selective)   Wound Properties Date First Assessed: 05/30/16 Time First Assessed: 1015 Wound Type: Other (Comment) Location: Leg Location Orientation: Right;Medial Present on Admission: Yes   Dressing Type Compression wrap;Silver hydrofiber   Dressing Status Old drainage   Dressing Change Frequency PRN   Site / Wound Assessment Painful   % Wound base Red or Granulating 80%  was 40   % Wound base Yellow/Fibrinous Exudate 20%   % Wound base Black/Eschar --  was 100   Drainage Amount Minimal   Drainage Description Serosanguineous   Treatment Cleansed;Debridement (Selective)   Wound Properties Date First Assessed: 05/30/16 Time First Assessed: 1015 Location: Leg Location Orientation: Posterior Wound Description (Comments): There are four seperate wounds very close together that spans 10x10 cm  Present on Admission: Yes   Dressing Type Compression wrap;Silver hydrofiber   Dressing Changed Changed   Dressing Status Old drainage   Dressing Change Frequency PRN   Site / Wound Assessment Painful;Yellow   % Wound base Red or Granulating 15%   % Wound base Yellow/Fibrinous Exudate 85%   Peri-wound Assessment Edema   Drainage Amount Moderate   Drainage Description Serosanguineous   Treatment Cleansed;Debridement (Selective)   Selective Debridement - Location wound bed   Selective Debridement - Tools Used Forceps;Scalpel;Scissors   Selective Debridement - Tissue Removed slough    Wound Therapy - Clinical Statement improved tolerance to debridment.  no complaints with Lt LE, and most of Rt LE except for Rt posteriolateral wound.  This wound contains most adherent slough.  Able to remove significant amount from all wounds with concentration on edges mostly.  continued with silver hydrofiber on Lt inferior patella wound and Rt posteriolateral wounds.  All others dressed with xerform.  Noted reduction in  size and improved granulation following debridment today.     Wound Therapy - Functional Problem List difficult to walk, decreased mobility    Factors Delaying/Impairing Wound Healing Altered sensation;Diabetes Mellitus;Infection - systemic/local;Immobility;Multiple medical problems;Polypharmacy;Vascular compromise   Hydrotherapy Plan Debridement;Dressing change;Patient/family education;Other (comment)  manual techniques and compression bandaging    Wound Therapy - Frequency Other (comment)  2x a week x 8 weeks    Wound Therapy - Current Recommendations PT   Wound Plan Total decongestive techniques debridement,  as needed.  Dress with silverhydrofiber and compression dressing   until wounds are not draining then may change dressing.     Dressing  silverhydrofiber on Lt anterior superior and Rt posterior wounds.  All other wounds recieved xeroform  both LE recieved profore lite dresing. with extra cotton used to make sure LE has cone shape.    Manual Therapy Supraclavicular, deep and superficial abdominal followed by routing fluid using inguinal/axillary anastomosis and  hip to knee anterior only  due to time constraints.  Little massage to distal LE's as very tender to the touch.                    PT Short Term Goals - 06/01/16 1639      PT SHORT TERM GOAL #1   Title Pt wounds to be 50% granulated to decrease risk of infection    Time 3   Period Weeks   Status On-going     PT SHORT TERM GOAL #2   Title Pt drainage to be minimal to decrease risk of infecton    Time 3   Period Weeks   Status On-going     PT SHORT TERM GOAL #3   Title Pt edema to be decreased by 3 cm to decrease risk of cellulitis and to be able to don socks on easier    Time 4   Period Weeks   Status On-going     PT SHORT TERM GOAL #4   Title Pt pain in both legs to have decreased to 7/10 to allow pt to transfer with moderate assistance    Time 3   Period Weeks   Status On-going           PT Long  Term Goals - 06/01/16 1639      PT LONG TERM GOAL #1   Title Pt wounds to be 100% granulated to allow nursing staff to take over care of LE    Time 8   Period Weeks   Status On-going     PT LONG TERM GOAL #2   Title Pt wound size to have decreased by 50% for nursing staff to feel comfortable taking over wound care    Time 8   Period Weeks   Status On-going     PT LONG TERM GOAL #3   Title Pt to have obtained compression garments,(most likely [id number])  for both LE and nursing staff to be donning and doffing    Time 8   Period Weeks   Status On-going     PT LONG TERM GOAL #4   Title Pt pain to be decreased to no greater than a 5/10 to allow pt to ambulate with rolling walker for five to ten minutes with supervision.    Time 8   Period Weeks   Status On-going             Patient will benefit from skilled therapeutic intervention in order to improve the following deficits and impairments:     Visit Diagnosis: Pain in right lower leg  Pain in left lower leg  Lymphedema, not elsewhere classified     Problem List Patient Active Problem List   Diagnosis Date Noted  . Acute on chronic heart failure ([place]) 12/31/2015  . Acute on chronic diastolic CHF (congestive heart failure) ([place]) 12/30/2015  . CKD (chronic kidney disease), stage III 12/30/2015  . Insulin dependent diabetes mellitus ([place]) 12/30/2015  . Essential hypertension 12/30/2015  . Hypothyroidism 12/30/2015  . Open knee wound 10/04/2015  . History of total knee arthroplasty 03/19/2015  . Aftercare following surgery of the circulatory system, [place] 04/24/2014  . Symptomatic bradycardia 04/17/2014  . Peripheral vascular disease, unspecified 01/23/2014  . Venous stasis ulcer ([place])  10/17/2013  . Atherosclerosis of native arteries of the extremities with ulceration(440.23) 08/29/2013  . Colles' fracture of left radius 08/19/2013  . Wrist fracture, left 06/13/2013  . Fall 06/13/2013  . Acute respiratory failure  with hypoxia ([place]) 06/12/2013  . Visual hallucinations 06/12/2013  . DM (diabetes mellitus), type 2 ([place]) 06/11/2013  . Acute on chronic diastolic heart failure ([place]) 06/11/2013  . Morbid obesity ([place]) 06/11/2013  . ARF (acute renal failure) ([place]) 06/07/2013  . Cellulitis and abscess of leg 06/05/2013  . CHF exacerbation ([place]) 11/19/2012  . Chronic lower GI bleeding 11/19/2012  . Type II or unspecified type diabetes mellitus without mention of complication, not stated as uncontrolled 11/19/2012  . Chronic kidney disease (CKD), stage III (moderate) 11/19/2012  . Permanent atrial fibrillation ([place]) 11/15/2012  . Long term (current) use of anticoagulants 11/15/2012  . Anemia     Teena Irani, PTA/CLT [phone]  06/21/2016, 2:38 PM  [place] [address] [place], [place], [address] Phone: [phone]   Fax:  [phone]  Name: [name] MRN: [id number] Date of Birth: 03/24/1933

## 2016-06-26 ENCOUNTER — Ambulatory Visit (HOSPITAL_COMMUNITY): Payer: Medicare HMO | Admitting: Physical Therapy

## 2016-06-26 DIAGNOSIS — M79661 Pain in right lower leg: Secondary | ICD-10-CM

## 2016-06-26 DIAGNOSIS — I83228 Varicose veins of left lower extremity with both ulcer of other part of lower extremity and inflammation: Secondary | ICD-10-CM

## 2016-06-26 DIAGNOSIS — L97819 Non-pressure chronic ulcer of other part of right lower leg with unspecified severity: Secondary | ICD-10-CM

## 2016-06-26 DIAGNOSIS — I890 Lymphedema, not elsewhere classified: Secondary | ICD-10-CM

## 2016-06-26 DIAGNOSIS — R262 Difficulty in walking, not elsewhere classified: Secondary | ICD-10-CM

## 2016-06-26 DIAGNOSIS — L97829 Non-pressure chronic ulcer of other part of left lower leg with unspecified severity: Secondary | ICD-10-CM

## 2016-06-26 DIAGNOSIS — M79662 Pain in left lower leg: Secondary | ICD-10-CM

## 2016-06-26 DIAGNOSIS — I83218 Varicose veins of right lower extremity with both ulcer of other part of lower extremity and inflammation: Secondary | ICD-10-CM

## 2016-06-26 NOTE — Therapy (Signed)
[facility] [address] [place], [place], [address] Phone: [phone]   Fax:  [phone]  Physical Therapy Treatment  Patient Details  Name: CAPITOLA MCCARTIN MRN: [id number] Date of Birth: 30-Mar-1933 Referring Provider: Neale Burly  Encounter Date: 06/26/2016      PT End of Session - 06/26/16 1619    Visit Number 7   Number of Visits 16   Date for PT Re-Evaluation 06/29/16   Authorization Type [employer] medicare   Authorization - Visit Number 7   Authorization - Number of Visits 10   PT Start Time 1300   PT Stop Time 1423   PT Time Calculation (min) 83 min   Activity Tolerance Patient tolerated treatment well   Behavior During Therapy [facility] for tasks assessed/performed      Past Medical History:  Diagnosis Date  . Acute on chronic congestive heart failure with left ventricular diastolic dysfunction ([place]) 06/11/2013  . Acute respiratory failure with hypoxia ([place]) 06/12/2013   secondary to acute on chronic diastolic failure.  . Anemia 2008  . Arthritis   . CAD (coronary artery disease)   . Cataract    [place]   . CRI (chronic renal insufficiency) 2008  . DM (diabetes mellitus) ([place]) 1999  . Fracture November 2014   Left wrist-03-12-15 no issues now  . GERD (gastroesophageal reflux disease)   . Hx of CABG 10/24/2007   Dr. Roxan Hockey  . Hyperlipemia   . Hypertension   . Obesity   . Pacemaker 10/24/2007   Medtronic EnRhythm; implanted for SSS, symptomatic bradycardia, PAF  . Permanent atrial fibrillation ([place])   . S/P CABG x 4   . Transfusion history   . Venous insufficiency    8-12-16left lower lateral outside healed leg ulcer-scabbed over.  . Wrist fracture, left 06/13/2013   03-12-15 no residual issues    Past Surgical History:  Procedure Laterality Date  . APPENDECTOMY     APPENDICITIS  . BACK SURGERY    . CARDIAC CATHETERIZATION  08/08/2007   3 vessel CAD (LAD, mid Cfx, mid RCA) (Dr. Marella Chimes)  . CATARACT  EXTRACTION, BILATERAL    . COLONOSCOPY WITH ESOPHAGOGASTRODUODENOSCOPY (EGD) N/A 11/25/2012   SLF:Two SMALL AC polyps/Small internal hemorrhoids/The colon IS SLIGHTLY redundant-EGD:Schatzki ring at the gastroesophageal junction/Small hiatal hernia/MILD Non-erosive gastritis  . CORONARY ANGIOPLASTY  11/16/2003   PCI to prox mid RCA (2.75x83mm chromium cobalt vision Guidant stent) (Dr. Marella Chimes)  . CORONARY ARTERY BYPASS GRAFT  10/24/2007   LIMA to LAD, SVG to DX1, SVG to OM, SVG to distal RCA (Dr. Roxan Hockey)  . ESOPHAGOGASTRODUODENOSCOPY  11/07/2007   [id number] Schatzki's ring.  Otherwise normal esophagus   . EYE SURGERY Left Aug. 2015   torned retina repair  . INSERT / REPLACE / REMOVE PACEMAKER    . JOINT REPLACEMENT Right   . NM MYOCAR PERF WALL MOTION  2011   dipyridamole myoview - moderate ischemai in mid anteroseptal basal inferior inferioer, mid lateral and basal inferolateral region; abnormal study, EF 41%; high risk scan  . PACEMAKER INSERTION  10/24/2007   MDT EnRhythm implanted by Dr Rollene Fare  . TONSILLECTOMY     age 82  . TOTAL KNEE ARTHROPLASTY Right    2012  . TOTAL KNEE ARTHROPLASTY Left 03/19/2015   Procedure: LEFT TOTAL KNEE ARTHROPLASTY;  Surgeon: Latanya Maudlin, MD;  Location: [facility];  Service: Orthopedics;  Laterality: Left;  . TRANSTHORACIC ECHOCARDIOGRAM  08/2012   EF 55-65%, mod conc hypertrophy; mild  AV regurg; calcified MV annulus with mod MR; LA mod diated; RV systolic pressure increased; RA mildly dilated; PA peak pressure 14mmHg  . TUBAL LIGATION      There were no vitals filed for this visit.                     Wound Therapy - 06/26/16 1429    Pain Intervention(s) Distraction   Evaluation and Treatment Procedures Explained to Patient/Family Yes   Evaluation and Treatment Procedures Patient unable to consent due to mental status   Wound Properties Date First Assessed: 05/30/16 Wound Type: Other (Comment) Location: Leg Wound Description  (Comments): Lt anterior superior wound  Present on Admission: Yes   Dressing Type Compression wrap;Silver hydrofiber   Dressing Status Old drainage   Dressing Change Frequency PRN   Site / Wound Assessment Friable;Painful;Yellow   % Wound base Red or Granulating 60%  was 60%   % Wound base Yellow/Fibrinous Exudate 0%   Peri-wound Assessment Edema   Wound Length (cm) 6 cm   Wound Width (cm) 5 cm   Wound Depth (cm) 0.3 cm   Margins Unattached edges (unapproximated)   Drainage Amount Moderate   Drainage Description Serosanguineous   Treatment Cleansed;Debridement (Selective)   Wound Properties Date First Assessed: 05/30/16 Time First Assessed: 1000 Wound Type: Other (Comment) Location: Leg Location Orientation: Left Wound Description (Comments): middle anterior wound    Dressing Type Compression wrap;Impregnated gauze (bismuth)   Dressing Status Old drainage   Dressing Change Frequency PRN   Site / Wound Assessment Painful;Red   % Wound base Red or Granulating 100%  was 5   % Wound base Yellow/Fibrinous Exudate 0%  was 95   Peri-wound Assessment Edema   Wound Length (cm) 1.8 cm   Wound Width (cm) 1 cm   Drainage Amount Minimal   Drainage Description Serosanguineous   Wound Properties Date First Assessed: 05/30/16 Time First Assessed: 1000 Location: Leg Location Orientation: Left Wound Description (Comments): inferior anterior wound    Dressing Type Compression wrap   Dressing Status Old drainage   Dressing Change Frequency PRN   Site / Wound Assessment Red   % Wound base Red or Granulating 100%   % Wound base Yellow/Fibrinous Exudate 0%   Peri-wound Assessment Edema   Wound Length (cm) 3 cm   Wound Width (cm) 1.5 cm   Drainage Amount Scant   Drainage Description Serosanguineous   Treatment Cleansed;Debridement (Selective)   Wound Properties Date First Assessed: 05/30/16 Time First Assessed: 1015 Location: Leg Location Orientation: Left Wound Description (Comments): lateral  aspect of LE  Present on Admission: Yes Final Assessment Date: 06/26/16 Final Assessment Time: 1424   Wound Properties Date First Assessed: 05/30/16 Time First Assessed: 1000 Wound Type: Other (Comment) Location: Leg Location Orientation: Right;Lateral Wound Description (Comments): Superior of the two lateral wounds  Present on Admission: Yes   Dressing Type Compression wrap   Dressing Changed Changed   Dressing Status Intact;Old drainage   Dressing Change Frequency PRN   Site / Wound Assessment Painful;Yellow   % Wound base Red or Granulating 90%   % Wound base Yellow/Fibrinous Exudate 10%   Wound Length (cm) 1.5 cm   Wound Width (cm) 1 cm   Drainage Amount Minimal   Drainage Description Serosanguineous   Treatment Cleansed;Debridement (Selective)   Wound Properties Date First Assessed: 05/30/16 Time First Assessed: 1015 Wound Type: Other (Comment) Location: Leg Location Orientation: Right;Lateral Wound Description (Comments): More distal of the two lateral  wounds  Present on Admission: Yes   Dressing Type Compression wrap   Dressing Changed Changed   Dressing Status Dry;Old drainage   Dressing Change Frequency PRN   Site / Wound Assessment Painful;Yellow   % Wound base Red or Granulating 20%   % Wound base Yellow/Fibrinous Exudate 80%   Wound Length (cm) 4 cm   Wound Width (cm) 4 cm   Drainage Amount Moderate   Drainage Description Serosanguineous   Treatment Cleansed;Debridement (Selective)   Wound Properties Date First Assessed: 05/30/16 Time First Assessed: 1015 Wound Type: Other (Comment) Location: Leg Location Orientation: Right;Medial Present on Admission: Yes   Dressing Type Compression wrap   Dressing Changed Changed   Dressing Status Old drainage   Dressing Change Frequency PRN   Site / Wound Assessment Granulation tissue   % Wound base Red or Granulating 100%   Drainage Amount Scant   Treatment Cleansed   Wound Properties Date First Assessed: 05/30/16 Time First  Assessed: 1015 Location: Leg Location Orientation: Posterior Wound Description (Comments): There are four seperate wounds very close together that spans 10x10 cm  Present on Admission: Yes   Dressing Type Compression wrap   Dressing Changed Changed   Dressing Status Old drainage   Dressing Change Frequency PRN   Site / Wound Assessment Painful;Yellow   % Wound base Red or Granulating 15%   % Wound base Yellow/Fibrinous Exudate 85%   Wound Length (cm) 10 cm   Wound Width (cm) 5 cm   Drainage Amount Moderate   Drainage Description Serosanguineous   Treatment Cleansed;Debridement (Selective)   Selective Debridement - Location wound bed   Selective Debridement - Tools Used Forceps;Scalpel   Selective Debridement - Tissue Removed slough   Wound Therapy - Clinical Statement Pt able to tolerate debridement of Lt LE but continues to have significant pain with any debridement on her Rt LE.  Bandages have slid down some with significant increased swelling noted just superior to bandage.  Pthad increased pain with manual lymph drainage techniques but continued to completed to decrease edema.     Wound Therapy - Functional Problem List difficulty to walk   Factors Delaying/Impairing Wound Healing Altered sensation   Hydrotherapy Plan Debridement;Dressing change;Patient/family education   Wound Therapy - Current Recommendations PT   Wound Plan manual to include supraclavicular, deep and superfical abdominal, using inguinal/axillary pathway and LE bilaterally.  Completed in chair and anterirorly only followed by multilayer compression dressing.    Dressing  silverhydrofiber to Lt anterior wound; Rt lateral and posterior wound, xeroform to others followed by compression dressing using profore bandage system.                     PT Short Term Goals - 06/26/16 1620      PT SHORT TERM GOAL #1   Title Pt wounds to be 50% granulated to decrease risk of infection    Time 3   Period Weeks    Status On-going     PT SHORT TERM GOAL #2   Title Pt drainage to be minimal to decrease risk of infecton    Time 3   Period Weeks   Status On-going     PT SHORT TERM GOAL #3   Title Pt edema to be decreased by 3 cm to decrease risk of cellulitis and to be able to don socks on easier    Time 4   Period Weeks   Status On-going     PT SHORT TERM GOAL #  4   Title Pt pain in both legs to have decreased to 7/10 to allow pt to transfer with moderate assistance    Time 3   Period Weeks   Status On-going           PT Long Term Goals - 06/26/16 1620      PT LONG TERM GOAL #1   Title Pt wounds to be 100% granulated to allow nursing staff to take over care of LE    Time 8   Period Weeks   Status On-going     PT LONG TERM GOAL #2   Title Pt wound size to have decreased by 50% for nursing staff to feel comfortable taking over wound care    Time 8   Period Weeks   Status On-going     PT LONG TERM GOAL #3   Title Pt to have obtained compression garments,(most likely [id number])  for both LE and nursing staff to be donning and doffing    Time 8   Period Weeks   Status On-going     PT LONG TERM GOAL #4   Title Pt pain to be decreased to no greater than a 5/10 to allow pt to ambulate with rolling walker for five to ten minutes with supervision.    Time 8   Period Weeks   Status On-going               Plan - 06/26/16 1620    Clinical Impression Statement as above   Rehab Potential Fair   PT Frequency 2x / week   PT Duration 8 weeks   PT Treatment/Interventions Therapeutic exercise;Compression bandaging;Manual lymph drainage;Manual techniques;Patient/family education;Other (comment)  debridment both with sharp massage and  or pulse lavage    PT Next Visit Plan continue debridement, manual decongestive techniques and compression dressing.        Patient will benefit from skilled therapeutic intervention in order to improve the following deficits and impairments:   Decreased activity tolerance, Increased edema, Pain, Other (comment) (non healing wounds )  Visit Diagnosis: Pain in right lower leg  Pain in left lower leg  Lymphedema, not elsewhere classified  Varicose veins of left lower extremity with both ulcer of other part of lower extremity and inflammation (HCC)  Varicose veins of right lower extremity with both ulcer of other part of lower extremity and inflammation (HCC)  Difficulty in walking, not elsewhere classified     Problem List Patient Active Problem List   Diagnosis Date Noted  . Acute on chronic heart failure ([place]) 12/31/2015  . Acute on chronic diastolic CHF (congestive heart failure) ([place]) 12/30/2015  . CKD (chronic kidney disease), stage III 12/30/2015  . Insulin dependent diabetes mellitus ([place]) 12/30/2015  . Essential hypertension 12/30/2015  . Hypothyroidism 12/30/2015  . Open knee wound 10/04/2015  . History of total knee arthroplasty 03/19/2015  . Aftercare following surgery of the circulatory system, [place] 04/24/2014  . Symptomatic bradycardia 04/17/2014  . Peripheral vascular disease, unspecified 01/23/2014  . Venous stasis ulcer ([place]) 10/17/2013  . Atherosclerosis of native arteries of the extremities with ulceration(440.23) 08/29/2013  . Colles' fracture of left radius 08/19/2013  . Wrist fracture, left 06/13/2013  . Fall 06/13/2013  . Acute respiratory failure with hypoxia ([place]) 06/12/2013  . Visual hallucinations 06/12/2013  . DM (diabetes mellitus), type 2 ([place]) 06/11/2013  . Acute on chronic diastolic heart failure ([place]) 06/11/2013  . Morbid obesity ([place]) 06/11/2013  . ARF (acute renal failure) ([place]) 06/07/2013  .  Cellulitis and abscess of leg 06/05/2013  . CHF exacerbation ([place]) 11/19/2012  . Chronic lower GI bleeding 11/19/2012  . Type II or unspecified type diabetes mellitus without mention of complication, not stated as uncontrolled 11/19/2012  . Chronic kidney disease (CKD), stage III (moderate)  11/19/2012  . Permanent atrial fibrillation ([place]) 11/15/2012  . Long term (current) use of anticoagulants 11/15/2012  . Anemia   Rayetta Humphrey, PT CLT [phone] 06/26/2016, 4:21 PM  [place], [place], [address] Phone: [phone]   Fax:  [phone]  Name: MABELL DEVER MRN: [id number] Date of Birth: 11-21-1932

## 2016-06-29 ENCOUNTER — Ambulatory Visit (HOSPITAL_COMMUNITY): Payer: Medicare HMO | Admitting: Physical Therapy

## 2016-06-29 DIAGNOSIS — I83218 Varicose veins of right lower extremity with both ulcer of other part of lower extremity and inflammation: Secondary | ICD-10-CM

## 2016-06-29 DIAGNOSIS — R262 Difficulty in walking, not elsewhere classified: Secondary | ICD-10-CM

## 2016-06-29 DIAGNOSIS — M79661 Pain in right lower leg: Secondary | ICD-10-CM | POA: Diagnosis not present

## 2016-06-29 DIAGNOSIS — I83228 Varicose veins of left lower extremity with both ulcer of other part of lower extremity and inflammation: Secondary | ICD-10-CM

## 2016-06-29 DIAGNOSIS — L97819 Non-pressure chronic ulcer of other part of right lower leg with unspecified severity: Secondary | ICD-10-CM

## 2016-06-29 DIAGNOSIS — L97829 Non-pressure chronic ulcer of other part of left lower leg with unspecified severity: Secondary | ICD-10-CM

## 2016-06-29 DIAGNOSIS — I890 Lymphedema, not elsewhere classified: Secondary | ICD-10-CM

## 2016-06-29 NOTE — Therapy (Signed)
[place], [place], [address] Phone: [phone]   Fax:  [phone]  Physical Therapy Treatment  Patient Details  Name: [name] MRN: [id number] Date of Birth: 02-06-1933 Referring Provider: Neale Burly  Encounter Date: 06/29/2016      PT End of Session - 06/29/16 1516    Visit Number 8   Number of Visits 16   Date for PT Re-Evaluation 06/29/16   Authorization Type [employer] medicare   Authorization - Visit Number 8   Authorization - Number of Visits 10   PT Start Time [id number]  Pt late for appointment   PT Stop Time 1425   PT Time Calculation (min) 70 min   Activity Tolerance Patient tolerated treatment well   Behavior During Therapy [facility] for tasks assessed/performed      Past Medical History:  Diagnosis Date  . Acute on chronic congestive heart failure with left ventricular diastolic dysfunction ([place]) 06/11/2013  . Acute respiratory failure with hypoxia ([place]) 06/12/2013   secondary to acute on chronic diastolic failure.  . Anemia 2008  . Arthritis   . CAD (coronary artery disease)   . Cataract    bilaterla   . CRI (chronic renal insufficiency) 2008  . DM (diabetes mellitus) ([place]) 1999  . Fracture November 2014   Left wrist-03-12-15 no issues now  . GERD (gastroesophageal reflux disease)   . Hx of CABG 10/24/2007   Dr. Roxan Hockey  . Hyperlipemia   . Hypertension   . Obesity   . Pacemaker 10/24/2007   Medtronic EnRhythm; implanted for SSS, symptomatic bradycardia, PAF  . Permanent atrial fibrillation ([place])   . S/P CABG x 4   . Transfusion history   . Venous insufficiency    8-12-16left lower lateral outside healed leg ulcer-scabbed over.  . Wrist fracture, left 06/13/2013   03-12-15 no residual issues    Past Surgical History:  Procedure Laterality Date  . APPENDECTOMY     APPENDICITIS  . BACK SURGERY    . CARDIAC CATHETERIZATION  08/08/2007   3 vessel CAD (LAD, mid Cfx, mid RCA) (Dr.  Marella Chimes)  . CATARACT EXTRACTION, BILATERAL    . COLONOSCOPY WITH ESOPHAGOGASTRODUODENOSCOPY (EGD) N/A 11/25/2012   SLF:Two SMALL AC polyps/Small internal hemorrhoids/The colon IS SLIGHTLY redundant-EGD:Schatzki ring at the gastroesophageal junction/Small hiatal hernia/MILD Non-erosive gastritis  . CORONARY ANGIOPLASTY  11/16/2003   PCI to prox mid RCA (2.75x21mm chromium cobalt vision Guidant stent) (Dr. Marella Chimes)  . CORONARY ARTERY BYPASS GRAFT  10/24/2007   LIMA to LAD, SVG to DX1, SVG to OM, SVG to distal RCA (Dr. Roxan Hockey)  . ESOPHAGOGASTRODUODENOSCOPY  11/07/2007   [id number] Schatzki's ring.  Otherwise normal esophagus   . EYE SURGERY Left Aug. 2015   torned retina repair  . INSERT / REPLACE / REMOVE PACEMAKER    . JOINT REPLACEMENT Right   . NM MYOCAR PERF WALL MOTION  2011   dipyridamole myoview - moderate ischemai in mid anteroseptal basal inferior inferioer, mid lateral and basal inferolateral region; abnormal study, EF 41%; high risk scan  . PACEMAKER INSERTION  10/24/2007   MDT EnRhythm implanted by Dr Rollene Fare  . TONSILLECTOMY     age 76  . TOTAL KNEE ARTHROPLASTY Right    2012  . TOTAL KNEE ARTHROPLASTY Left 03/19/2015   Procedure: LEFT TOTAL KNEE ARTHROPLASTY;  Surgeon: Latanya Maudlin, MD;  Location: [facility];  Service: Orthopedics;  Laterality: Left;  . TRANSTHORACIC ECHOCARDIOGRAM  08/2012   EF  55-65%, mod conc hypertrophy; mild AV regurg; calcified MV annulus with mod MR; LA mod diated; RV systolic pressure increased; RA mildly dilated; PA peak pressure 54mmHg  . TUBAL LIGATION      There were no vitals filed for this visit.                     Wound Therapy - 06/29/16 1428    Subjective Pt states that her left leg does not hurt at all just her right.     Patient and Family Stated Goals Wounds to finally heal    Date of Onset 06/01/15   Prior Treatments multiple    Faces Pain Scale Hurts whole lot   Pain Intervention(s) Distraction    Evaluation and Treatment Procedures Explained to Patient/Family Yes   Evaluation and Treatment Procedures Patient unable to consent due to mental status   Wound Properties Date First Assessed: 05/30/16 Wound Type: Other (Comment) Location: Leg Wound Description (Comments): Lt anterior superior wound  Present on Admission: Yes   Dressing Type Compression wrap;Silver hydrofiber   Dressing Status Old drainage   Dressing Change Frequency PRN   Site / Wound Assessment Friable;Painful;Yellow   % Wound base Red or Granulating 60%   % Wound base Yellow/Fibrinous Exudate 40%   Peri-wound Assessment Edema   Margins Unattached edges (unapproximated)   Drainage Amount Moderate   Drainage Description Serosanguineous   Treatment Cleansed;Debridement (Selective)   Wound Properties Date First Assessed: 05/30/16 Time First Assessed: 1000 Wound Type: Other (Comment) Location: Leg Location Orientation: Left Wound Description (Comments): middle anterior wound    Dressing Type Compression wrap;Impregnated gauze (bismuth)   Dressing Status Old drainage   Dressing Change Frequency PRN   Site / Wound Assessment Painful;Red   % Wound base Red or Granulating 100%  was 5   % Wound base Yellow/Fibrinous Exudate 0%  was 95   Peri-wound Assessment Edema   Drainage Amount Minimal   Drainage Description Serosanguineous   Treatment Cleansed;Debridement (Selective)   Wound Properties Date First Assessed: 05/30/16 Time First Assessed: 1000 Location: Leg Location Orientation: Left Wound Description (Comments): inferior anterior wound    Dressing Type Compression wrap;Impregnated gauze (bismuth)   Dressing Status Old drainage   Dressing Change Frequency PRN   Site / Wound Assessment Painful;Red   % Wound base Red or Granulating 100%  was 5%   % Wound base Yellow/Fibrinous Exudate 0%  was 95%   Peri-wound Assessment Edema   Drainage Amount Minimal  was moderate    Drainage Description Serosanguineous   Treatment  Cleansed;Debridement (Selective)   Wound Properties Date First Assessed: 05/30/16 Time First Assessed: 1000 Wound Type: Other (Comment) Location: Leg Location Orientation: Right;Lateral Wound Description (Comments): Superior of the two lateral wounds  Present on Admission: Yes   Dressing Type Compression wrap;Silver dressings   Dressing Status Intact   Dressing Change Frequency PRN   Site / Wound Assessment Painful;Yellow   % Wound base Red or Granulating 75%  was 0   % Wound base Yellow/Fibrinous Exudate 25%  was 100   Peri-wound Assessment Edema   Drainage Amount Minimal   Drainage Description Serosanguineous   Treatment Cleansed;Debridement (Selective)   Wound Properties Date First Assessed: 05/30/16 Time First Assessed: 1015 Wound Type: Other (Comment) Location: Leg Location Orientation: Right;Lateral Wound Description (Comments): More distal of the two lateral wounds  Present on Admission: Yes   Dressing Type Compression wrap;Silver hydrofiber   Dressing Changed Changed   Dressing Status Old drainage  Dressing Change Frequency PRN   Site / Wound Assessment Painful;Yellow   % Wound base Red or Granulating 50%  was 0   % Wound base Yellow/Fibrinous Exudate 50%  was 100   Drainage Amount Minimal   Drainage Description Serosanguineous   Treatment Cleansed;Debridement (Selective)   Wound Properties Date First Assessed: 05/30/16 Time First Assessed: 1015 Wound Type: Other (Comment) Location: Leg Location Orientation: Right;Medial Present on Admission: Yes   Dressing Type Compression wrap;Silver hydrofiber   Dressing Status Old drainage   Dressing Change Frequency PRN   Site / Wound Assessment Painful   % Wound base Red or Granulating 80%  was 40   % Wound base Yellow/Fibrinous Exudate 20%   % Wound base Black/Eschar --  was 100   Drainage Amount Minimal   Drainage Description Serosanguineous   Treatment Cleansed   Wound Properties Date First Assessed: 05/30/16 Time First  Assessed: 1015 Location: Leg Location Orientation: Posterior Wound Description (Comments): There are four seperate wounds very close together that spans 10x10 cm  Present on Admission: Yes   Dressing Type Compression wrap;Silver hydrofiber   Dressing Changed Changed   Dressing Status Old drainage   Dressing Change Frequency PRN   Site / Wound Assessment Painful;Yellow   % Wound base Red or Granulating 10%   % Wound base Yellow/Fibrinous Exudate 90%   Peri-wound Assessment Edema   Drainage Amount Moderate   Drainage Description Serosanguineous   Treatment Cleansed;Debridement (Selective)   Selective Debridement - Location wound bed   Selective Debridement - Tools Used Forceps;Scalpel;Scissors   Selective Debridement - Tissue Removed slough    Wound Therapy - Clinical Statement Pt has increased edema in B LE changed from profore lite to profore dressing and spent extra time on manual.  Changed dressing on anterior wounds of Lt LE back to silverhydrofiber secondary to increased maceration around wounds.    Wound Therapy - Functional Problem List difficult to walk, decreased mobility    Factors Delaying/Impairing Wound Healing Altered sensation;Diabetes Mellitus;Infection - systemic/local;Immobility;Multiple medical problems;Polypharmacy;Vascular compromise   Hydrotherapy Plan Debridement;Dressing change;Patient/family education;Other (comment)  manual techniques and compression bandaging    Wound Therapy - Frequency Other (comment)  2x a week x 8 weeks    Wound Therapy - Current Recommendations PT   Wound Plan Total decongestive techniques debridement,  as needed.  Dress with silverhydrofiber and compression dressing   until wounds are not draining then may change dressing.     Dressing  silverhydrofiber on Lt LE wounds and Rt posterior wounds.  All other wounds recieved xeroform both LE recieved profore dresing. with extra cotton used to make sure LE has cone shape.    Manual Therapy  Supraclavicular, deep and superficial abdominal followed by routing fluid using inguinal/axillary anastomosis and  hip to knee anterior only  due to time constraints.  Little massage to distal LE's as very tender to the touch.                     PT Short Term Goals - 06/26/16 1620      PT SHORT TERM GOAL #1   Title Pt wounds to be 50% granulated to decrease risk of infection    Time 3   Period Weeks   Status On-going     PT SHORT TERM GOAL #2   Title Pt drainage to be minimal to decrease risk of infecton    Time 3   Period Weeks   Status On-going     PT  SHORT TERM GOAL #3   Title Pt edema to be decreased by 3 cm to decrease risk of cellulitis and to be able to don socks on easier    Time 4   Period Weeks   Status On-going     PT SHORT TERM GOAL #4   Title Pt pain in both legs to have decreased to 7/10 to allow pt to transfer with moderate assistance    Time 3   Period Weeks   Status On-going           PT Long Term Goals - 06/26/16 1620      PT LONG TERM GOAL #1   Title Pt wounds to be 100% granulated to allow nursing staff to take over care of LE    Time 8   Period Weeks   Status On-going     PT LONG TERM GOAL #2   Title Pt wound size to have decreased by 50% for nursing staff to feel comfortable taking over wound care    Time 8   Period Weeks   Status On-going     PT LONG TERM GOAL #3   Title Pt to have obtained compression garments,(most likely [id number])  for both LE and nursing staff to be donning and doffing    Time 8   Period Weeks   Status On-going     PT LONG TERM GOAL #4   Title Pt pain to be decreased to no greater than a 5/10 to allow pt to ambulate with rolling walker for five to ten minutes with supervision.    Time 8   Period Weeks   Status On-going               Plan - 06/29/16 1516    Clinical Impression Statement as above    Rehab Potential Fair   PT Frequency 2x / week   PT Duration 8 weeks   PT  Treatment/Interventions Therapeutic exercise;Compression bandaging;Manual lymph drainage;Manual techniques;Patient/family education;Other (comment)  debridment both with sharp massage and  or pulse lavage    PT Next Visit Plan continue debridement, manual decongestive techniques and compression dressing.        Patient will benefit from skilled therapeutic intervention in order to improve the following deficits and impairments:  Decreased activity tolerance, Increased edema, Pain, Other (comment) (non healing wounds )  Visit Diagnosis: Pain in right lower leg  Lymphedema, not elsewhere classified  Varicose veins of left lower extremity with both ulcer of other part of lower extremity and inflammation (HCC)  Varicose veins of right lower extremity with both ulcer of other part of lower extremity and inflammation (HCC)  Difficulty in walking, not elsewhere classified     Problem List Patient Active Problem List   Diagnosis Date Noted  . Acute on chronic heart failure ([place]) 12/31/2015  . Acute on chronic diastolic CHF (congestive heart failure) ([place]) 12/30/2015  . CKD (chronic kidney disease), stage III 12/30/2015  . Insulin dependent diabetes mellitus ([place]) 12/30/2015  . Essential hypertension 12/30/2015  . Hypothyroidism 12/30/2015  . Open knee wound 10/04/2015  . History of total knee arthroplasty 03/19/2015  . Aftercare following surgery of the circulatory system, [place] 04/24/2014  . Symptomatic bradycardia 04/17/2014  . Peripheral vascular disease, unspecified 01/23/2014  . Venous stasis ulcer ([place]) 10/17/2013  . Atherosclerosis of native arteries of the extremities with ulceration(440.23) 08/29/2013  . Colles' fracture of left radius 08/19/2013  . Wrist fracture, left 06/13/2013  . Fall 06/13/2013  . Acute  respiratory failure with hypoxia ([place]) 06/12/2013  . Visual hallucinations 06/12/2013  . DM (diabetes mellitus), type 2 ([place]) 06/11/2013  . Acute on chronic diastolic  heart failure ([place]) 06/11/2013  . Morbid obesity ([place]) 06/11/2013  . ARF (acute renal failure) ([place]) 06/07/2013  . Cellulitis and abscess of leg 06/05/2013  . CHF exacerbation ([place]) 11/19/2012  . Chronic lower GI bleeding 11/19/2012  . Type II or unspecified type diabetes mellitus without mention of complication, not stated as uncontrolled 11/19/2012  . Chronic kidney disease (CKD), stage III (moderate) 11/19/2012  . Permanent atrial fibrillation ([place]) 11/15/2012  . Long term (current) use of anticoagulants 11/15/2012  . Anemia   Rayetta Humphrey, PT CLT [phone] 06/29/2016, 3:17 PM  [facility] [address] [place], [place], [address] Phone: [phone]   Fax:  [phone]  Name: [name] MRN: [id number] Date of Birth: [birth year]

## 2016-06-30 ENCOUNTER — Telehealth (HOSPITAL_COMMUNITY): Payer: Self-pay | Admitting: Physical Therapy

## 2016-06-30 NOTE — Telephone Encounter (Signed)
Called Wells Guiles back to confirm the added apptment of 12/15 @ 4pm l/m Nf 06/30/16

## 2016-07-05 ENCOUNTER — Ambulatory Visit (HOSPITAL_COMMUNITY): Payer: Medicare HMO | Attending: Internal Medicine | Admitting: Physical Therapy

## 2016-07-05 DIAGNOSIS — M79662 Pain in left lower leg: Secondary | ICD-10-CM | POA: Insufficient documentation

## 2016-07-05 DIAGNOSIS — I890 Lymphedema, not elsewhere classified: Secondary | ICD-10-CM

## 2016-07-05 DIAGNOSIS — M79661 Pain in right lower leg: Secondary | ICD-10-CM | POA: Diagnosis present

## 2016-07-05 DIAGNOSIS — R262 Difficulty in walking, not elsewhere classified: Secondary | ICD-10-CM | POA: Diagnosis present

## 2016-07-05 DIAGNOSIS — L97819 Non-pressure chronic ulcer of other part of right lower leg with unspecified severity: Secondary | ICD-10-CM

## 2016-07-05 DIAGNOSIS — I83228 Varicose veins of left lower extremity with both ulcer of other part of lower extremity and inflammation: Secondary | ICD-10-CM | POA: Insufficient documentation

## 2016-07-05 DIAGNOSIS — L97829 Non-pressure chronic ulcer of other part of left lower leg with unspecified severity: Secondary | ICD-10-CM

## 2016-07-05 DIAGNOSIS — I83218 Varicose veins of right lower extremity with both ulcer of other part of lower extremity and inflammation: Secondary | ICD-10-CM | POA: Insufficient documentation

## 2016-07-05 NOTE — Therapy (Signed)
[place], [place], [address] Phone: [phone]   Fax:  [phone]  Wound Care Therapy  Patient Details  Name: [name] MRN: [id number] Date of Birth: [birth year] Referring Provider: Neale Burly  Encounter Date: 07/05/2016      PT End of Session - 07/05/16 1444    Visit Number 9   Number of Visits 16   Date for PT Re-Evaluation 06/29/16   Authorization Type [employer] medicare   Authorization - Visit Number 9   Authorization - Number of Visits 10   PT Start Time [id number]   PT Stop Time 1425   PT Time Calculation (min) 80 min   Activity Tolerance Patient tolerated treatment well   Behavior During Therapy [facility] for tasks assessed/performed      Past Medical History:  Diagnosis Date  . Acute on chronic congestive heart failure with left ventricular diastolic dysfunction ([place]) 06/11/2013  . Acute respiratory failure with hypoxia ([place]) 06/12/2013   secondary to acute on chronic diastolic failure.  . Anemia 2008  . Arthritis   . CAD (coronary artery disease)   . Cataract    [place]   . CRI (chronic renal insufficiency) 2008  . DM (diabetes mellitus) ([place]) 1999  . Fracture November 2014   Left wrist-03-12-15 no issues now  . GERD (gastroesophageal reflux disease)   . Hx of CABG 10/24/2007   Dr. Roxan Hockey  . Hyperlipemia   . Hypertension   . Obesity   . Pacemaker 10/24/2007   Medtronic EnRhythm; implanted for SSS, symptomatic bradycardia, PAF  . Permanent atrial fibrillation ([place])   . S/P CABG x 4   . Transfusion history   . Venous insufficiency    8-12-16left lower lateral outside healed leg ulcer-scabbed over.  . Wrist fracture, left 06/13/2013   03-12-15 no residual issues    Past Surgical History:  Procedure Laterality Date  . APPENDECTOMY     APPENDICITIS  . BACK SURGERY    . CARDIAC CATHETERIZATION  08/08/2007   3 vessel CAD (LAD, mid Cfx, mid RCA) (Dr. Marella Chimes)  . CATARACT  EXTRACTION, BILATERAL    . COLONOSCOPY WITH ESOPHAGOGASTRODUODENOSCOPY (EGD) N/A 11/25/2012   SLF:Two SMALL AC polyps/Small internal hemorrhoids/The colon IS SLIGHTLY redundant-EGD:Schatzki ring at the gastroesophageal junction/Small hiatal hernia/MILD Non-erosive gastritis  . CORONARY ANGIOPLASTY  11/16/2003   PCI to prox mid RCA (2.75x66mm chromium cobalt vision Guidant stent) (Dr. Marella Chimes)  . CORONARY ARTERY BYPASS GRAFT  10/24/2007   LIMA to LAD, SVG to DX1, SVG to OM, SVG to distal RCA (Dr. Roxan Hockey)  . ESOPHAGOGASTRODUODENOSCOPY  11/07/2007   [id number] Schatzki's ring.  Otherwise normal esophagus   . EYE SURGERY Left Aug. 2015   torned retina repair  . INSERT / REPLACE / REMOVE PACEMAKER    . JOINT REPLACEMENT Right   . NM MYOCAR PERF WALL MOTION  2011   dipyridamole myoview - moderate ischemai in mid anteroseptal basal inferior inferioer, mid lateral and basal inferolateral region; abnormal study, EF 41%; high risk scan  . PACEMAKER INSERTION  10/24/2007   MDT EnRhythm implanted by Dr Rollene Fare  . TONSILLECTOMY     age 24  . TOTAL KNEE ARTHROPLASTY Right    2012  . TOTAL KNEE ARTHROPLASTY Left 03/19/2015   Procedure: LEFT TOTAL KNEE ARTHROPLASTY;  Surgeon: Latanya Maudlin, MD;  Location: [facility];  Service: Orthopedics;  Laterality: Left;  . TRANSTHORACIC ECHOCARDIOGRAM  08/2012   EF 55-65%, mod conc hypertrophy; mild  AV regurg; calcified MV annulus with mod MR; LA mod diated; RV systolic pressure increased; RA mildly dilated; PA peak pressure 72mmHg  . TUBAL LIGATION      There were no vitals filed for this visit.                  Wound Therapy - 07/05/16 1431    Subjective "they bumped my leg twice with the bed and it is killing me" pt referring to her Rt LE.  STates it is hurting badly today   Patient and Family Stated Goals Wounds to finally heal    Date of Onset 06/01/15   Prior Treatments multiple    Pain Assessment Faces   Faces Pain Scale Hurts whole  lot   Pain Location Leg   Pain Orientation Right   Pain Descriptors / Indicators Constant   Pain Intervention(s) Distraction   Evaluation and Treatment Procedures Explained to Patient/Family Yes   Evaluation and Treatment Procedures Patient unable to consent due to mental status   Wound Properties Date First Assessed: 05/30/16 Wound Type: Other (Comment) Location: Leg Wound Description (Comments): Lt anterior superior wound  Present on Admission: Yes   Dressing Type Compression wrap;Silver hydrofiber   Dressing Changed Changed   Dressing Status Old drainage   Dressing Change Frequency PRN   Site / Wound Assessment Friable;Painful;Yellow   % Wound base Red or Granulating 70%   % Wound base Yellow/Fibrinous Exudate 30%   Peri-wound Assessment Edema   Margins Unattached edges (unapproximated)   Drainage Amount Minimal   Drainage Description Serosanguineous   Treatment Cleansed;Debridement (Selective)   Wound Properties Date First Assessed: 05/30/16 Time First Assessed: 1000 Wound Type: Other (Comment) Location: Leg Location Orientation: Left Wound Description (Comments): middle anterior wound    Dressing Type Compression wrap;Impregnated gauze (bismuth)   Dressing Status Old drainage   Dressing Change Frequency PRN   Site / Wound Assessment Painful;Red   % Wound base Red or Granulating 100%  was 5   % Wound base Yellow/Fibrinous Exudate 0%  was 95   Peri-wound Assessment Edema   Drainage Amount Scant   Drainage Description Serosanguineous   Treatment Cleansed;Debridement (Selective)   Wound Properties Date First Assessed: 05/30/16 Time First Assessed: 1000 Location: Leg Location Orientation: Left Wound Description (Comments): inferior anterior wound    Dressing Type Compression wrap;Impregnated gauze (bismuth)   Dressing Changed Changed   Dressing Status Old drainage   Dressing Change Frequency PRN   Site / Wound Assessment Painful;Red   % Wound base Red or Granulating 100%  was  5%   % Wound base Yellow/Fibrinous Exudate 0%  was 95%   Peri-wound Assessment Edema   Drainage Amount Scant  was moderate    Drainage Description Serosanguineous   Treatment Cleansed;Debridement (Selective)   Wound Properties Date First Assessed: 05/30/16 Time First Assessed: 1000 Wound Type: Other (Comment) Location: Leg Location Orientation: Right;Lateral Wound Description (Comments): Superior of the two lateral wounds  Present on Admission: Yes   Dressing Type Compression wrap;Silver dressings   Dressing Changed Changed   Dressing Status Intact   Dressing Change Frequency PRN   Site / Wound Assessment Painful;Yellow   % Wound base Red or Granulating 75%  was 0   % Wound base Yellow/Fibrinous Exudate 25%  was 100   Peri-wound Assessment Edema   Drainage Amount Moderate   Drainage Description Serosanguineous   Treatment Cleansed;Debridement (Selective)   Wound Properties Date First Assessed: 05/30/16 Time First Assessed: 1015 Wound Type: Other (  Comment) Location: Leg Location Orientation: Right;Lateral Wound Description (Comments): More distal of the two lateral wounds  Present on Admission: Yes   Dressing Type Compression wrap;Silver hydrofiber   Dressing Status Old drainage   Dressing Change Frequency PRN   Site / Wound Assessment Painful;Yellow   % Wound base Red or Granulating 10%  was 0   % Wound base Yellow/Fibrinous Exudate 90%  was 100   Drainage Amount Copious   Drainage Description Serosanguineous   Treatment Cleansed;Debridement (Selective)   Wound Properties Date First Assessed: 05/30/16 Time First Assessed: 1015 Wound Type: Other (Comment) Location: Leg Location Orientation: Right;Medial Present on Admission: Yes   Dressing Type Compression wrap;Silver hydrofiber   Dressing Status Old drainage   Dressing Change Frequency PRN   Site / Wound Assessment Painful   % Wound base Red or Granulating 40%  was 40   % Wound base Yellow/Fibrinous Exudate 60%   % Wound  base Black/Eschar --  was 100   Drainage Amount Moderate   Drainage Description Serosanguineous   Treatment Cleansed;Debridement (Selective)   Wound Properties Date First Assessed: 05/30/16 Time First Assessed: 1015 Location: Leg Location Orientation: Posterior Wound Description (Comments): There are four seperate wounds very close together that spans 10x10 cm  Present on Admission: Yes   Dressing Type Compression wrap;Silver hydrofiber   Dressing Changed Changed   Dressing Status Old drainage   Dressing Change Frequency PRN   Site / Wound Assessment Painful;Yellow   % Wound base Red or Granulating 10%   % Wound base Yellow/Fibrinous Exudate 90%   Peri-wound Assessment Edema   Drainage Amount Copious   Drainage Description Serosanguineous   Treatment Cleansed;Debridement (Selective)   Selective Debridement - Location wound bed   Selective Debridement - Tools Used Forceps;Scalpel;Scissors   Selective Debridement - Tissue Removed slough    Wound Therapy - Clinical Statement Lt LE is doing well and continues to approximate.  Rt LE appears worse today with increased drainage, redness, pain and thick adherent eschar.  Attempted debridement, however patient unable to tolerate.  Very tender to touch with massage and moisturizing of this LE as well.  changed dressing to silver hydrofiber and recommended antibiotic and topical debriding cream (i.e. santyl)  Progress note completed as well as verbally relayed to nurse accompanying patient.  Pt tolerated debridement and massage well on Lt LE.  continued with profore system, however placed on without stretch this session for comfort.    Wound Therapy - Functional Problem List difficult to walk, decreased mobility    Factors Delaying/Impairing Wound Healing Altered sensation;Diabetes Mellitus;Infection - systemic/local;Immobility;Multiple medical problems;Polypharmacy;Vascular compromise   Hydrotherapy Plan Debridement;Dressing change;Patient/family  education;Other (comment)  manual techniques and compression bandaging    Wound Therapy - Frequency Other (comment)  2x a week x 8 weeks    Wound Therapy - Current Recommendations PT   Wound Plan Total decongestive techniques debridement,  appropriate dressings with compression.  Begin applying santyl to Rt LE wounds when received.    Dressing  Rt: silver hydrofiber to all wounds   Dressing Lt: xeroform to smaller 2 wounds and silver hydrofiber to superior wound   Manual Therapy Supraclavicular, deep and superficial abdominal followed by routing fluid using inguinal/axillary anastomosis and  hip to knee anterior only  due to time constraints.  Little massage to distal LE's as very tender to the touch.                    PT Short Term Goals - 06/26/16  1620      PT SHORT TERM GOAL #1   Title Pt wounds to be 50% granulated to decrease risk of infection    Time 3   Period Weeks   Status On-going     PT SHORT TERM GOAL #2   Title Pt drainage to be minimal to decrease risk of infecton    Time 3   Period Weeks   Status On-going     PT SHORT TERM GOAL #3   Title Pt edema to be decreased by 3 cm to decrease risk of cellulitis and to be able to don socks on easier    Time 4   Period Weeks   Status On-going     PT SHORT TERM GOAL #4   Title Pt pain in both legs to have decreased to 7/10 to allow pt to transfer with moderate assistance    Time 3   Period Weeks   Status On-going           PT Long Term Goals - 06/26/16 1620      PT LONG TERM GOAL #1   Title Pt wounds to be 100% granulated to allow nursing staff to take over care of LE    Time 8   Period Weeks   Status On-going     PT LONG TERM GOAL #2   Title Pt wound size to have decreased by 50% for nursing staff to feel comfortable taking over wound care    Time 8   Period Weeks   Status On-going     PT LONG TERM GOAL #3   Title Pt to have obtained compression garments,(most likely [id number])  for both LE and  nursing staff to be donning and doffing    Time 8   Period Weeks   Status On-going     PT LONG TERM GOAL #4   Title Pt pain to be decreased to no greater than a 5/10 to allow pt to ambulate with rolling walker for five to ten minutes with supervision.    Time 8   Period Weeks   Status On-going             Patient will benefit from skilled therapeutic intervention in order to improve the following deficits and impairments:     Visit Diagnosis: Pain in right lower leg  Lymphedema, not elsewhere classified  Varicose veins of left lower extremity with both ulcer of other part of lower extremity and inflammation (HCC)  Varicose veins of right lower extremity with both ulcer of other part of lower extremity and inflammation (HCC)  Difficulty in walking, not elsewhere classified     Problem List Patient Active Problem List   Diagnosis Date Noted  . Acute on chronic heart failure ([place]) 12/31/2015  . Acute on chronic diastolic CHF (congestive heart failure) ([place]) 12/30/2015  . CKD (chronic kidney disease), stage III 12/30/2015  . Insulin dependent diabetes mellitus ([place]) 12/30/2015  . Essential hypertension 12/30/2015  . Hypothyroidism 12/30/2015  . Open knee wound 10/04/2015  . History of total knee arthroplasty 03/19/2015  . Aftercare following surgery of the circulatory system, [place] 04/24/2014  . Symptomatic bradycardia 04/17/2014  . Peripheral vascular disease, unspecified 01/23/2014  . Venous stasis ulcer ([place]) 10/17/2013  . Atherosclerosis of native arteries of the extremities with ulceration(440.23) 08/29/2013  . Colles' fracture of left radius 08/19/2013  . Wrist fracture, left 06/13/2013  . Fall 06/13/2013  . Acute respiratory failure with hypoxia ([place]) 06/12/2013  . Visual hallucinations  06/12/2013  . DM (diabetes mellitus), type 2 ([place]) 06/11/2013  . Acute on chronic diastolic heart failure ([place]) 06/11/2013  . Morbid obesity ([place]) 06/11/2013  . ARF (acute  renal failure) ([place]) 06/07/2013  . Cellulitis and abscess of leg 06/05/2013  . CHF exacerbation ([place]) 11/19/2012  . Chronic lower GI bleeding 11/19/2012  . Type II or unspecified type diabetes mellitus without mention of complication, not stated as uncontrolled 11/19/2012  . Chronic kidney disease (CKD), stage III (moderate) 11/19/2012  . Permanent atrial fibrillation ([place]) 11/15/2012  . Long term (current) use of anticoagulants 11/15/2012  . Anemia     Teena Irani, PTA/CLT [phone]  07/05/2016, 2:46 PM  [place] [place] [address] [place], [place], [address] Phone: [phone]   Fax:  [phone]  Name: [name] MRN: [id number] Date of Birth: [birth year]

## 2016-07-12 ENCOUNTER — Ambulatory Visit (HOSPITAL_COMMUNITY): Payer: Medicare HMO | Admitting: Physical Therapy

## 2016-07-12 DIAGNOSIS — R262 Difficulty in walking, not elsewhere classified: Secondary | ICD-10-CM

## 2016-07-12 DIAGNOSIS — L97819 Non-pressure chronic ulcer of other part of right lower leg with unspecified severity: Secondary | ICD-10-CM

## 2016-07-12 DIAGNOSIS — M79662 Pain in left lower leg: Secondary | ICD-10-CM

## 2016-07-12 DIAGNOSIS — M79661 Pain in right lower leg: Secondary | ICD-10-CM | POA: Diagnosis not present

## 2016-07-12 DIAGNOSIS — L97829 Non-pressure chronic ulcer of other part of left lower leg with unspecified severity: Secondary | ICD-10-CM

## 2016-07-12 DIAGNOSIS — I83228 Varicose veins of left lower extremity with both ulcer of other part of lower extremity and inflammation: Secondary | ICD-10-CM

## 2016-07-12 DIAGNOSIS — I890 Lymphedema, not elsewhere classified: Secondary | ICD-10-CM

## 2016-07-12 DIAGNOSIS — I83218 Varicose veins of right lower extremity with both ulcer of other part of lower extremity and inflammation: Secondary | ICD-10-CM

## 2016-07-12 NOTE — Therapy (Addendum)
[place], [place], [address] Phone: [phone]   Fax:  [phone]  Physical Therapy Treatment  Patient Details  Name: [name] MRN: [id number] Date of Birth: Aug 24, 1932 Referring Provider: Neale Burly  Encounter Date: 07/12/2016      PT End of Session - 07/12/16 1407    Visit Number 10   Number of Visits 16   Date for PT Re-Evaluation 06/29/16   Authorization Type [employer] medicare   Authorization - Visit Number 10   Authorization - Number of Visits 10   PT Start Time [id number]   PT Stop Time 1200   PT Time Calculation (min) 80 min   Activity Tolerance Patient tolerated treatment well   Behavior During Therapy [facility] for tasks assessed/performed      Past Medical History:  Diagnosis Date  . Acute on chronic congestive heart failure with left ventricular diastolic dysfunction ([place]) 06/11/2013  . Acute respiratory failure with hypoxia ([place]) 06/12/2013   secondary to acute on chronic diastolic failure.  . Anemia 2008  . Arthritis   . CAD (coronary artery disease)   . Cataract    bilaterla   . CRI (chronic renal insufficiency) 2008  . DM (diabetes mellitus) ([place]) 1999  . Fracture November 2014   Left wrist-03-12-15 no issues now  . GERD (gastroesophageal reflux disease)   . Hx of CABG 10/24/2007   Dr. Roxan Hockey  . Hyperlipemia   . Hypertension   . Obesity   . Pacemaker 10/24/2007   Medtronic EnRhythm; implanted for SSS, symptomatic bradycardia, PAF  . Permanent atrial fibrillation ([place])   . S/P CABG x 4   . Transfusion history   . Venous insufficiency    8-12-16left lower lateral outside healed leg ulcer-scabbed over.  . Wrist fracture, left 06/13/2013   03-12-15 no residual issues    Past Surgical History:  Procedure Laterality Date  . APPENDECTOMY     APPENDICITIS  . BACK SURGERY    . CARDIAC CATHETERIZATION  08/08/2007   3 vessel CAD (LAD, mid Cfx, mid RCA) (Dr. Marella Chimes)  .  CATARACT EXTRACTION, BILATERAL    . COLONOSCOPY WITH ESOPHAGOGASTRODUODENOSCOPY (EGD) N/A 11/25/2012   SLF:Two SMALL AC polyps/Small internal hemorrhoids/The colon IS SLIGHTLY redundant-EGD:Schatzki ring at the gastroesophageal junction/Small hiatal hernia/MILD Non-erosive gastritis  . CORONARY ANGIOPLASTY  11/16/2003   PCI to prox mid RCA (2.75x52mm chromium cobalt vision Guidant stent) (Dr. Marella Chimes)  . CORONARY ARTERY BYPASS GRAFT  10/24/2007   LIMA to LAD, SVG to DX1, SVG to OM, SVG to distal RCA (Dr. Roxan Hockey)  . ESOPHAGOGASTRODUODENOSCOPY  11/07/2007   [id number] Schatzki's ring.  Otherwise normal esophagus   . EYE SURGERY Left Aug. 2015   torned retina repair  . INSERT / REPLACE / REMOVE PACEMAKER    . JOINT REPLACEMENT Right   . NM MYOCAR PERF WALL MOTION  2011   dipyridamole myoview - moderate ischemai in mid anteroseptal basal inferior inferioer, mid lateral and basal inferolateral region; abnormal study, EF 41%; high risk scan  . PACEMAKER INSERTION  10/24/2007   MDT EnRhythm implanted by Dr Rollene Fare  . TONSILLECTOMY     age 16  . TOTAL KNEE ARTHROPLASTY Right    2012  . TOTAL KNEE ARTHROPLASTY Left 03/19/2015   Procedure: LEFT TOTAL KNEE ARTHROPLASTY;  Surgeon: Latanya Maudlin, MD;  Location: [facility];  Service: Orthopedics;  Laterality: Left;  . TRANSTHORACIC ECHOCARDIOGRAM  08/2012   EF 55-65%, mod conc hypertrophy; mild  AV regurg; calcified MV annulus with mod MR; LA mod diated; RV systolic pressure increased; RA mildly dilated; PA peak pressure 48mmHg  . TUBAL LIGATION      There were no vitals filed for this visit.                     Wound Therapy - 07/12/16 1304    Subjective Pt states her legs and but are killing her.   Patient and Family Stated Goals Wounds to finally heal    Date of Onset 06/01/15   Prior Treatments multiple    Pain Assessment Faces   Faces Pain Scale Hurts whole lot   Evaluation and Treatment Procedures Explained to  Patient/Family Yes   Evaluation and Treatment Procedures Patient unable to consent due to mental status   Wound Properties Date First Assessed: 05/30/16 Wound Type: Other (Comment) Location: Leg Wound Description (Comments): Lt anterior superior wound  Present on Admission: Yes   Dressing Type Compression wrap;Silver hydrofiber   Dressing Changed Changed   Dressing Status Old drainage   Dressing Change Frequency PRN   Site / Wound Assessment Friable;Painful;Yellow   % Wound base Red or Granulating 70%   % Wound base Yellow/Fibrinous Exudate 30%   Peri-wound Assessment Edema   Wound Length (cm) 5.8 cm  was 6cm, on 11/27   Wound Width (cm) 4.6 cm  was 5 cm on 11/27   Wound Depth (cm) 0.3 cm  unchanged from 0.3cm   Margins Unattached edges (unapproximated)   Drainage Amount Minimal   Drainage Description Serosanguineous   Treatment Cleansed;Debridement (Selective)   Wound Properties Date First Assessed: 05/30/16 Time First Assessed: 1000 Wound Type: Other (Comment) Location: Leg Location Orientation: Left Wound Description (Comments): middle anterior wound  Final Assessment Date: 07/12/16   Dressing Type --   Dressing Status --   Dressing Change Frequency --   Site / Wound Assessment --   % Wound base Red or Granulating --   % Wound base Yellow/Fibrinous Exudate --   Peri-wound Assessment --   Drainage Amount --   Drainage Description --   Treatment --   Wound Properties Date First Assessed: 05/30/16 Time First Assessed: 1000 Location: Leg Location Orientation: Left Wound Description (Comments): inferior anterior wound  Final Assessment Date: 07/12/16   Dressing Type --   Dressing Status --   Dressing Change Frequency --   Site / Wound Assessment --   % Wound base Red or Granulating --   % Wound base Yellow/Fibrinous Exudate --   Peri-wound Assessment --   Drainage Amount --   Drainage Description --   Wound Properties Date First Assessed: 05/30/16 Time First Assessed: 1000  Wound Type: Other (Comment) Location: Leg Location Orientation: Right;Lateral Wound Description (Comments): Superior of the two lateral wounds  Present on Admission: Yes   Dressing Type Compression wrap;Silver dressings   Dressing Changed Changed   Dressing Status Intact   Dressing Change Frequency PRN   Site / Wound Assessment Painful;Yellow   % Wound base Red or Granulating 75%  was 0   % Wound base Yellow/Fibrinous Exudate 25%  was 100   Peri-wound Assessment Edema   Wound Length (cm) 1.5 cm  was 1.5   Wound Width (cm) 1.2 cm  was 1   Drainage Amount Moderate   Drainage Description Serosanguineous   Treatment Cleansed;Debridement (Selective)   Wound Properties Date First Assessed: 05/30/16 Time First Assessed: 1015 Wound Type: Other (Comment) Location: Leg Location Orientation: Right;Lateral Wound  Description (Comments): More distal of the two lateral wounds  Present on Admission: Yes   Dressing Type Compression wrap;Silver hydrofiber   Dressing Changed Changed   Dressing Status Old drainage   Dressing Change Frequency PRN   Site / Wound Assessment Painful;Yellow   % Wound base Red or Granulating 10%  was 0   % Wound base Yellow/Fibrinous Exudate 90%  was 100   Wound Length (cm) 4.7 cm  was 4 cm   Wound Width (cm) 4 cm  was 4 cm   Drainage Amount Copious   Drainage Description Serosanguineous   Treatment Cleansed;Debridement (Selective)   Wound Properties Date First Assessed: 05/30/16 Time First Assessed: 1015 Wound Type: Other (Comment) Location: Leg Location Orientation: Right;Medial Present on Admission: Yes   Dressing Type Compression wrap;Silver hydrofiber   Dressing Changed Changed   Dressing Status Old drainage   Dressing Change Frequency PRN   Site / Wound Assessment Painful   % Wound base Red or Granulating 40%  was 40   % Wound base Yellow/Fibrinous Exudate 60%   % Wound base Black/Eschar --  was 100   Wound Length (cm) 4.2 cm  was 4 cm   Wound Width (cm)  2.1 cm  was 3.5 cm   Drainage Amount Moderate   Drainage Description Serosanguineous   Treatment Cleansed;Debridement (Selective)   Wound Properties Date First Assessed: 05/30/16 Time First Assessed: 1015 Location: Leg Location Orientation: Posterior Wound Description (Comments): There are four seperate wounds very close together that spans 10x10 cm  Present on Admission: Yes   Dressing Type Compression wrap;Silver hydrofiber   Dressing Changed Changed   Dressing Status Old drainage   Dressing Change Frequency PRN   Site / Wound Assessment Painful;Yellow   % Wound base Red or Granulating 10%   % Wound base Yellow/Fibrinous Exudate 90%   Peri-wound Assessment Edema   Wound Length (cm) 12.5 cm  was 10 cm   Wound Width (cm) 8.5 cm  was 5 cm   Drainage Amount Copious   Drainage Description Serosanguineous   Treatment Cleansed;Debridement (Selective)   Selective Debridement - Location wound bed   Selective Debridement - Tools Used Forceps;Scalpel;Scissors   Selective Debridement - Tissue Removed slough    Wound Therapy - Clinical Statement Worsening of Rt LE this session.  Increased drainage, erythema and eschar.  Lt LE with overall reduced size and smaller two wounds are now healed.  Rt LE with increased size and irritation.  Called and spoke to nurse case manager Opal Sidles) regarding concerns.  Requested a wound culture (pt has been on cipro since 12/7) and santyl for topical wound debridement.  Pt is very intolerant of sharps debridement secondary to pain.     Wound Therapy - Functional Problem List difficult to walk, decreased mobility    Factors Delaying/Impairing Wound Healing Altered sensation;Diabetes Mellitus;Infection - systemic/local;Immobility;Multiple medical problems;Polypharmacy;Vascular compromise   Hydrotherapy Plan Debridement;Dressing change;Patient/family education;Other (comment)  manual techniques and compression bandaging    Wound Therapy - Frequency Other (comment)  2x a  week x 8 weeks    Wound Therapy - Current Recommendations PT   Wound Plan Total decongestive techniques debridement,  appropriate dressings with compression.  Begin applying santyl to Rt LE wounds when received.    Dressing  Rt: silver hydrofiber to all wounds   Dressing Lt: xeroform to smaller 2 wounds and silver hydrofiber to superior wound   Manual Therapy Supraclavicular, deep and superficial abdominal followed by routing fluid using inguinal/axillary anastomosis and  hip  to knee anterior only  due to time constraints.  Little massage to distal LE's as very tender to the touch.                     PT Short Term Goals - 06/26/16 1620      PT SHORT TERM GOAL #1   Title Pt wounds to be 50% granulated to decrease risk of infection    Time 3   Period Weeks   Status On-going     PT SHORT TERM GOAL #2   Title Pt drainage to be minimal to decrease risk of infecton    Time 3   Period Weeks   Status On-going     PT SHORT TERM GOAL #3   Title Pt edema to be decreased by 3 cm to decrease risk of cellulitis and to be able to don socks on easier    Time 4   Period Weeks   Status On-going     PT SHORT TERM GOAL #4   Title Pt pain in both legs to have decreased to 7/10 to allow pt to transfer with moderate assistance    Time 3   Period Weeks   Status On-going           PT Long Term Goals - 06/26/16 1620      PT LONG TERM GOAL #1   Title Pt wounds to be 100% granulated to allow nursing staff to take over care of LE    Time 8   Period Weeks   Status On-going     PT LONG TERM GOAL #2   Title Pt wound size to have decreased by 50% for nursing staff to feel comfortable taking over wound care    Time 8   Period Weeks   Status On-going     PT LONG TERM GOAL #3   Title Pt to have obtained compression garments,(most likely [id number])  for both LE and nursing staff to be donning and doffing    Time 8   Period Weeks   Status On-going     PT LONG TERM GOAL #4   Title Pt  pain to be decreased to no greater than a 5/10 to allow pt to ambulate with rolling walker for five to ten minutes with supervision.    Time 8   Period Weeks   Status On-going       [id number] CL [phone] CK      Patient will benefit from skilled therapeutic intervention in order to improve the following deficits and impairments:     Visit Diagnosis: Pain in right lower leg  Lymphedema, not elsewhere classified  Varicose veins of left lower extremity with both ulcer of other part of lower extremity and inflammation (HCC)  Varicose veins of right lower extremity with both ulcer of other part of lower extremity and inflammation (HCC)  Difficulty in walking, not elsewhere classified  Pain in left lower leg     Problem List Patient Active Problem List   Diagnosis Date Noted  . Acute on chronic heart failure ([place]) 12/31/2015  . Acute on chronic diastolic CHF (congestive heart failure) ([place]) 12/30/2015  . CKD (chronic kidney disease), stage III 12/30/2015  . Insulin dependent diabetes mellitus ([place]) 12/30/2015  . Essential hypertension 12/30/2015  . Hypothyroidism 12/30/2015  . Open knee wound 10/04/2015  . History of total knee arthroplasty 03/19/2015  . Aftercare following surgery of the circulatory system, [place] 04/24/2014  . Symptomatic bradycardia 04/17/2014  .  Peripheral vascular disease, unspecified 01/23/2014  . Venous stasis ulcer ([place]) 10/17/2013  . Atherosclerosis of native arteries of the extremities with ulceration(440.23) 08/29/2013  . Colles' fracture of left radius 08/19/2013  . Wrist fracture, left 06/13/2013  . Fall 06/13/2013  . Acute respiratory failure with hypoxia ([place]) 06/12/2013  . Visual hallucinations 06/12/2013  . DM (diabetes mellitus), type 2 ([place]) 06/11/2013  . Acute on chronic diastolic heart failure ([place]) 06/11/2013  . Morbid obesity ([place]) 06/11/2013  . ARF (acute renal failure) ([place]) 06/07/2013  . Cellulitis and abscess of leg 06/05/2013  .  CHF exacerbation ([place]) 11/19/2012  . Chronic lower GI bleeding 11/19/2012  . Type II or unspecified type diabetes mellitus without mention of complication, not stated as uncontrolled 11/19/2012  . Chronic kidney disease (CKD), stage III (moderate) 11/19/2012  . Permanent atrial fibrillation ([place]) 11/15/2012  . Long term (current) use of anticoagulants 11/15/2012  . Anemia     Teena Irani 07/12/2016, 2:16 PM Rayetta Humphrey, PT CLT [place] [address] [place], [place], [address] Phone: [phone]   Fax:  [phone]  Name: [name] MRN: [id number] Date of Birth: 1933-07-24  04/12/2018  Pt did not return for further visits due to change in medical status.  See above for status at discharge.  Rayetta Humphrey, [place] CLT [phone]

## 2016-07-14 ENCOUNTER — Ambulatory Visit (HOSPITAL_COMMUNITY): Payer: Medicare HMO

## 2016-07-18 ENCOUNTER — Telehealth (HOSPITAL_COMMUNITY): Payer: Self-pay | Admitting: Internal Medicine

## 2016-07-18 ENCOUNTER — Encounter: Payer: Self-pay | Admitting: *Deleted

## 2016-07-18 NOTE — Telephone Encounter (Signed)
[facility] call to cancel all of patient's appt's

## 2016-07-18 NOTE — Telephone Encounter (Signed)
[facility] called to cancel all of the patient's appt's

## 2016-07-19 ENCOUNTER — Ambulatory Visit (HOSPITAL_COMMUNITY): Payer: Medicare HMO | Admitting: Physical Therapy

## 2016-07-21 ENCOUNTER — Ambulatory Visit (HOSPITAL_COMMUNITY): Payer: Medicare HMO

## 2016-07-27 ENCOUNTER — Ambulatory Visit: Payer: Medicare HMO | Admitting: Cardiovascular Disease

## 2016-08-01 ENCOUNTER — Ambulatory Visit (HOSPITAL_COMMUNITY): Payer: Medicare HMO | Admitting: Physical Therapy

## 2016-08-03 ENCOUNTER — Ambulatory Visit (HOSPITAL_COMMUNITY): Payer: Medicare HMO | Admitting: Physical Therapy

## 2016-08-08 ENCOUNTER — Ambulatory Visit (HOSPITAL_COMMUNITY): Payer: Medicare HMO | Admitting: Physical Therapy

## 2016-08-10 ENCOUNTER — Ambulatory Visit (HOSPITAL_COMMUNITY): Payer: Medicare HMO | Admitting: Physical Therapy

## 2016-08-31 DEATH — deceased

## 2016-11-17 ENCOUNTER — Ambulatory Visit: Payer: Medicare HMO | Admitting: Family

## 2016-11-17 ENCOUNTER — Encounter (HOSPITAL_COMMUNITY): Payer: Medicare HMO
